# Patient Record
Sex: Male | Born: 1945
Health system: Southern US, Community
[De-identification: ages and names within clinical notes are randomized; demographics above are authoritative.]

## PROBLEM LIST (undated history)

## (undated) DIAGNOSIS — K219 Gastro-esophageal reflux disease without esophagitis: Secondary | ICD-10-CM

## (undated) DIAGNOSIS — G4730 Sleep apnea, unspecified: Secondary | ICD-10-CM

## (undated) DIAGNOSIS — D649 Anemia, unspecified: Secondary | ICD-10-CM

## (undated) DIAGNOSIS — R0600 Dyspnea, unspecified: Secondary | ICD-10-CM

## (undated) DIAGNOSIS — Z9989 Dependence on other enabling machines and devices: Secondary | ICD-10-CM

## (undated) DIAGNOSIS — I5189 Other ill-defined heart diseases: Secondary | ICD-10-CM

## (undated) DIAGNOSIS — I4819 Other persistent atrial fibrillation: Secondary | ICD-10-CM

## (undated) DIAGNOSIS — I872 Venous insufficiency (chronic) (peripheral): Secondary | ICD-10-CM

## (undated) DIAGNOSIS — I959 Hypotension, unspecified: Secondary | ICD-10-CM

## (undated) DIAGNOSIS — I10 Essential (primary) hypertension: Secondary | ICD-10-CM

## (undated) DIAGNOSIS — E662 Morbid (severe) obesity with alveolar hypoventilation: Secondary | ICD-10-CM

## (undated) DIAGNOSIS — M1990 Unspecified osteoarthritis, unspecified site: Secondary | ICD-10-CM

## (undated) HISTORY — DX: Anemia, unspecified: D64.9

## (undated) HISTORY — PX: HERNIA REPAIR: SHX51

## (undated) HISTORY — PX: COLONOSCOPY WITH ESOPHAGOGASTRODUODENOSCOPY (EGD): SHX5779

## (undated) MED FILL — Iron Sucrose Inj 20 MG/ML (Fe Equiv): INTRAVENOUS | Qty: 10 | Status: AC

---

## 1979-06-24 DIAGNOSIS — Z8719 Personal history of other diseases of the digestive system: Secondary | ICD-10-CM | POA: Insufficient documentation

## 2003-05-30 ENCOUNTER — Other Ambulatory Visit: Payer: Self-pay

## 2011-04-23 ENCOUNTER — Encounter (INDEPENDENT_AMBULATORY_CARE_PROVIDER_SITE_OTHER): Payer: 59 | Admitting: Ophthalmology

## 2011-04-23 DIAGNOSIS — H2510 Age-related nuclear cataract, unspecified eye: Secondary | ICD-10-CM

## 2011-04-23 DIAGNOSIS — H3530 Unspecified macular degeneration: Secondary | ICD-10-CM

## 2011-04-23 DIAGNOSIS — H43819 Vitreous degeneration, unspecified eye: Secondary | ICD-10-CM

## 2012-04-26 ENCOUNTER — Encounter (INDEPENDENT_AMBULATORY_CARE_PROVIDER_SITE_OTHER): Payer: 59 | Admitting: Ophthalmology

## 2014-08-25 DIAGNOSIS — M170 Bilateral primary osteoarthritis of knee: Secondary | ICD-10-CM | POA: Diagnosis not present

## 2014-08-25 DIAGNOSIS — I872 Venous insufficiency (chronic) (peripheral): Secondary | ICD-10-CM | POA: Diagnosis not present

## 2014-08-25 DIAGNOSIS — M25569 Pain in unspecified knee: Secondary | ICD-10-CM | POA: Diagnosis not present

## 2014-09-26 DIAGNOSIS — G4730 Sleep apnea, unspecified: Secondary | ICD-10-CM | POA: Diagnosis not present

## 2014-09-26 DIAGNOSIS — K210 Gastro-esophageal reflux disease with esophagitis: Secondary | ICD-10-CM | POA: Diagnosis not present

## 2014-09-26 DIAGNOSIS — M25569 Pain in unspecified knee: Secondary | ICD-10-CM | POA: Diagnosis not present

## 2014-09-26 DIAGNOSIS — M179 Osteoarthritis of knee, unspecified: Secondary | ICD-10-CM | POA: Diagnosis not present

## 2014-10-09 DIAGNOSIS — H2513 Age-related nuclear cataract, bilateral: Secondary | ICD-10-CM | POA: Diagnosis not present

## 2015-01-03 DIAGNOSIS — I8310 Varicose veins of unspecified lower extremity with inflammation: Secondary | ICD-10-CM | POA: Diagnosis not present

## 2015-01-03 DIAGNOSIS — H3530 Unspecified macular degeneration: Secondary | ICD-10-CM | POA: Diagnosis not present

## 2015-01-03 DIAGNOSIS — G4730 Sleep apnea, unspecified: Secondary | ICD-10-CM | POA: Diagnosis not present

## 2015-01-03 DIAGNOSIS — E668 Other obesity: Secondary | ICD-10-CM | POA: Diagnosis not present

## 2015-02-22 ENCOUNTER — Ambulatory Visit (INDEPENDENT_AMBULATORY_CARE_PROVIDER_SITE_OTHER): Payer: Medicare Other | Admitting: Podiatry

## 2015-02-22 ENCOUNTER — Encounter: Payer: Self-pay | Admitting: Podiatry

## 2015-02-22 VITALS — BP 109/74 | HR 89 | Resp 18

## 2015-02-22 DIAGNOSIS — M79676 Pain in unspecified toe(s): Secondary | ICD-10-CM | POA: Diagnosis not present

## 2015-02-22 DIAGNOSIS — B351 Tinea unguium: Secondary | ICD-10-CM

## 2015-02-23 ENCOUNTER — Encounter: Payer: Self-pay | Admitting: Podiatry

## 2015-02-23 NOTE — Progress Notes (Signed)
Subjective:     Patient ID: Ernest [name], male   DOB: Jul 18, 1945, 69 y.o.   MRN: [id number]  HPI Subjective: 69 y.o. presents to the office today for painful, elongated, thickened toenails which he is unable to trim himself. Denies any redness or drainage around the nails. Denies any acute changes since last appointment and no new complaints today. Denies any systemic complaints such as fevers, chills, nausea, vomiting.    Review of Systems     Objective:   Physical Exam AAO 3, NAD DP/PT pulses palpable, CRT less than 3 seconds Protective sensation intact with Simms Weinstein monofilament, Achilles tendon reflex intact.  Nails hypertrophic, dystrophic, elongated, brittle, discolored 10. There is tenderness overlying the nails 1-5 bilaterally. There is no surrounding erythema or drainage along the nail sites. No open lesions or pre-ulcerative lesions are identified. No other areas of tenderness bilateral lower extremities. No overlying edema, erythema, increased warmth. No pain with calf compression, swelling, warmth, erythema.    Assessment:     Symptomatic onychomycosis     Plan:     Treatment options including alternatives, risks, complications were discussed -Nails sharply debrided 10 without complication/bleeding. -Discussed treatment options for onychomycosis. He states that he would like to start with over-the-counter treatment. I discussed fungi-nail.  -Discussed daily foot inspection. If there are any changes, to call the office immediately.  -Follow-up in 3 months or sooner if any problems are to arise. In the meantime, encouraged to call the office with any questions, concerns, changes symptoms.  Celesta Gentile, DPM

## 2015-04-03 DIAGNOSIS — G4730 Sleep apnea, unspecified: Secondary | ICD-10-CM | POA: Diagnosis not present

## 2015-04-03 DIAGNOSIS — K210 Gastro-esophageal reflux disease with esophagitis: Secondary | ICD-10-CM | POA: Diagnosis not present

## 2015-04-03 DIAGNOSIS — E668 Other obesity: Secondary | ICD-10-CM | POA: Diagnosis not present

## 2015-04-03 DIAGNOSIS — I8310 Varicose veins of unspecified lower extremity with inflammation: Secondary | ICD-10-CM | POA: Diagnosis not present

## 2015-04-03 DIAGNOSIS — Z23 Encounter for immunization: Secondary | ICD-10-CM | POA: Diagnosis not present

## 2015-04-17 DIAGNOSIS — G4730 Sleep apnea, unspecified: Secondary | ICD-10-CM | POA: Diagnosis not present

## 2015-04-17 DIAGNOSIS — M76892 Other specified enthesopathies of left lower limb, excluding foot: Secondary | ICD-10-CM | POA: Diagnosis not present

## 2015-04-17 DIAGNOSIS — K210 Gastro-esophageal reflux disease with esophagitis: Secondary | ICD-10-CM | POA: Diagnosis not present

## 2015-04-17 DIAGNOSIS — I872 Venous insufficiency (chronic) (peripheral): Secondary | ICD-10-CM | POA: Diagnosis not present

## 2015-05-24 ENCOUNTER — Ambulatory Visit (INDEPENDENT_AMBULATORY_CARE_PROVIDER_SITE_OTHER): Payer: Medicare Other | Admitting: Podiatry

## 2015-05-24 ENCOUNTER — Encounter: Payer: Self-pay | Admitting: Podiatry

## 2015-05-24 DIAGNOSIS — M79676 Pain in unspecified toe(s): Secondary | ICD-10-CM

## 2015-05-24 DIAGNOSIS — B351 Tinea unguium: Secondary | ICD-10-CM

## 2015-05-24 NOTE — Progress Notes (Signed)
Subjective: 69 y.o. returns the office today for painful, elongated, thickened toenails which he cannot trim themself. Denies any redness or drainage around the nails. Denies any acute changes since last appointment and no new complaints today. Denies any systemic complaints such as fevers, chills, nausea, vomiting.   Objective: AAO 3, NAD DP/PT pulses palpable, CRT less than 3 seconds Nails hypertrophic, dystrophic, elongated, brittle, discolored 10. There is tenderness overlying the nails 1-5 bilaterally. There is no surrounding erythema or drainage along the nail sites. No open lesions or pre-ulcerative lesions are identified. No other areas of tenderness bilateral lower extremities. No overlying edema, erythema, increased warmth. No pain with calf compression, swelling, warmth, erythema.  Assessment: Patient presents with symptomatic onychomycosis  Plan: -Treatment options including alternatives, risks, complications were discussed -Nails sharply debrided 10 without complication/bleeding. -Discussed daily foot inspection. If there are any changes, to call the office immediately.  -Follow-up in 3 months or sooner if any problems are to arise. In the meantime, encouraged to call the office with any questions, concerns, changes symptoms.  Celesta Gentile, DPM

## 2015-05-29 DIAGNOSIS — Z0000 Encounter for general adult medical examination without abnormal findings: Secondary | ICD-10-CM | POA: Diagnosis not present

## 2015-05-29 DIAGNOSIS — I890 Lymphedema, not elsewhere classified: Secondary | ICD-10-CM | POA: Diagnosis not present

## 2015-06-01 DIAGNOSIS — I10 Essential (primary) hypertension: Secondary | ICD-10-CM | POA: Diagnosis not present

## 2015-06-01 DIAGNOSIS — E784 Other hyperlipidemia: Secondary | ICD-10-CM | POA: Diagnosis not present

## 2015-06-01 DIAGNOSIS — R5381 Other malaise: Secondary | ICD-10-CM | POA: Diagnosis not present

## 2015-09-25 DIAGNOSIS — I872 Venous insufficiency (chronic) (peripheral): Secondary | ICD-10-CM | POA: Diagnosis not present

## 2015-09-25 DIAGNOSIS — E668 Other obesity: Secondary | ICD-10-CM | POA: Diagnosis not present

## 2015-09-25 DIAGNOSIS — H3530 Unspecified macular degeneration: Secondary | ICD-10-CM | POA: Diagnosis not present

## 2015-09-26 ENCOUNTER — Telehealth: Payer: Self-pay | Admitting: Gastroenterology

## 2015-09-26 NOTE — Telephone Encounter (Signed)
Colonoscopy - patient doesn't see any blood in his stool but [facility] sent him a card for a sample and it came back as some blood in stool.

## 2015-09-26 NOTE — Telephone Encounter (Signed)
Please check insurance

## 2015-09-27 ENCOUNTER — Ambulatory Visit: Payer: Medicare Other | Admitting: Podiatry

## 2015-10-15 DIAGNOSIS — H353131 Nonexudative age-related macular degeneration, bilateral, early dry stage: Secondary | ICD-10-CM | POA: Diagnosis not present

## 2015-10-15 DIAGNOSIS — H2513 Age-related nuclear cataract, bilateral: Secondary | ICD-10-CM | POA: Diagnosis not present

## 2015-10-16 NOTE — Telephone Encounter (Signed)
LVM for pt to return my call.

## 2015-10-18 ENCOUNTER — Telehealth: Payer: Self-pay

## 2015-10-18 ENCOUNTER — Other Ambulatory Visit: Payer: Self-pay

## 2015-10-18 DIAGNOSIS — I872 Venous insufficiency (chronic) (peripheral): Secondary | ICD-10-CM | POA: Diagnosis not present

## 2015-10-18 DIAGNOSIS — Z1211 Encounter for screening for malignant neoplasm of colon: Secondary | ICD-10-CM

## 2015-10-18 DIAGNOSIS — H3530 Unspecified macular degeneration: Secondary | ICD-10-CM | POA: Diagnosis not present

## 2015-10-18 MED ORDER — PEG 3350-KCL-NABCB-NACL-NASULF 236 G PO SOLR: 4000.0000 mL | Freq: Once | ORAL | Status: DC

## 2015-10-18 NOTE — Telephone Encounter (Signed)
Gastroenterology Pre-Procedure Review  Request Date: 12/04/15 Requesting Physician: Dr. Lavera Guise  PATIENT REVIEW QUESTIONS: The patient responded to the following health history questions as indicated:    1. Are you having any GI issues? no 2. Do you have a personal history of Polyps? no 3. Do you have a family history of Colon Cancer or Polyps? no 4. Diabetes Mellitus? no 5. Joint replacements in the past 12 months?no 6. Major health problems in the past 3 months?no 7. Any artificial heart valves, MVP, or defibrillator?no    MEDICATIONS & ALLERGIES:    Patient reports the following regarding taking any anticoagulation/antiplatelet therapy:   Plavix, Coumadin, Eliquis, Xarelto, Lovenox, Pradaxa, Brilinta, or Effient? no Aspirin? no  Patient confirms/reports the following medications:  Current Outpatient Prescriptions  Medication Sig Dispense Refill  . amiloride-hydrochlorothiazide (MODURETIC) 5-50 MG tablet Take 1 tablet by mouth daily.  6  . Azelastine-Fluticasone (DYMISTA) 137-50 MCG/ACT SUSP Place into the nose.    Marland Kitchen BEE POLLEN PO Take by mouth.    . co-enzyme Q-10 30 MG capsule Take 30 mg by mouth 3 (three) times daily.    . Ginkgo Biloba Extract 60 MG CAPS Take by mouth.    Nyoka Cowden Coffee Bean-Yerba Mate (GREEN COFFEE BEAN EXTRACT PO) Take by mouth.    . Misc Natural Products (OSTEO BI-FLEX JOINT SHIELD PO) Take by mouth.    . Multiple Vitamins-Minerals (CENTRUM SILVER ADULT 50+ PO) Take by mouth.    . Multiple Vitamins-Minerals (PRESERVISION AREDS 2) CAPS Take by mouth.    . Omega-3 Fatty Acids (OMEGA 3 PO) Take by mouth.    . pantoprazole (PROTONIX) 40 MG tablet Take 40 mg by mouth daily.  5  . traMADol (ULTRAM) 50 MG tablet Take 50 mg by mouth 2 (two) times daily.  2   No current facility-administered medications for this visit.    Patient confirms/reports the following allergies:  No Known Allergies  No orders of the defined types were placed in this encounter.     AUTHORIZATION INFORMATION Primary Insurance: 1D#: Group #:  Secondary Insurance: 1D#: Group #:  SCHEDULE INFORMATION: Date: 12/04/15 Time: Location: [facility]

## 2015-10-19 DIAGNOSIS — R5381 Other malaise: Secondary | ICD-10-CM | POA: Diagnosis not present

## 2015-10-19 NOTE — Telephone Encounter (Signed)
See below

## 2015-10-23 DIAGNOSIS — I889 Nonspecific lymphadenitis, unspecified: Secondary | ICD-10-CM | POA: Diagnosis not present

## 2015-10-23 DIAGNOSIS — L03212 Acute lymphangitis of face: Secondary | ICD-10-CM | POA: Diagnosis not present

## 2015-11-02 ENCOUNTER — Telehealth: Payer: Self-pay | Admitting: Gastroenterology

## 2015-11-02 NOTE — Telephone Encounter (Signed)
Patient called and wants to cancel his colonoscopy. He did not give a reason and did not want to reschedule

## 2015-11-05 NOTE — Telephone Encounter (Signed)
Contacted Trish at End[facility] and cancelled appt.

## 2015-11-27 ENCOUNTER — Ambulatory Visit (INDEPENDENT_AMBULATORY_CARE_PROVIDER_SITE_OTHER): Payer: Medicare Other | Admitting: Sports Medicine

## 2015-11-27 ENCOUNTER — Ambulatory Visit: Payer: Medicare Other | Admitting: Podiatry

## 2015-11-27 DIAGNOSIS — B351 Tinea unguium: Secondary | ICD-10-CM

## 2015-11-27 DIAGNOSIS — M79676 Pain in unspecified toe(s): Secondary | ICD-10-CM | POA: Diagnosis not present

## 2015-11-27 DIAGNOSIS — I739 Peripheral vascular disease, unspecified: Secondary | ICD-10-CM

## 2015-11-28 NOTE — Progress Notes (Signed)
Patient ID: Ernest [name], male   DOB: 02/24/1946, 70 y.o.   MRN: [id number] Subjective: Ernest [name] is a 70 y.o. male patient seen today in office with complaint of painful thickened and elongated toenails; unable to trim. Patient denies history of Diabetes. 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 Patient has no other pedal complaints at this time.   There are no active problems to display for this patient.   Current Outpatient Prescriptions on File Prior to Visit  Medication Sig Dispense Refill  . amiloride-hydrochlorothiazide (MODURETIC) 5-50 MG tablet Take 1 tablet by mouth daily.  6  . Azelastine-Fluticasone (DYMISTA) 137-50 MCG/ACT SUSP Place into the nose.    Marland Kitchen BEE POLLEN PO Take by mouth.    . co-enzyme Q-10 30 MG capsule Take 30 mg by mouth 3 (three) times daily.    . Ginkgo Biloba Extract 60 MG CAPS Take by mouth.    Nyoka Cowden Coffee Bean-Yerba Mate (GREEN COFFEE BEAN EXTRACT PO) Take by mouth.    . Misc Natural Products (OSTEO BI-FLEX JOINT SHIELD PO) Take by mouth.    . Multiple Vitamins-Minerals (CENTRUM SILVER ADULT 50+ PO) Take by mouth.    . Multiple Vitamins-Minerals (PRESERVISION AREDS 2) CAPS Take by mouth.    . Omega-3 Fatty Acids (OMEGA 3 PO) Take by mouth.    . pantoprazole (PROTONIX) 40 MG tablet Take 40 mg by mouth daily.  5  . polyethylene glycol (GOLYTELY) 236 g solution Take 4,000 mLs by mouth once. Drink one 8 oz glass every 30 mins until stools are clear. 4000 mL 0  . traMADol (ULTRAM) 50 MG tablet Take 50 mg by mouth 2 (two) times daily.  2   No current facility-administered medications on file prior to visit.    No Known Allergies  Objective: Physical Exam  General: Well developed, nourished, no acute distress, awake, alert and oriented x 3  Vascular: Dorsalis pedis artery 1/4 bilateral, Posterior tibial artery 1/4 bilateral, skin temperature warm to warm proximal to distal bilateral lower extremities, + varicosities and  venous hyperpigmentation, +1 pitting edema bilateral, no hair present bilateral.  Neurological: Gross sensation present via light touch bilateral.   Dermatological: Skin is warm, dry, and supple bilateral, Nails 1-10 are tender, long, thick, and discolored with mild subungal debris, no webspace macerations present bilateral, no open lesions present bilateral, no callus/corns/hyperkeratotic tissue present bilateral. No signs of infection bilateral.  Musculoskeletal: No symptomatic boney deformities noted bilateral. Muscular strength within normal limits without painon range of motion. No pain with calf compression bilateral.  Assessment and Plan:  Problem List Items Addressed This Visit    None    Visit Diagnoses    Dermatophytosis of nail    -  Primary    Pain of toe, unspecified laterality        PVD (peripheral vascular disease) ([place])          -Examined patient.  -Discussed treatment options for painful mycotic nails. -Mechanically debrided and reduced mycotic nails with sterile nail nipper and dremel nail file without incident. -Continue with compression stocking and lymphedema pump -Patient to return in 3 months for follow up evaluation or sooner if symptoms worsen.  Landis Martins, DPM

## 2015-12-04 ENCOUNTER — Encounter: Admission: RE | Payer: Self-pay | Source: Ambulatory Visit

## 2015-12-04 ENCOUNTER — Ambulatory Visit: Admission: RE | Admit: 2015-12-04 | Payer: 59 | Source: Ambulatory Visit | Admitting: Gastroenterology

## 2015-12-04 SURGERY — COLONOSCOPY WITH PROPOFOL
Anesthesia: General

## 2016-02-26 DIAGNOSIS — I872 Venous insufficiency (chronic) (peripheral): Secondary | ICD-10-CM | POA: Diagnosis not present

## 2016-02-26 DIAGNOSIS — I889 Nonspecific lymphadenitis, unspecified: Secondary | ICD-10-CM | POA: Diagnosis not present

## 2016-04-07 DIAGNOSIS — L304 Erythema intertrigo: Secondary | ICD-10-CM | POA: Diagnosis not present

## 2016-04-07 DIAGNOSIS — L98491 Non-pressure chronic ulcer of skin of other sites limited to breakdown of skin: Secondary | ICD-10-CM | POA: Diagnosis not present

## 2016-05-19 DIAGNOSIS — L98491 Non-pressure chronic ulcer of skin of other sites limited to breakdown of skin: Secondary | ICD-10-CM | POA: Diagnosis not present

## 2016-05-19 DIAGNOSIS — L304 Erythema intertrigo: Secondary | ICD-10-CM | POA: Diagnosis not present

## 2016-05-29 ENCOUNTER — Encounter: Payer: Self-pay | Admitting: Podiatry

## 2016-05-29 ENCOUNTER — Ambulatory Visit (INDEPENDENT_AMBULATORY_CARE_PROVIDER_SITE_OTHER): Payer: Medicare Other | Admitting: Podiatry

## 2016-05-29 DIAGNOSIS — M79676 Pain in unspecified toe(s): Secondary | ICD-10-CM | POA: Diagnosis not present

## 2016-05-29 DIAGNOSIS — B351 Tinea unguium: Secondary | ICD-10-CM | POA: Diagnosis not present

## 2016-05-29 DIAGNOSIS — I739 Peripheral vascular disease, unspecified: Secondary | ICD-10-CM | POA: Diagnosis not present

## 2016-05-29 NOTE — Progress Notes (Signed)
Subjective: 70 y.o. returns the office today for painful, elongated, thickened toenails which he cannot trim himself. Denies any redness or drainage around the nails. Denies any acute changes since last appointment and no new complaints today. Denies any systemic complaints such as fevers, chills, nausea, vomiting.   Objective: AAO 3, NAD DP/PT pulses palpable 1/4, CRT less than 3 seconds Nails hypertrophic, dystrophic, elongated, brittle, discolored 10. There is tenderness overlying the nails 1-5 bilaterally. There is no surrounding erythema or drainage along the nail sites. No open lesions or pre-ulcerative lesions are identified. No other areas of tenderness bilateral lower extremities. No overlying edema, erythema, increased warmth. No pain with calf compression, swelling, warmth, erythema.  Assessment: Patient presents with symptomatic onychomycosis  Plan: -Treatment options including alternatives, risks, complications were discussed -Nails sharply debrided 10 without complication/bleeding. -Discussed daily foot inspection. If there are any changes, to call the office immediately.  -Follow-up in 4 months (at his request) or sooner if any problems are to arise. In the meantime, encouraged to call the office with any questions, concerns, changes symptoms.  Celesta Gentile, DPM

## 2016-06-24 DIAGNOSIS — R12 Heartburn: Secondary | ICD-10-CM | POA: Diagnosis not present

## 2016-06-24 DIAGNOSIS — I872 Venous insufficiency (chronic) (peripheral): Secondary | ICD-10-CM | POA: Diagnosis not present

## 2016-06-24 DIAGNOSIS — K210 Gastro-esophageal reflux disease with esophagitis: Secondary | ICD-10-CM | POA: Diagnosis not present

## 2016-07-01 DIAGNOSIS — L304 Erythema intertrigo: Secondary | ICD-10-CM | POA: Diagnosis not present

## 2016-07-01 DIAGNOSIS — L98499 Non-pressure chronic ulcer of skin of other sites with unspecified severity: Secondary | ICD-10-CM | POA: Diagnosis not present

## 2016-07-01 DIAGNOSIS — L820 Inflamed seborrheic keratosis: Secondary | ICD-10-CM | POA: Diagnosis not present

## 2016-07-29 DIAGNOSIS — Z950 Presence of cardiac pacemaker: Secondary | ICD-10-CM | POA: Diagnosis not present

## 2016-07-31 DIAGNOSIS — I10 Essential (primary) hypertension: Secondary | ICD-10-CM | POA: Diagnosis not present

## 2016-07-31 DIAGNOSIS — R5381 Other malaise: Secondary | ICD-10-CM | POA: Diagnosis not present

## 2016-07-31 DIAGNOSIS — E784 Other hyperlipidemia: Secondary | ICD-10-CM | POA: Diagnosis not present

## 2016-08-12 DIAGNOSIS — R12 Heartburn: Secondary | ICD-10-CM | POA: Diagnosis not present

## 2016-08-12 DIAGNOSIS — K210 Gastro-esophageal reflux disease with esophagitis: Secondary | ICD-10-CM | POA: Diagnosis not present

## 2016-08-12 DIAGNOSIS — I872 Venous insufficiency (chronic) (peripheral): Secondary | ICD-10-CM | POA: Diagnosis not present

## 2016-10-02 ENCOUNTER — Encounter (INDEPENDENT_AMBULATORY_CARE_PROVIDER_SITE_OTHER): Payer: Medicare Other | Admitting: Podiatry

## 2016-10-02 ENCOUNTER — Ambulatory Visit: Payer: Medicare Other | Admitting: Podiatry

## 2016-10-02 NOTE — Progress Notes (Signed)
Patient did not wish to sign ABN and was not seen today.  This encounter was created in error - please disregard.   Gardiner Barefoot DPM

## 2016-10-20 DIAGNOSIS — H2513 Age-related nuclear cataract, bilateral: Secondary | ICD-10-CM | POA: Diagnosis not present

## 2016-10-20 DIAGNOSIS — H524 Presbyopia: Secondary | ICD-10-CM | POA: Diagnosis not present

## 2016-10-20 DIAGNOSIS — H353131 Nonexudative age-related macular degeneration, bilateral, early dry stage: Secondary | ICD-10-CM | POA: Diagnosis not present

## 2016-10-28 DIAGNOSIS — I872 Venous insufficiency (chronic) (peripheral): Secondary | ICD-10-CM | POA: Diagnosis not present

## 2016-10-28 DIAGNOSIS — R12 Heartburn: Secondary | ICD-10-CM | POA: Diagnosis not present

## 2016-10-28 DIAGNOSIS — K210 Gastro-esophageal reflux disease with esophagitis: Secondary | ICD-10-CM | POA: Diagnosis not present

## 2017-02-24 DIAGNOSIS — I872 Venous insufficiency (chronic) (peripheral): Secondary | ICD-10-CM | POA: Diagnosis not present

## 2017-02-24 DIAGNOSIS — I889 Nonspecific lymphadenitis, unspecified: Secondary | ICD-10-CM | POA: Diagnosis not present

## 2017-02-24 DIAGNOSIS — R12 Heartburn: Secondary | ICD-10-CM | POA: Diagnosis not present

## 2017-02-24 DIAGNOSIS — K210 Gastro-esophageal reflux disease with esophagitis: Secondary | ICD-10-CM | POA: Diagnosis not present

## 2017-07-31 DIAGNOSIS — E7849 Other hyperlipidemia: Secondary | ICD-10-CM | POA: Diagnosis not present

## 2017-07-31 DIAGNOSIS — R5381 Other malaise: Secondary | ICD-10-CM | POA: Diagnosis not present

## 2017-07-31 DIAGNOSIS — I10 Essential (primary) hypertension: Secondary | ICD-10-CM | POA: Diagnosis not present

## 2017-08-04 DIAGNOSIS — Z0000 Encounter for general adult medical examination without abnormal findings: Secondary | ICD-10-CM | POA: Diagnosis not present

## 2017-12-08 DIAGNOSIS — R12 Heartburn: Secondary | ICD-10-CM | POA: Diagnosis not present

## 2017-12-08 DIAGNOSIS — I872 Venous insufficiency (chronic) (peripheral): Secondary | ICD-10-CM | POA: Diagnosis not present

## 2017-12-08 DIAGNOSIS — K210 Gastro-esophageal reflux disease with esophagitis: Secondary | ICD-10-CM | POA: Diagnosis not present

## 2017-12-08 DIAGNOSIS — I889 Nonspecific lymphadenitis, unspecified: Secondary | ICD-10-CM | POA: Diagnosis not present

## 2018-03-30 DIAGNOSIS — R12 Heartburn: Secondary | ICD-10-CM | POA: Diagnosis not present

## 2018-03-30 DIAGNOSIS — I889 Nonspecific lymphadenitis, unspecified: Secondary | ICD-10-CM | POA: Diagnosis not present

## 2018-03-30 DIAGNOSIS — Z23 Encounter for immunization: Secondary | ICD-10-CM | POA: Diagnosis not present

## 2018-03-30 DIAGNOSIS — I872 Venous insufficiency (chronic) (peripheral): Secondary | ICD-10-CM | POA: Diagnosis not present

## 2018-03-30 DIAGNOSIS — K210 Gastro-esophageal reflux disease with esophagitis: Secondary | ICD-10-CM | POA: Diagnosis not present

## 2018-05-27 DIAGNOSIS — I889 Nonspecific lymphadenitis, unspecified: Secondary | ICD-10-CM | POA: Diagnosis not present

## 2018-05-27 DIAGNOSIS — K210 Gastro-esophageal reflux disease with esophagitis: Secondary | ICD-10-CM | POA: Diagnosis not present

## 2018-05-27 DIAGNOSIS — R12 Heartburn: Secondary | ICD-10-CM | POA: Diagnosis not present

## 2018-05-27 DIAGNOSIS — Z0000 Encounter for general adult medical examination without abnormal findings: Secondary | ICD-10-CM | POA: Diagnosis not present

## 2018-05-27 DIAGNOSIS — I872 Venous insufficiency (chronic) (peripheral): Secondary | ICD-10-CM | POA: Diagnosis not present

## 2018-07-09 DIAGNOSIS — I889 Nonspecific lymphadenitis, unspecified: Secondary | ICD-10-CM | POA: Diagnosis not present

## 2018-07-09 DIAGNOSIS — I872 Venous insufficiency (chronic) (peripheral): Secondary | ICD-10-CM | POA: Diagnosis not present

## 2018-07-09 DIAGNOSIS — K210 Gastro-esophageal reflux disease with esophagitis: Secondary | ICD-10-CM | POA: Diagnosis not present

## 2019-03-04 DIAGNOSIS — Z23 Encounter for immunization: Secondary | ICD-10-CM | POA: Diagnosis not present

## 2019-08-02 DIAGNOSIS — R12 Heartburn: Secondary | ICD-10-CM | POA: Diagnosis not present

## 2019-08-02 DIAGNOSIS — I872 Venous insufficiency (chronic) (peripheral): Secondary | ICD-10-CM | POA: Diagnosis not present

## 2019-08-02 DIAGNOSIS — G4730 Sleep apnea, unspecified: Secondary | ICD-10-CM | POA: Diagnosis not present

## 2019-08-02 DIAGNOSIS — K469 Unspecified abdominal hernia without obstruction or gangrene: Secondary | ICD-10-CM | POA: Diagnosis not present

## 2019-08-19 ENCOUNTER — Ambulatory Visit: Payer: Medicare Other | Attending: Internal Medicine

## 2019-08-19 DIAGNOSIS — Z23 Encounter for immunization: Secondary | ICD-10-CM | POA: Insufficient documentation

## 2019-08-19 NOTE — Progress Notes (Signed)
   Covid-19 Vaccination Clinic  Name:  Ernest [name]    MRN: [id number] DOB: 1945-12-03  08/19/2019  Mr. Isgett was observed post Covid-19 immunization for 15 minutes without incidence. He was provided with Vaccine Information Sheet and instruction to access the V-Safe system.   Mr. Kruzan was instructed to call 911 with any severe reactions post vaccine: Marland Kitchen Difficulty breathing  . Swelling of your face and throat  . A fast heartbeat  . A bad rash all over your body  . Dizziness and weakness    Immunizations Administered    Name Date Dose VIS Date Route   Pfizer COVID-19 Vaccine 08/19/2019  1:10 PM 0.3 mL 06/03/2019 Intramuscular   Manufacturer: [place]   Lot: [id number]   Arial: [id number]

## 2019-08-22 ENCOUNTER — Other Ambulatory Visit: Payer: Self-pay

## 2019-08-22 ENCOUNTER — Emergency Department
Admission: EM | Admit: 2019-08-22 | Discharge: 2019-08-22 | Disposition: A | Discharge status: Home / Self Care | Payer: Medicare Other | Attending: Emergency Medicine | Admitting: Emergency Medicine

## 2019-08-22 ENCOUNTER — Emergency Department: Payer: Medicare Other

## 2019-08-22 DIAGNOSIS — R1110 Vomiting, unspecified: Secondary | ICD-10-CM | POA: Diagnosis not present

## 2019-08-22 DIAGNOSIS — Z79899 Other long term (current) drug therapy: Secondary | ICD-10-CM | POA: Diagnosis not present

## 2019-08-22 DIAGNOSIS — K429 Umbilical hernia without obstruction or gangrene: Secondary | ICD-10-CM | POA: Diagnosis not present

## 2019-08-22 DIAGNOSIS — R1031 Right lower quadrant pain: Secondary | ICD-10-CM | POA: Diagnosis not present

## 2019-08-22 DIAGNOSIS — R112 Nausea with vomiting, unspecified: Secondary | ICD-10-CM | POA: Diagnosis not present

## 2019-08-22 DIAGNOSIS — K469 Unspecified abdominal hernia without obstruction or gangrene: Secondary | ICD-10-CM | POA: Insufficient documentation

## 2019-08-22 DIAGNOSIS — R1033 Periumbilical pain: Secondary | ICD-10-CM | POA: Diagnosis not present

## 2019-08-22 HISTORY — DX: Sleep apnea, unspecified: G47.30

## 2019-08-22 HISTORY — DX: Venous insufficiency (chronic) (peripheral): I87.2

## 2019-08-22 LAB — LACTIC ACID, PLASMA
Lactic Acid, Venous: 3.3 mmol/L (ref 0.5–1.9)
Lactic Acid, Venous: 1.9 mmol/L (ref 0.5–1.9)

## 2019-08-22 LAB — COMPREHENSIVE METABOLIC PANEL WITH GFR
ALT: 20 U/L (ref 0–44)
AST: 29 U/L (ref 15–41)
Albumin: 4.2 g/dL (ref 3.5–5.0)
Alkaline Phosphatase: 58 U/L (ref 38–126)
Anion gap: 19 — ABNORMAL HIGH (ref 5–15)
BUN: 11 mg/dL (ref 8–23)
CO2: 23 mmol/L (ref 22–32)
Calcium: 9.7 mg/dL (ref 8.9–10.3)
Chloride: 97 mmol/L — ABNORMAL LOW (ref 98–111)
Creatinine, Ser: 0.91 mg/dL (ref 0.61–1.24)
GFR calc Af Amer: >60 mL/min
GFR calc non Af Amer: >60 mL/min
Glucose, Bld: 136 mg/dL — ABNORMAL HIGH (ref 70–99)
Potassium: 3.1 mmol/L — ABNORMAL LOW (ref 3.5–5.1)
Sodium: 139 mmol/L (ref 135–145)
Total Bilirubin: 1.1 mg/dL (ref 0.3–1.2)
Total Protein: 7.9 g/dL (ref 6.5–8.1)

## 2019-08-22 LAB — CBC
HCT: 47.3 % (ref 39.0–52.0)
Hemoglobin: 15.7 g/dL (ref 13.0–17.0)
MCH: 26.3 pg (ref 26.0–34.0)
MCHC: 33.2 g/dL (ref 30.0–36.0)
MCV: 79.1 fL — ABNORMAL LOW (ref 80.0–100.0)
Platelets: 280 10*3/uL (ref 150–400)
RBC: 5.98 MIL/uL — ABNORMAL HIGH (ref 4.22–5.81)
RDW: 14.5 % (ref 11.5–15.5)
WBC: 15.5 10*3/uL — ABNORMAL HIGH (ref 4.0–10.5)
nRBC: 0 % (ref 0.0–0.2)

## 2019-08-22 LAB — URINALYSIS, COMPLETE (UACMP) WITH MICROSCOPIC
Bacteria, UA: NONE SEEN
Bilirubin Urine: NEGATIVE
Glucose, UA: NEGATIVE mg/dL
Hgb urine dipstick: NEGATIVE
Ketones, ur: 20 mg/dL — AB
Leukocytes,Ua: NEGATIVE
Nitrite: NEGATIVE
Protein, ur: NEGATIVE mg/dL
Specific Gravity, Urine: >1.046 — ABNORMAL HIGH (ref 1.005–1.030)
pH: 6 (ref 5.0–8.0)

## 2019-08-22 LAB — LIPASE, BLOOD: Lipase: 35 U/L (ref 11–51)

## 2019-08-22 MED ORDER — IOHEXOL 300 MG/ML  SOLN
125.0000 mL | Freq: Once | INTRAMUSCULAR | Status: AC | PRN
  Administered 2019-08-22: 125 mL via INTRAVENOUS

## 2019-08-22 MED ORDER — SODIUM CHLORIDE 0.9 % IV BOLUS
1000.0000 mL | Freq: Once | INTRAVENOUS | Status: AC
  Administered 2019-08-22: 1000 mL via INTRAVENOUS

## 2019-08-22 MED ORDER — HYDROMORPHONE HCL 1 MG/ML IJ SOLN
1.0000 mg | Freq: Once | INTRAMUSCULAR | Status: AC
  Administered 2019-08-22: 1 mg via INTRAVENOUS
  Filled 2019-08-22: qty 1

## 2019-08-22 MED ORDER — SODIUM CHLORIDE 0.9% FLUSH: 3.0000 mL | Freq: Once | INTRAVENOUS | Status: DC

## 2019-08-22 MED ORDER — ONDANSETRON HCL 4 MG/2ML IJ SOLN
4.0000 mg | Freq: Once | INTRAMUSCULAR | Status: AC
  Administered 2019-08-22: 16:00:00 4 mg via INTRAVENOUS
  Filled 2019-08-22: qty 2

## 2019-08-22 NOTE — ED Provider Notes (Signed)
[facility] Emergency Department Provider Note ____________________________________________   First MD Initiated Contact with Patient 08/22/19 1514     (approximate)  I have reviewed the triage vital signs and the nursing notes.   HISTORY  Chief Complaint Hernia    HPI Ernest [name] is a 74 y.o. male with PMH as noted below as well as a history of a large right lower quadrant periumbilical area hernia presents with acute onset of worsened pain to the hernia since around 10 AM this morning, associated with somewhat of an increase in size of the hernia and increased firmness.  The patient reports nausea and several episodes of vomiting.  He states he did have 1 bowel movement around the time the pain started but has not been passing gas.  The patient states that he was actually scheduled to see a surgeon in 3 days to discuss possible repair.   Past Medical History:  Diagnosis Date  . Chronic venous insufficiency of lower extremity   . Sleep apnea     There are no problems to display for this patient.   Past Surgical History:  Procedure Laterality Date  . HERNIA REPAIR     inguinal and umbilical    Prior to Admission medications   Medication Sig Start Date End Date Taking? Authorizing Provider  amiloride-hydrochlorothiazide (MODURETIC) 5-50 MG tablet Take 1 tablet by mouth daily. 11/22/14   [provider]  Azelastine-Fluticasone (DYMISTA) 137-50 MCG/ACT SUSP Place into the nose.    [provider]  BEE POLLEN PO Take by mouth.    [provider]  co-enzyme Q-10 30 MG capsule Take 30 mg by mouth 3 (three) times daily.    [provider]  Ginkgo Biloba Extract 60 MG CAPS Take by mouth.    [provider]  Nyoka Cowden Coffee Bean-Yerba Mate (GREEN COFFEE BEAN EXTRACT PO) Take by mouth.    [provider]  Misc Natural Products (OSTEO BI-FLEX JOINT SHIELD PO) Take by mouth.    [provider]   Multiple Vitamins-Minerals (CENTRUM SILVER ADULT 50+ PO) Take by mouth.    [provider]  Multiple Vitamins-Minerals (PRESERVISION AREDS 2) CAPS Take by mouth.    [provider]  Omega-3 Fatty Acids (OMEGA 3 PO) Take by mouth.    [provider]  pantoprazole (PROTONIX) 40 MG tablet Take 40 mg by mouth daily. 11/22/14   [provider]  polyethylene glycol (GOLYTELY) 236 g solution Take 4,000 mLs by mouth once. Drink one 8 oz glass every 30 mins until stools are clear. 10/18/15   Lucilla Lame, MD  traMADol (ULTRAM) 50 MG tablet Take 50 mg by mouth 2 (two) times daily. 12/14/14   [provider]    Allergies Patient has no known allergies.  No family history on file.  Social History Social History   Tobacco Use  . Smoking status: Never Smoker  . Smokeless tobacco: Never Used  Substance Use Topics  . Alcohol use: Not Currently    Alcohol/week: 0.0 standard drinks  . Drug use: Not Currently    Review of Systems  Constitutional: No fever. Eyes: No redness. ENT: No sore throat. Cardiovascular: Denies chest pain. Respiratory: Denies shortness of breath. Gastrointestinal: Positive for nausea and vomiting. Genitourinary: Negative for flank pain.  Musculoskeletal: Negative for back pain. Skin: Negative for rash. Neurological: Negative for headache.   ____________________________________________   PHYSICAL EXAM:  VITAL SIGNS: ED Triage Vitals [08/22/19 1407]  Enc Vitals Group  BP 125/60     Pulse Rate 66     Resp 18     Temp 97.8 F (36.6 C)     Temp Source Oral     SpO2 99 %     Weight (!) 400 lb (181.4 kg)     Height 6\' 1"  (1.854 m)     Head Circumference      Peak Flow      Pain Score 9     Pain Loc      Pain Edu?      Excl. in [place]?     Constitutional: Alert and oriented.  Uncomfortable appearing but in no acute distress. Eyes: Conjunctivae are normal.  No scleral icterus. Head: Atraumatic. Nose: No  congestion/rhinnorhea. Mouth/Throat: Mucous membranes are moist.   Neck: Normal range of motion.  Cardiovascular: Normal rate, regular rhythm.   Good peripheral circulation. Respiratory: Normal respiratory effort.  No retractions.  Gastrointestinal: Large hernia in the right lower quadrant adjacent to the umbilicus.  Moderately tender. Genitourinary: No flank tenderness. Musculoskeletal: Extremities warm and well perfused.  Neurologic:  Normal speech and language. No gross focal neurologic deficits are appreciated.  Skin:  Skin is warm and dry. No rash noted. Psychiatric: Mood and affect are normal. Speech and behavior are normal.  ____________________________________________   LABS (all labs ordered are listed, but only abnormal results are displayed)  Labs Reviewed  COMPREHENSIVE METABOLIC PANEL - Abnormal; Notable for the following components:      Result Value   Potassium 3.1 (*)    Chloride 97 (*)    Glucose, Bld 136 (*)    Anion gap 19 (*)    All other components within normal limits  CBC - Abnormal; Notable for the following components:   WBC 15.5 (*)    RBC 5.98 (*)    MCV 79.1 (*)    All other components within normal limits  LACTIC ACID, PLASMA - Abnormal; Notable for the following components:   Lactic Acid, Venous 3.3 (*)    All other components within normal limits  LIPASE, BLOOD  LACTIC ACID, PLASMA  URINALYSIS, COMPLETE (UACMP) WITH MICROSCOPIC   ____________________________________________  EKG   ____________________________________________  RADIOLOGY  CT abdomen/pelvis: Infraumbilical hernia containing multiple bowel loops.  Bowel proximal to the hernia with slight distention and air-fluid levels concerning for possible early obstruction.  ____________________________________________   PROCEDURES  Procedure(s) performed: No  Procedures  Critical Care performed: No ____________________________________________   INITIAL IMPRESSION /  ASSESSMENT AND PLAN / ED COURSE  Pertinent labs & imaging results that were available during my care of the patient were reviewed by me and considered in my medical decision making (see chart for details).  74 year old male with PMH as noted above presents with increased pain and swelling to a large hernia in his right lower abdomen adjacent to the umbilicus.  The patient states that the hernia is always significantly protruding, but has come out even more and is firm.  He has had some nausea and vomiting as well.  On exam, the patient is uncomfortable appearing.  His vital signs are normal.  There is a large hernia evident on exam that is firm and tender.  It is not reducible, however the patient states that it is not really reducible at baseline and is always protruding significantly..   Presentation is concerning for possible incarceration or strangulation.  We will give the patient analgesia and I will attempt to reduce the hernia back to its baseline.  The  lab work-up is significant for an elevated WBC count which is nonspecific, and I have added on a lactate.  We will plan for CT of the abdomen to further evaluate.  ----------------------------------------- 5:52 PM on 08/22/2019 -----------------------------------------  CT showed some pelvis proximal to the hernia which appeared slightly distended with air-fluid levels, concerning for possible early obstruction.  However, the patient has complete resolution of his symptoms after 1 dose of Dilaudid, and has had no vomiting in the ED.I consulted Dr. Peyton Najjar from general surgery who evaluated the patient.  He advises that clinically at this time there is no evidence of active obstruction, and he does not recommend acute surgical intervention.  However, in addition to the previously seen elevated WBC count, the patient's lactic acid is also elevated.  Since otherwise his symptoms have improved, I have a low suspicion that this could be related to  an obstruction or any bowel ischemia.  The patient also has normal vital signs and clinically there is no evidence of sepsis.  I suspect that the elevated lactate could be more related to vomiting and dehydration especially as his BMP shows an anion gap.  The patient states that he feels much better and wants to go home.  However, given the elevated lactate, I recommended to the patient that we give fluids, obtain a repeat lactate, and observe him at least for a few more hours.  If the lactate clears after fluids and he continues to have no significant pain or recurrent symptoms even after the Dilaudid has worn off, I think will be reasonable to send him home.  However, the lactate continues to be elevated or he has recurrent symptoms he may require further management.  ----------------------------------------- 7:37 PM on 08/22/2019 -----------------------------------------  Repeat lactate after fluids is normal.  The patient has not had any recurrence of his abdominal pain, has no nausea, and has not vomited.  He is tolerating p.o.  He has not yet provided a urine sample, but denies any urinary symptoms.  He continues to express a strong desire to go home as soon as possible.    At this time, given that he has had no further pain or vomiting, is tolerating p.o., and has normalized lactate, as well as the input from Dr. Peyton Najjar, my clinical suspicion for incarcerated hernia or active small bowel obstruction is very low.  Although the anion gap was elevated, I also suspect that this is due to vomiting and dehydration, and since the patient is now tolerating p.o. there is no indication to keep him here longer to repeat this.  I had an extensive discussion with the patient about the work-up and the abnormalities seen, as well as the unlikely possibility of early small bowel obstruction and the risks of possibly life threatening bowel injury.  He expresses understanding and is able to paraphrase this back to  me.  He demonstrates appropriate decision-making capacity.  He states he feels well and will monitor his symptoms closely.  I gave him thorough return precautions and specifically instructed him to return if he had new, worsening, recurrent pain, swelling or firmness, vomiting, or other acute or recurrent symptoms.  He has follow-up with a surgeon already arranged 3 days from now.  He is stable for discharge home at this time.  ____________________________________________   FINAL CLINICAL IMPRESSION(S) / ED DIAGNOSES  Final diagnoses:  Abdominal hernia without obstruction and without gangrene, recurrence not specified, unspecified hernia type      NEW MEDICATIONS STARTED DURING THIS  VISIT:  New Prescriptions   No medications on file     Note:  This document was prepared using Dragon voice recognition software and may include unintentional dictation errors.    Arta Silence, MD 08/22/19 1944

## 2019-08-22 NOTE — ED Triage Notes (Signed)
Pt states he has an appt with bariatric surgeon for the abd hernia he has, but it popped out today and hasn't gone back down, having a lot of pain with N/V.Marland Kitchen

## 2019-08-22 NOTE — Consult Note (Signed)
SURGICAL CONSULTATION NOTE   HISTORY OF PRESENT ILLNESS (HPI):  74 y.o. male presented to [facility] ED for evaluation of abdominal pain. Patient reports having abdominal pain this morning.  The pain around the periumbilical area.  Pain does not radiate to other part of body.  There has been no aggravating factor.  Alleviating factor was pain medication given at the ED.  He endorses nausea and vomiting this morning.  Upon my evaluation the patient reports feeling well.  He denies any abdominal pain.  He reports that the pain is completely gone.  He denies any nausea or vomiting.  He reports he is passing flatus.  At the ED the patient had CT scan of the abdomen and pelvis.  CT scan shows a large umbilical hernia with a large amount of small bowel inside the hernia.  There is few small bowel loops distended with concern of partial small bowel obstruction.  There is no free air or free fluid.  I personally evaluated the images.  The patient reported that he he has an appointment for umbilical hernia evaluation by bariatric surgeon this week.  He understand that he has to take care of this hernia by a specialized surgeon and he made an appointment with the bariatric surgeon at [facility].  At the moment of my evaluation the ED at 5:20 PM the patient has no abdominal pain, no nausea or vomiting.  Surgery is consulted by Dr. Cherylann Banas in this context for evaluation and management of CT scan finding of suspected bowel obstruction.  PAST MEDICAL HISTORY (PMH):  Past Medical History:  Diagnosis Date  . Chronic venous insufficiency of lower extremity   . Sleep apnea      PAST SURGICAL HISTORY [facility]):  Past Surgical History:  Procedure Laterality Date  . HERNIA REPAIR     inguinal and umbilical     MEDICATIONS:  Prior to Admission medications   Medication Sig Start Date End Date Taking? Authorizing Provider  amiloride-hydrochlorothiazide (MODURETIC) 5-50 MG tablet Take 1 tablet by mouth daily.  11/22/14   [provider]  Azelastine-Fluticasone (DYMISTA) 137-50 MCG/ACT SUSP Place into the nose.    [provider]  BEE POLLEN PO Take by mouth.    [provider]  co-enzyme Q-10 30 MG capsule Take 30 mg by mouth 3 (three) times daily.    [provider]  Ginkgo Biloba Extract 60 MG CAPS Take by mouth.    [provider]  Nyoka Cowden Coffee Bean-Yerba Mate (GREEN COFFEE BEAN EXTRACT PO) Take by mouth.    [provider]  Misc Natural Products (OSTEO BI-FLEX JOINT SHIELD PO) Take by mouth.    [provider]  Multiple Vitamins-Minerals (CENTRUM SILVER ADULT 50+ PO) Take by mouth.    [provider]  Multiple Vitamins-Minerals (PRESERVISION AREDS 2) CAPS Take by mouth.    [provider]  Omega-3 Fatty Acids (OMEGA 3 PO) Take by mouth.    [provider]  pantoprazole (PROTONIX) 40 MG tablet Take 40 mg by mouth daily. 11/22/14   [provider]  polyethylene glycol (GOLYTELY) 236 g solution Take 4,000 mLs by mouth once. Drink one 8 oz glass every 30 mins until stools are clear. 10/18/15   Lucilla Lame, MD  traMADol (ULTRAM) 50 MG tablet Take 50 mg by mouth 2 (two) times daily. 12/14/14   [provider]     ALLERGIES:  No Known Allergies   SOCIAL HISTORY:  Social History   Socioeconomic History  .  Marital status: Married    Spouse name: Not on file  . Number of children: Not on file  . Years of education: Not on file  . Highest education level: Not on file  Occupational History  . Not on file  Tobacco Use  . Smoking status: Never Smoker  . Smokeless tobacco: Never Used  Substance and Sexual Activity  . Alcohol use: Not Currently    Alcohol/week: 0.0 standard drinks  . Drug use: Not Currently  . Sexual activity: Not on file  Other Topics Concern  . Not on file  Social History Narrative  . Not on file   Social Determinants of Health   Financial Resource Strain:   .  Difficulty of Paying Living Expenses: Not on file  Food Insecurity:   . Worried About Charity fundraiser in the Last Year: Not on file  . Ran Out of Food in the Last Year: Not on file  Transportation Needs:   . Lack of Transportation (Medical): Not on file  . Lack of Transportation (Non-Medical): Not on file  Physical Activity:   . Days of Exercise per Week: Not on file  . Minutes of Exercise per Session: Not on file  Stress:   . Feeling of Stress : Not on file  Social Connections:   . Frequency of Communication with Friends and Family: Not on file  . Frequency of Social Gatherings with Friends and Family: Not on file  . Attends Religious Services: Not on file  . Active Member of Clubs or Organizations: Not on file  . Attends Archivist Meetings: Not on file  . Marital Status: Not on file  Intimate Partner Violence:   . Fear of Current or Ex-Partner: Not on file  . Emotionally Abused: Not on file  . Physically Abused: Not on file  . Sexually Abused: Not on file    The patient currently resides (home / rehab facility / nursing home): Home The patient normally is (ambulatory / bedbound): Ambulatory   FAMILY HISTORY:  No family history on file.   REVIEW OF SYSTEMS:  Constitutional: denies weight loss, fever, chills, or sweats  Eyes: denies any other vision changes, history of eye injury  ENT: denies sore throat, hearing problems  Respiratory: denies shortness of breath, wheezing  Cardiovascular: denies chest pain, palpitations  Gastrointestinal: Positive for abdominal pain, N/V this morning but not present during my evaluation. Genitourinary: denies burning with urination or urinary frequency Musculoskeletal: denies any other joint pains or cramps  Skin: denies any other rashes or skin discolorations  Neurological: denies any other headache, dizziness, weakness  Psychiatric: denies any other depression, anxiety   All other review of systems were negative   VITAL  SIGNS:  Temp:  [97.8 F (36.6 C)] 97.8 F (36.6 C) (03/01 1407) Pulse Rate:  [66-88] 87 (03/01 1629) Resp:  [13-18] 13 (03/01 1629) BP: (104-125)/(60-66) 104/66 (03/01 1615) SpO2:  [82 %-99 %] 95 % (03/01 1629) Weight:  [181.4 kg] 181.4 kg (03/01 1407)     Height: 6\' 1"  (185.4 cm) Weight: (!) 181.4 kg BMI (Calculated): 52.79   PHYSICAL EXAM:  Constitutional:  -- Normal body habitus  -- Awake, alert, and oriented x3  Eyes:  -- Pupils equally round and reactive to light  -- No scleral icterus  Ear, nose, and throat:  -- No jugular venous distension  Pulmonary:  -- No crackles  -- Equal breath sounds bilaterally -- Breathing non-labored at rest Cardiovascular:  -- S1, S2 present  --  No pericardial rubs Gastrointestinal:  -- Abdomen soft, nontender, non-distended, no guarding or rebound tenderness --Large umbilical hernia, soft, reducible, minimally tender upon reduction. -- No abdominal masses appreciated, pulsatile or otherwise  Musculoskeletal and Integumentary:  -- Wounds or skin discoloration: None appreciated -- Extremities: B/L UE and LE FROM, hands and feet warm, no edema  Neurologic:  -- Motor function: intact and symmetric -- Sensation: intact and symmetric   Labs:  CBC Latest Ref Rng & Units 08/22/2019  WBC 4.0 - 10.5 K/uL 15.5(H)  Hemoglobin 13.0 - 17.0 g/dL 15.7  Hematocrit 39.0 - 52.0 % 47.3  Platelets 150 - 400 K/uL 280   CMP Latest Ref Rng & Units 08/22/2019  Glucose 70 - 99 mg/dL 136(H)  BUN 8 - 23 mg/dL 11  Creatinine 0.61 - 1.24 mg/dL 0.91  Sodium 135 - 145 mmol/L 139  Potassium 3.5 - 5.1 mmol/L 3.1(L)  Chloride 98 - 111 mmol/L 97(L)  CO2 22 - 32 mmol/L 23  Calcium 8.9 - 10.3 mg/dL 9.7  Total Protein 6.5 - 8.1 g/dL 7.9  Total Bilirubin 0.3 - 1.2 mg/dL 1.1  Alkaline Phos 38 - 126 U/L 58  AST 15 - 41 U/L 29  ALT 0 - 44 U/L 20    Assessment/Plan:  74 y.o. male with large umbilical hernia, complicated by pertinent comorbidities including morbid  obesity with BMI of 53, sleep apnea, hypertension.  Patient has a clinical scenario of abdominal pain this morning.  Upon arrival to the ED the patient had pain medication given.  At the moment of my evaluation the patient with no abdominal pain, no nausea no vomiting.  I was able to reduce the hernia.  There is a very complex hernia in a patient with a BMI of 53.  I had a long discussion with the patient about the risk of having any surgical intervention.  At this moment the patient does not have any any indication for surgical intervention and there is no further management for the CT scan finding of suspected small bowel obstruction.  I think that this is a chronic issue that the patient is having.  The patient reported that he has the periumbilical pain very frequently.  When I reduced the hernia that he reported that it was sore, he basically said that it is like that all the time.  The patient intelligently this is reassured about who is the best to take care of him regarding his umbilical hernia and he has had an appointment already set at [facility] weight loss clinic.  This is definitely the best option for this patient.  In this hospital we will have the surgical expertise neither the multidisciplinary team today care of this complicated patient.  At this moment there is no sign of peritonitis, no abdominal pain, no nausea, no vomiting and no indication for any further management for small bowel obstruction.  Case was discussed with ED physician.  Arnold Long, MD

## 2019-08-22 NOTE — Discharge Instructions (Addendum)
Follow-up with the surgeon on Thursday as planned.  Return to the ER immediately for new, worsening, or recurrent pain, nausea or vomiting, fever, weakness, or any other new or worsening symptoms that concern you.

## 2019-08-25 DIAGNOSIS — K8020 Calculus of gallbladder without cholecystitis without obstruction: Secondary | ICD-10-CM | POA: Diagnosis not present

## 2019-08-25 DIAGNOSIS — G8918 Other acute postprocedural pain: Secondary | ICD-10-CM | POA: Diagnosis not present

## 2019-08-25 DIAGNOSIS — K219 Gastro-esophageal reflux disease without esophagitis: Secondary | ICD-10-CM | POA: Diagnosis not present

## 2019-08-25 DIAGNOSIS — I872 Venous insufficiency (chronic) (peripheral): Secondary | ICD-10-CM | POA: Diagnosis not present

## 2019-08-25 DIAGNOSIS — I10 Essential (primary) hypertension: Secondary | ICD-10-CM | POA: Diagnosis not present

## 2019-08-25 DIAGNOSIS — G4730 Sleep apnea, unspecified: Secondary | ICD-10-CM | POA: Diagnosis not present

## 2019-08-25 DIAGNOSIS — Z20822 Contact with and (suspected) exposure to covid-19: Secondary | ICD-10-CM | POA: Diagnosis not present

## 2019-08-25 DIAGNOSIS — K439 Ventral hernia without obstruction or gangrene: Secondary | ICD-10-CM | POA: Diagnosis not present

## 2019-08-25 DIAGNOSIS — K430 Incisional hernia with obstruction, without gangrene: Secondary | ICD-10-CM | POA: Diagnosis not present

## 2019-08-27 DIAGNOSIS — G8918 Other acute postprocedural pain: Secondary | ICD-10-CM | POA: Diagnosis not present

## 2019-09-13 ENCOUNTER — Ambulatory Visit: Payer: Medicare Other | Attending: Internal Medicine

## 2019-09-13 DIAGNOSIS — Z23 Encounter for immunization: Secondary | ICD-10-CM

## 2019-09-13 NOTE — Progress Notes (Signed)
   Covid-19 Vaccination Clinic  Name:  Ernest [name]    MRN: [id number] DOB: October 16, 1945  09/13/2019  Ernest [name] was observed post Covid-19 immunization for 15 minutes without incident. He was provided with Vaccine Information Sheet and instruction to access the V-Safe system.   Ernest [name] was instructed to call 911 with any severe reactions post vaccine: Marland Kitchen Difficulty breathing  . Swelling of face and throat  . A fast heartbeat  . A bad rash all over body  . Dizziness and weakness   Immunizations Administered    Name Date Dose VIS Date Route   Pfizer COVID-19 Vaccine 09/13/2019  4:01 PM 0.3 mL 06/03/2019 Intramuscular   Manufacturer: [place]   Lot: [id number]   [place]: [id number]

## 2019-09-29 DIAGNOSIS — Z48815 Encounter for surgical aftercare following surgery on the digestive system: Secondary | ICD-10-CM | POA: Diagnosis not present

## 2019-11-10 DIAGNOSIS — Z48815 Encounter for surgical aftercare following surgery on the digestive system: Secondary | ICD-10-CM | POA: Diagnosis not present

## 2020-02-22 DIAGNOSIS — H353221 Exudative age-related macular degeneration, left eye, with active choroidal neovascularization: Secondary | ICD-10-CM | POA: Diagnosis not present

## 2020-03-13 DIAGNOSIS — H2513 Age-related nuclear cataract, bilateral: Secondary | ICD-10-CM | POA: Diagnosis not present

## 2020-04-10 DIAGNOSIS — H353221 Exudative age-related macular degeneration, left eye, with active choroidal neovascularization: Secondary | ICD-10-CM | POA: Diagnosis not present

## 2020-05-24 ENCOUNTER — Other Ambulatory Visit: Payer: Self-pay | Admitting: Internal Medicine

## 2020-05-29 DIAGNOSIS — H353221 Exudative age-related macular degeneration, left eye, with active choroidal neovascularization: Secondary | ICD-10-CM | POA: Diagnosis not present

## 2020-06-04 ENCOUNTER — Other Ambulatory Visit: Payer: Self-pay | Admitting: *Deleted

## 2020-06-04 MED ORDER — NYSTATIN 100000 UNIT/GM EX POWD: 1.0000 "application " | Freq: Three times a day (TID) | CUTANEOUS | 6 refills | Status: DC

## 2020-06-06 DIAGNOSIS — H2513 Age-related nuclear cataract, bilateral: Secondary | ICD-10-CM | POA: Diagnosis not present

## 2020-06-23 DIAGNOSIS — K56609 Unspecified intestinal obstruction, unspecified as to partial versus complete obstruction: Secondary | ICD-10-CM

## 2020-06-23 HISTORY — DX: Unspecified intestinal obstruction, unspecified as to partial versus complete obstruction: K56.609

## 2020-06-25 ENCOUNTER — Ambulatory Visit (INDEPENDENT_AMBULATORY_CARE_PROVIDER_SITE_OTHER): Payer: Medicare Other | Admitting: Internal Medicine

## 2020-06-25 ENCOUNTER — Encounter: Payer: Self-pay | Admitting: Internal Medicine

## 2020-06-25 ENCOUNTER — Other Ambulatory Visit: Payer: Self-pay

## 2020-06-25 VITALS — BP 125/84 | HR 124 | Ht 73.0 in | Wt 391.2 lb

## 2020-06-25 DIAGNOSIS — I10 Essential (primary) hypertension: Secondary | ICD-10-CM

## 2020-06-25 DIAGNOSIS — R109 Unspecified abdominal pain: Secondary | ICD-10-CM | POA: Diagnosis not present

## 2020-06-25 DIAGNOSIS — I471 Supraventricular tachycardia: Secondary | ICD-10-CM | POA: Diagnosis not present

## 2020-06-25 DIAGNOSIS — I4891 Unspecified atrial fibrillation: Secondary | ICD-10-CM | POA: Insufficient documentation

## 2020-06-25 LAB — CBC WITH DIFFERENTIAL/PLATELET
Absolute Monocytes: 1174 cells/uL — ABNORMAL HIGH (ref 200–950)
Basophils Absolute: 23 cells/uL (ref 0–200)
Basophils Relative: 0.2 %
Eosinophils Absolute: 0 cells/uL — ABNORMAL LOW (ref 15–500)
Eosinophils Relative: 0 %
HCT: 45.8 % (ref 38.5–50.0)
Hemoglobin: 15.3 g/dL (ref 13.2–17.1)
Lymphs Abs: 2964 cells/uL (ref 850–3900)
MCH: 26.5 pg — ABNORMAL LOW (ref 27.0–33.0)
MCHC: 33.4 g/dL (ref 32.0–36.0)
MCV: 79.2 fL — ABNORMAL LOW (ref 80.0–100.0)
MPV: 10.3 fL (ref 7.5–12.5)
Monocytes Relative: 10.3 %
Neutro Abs: 7239 cells/uL (ref 1500–7800)
Neutrophils Relative %: 63.5 %
Platelets: 254 10*3/uL (ref 140–400)
RBC: 5.78 10*6/uL (ref 4.20–5.80)
RDW: 14.4 % (ref 11.0–15.0)
Total Lymphocyte: 26 %
WBC: 11.4 10*3/uL — ABNORMAL HIGH (ref 3.8–10.8)

## 2020-06-25 LAB — COMPLETE METABOLIC PANEL WITH GFR
AG Ratio: 1.4 (calc) (ref 1.0–2.5)
ALT: 9 U/L (ref 9–46)
AST: 15 U/L (ref 10–35)
Albumin: 3.5 g/dL — ABNORMAL LOW (ref 3.6–5.1)
Alkaline phosphatase (APISO): 49 U/L (ref 35–144)
BUN: 12 mg/dL (ref 7–25)
CO2: 28 mmol/L (ref 20–32)
Calcium: 8.6 mg/dL (ref 8.6–10.3)
Chloride: 102 mmol/L (ref 98–110)
Creat: 0.83 mg/dL (ref 0.70–1.18)
GFR, Est African American: 100 mL/min/{1.73_m2} (ref >=60)
GFR, Est Non African American: 87 mL/min/{1.73_m2} (ref >=60)
Globulin: 2.5 g/dL (calc) (ref 1.9–3.7)
Glucose, Bld: 91 mg/dL (ref 65–99)
Potassium: 4 mmol/L (ref 3.5–5.3)
Sodium: 139 mmol/L (ref 135–146)
Total Bilirubin: 0.4 mg/dL (ref 0.2–1.2)
Total Protein: 6 g/dL — ABNORMAL LOW (ref 6.1–8.1)

## 2020-06-25 NOTE — Progress Notes (Addendum)
Established Patient Office Visit  Subjective:  Patient ID: Ernest [name], male    DOB: May 27, 1946  Age: 75 y.o. MRN: [id number]  CC:  Chief Complaint  Patient presents with  . Abdominal Pain    Patient states he had severe abdominal pain for 2 days prior to Christmas    Abdominal Pain This is a new problem. The current episode started yesterday. The onset quality is sudden. The pain is located in the epigastric region. The pain is moderate. The quality of the pain is cramping. The abdominal pain radiates to the epigastric region. Associated symptoms include belching. Pertinent negatives include no diarrhea, dysuria, fever, flatus, headaches, hematochezia or hematuria. Nothing aggravates the pain. The pain is relieved by passing flatus. There is no history of abdominal surgery, colon cancer, Crohn's disease, gallstones, GERD, irritable bowel syndrome, PUD or ulcerative colitis.    Ernest [name] presents for abdominal pain  Past Medical History:  Diagnosis Date  . Chronic venous insufficiency of lower extremity   . Sleep apnea     Past Surgical History:  Procedure Laterality Date  . HERNIA REPAIR     inguinal and umbilical    History reviewed. No pertinent family history.  Social History   Socioeconomic History  . Marital status: Married    Spouse name: Not on file  . Number of children: Not on file  . Years of education: Not on file  . Highest education level: Not on file  Occupational History  . Not on file  Tobacco Use  . Smoking status: Never Smoker  . Smokeless tobacco: Never Used  Substance and Sexual Activity  . Alcohol use: Not Currently    Alcohol/week: 0.0 standard drinks  . Drug use: Not Currently  . Sexual activity: Not on file  Other Topics Concern  . Not on file  Social History Narrative  . Not on file   Social Determinants of Health   Financial Resource Strain: Not on file  Food Insecurity: Not on file  Transportation Needs: Not on  file  Physical Activity: Not on file  Stress: Not on file  Social Connections: Not on file  Intimate Partner Violence: Not on file     Current Outpatient Medications:  .  amiloride-hydrochlorothiazide (MODURETIC) 5-50 MG tablet, Take 1 tablet by mouth daily., Disp: , Rfl: 6 .  Azelastine-Fluticasone (DYMISTA) 137-50 MCG/ACT SUSP, Place into the nose., Disp: , Rfl:  .  BEE POLLEN PO, Take by mouth., Disp: , Rfl:  .  co-enzyme Q-10 30 MG capsule, Take 30 mg by mouth 3 (three) times daily., Disp: , Rfl:  .  Ginkgo Biloba Extract 60 MG CAPS, Take by mouth., Disp: , Rfl:  .  Green Coffee Bean-Yerba Mate (GREEN COFFEE BEAN EXTRACT PO), Take by mouth., Disp: , Rfl:  .  Misc Natural Products (OSTEO BI-FLEX JOINT SHIELD PO), Take by mouth., Disp: , Rfl:  .  Multiple Vitamins-Minerals (CENTRUM SILVER ADULT 50+ PO), Take by mouth., Disp: , Rfl:  .  Multiple Vitamins-Minerals (PRESERVISION AREDS 2) CAPS, Take by mouth., Disp: , Rfl:  .  nystatin (MYCOSTATIN/NYSTOP) powder, Apply 1 application topically 3 (three) times daily. Use after every shower to prevent rash, Disp: 30 g, Rfl: 6 .  Omega-3 Fatty Acids (OMEGA 3 PO), Take by mouth., Disp: , Rfl:  .  pantoprazole (PROTONIX) 40 MG tablet, TAKE 1 TABLET BY MOUTH EVERY DAY, Disp: 90 tablet, Rfl: 3 .  polyethylene glycol (GOLYTELY) 236 g solution, Take 4,000  mLs by mouth once. Drink one 8 oz glass every 30 mins until stools are clear., Disp: 4000 mL, Rfl: 0 .  traMADol (ULTRAM) 50 MG tablet, Take 50 mg by mouth 2 (two) times daily., Disp: , Rfl: 2   No Known Allergies  ROS Review of Systems  Constitutional: Negative for fever.  Gastrointestinal: Positive for abdominal pain. Negative for diarrhea, flatus and hematochezia.  Genitourinary: Negative for dysuria and hematuria.  Neurological: Negative for headaches.      Objective:    Physical Exam Vitals reviewed.  Constitutional:      Appearance: Normal appearance.  HENT:     Mouth/Throat:      Mouth: Mucous membranes are moist.  Eyes:     Pupils: Pupils are equal, round, and reactive to light.  Neck:     Vascular: No carotid bruit.  Cardiovascular:     Rate and Rhythm: Normal rate and regular rhythm.     Pulses: Normal pulses.     Heart sounds: Normal heart sounds.  Pulmonary:     Effort: Pulmonary effort is normal.     Breath sounds: Normal breath sounds.  Abdominal:     General: Bowel sounds are normal.     Palpations: Abdomen is soft. There is no hepatomegaly, splenomegaly or mass.     Tenderness: There is no abdominal tenderness.     Hernia: No hernia is present.  Musculoskeletal:     Cervical back: Neck supple.     Right lower leg: No edema.     Left lower leg: No edema.  Skin:    Findings: No rash.  Neurological:     Mental Status: He is alert and oriented to person, place, and time.     Motor: No weakness.  Psychiatric:        Mood and Affect: Mood normal.        Behavior: Behavior normal.     BP 125/84   Pulse (!) 124   Ht 6\' 1"  (1.854 m)   Wt (!) 391 lb 3.2 oz (177.4 kg)   BMI 51.61 kg/m  Wt Readings from Last 3 Encounters:  06/25/20 (!) 391 lb 3.2 oz (177.4 kg)  08/22/19 (!) 400 lb (181.4 kg)     Health Maintenance Due  Topic Date Due  . Hepatitis C Screening  Never done  . TETANUS/TDAP  Never done  . COLONOSCOPY (Pts 45-20yrs Insurance coverage will need to be confirmed)  Never done    There are no preventive care reminders to display for this patient.  No results found for: TSH Lab Results  Component Value Date   WBC 15.5 (H) 08/22/2019   HGB 15.7 08/22/2019   HCT 47.3 08/22/2019   MCV 79.1 (L) 08/22/2019   PLT 280 08/22/2019   Lab Results  Component Value Date   NA 139 08/22/2019   K 3.1 (L) 08/22/2019   CO2 23 08/22/2019   GLUCOSE 136 (H) 08/22/2019   BUN 11 08/22/2019   CREATININE 0.91 08/22/2019   BILITOT 1.1 08/22/2019   ALKPHOS 58 08/22/2019   AST 29 08/22/2019   ALT 20 08/22/2019   PROT 7.9 08/22/2019   ALBUMIN  4.2 08/22/2019   CALCIUM 9.7 08/22/2019   ANIONGAP 19 (H) 08/22/2019   No results found for: CHOL No results found for: HDL No results found for: LDLCALC No results found for: TRIG No results found for: CHOLHDL No results found for: ALPF7T    Assessment & Plan:   Problem List Items Addressed  This Visit      Cardiovascular and Mediastinum   Essential hypertension - Primary    -  - I encouraged the patient to eat a low-sodium diet to help control blood pressure. - I encouraged the patient to live an active lifestyle and complete activities that increases heart rate to 85% target heart rate at least 5 times per week for one hour.          SVT (supraventricular tachycardia) (HCC)    Electrocardiogram was performed  supraventricular tachycardia was noted, no suggestive changes of myocardial ischemia, will start on Metroprolol        Other   Abdominal pain    Abdomen is soft nontender there is no hepatosplenomegaly Percell Miller sign is negative bowel sounds are audible we will check a CBC and metabolic panel      Relevant Orders   CBC with Differential/Platelet   COMPLETE METABOLIC PANEL WITH GFR   Morbid obesity due to excess calories ([place])    - I encouraged the patient to lose weight.  - I educated them on making healthy dietary choices including eating more fruits and vegetables and less fried foods. - I encouraged the patient to exercise more, and educated on the benefits of exercise .          Report of the EKG.  EKG shows supraventricular tachycardia low QRS voltage nonspecific ST-T abnormalities.  Incomplete right bundle branch block. No orders of the defined types were placed in this encounter.   Follow-up: No follow-ups on file.  Patient was advised to follow-up tomorrow to review the lab test.   [name], MD

## 2020-06-25 NOTE — Assessment & Plan Note (Signed)
Abdomen is soft nontender there is no hepatosplenomegaly Eulah Pont sign is negative bowel sounds are audible we will check a CBC and metabolic panel

## 2020-06-25 NOTE — Assessment & Plan Note (Signed)
Electrocardiogram was performed  supraventricular tachycardia was noted, no suggestive changes of myocardial ischemia, will start on Metroprolol

## 2020-06-25 NOTE — Assessment & Plan Note (Signed)
.  -   I encouraged the patient to eat a low-sodium diet to help control blood pressure. - I encouraged the patient to live an active lifestyle and complete activities that increases heart rate to 85% target heart rate at least 5 times per week for one hour.     

## 2020-06-25 NOTE — Assessment & Plan Note (Signed)
-   I encouraged the patient to lose weight.  - I educated them on making healthy dietary choices including eating more fruits and vegetables and less fried foods. - I encouraged the patient to exercise more, and educated on the benefits of exercise  

## 2020-06-26 ENCOUNTER — Other Ambulatory Visit: Payer: Self-pay

## 2020-06-26 ENCOUNTER — Ambulatory Visit (INDEPENDENT_AMBULATORY_CARE_PROVIDER_SITE_OTHER): Payer: Medicare Other | Admitting: Internal Medicine

## 2020-06-26 ENCOUNTER — Encounter: Payer: Self-pay | Admitting: Internal Medicine

## 2020-06-26 VITALS — BP 142/86 | HR 100 | Ht 73.0 in | Wt 389.8 lb

## 2020-06-26 DIAGNOSIS — R109 Unspecified abdominal pain: Secondary | ICD-10-CM | POA: Diagnosis not present

## 2020-06-26 DIAGNOSIS — I10 Essential (primary) hypertension: Secondary | ICD-10-CM | POA: Diagnosis not present

## 2020-06-26 DIAGNOSIS — I471 Supraventricular tachycardia: Secondary | ICD-10-CM

## 2020-06-27 ENCOUNTER — Other Ambulatory Visit: Payer: Self-pay

## 2020-06-27 ENCOUNTER — Encounter: Payer: Self-pay | Admitting: Internal Medicine

## 2020-06-27 MED ORDER — METOPROLOL SUCCINATE ER 50 MG PO TB24: 50.0000 mg | ORAL_TABLET | Freq: Every day | ORAL | 3 refills | Status: DC

## 2020-06-27 NOTE — Assessment & Plan Note (Signed)
-   I encouraged the patient to lose weight.  - I educated them on making healthy dietary choices including eating more fruits and vegetables and less fried foods. - I encouraged the patient to exercise more, and educated on the benefits of exercise including weight loss, diabetes management, and hypertension management.   

## 2020-06-27 NOTE — Assessment & Plan Note (Signed)
Abdominal pain is most likely gas pain.  But I suggested that he should get an ultrasound of the gallbladder and liver to further determine the etiology and to make sure he does not have cholecystitis.  Ernest [name]

## 2020-06-27 NOTE — Progress Notes (Signed)
Established Patient Office Visit  Subjective:  Patient ID: Ernest [name], male    DOB: 1945-10-18  Age: 75 y.o. MRN: [id number]  CC:  Chief Complaint  Patient presents with  . Lab Results    HPI  BON DOWIS II presents for follow-up on abdominal pain  Past Medical History:  Diagnosis Date  . Chronic venous insufficiency of lower extremity   . Sleep apnea     Past Surgical History:  Procedure Laterality Date  . HERNIA REPAIR     inguinal and umbilical    History reviewed. No pertinent family history.  Social History   Socioeconomic History  . Marital status: Married    Spouse name: Not on file  . Number of children: Not on file  . Years of education: Not on file  . Highest education level: Not on file  Occupational History  . Not on file  Tobacco Use  . Smoking status: Never Smoker  . Smokeless tobacco: Never Used  Substance and Sexual Activity  . Alcohol use: Not Currently    Alcohol/week: 0.0 standard drinks  . Drug use: Not Currently  . Sexual activity: Not on file  Other Topics Concern  . Not on file  Social History Narrative  . Not on file   Social Determinants of Health   Financial Resource Strain: Not on file  Food Insecurity: Not on file  Transportation Needs: Not on file  Physical Activity: Not on file  Stress: Not on file  Social Connections: Not on file  Intimate Partner Violence: Not on file     Current Outpatient Medications:  .  amiloride-hydrochlorothiazide (MODURETIC) 5-50 MG tablet, Take 1 tablet by mouth daily., Disp: , Rfl: 6 .  Azelastine-Fluticasone (DYMISTA) 137-50 MCG/ACT SUSP, Place into the nose., Disp: , Rfl:  .  BEE POLLEN PO, Take by mouth., Disp: , Rfl:  .  co-enzyme Q-10 30 MG capsule, Take 30 mg by mouth 3 (three) times daily., Disp: , Rfl:  .  Ginkgo Biloba Extract 60 MG CAPS, Take by mouth., Disp: , Rfl:  .  Green Coffee Bean-Yerba Mate (GREEN COFFEE BEAN EXTRACT PO), Take by mouth., Disp: , Rfl:  .   metoprolol succinate (TOPROL-XL) 50 MG 24 hr tablet, Take 1 tablet (50 mg total) by mouth daily. Take with or immediately following a meal., Disp: 90 tablet, Rfl: 3 .  Misc Natural Products (OSTEO BI-FLEX JOINT SHIELD PO), Take by mouth., Disp: , Rfl:  .  Multiple Vitamins-Minerals (CENTRUM SILVER ADULT 50+ PO), Take by mouth., Disp: , Rfl:  .  Multiple Vitamins-Minerals (PRESERVISION AREDS 2) CAPS, Take by mouth., Disp: , Rfl:  .  nystatin (MYCOSTATIN/NYSTOP) powder, Apply 1 application topically 3 (three) times daily. Use after every shower to prevent rash, Disp: 30 g, Rfl: 6 .  Omega-3 Fatty Acids (OMEGA 3 PO), Take by mouth., Disp: , Rfl:  .  pantoprazole (PROTONIX) 40 MG tablet, TAKE 1 TABLET BY MOUTH EVERY DAY, Disp: 90 tablet, Rfl: 3 .  polyethylene glycol (GOLYTELY) 236 g solution, Take 4,000 mLs by mouth once. Drink one 8 oz glass every 30 mins until stools are clear., Disp: 4000 mL, Rfl: 0 .  traMADol (ULTRAM) 50 MG tablet, Take 50 mg by mouth 2 (two) times daily., Disp: , Rfl: 2   No Known Allergies  ROS Review of Systems  Constitutional: Negative.   HENT: Negative.   Eyes: Negative.   Respiratory: Negative.   Cardiovascular: Negative.   Gastrointestinal: Positive for  blood in stool. Negative for abdominal distention, abdominal pain, anal bleeding, constipation and diarrhea.  Endocrine: Negative.   Genitourinary: Negative.  Negative for dysuria and frequency.  Musculoskeletal: Positive for gait problem. Negative for arthralgias.  Skin: Negative.   Allergic/Immunologic: Negative.   Hematological: Negative.   Psychiatric/Behavioral: Negative.  Negative for behavioral problems, confusion and dysphoric mood. The patient is not nervous/anxious.   All other systems reviewed and are negative.     Objective:    Physical Exam Vitals reviewed.  Constitutional:      Appearance: Normal appearance.  HENT:     Mouth/Throat:     Mouth: Mucous membranes are moist.  Eyes:      Pupils: Pupils are equal, round, and reactive to light.  Neck:     Vascular: No carotid bruit.  Cardiovascular:     Rate and Rhythm: Normal rate and regular rhythm.     Pulses: Normal pulses.     Heart sounds: Normal heart sounds.  Pulmonary:     Effort: Pulmonary effort is normal.     Breath sounds: Normal breath sounds.  Abdominal:     General: Bowel sounds are normal.     Palpations: Abdomen is soft. There is no hepatomegaly, splenomegaly or mass.     Tenderness: There is no abdominal tenderness.     Hernia: No hernia is present.  Musculoskeletal:     Cervical back: Neck supple.     Right lower leg: No edema.     Left lower leg: No edema.  Skin:    Findings: No rash.  Neurological:     Mental Status: He is alert and oriented to person, place, and time.     Motor: No weakness.  Psychiatric:        Mood and Affect: Mood normal.        Behavior: Behavior normal.     BP (!) 142/86   Pulse 100   Ht 6\' 1"  (1.854 m)   Wt (!) 389 lb 12.8 oz (176.8 kg)   BMI 51.43 kg/m  Wt Readings from Last 3 Encounters:  06/26/20 (!) 389 lb 12.8 oz (176.8 kg)  06/25/20 (!) 391 lb 3.2 oz (177.4 kg)  08/22/19 (!) 400 lb (181.4 kg)     Health Maintenance Due  Topic Date Due  . Hepatitis C Screening  Never done  . TETANUS/TDAP  Never done  . COLONOSCOPY (Pts 45-19yrs Insurance coverage will need to be confirmed)  Never done    There are no preventive care reminders to display for this patient.  No results found for: TSH Lab Results  Component Value Date   WBC 11.4 (H) 06/25/2020   HGB 15.3 06/25/2020   HCT 45.8 06/25/2020   MCV 79.2 (L) 06/25/2020   PLT 254 06/25/2020   Lab Results  Component Value Date   NA 139 06/25/2020   K 4.0 06/25/2020   CO2 28 06/25/2020   GLUCOSE 91 06/25/2020   BUN 12 06/25/2020   CREATININE 0.83 06/25/2020   BILITOT 0.4 06/25/2020   ALKPHOS 58 08/22/2019   AST 15 06/25/2020   ALT 9 06/25/2020   PROT 6.0 (L) 06/25/2020   ALBUMIN 4.2  08/22/2019   CALCIUM 8.6 06/25/2020   ANIONGAP 19 (H) 08/22/2019   No results found for: CHOL No results found for: HDL No results found for: LDLCALC No results found for: TRIG No results found for: CHOLHDL No results found for: HGBA1C    Assessment & Plan:   Problem List Items  Addressed This Visit      Cardiovascular and Mediastinum   Essential hypertension    Blood pressure is stable on the present medication.  Patient had an episode of supraventricular tachycardia so he was started on metoprolol succinate 50 mg p.o. daily.  He denies any chest pain sweating nausea vomiting or shortness of breath.  Abdominal pain is gone.  I however told him that he needs to have an ultrasound.  His CBC was explained to him that he has elevated monocyte and has to be checked in a month again or he can see a hematologist.  And he is agreeable for this plan of treatment      SVT (supraventricular tachycardia) ([place])    SVT has resolved.  His heart rate is 78        Other   Abdominal pain - Primary    Abdominal pain is most likely gas pain.  But I suggested that he should get an ultrasound of the gallbladder and liver to further determine the etiology and to make sure he does not have cholecystitis.  .      Morbid obesity due to excess calories ([place])    - I encouraged the patient to lose weight.  - I educated them on making healthy dietary choices including eating more fruits and vegetables and less fried foods. - I encouraged the patient to exercise more, and educated on the benefits of exercise including weight loss, diabetes management, and hypertension management.          Patient is not depressed, his main problem is high BMI and suggested in the past that he should considered gastric bypass surgery but he does not want to do that.  His sleep apnea is stable.  He takes a lot of over-the-counter supplement No orders of the defined types were placed in this encounter.   Follow-up: No  follow-ups on file.    [name], MD

## 2020-06-27 NOTE — Assessment & Plan Note (Signed)
SVT has resolved.  His heart rate is 78

## 2020-06-27 NOTE — Assessment & Plan Note (Signed)
Blood pressure is stable on the present medication.  Patient had an episode of supraventricular tachycardia so he was started on metoprolol succinate 50 mg p.o. daily.  He denies any chest pain sweating nausea vomiting or shortness of breath.  Abdominal pain is gone.  I however told him that he needs to have an ultrasound.  His CBC was explained to him that he has elevated monocyte and has to be checked in a month again or he can see a hematologist.  And he is agreeable for this plan of treatment

## 2020-07-19 ENCOUNTER — Other Ambulatory Visit: Payer: Self-pay | Admitting: Internal Medicine

## 2020-07-24 ENCOUNTER — Encounter: Payer: Self-pay | Admitting: Ophthalmology

## 2020-07-25 ENCOUNTER — Other Ambulatory Visit: Payer: Self-pay

## 2020-07-25 ENCOUNTER — Ambulatory Visit (INDEPENDENT_AMBULATORY_CARE_PROVIDER_SITE_OTHER): Payer: Medicare Other | Admitting: Internal Medicine

## 2020-07-25 ENCOUNTER — Encounter: Payer: Self-pay | Admitting: Internal Medicine

## 2020-07-25 VITALS — BP 125/75 | HR 82 | Ht 73.0 in | Wt 383.6 lb

## 2020-07-25 DIAGNOSIS — I10 Essential (primary) hypertension: Secondary | ICD-10-CM | POA: Diagnosis not present

## 2020-07-25 DIAGNOSIS — R109 Unspecified abdominal pain: Secondary | ICD-10-CM | POA: Diagnosis not present

## 2020-07-25 DIAGNOSIS — R599 Enlarged lymph nodes, unspecified: Secondary | ICD-10-CM | POA: Diagnosis not present

## 2020-07-25 MED ORDER — LEVOFLOXACIN 500 MG PO TABS: 500.0000 mg | ORAL_TABLET | Freq: Every day | ORAL | 0 refills | Status: AC

## 2020-07-25 NOTE — Progress Notes (Signed)
Established Patient Office Visit  Subjective:  Patient ID: Ernest [name], male    DOB: [birth year]  Age: 75 y.o. MRN: [id number]  CC:  Chief Complaint  Patient presents with  . Adenopathy    Right side behind ear. Patient states that this same lymph node was swollen last year.    HPI  Ernest [name] presents for right submandibular swelling he denies any history of fever chills dental infection.  Patient has a similar problem before.  Patient previous ENT specialist is Dr. Pryor Ochoa Past Medical History:  Diagnosis Date  . Chronic venous insufficiency of lower extremity   . GERD (gastroesophageal reflux disease)   . Sleep apnea     Past Surgical History:  Procedure Laterality Date  . HERNIA REPAIR     inguinal and umbilical    History reviewed. No pertinent family history.  Social History   Socioeconomic History  . Marital status: Married    Spouse name: Not on file  . Number of children: Not on file  . Years of education: Not on file  . Highest education level: Not on file  Occupational History  . Not on file  Tobacco Use  . Smoking status: Never Smoker  . Smokeless tobacco: Never Used  Substance and Sexual Activity  . Alcohol use: Yes    Alcohol/week: 0.0 standard drinks    Comment: rarely  . Drug use: Not Currently  . Sexual activity: Not on file  Other Topics Concern  . Not on file  Social History Narrative  . Not on file   Social Determinants of Health   Financial Resource Strain: Not on file  Food Insecurity: Not on file  Transportation Needs: Not on file  Physical Activity: Not on file  Stress: Not on file  Social Connections: Not on file  Intimate Partner Violence: Not on file     Current Outpatient Medications:  .  levofloxacin (LEVAQUIN) 500 MG tablet, Take 1 tablet (500 mg total) by mouth daily for 7 days., Disp: 7 tablet, Rfl: 0 .  amiloride-hydrochlorothiazide (MODURETIC) 5-50 MG tablet, Take 1 tablet by mouth daily., Disp: ,  Rfl: 6 .  Azelastine-Fluticasone (DYMISTA) 137-50 MCG/ACT SUSP, Place into the nose., Disp: , Rfl:  .  co-enzyme Q-10 30 MG capsule, Take 30 mg by mouth 3 (three) times daily., Disp: , Rfl:  .  Ginkgo Biloba Extract 60 MG CAPS, Take by mouth. (Patient not taking: Reported on 07/24/2020), Disp: , Rfl:  .  metoprolol succinate (TOPROL-XL) 50 MG 24 hr tablet, Take 1 tablet (50 mg total) by mouth daily. Take with or immediately following a meal., Disp: 90 tablet, Rfl: 3 .  Misc Natural Products (OSTEO BI-FLEX JOINT SHIELD PO), Take by mouth., Disp: , Rfl:  .  Multiple Vitamins-Minerals (CENTRUM SILVER ADULT 50+ PO), Take by mouth., Disp: , Rfl:  .  Multiple Vitamins-Minerals (PRESERVISION AREDS 2) CAPS, Take by mouth., Disp: , Rfl:  .  nystatin (MYCOSTATIN/NYSTOP) powder, Apply 1 application topically 3 (three) times daily. Use after every shower to prevent rash, Disp: 30 g, Rfl: 6 .  Omega-3 Fatty Acids (OMEGA 3 PO), Take by mouth., Disp: , Rfl:  .  pantoprazole (PROTONIX) 40 MG tablet, TAKE 1 TABLET BY MOUTH EVERY DAY, Disp: 90 tablet, Rfl: 3 .  Probiotic Product (PROBIOTIC DAILY PO), Take by mouth., Disp: , Rfl:  .  traMADol (ULTRAM) 50 MG tablet, Take 50 mg by mouth 2 (two) times daily., Disp: , Rfl: 2  No Known Allergies  ROS Review of Systems  Constitutional: Negative.   HENT: Positive for facial swelling. Negative for postnasal drip, sinus pressure, sinus pain, sore throat, trouble swallowing and voice change.   Eyes: Negative.   Respiratory: Negative.   Cardiovascular: Negative.   Gastrointestinal: Negative.   Endocrine: Negative.   Genitourinary: Negative.   Musculoskeletal: Negative.   Skin: Negative.   Allergic/Immunologic: Negative.   Neurological: Negative.   Hematological: Positive for adenopathy.  Psychiatric/Behavioral: Negative.   All other systems reviewed and are negative.     Objective:    Physical Exam Vitals reviewed.  Constitutional:      Appearance: Normal  appearance. He is obese.  HENT:     Head: Atraumatic.      Comments: 4 to 5 cm round right mandibular swelling was noted.  There is no tenderness cystic swelling is not fixed.    Mouth/Throat:     Mouth: Mucous membranes are moist.  Eyes:     Pupils: Pupils are equal, round, and reactive to light.  Neck:     Vascular: No carotid bruit.  Cardiovascular:     Rate and Rhythm: Normal rate and regular rhythm.     Pulses: Normal pulses.     Heart sounds: Normal heart sounds.  Pulmonary:     Effort: Pulmonary effort is normal.     Breath sounds: Normal breath sounds.  Abdominal:     General: Bowel sounds are normal.     Palpations: Abdomen is soft. There is no hepatomegaly, splenomegaly or mass.     Tenderness: There is no abdominal tenderness.     Hernia: No hernia is present.  Musculoskeletal:     Cervical back: Neck supple.     Right lower leg: No edema.     Left lower leg: No edema.  Lymphadenopathy:     Head:     Right side of head: Submandibular adenopathy present.  Skin:    Findings: No rash.  Neurological:     Mental Status: He is alert and oriented to person, place, and time.     Motor: No weakness.  Psychiatric:        Mood and Affect: Mood normal.        Behavior: Behavior normal.     BP 125/75   Pulse 82   Ht 6\' 1"  (1.854 m)   Wt (!) 383 lb 9.6 oz (174 kg)   BMI 50.61 kg/m  Wt Readings from Last 3 Encounters:  07/25/20 (!) 383 lb 9.6 oz (174 kg)  06/26/20 (!) 389 lb 12.8 oz (176.8 kg)  06/25/20 (!) 391 lb 3.2 oz (177.4 kg)     Health Maintenance Due  Topic Date Due  . Hepatitis C Screening  Never done  . TETANUS/TDAP  Never done  . COLONOSCOPY (Pts 45-36yrs Insurance coverage will need to be confirmed)  Never done    There are no preventive care reminders to display for this patient.  No results found for: TSH Lab Results  Component Value Date   WBC 11.4 (H) 06/25/2020   HGB 15.3 06/25/2020   HCT 45.8 06/25/2020   MCV 79.2 (L) 06/25/2020    PLT 254 06/25/2020   Lab Results  Component Value Date   NA 139 06/25/2020   K 4.0 06/25/2020   CO2 28 06/25/2020   GLUCOSE 91 06/25/2020   BUN 12 06/25/2020   CREATININE 0.83 06/25/2020   BILITOT 0.4 06/25/2020   ALKPHOS 58 08/22/2019   AST 15 06/25/2020  ALT 9 06/25/2020   PROT 6.0 (L) 06/25/2020   ALBUMIN 4.2 08/22/2019   CALCIUM 8.6 06/25/2020   ANIONGAP 19 (H) 08/22/2019   No results found for: CHOL No results found for: HDL No results found for: LDLCALC No results found for: TRIG No results found for: CHOLHDL No results found for: HGBA1C    Assessment & Plan:   Problem List Items Addressed This Visit      Cardiovascular and Mediastinum   Essential hypertension     .  - I encouraged the patient to eat a low-sodium diet to help control blood pressure. - I encouraged the patient to live an active lifestyle and complete activities that increases heart rate to 85% target heart rate at least 5 times per week for one hour.            Immune and Lymphatic   Swollen lymph nodes - Primary    Patient has a swelling of right submandibular orsalivary gland.  Is nontender he was started on Levaquin and will be referred to ENT for evaluation. CBC and serum amylase was ordered.      Relevant Orders   Ambulatory referral to ENT   CBC with Differential/Platelet   Amylase     Other   Abdominal pain    Patient complaining of abdominal distention and not a gas.  He has been scheduled for ultrasound of the upper abdomen.  We will get a GI consult if needed      Relevant Orders   US Abdomen Complete   Morbid obesity due to excess calories ([place])    - I encouraged the patient to lose weight.  - I educated them on making healthy dietary choices including eating more fruits and vegetables and less fried foods. - I encouraged the patient to exercise more, and educated on the benefits of exercise including weight loss, diabetes prevention, and hypertension management.            Meds ordered this encounter  Medications  . levofloxacin (LEVAQUIN) 500 MG tablet    Sig: Take 1 tablet (500 mg total) by mouth daily for 7 days.    Dispense:  7 tablet    Refill:  0    Follow-up: No follow-ups on file.    [name], MD

## 2020-07-25 NOTE — Assessment & Plan Note (Signed)
.  -   I encouraged the patient to eat a low-sodium diet to help control blood pressure. - I encouraged the patient to live an active lifestyle and complete activities that increases heart rate to 85% target heart rate at least 5 times per week for one hour.     

## 2020-07-25 NOTE — Assessment & Plan Note (Signed)
Patient has a swelling of right submandibular all salivary gland.  Is nontender he was started on Levaquin and will be referred to ENT for evaluation. CBC and serum amylase was ordered.

## 2020-07-25 NOTE — Assessment & Plan Note (Signed)
Patient complaining of abdominal distention and not a gas.  He has been scheduled for ultrasound of the upper abdomen.  We will get a GI consult if needed

## 2020-07-25 NOTE — Assessment & Plan Note (Signed)
-   I encouraged the patient to lose weight.  - I educated them on making healthy dietary choices including eating more fruits and vegetables and less fried foods. - I encouraged the patient to exercise more, and educated on the benefits of exercise including weight loss, diabetes prevention, and hypertension management.   

## 2020-07-26 LAB — CBC WITH DIFFERENTIAL/PLATELET
Absolute Monocytes: 783 cells/uL (ref 200–950)
Basophils Absolute: 9 cells/uL (ref 0–200)
Basophils Relative: 0.1 %
Eosinophils Absolute: 0 cells/uL — ABNORMAL LOW (ref 15–500)
Eosinophils Relative: 0 %
HCT: 43.9 % (ref 38.5–50.0)
Hemoglobin: 14.6 g/dL (ref 13.2–17.1)
Lymphs Abs: 2288 cells/uL (ref 850–3900)
MCH: 26.5 pg — ABNORMAL LOW (ref 27.0–33.0)
MCHC: 33.3 g/dL (ref 32.0–36.0)
MCV: 79.8 fL — ABNORMAL LOW (ref 80.0–100.0)
MPV: 10.9 fL (ref 7.5–12.5)
Monocytes Relative: 9 %
Neutro Abs: 5620 cells/uL (ref 1500–7800)
Neutrophils Relative %: 64.6 %
Platelets: 252 10*3/uL (ref 140–400)
RBC: 5.5 10*6/uL (ref 4.20–5.80)
RDW: 14.1 % (ref 11.0–15.0)
Total Lymphocyte: 26.3 %
WBC: 8.7 10*3/uL (ref 3.8–10.8)

## 2020-07-26 LAB — AMYLASE: Amylase: 54 U/L (ref 21–101)

## 2020-07-27 DIAGNOSIS — K1121 Acute sialoadenitis: Secondary | ICD-10-CM | POA: Diagnosis not present

## 2020-07-27 DIAGNOSIS — R221 Localized swelling, mass and lump, neck: Secondary | ICD-10-CM | POA: Diagnosis not present

## 2020-08-01 DIAGNOSIS — H2512 Age-related nuclear cataract, left eye: Secondary | ICD-10-CM | POA: Diagnosis not present

## 2020-08-01 DIAGNOSIS — I10 Essential (primary) hypertension: Secondary | ICD-10-CM | POA: Diagnosis not present

## 2020-08-02 ENCOUNTER — Other Ambulatory Visit
Admission: RE | Admit: 2020-08-02 | Discharge: 2020-08-02 | Disposition: A | Discharge status: Home / Self Care | Payer: Medicare Other | Source: Ambulatory Visit | Attending: Ophthalmology | Admitting: Ophthalmology

## 2020-08-02 ENCOUNTER — Other Ambulatory Visit: Payer: Self-pay

## 2020-08-02 DIAGNOSIS — Z20822 Contact with and (suspected) exposure to covid-19: Secondary | ICD-10-CM | POA: Insufficient documentation

## 2020-08-02 DIAGNOSIS — Z01812 Encounter for preprocedural laboratory examination: Secondary | ICD-10-CM | POA: Diagnosis not present

## 2020-08-02 LAB — SARS CORONAVIRUS 2 (TAT 6-24 HRS): SARS Coronavirus 2: NEGATIVE

## 2020-08-03 NOTE — Discharge Instructions (Signed)

## 2020-08-06 ENCOUNTER — Ambulatory Visit: Payer: Medicare Other | Admitting: Anesthesiology

## 2020-08-06 ENCOUNTER — Encounter: Payer: Self-pay | Admitting: Ophthalmology

## 2020-08-06 ENCOUNTER — Other Ambulatory Visit: Payer: Self-pay

## 2020-08-06 ENCOUNTER — Encounter
Admission: RE | Disposition: A | Discharge status: Home / Self Care | Payer: Self-pay | Source: Home / Self Care | Attending: Ophthalmology

## 2020-08-06 ENCOUNTER — Ambulatory Visit
Admission: RE | Admit: 2020-08-06 | Discharge: 2020-08-06 | Disposition: A | Discharge status: Home / Self Care | Payer: Medicare Other | Attending: Ophthalmology | Admitting: Ophthalmology

## 2020-08-06 DIAGNOSIS — Z79899 Other long term (current) drug therapy: Secondary | ICD-10-CM | POA: Diagnosis not present

## 2020-08-06 DIAGNOSIS — H2512 Age-related nuclear cataract, left eye: Secondary | ICD-10-CM | POA: Diagnosis not present

## 2020-08-06 DIAGNOSIS — H25812 Combined forms of age-related cataract, left eye: Secondary | ICD-10-CM | POA: Diagnosis not present

## 2020-08-06 HISTORY — PX: CATARACT EXTRACTION W/PHACO: SHX586

## 2020-08-06 HISTORY — DX: Gastro-esophageal reflux disease without esophagitis: K21.9

## 2020-08-06 SURGERY — PHACOEMULSIFICATION, CATARACT, WITH IOL INSERTION
Anesthesia: Monitor Anesthesia Care | Site: Eye | Laterality: Left

## 2020-08-06 MED ORDER — SODIUM HYALURONATE 23 MG/ML IO SOLN
INTRAOCULAR | Status: DC | PRN
  Administered 2020-08-06: 0.6 mL via INTRAOCULAR

## 2020-08-06 MED ORDER — FENTANYL CITRATE (PF) 100 MCG/2ML IJ SOLN
INTRAMUSCULAR | Status: DC | PRN
  Administered 2020-08-06: 50 ug via INTRAVENOUS

## 2020-08-06 MED ORDER — MIDAZOLAM HCL 2 MG/2ML IJ SOLN
INTRAMUSCULAR | Status: DC | PRN
  Administered 2020-08-06: 1 mg via INTRAVENOUS

## 2020-08-06 MED ORDER — SODIUM HYALURONATE 10 MG/ML IO SOLN
INTRAOCULAR | Status: DC | PRN
  Administered 2020-08-06: 0.55 mL via INTRAOCULAR

## 2020-08-06 MED ORDER — EPINEPHRINE PF 1 MG/ML IJ SOLN
INTRAOCULAR | Status: DC | PRN
  Administered 2020-08-06: 68 mL via OPHTHALMIC

## 2020-08-06 MED ORDER — ARMC OPHTHALMIC DILATING DROPS
1.0000 "application " | OPHTHALMIC | Status: DC | PRN
  Administered 2020-08-06 (×3): 1 via OPHTHALMIC

## 2020-08-06 MED ORDER — TETRACAINE HCL 0.5 % OP SOLN
1.0000 [drp] | OPHTHALMIC | Status: DC | PRN
  Administered 2020-08-06 (×3): 1 [drp] via OPHTHALMIC

## 2020-08-06 MED ORDER — MOXIFLOXACIN HCL 0.5 % OP SOLN
OPHTHALMIC | Status: DC | PRN
  Administered 2020-08-06: 0.2 mL via OPHTHALMIC

## 2020-08-06 MED ORDER — LACTATED RINGERS IV SOLN: INTRAVENOUS | Status: DC

## 2020-08-06 MED ORDER — LIDOCAINE HCL (PF) 2 % IJ SOLN
INTRAOCULAR | Status: DC | PRN
  Administered 2020-08-06: 1 mL via INTRAOCULAR

## 2020-08-06 SURGICAL SUPPLY — 20 items
CANNULA ANT/CHMB 27G (MISCELLANEOUS) ×2 IMPLANT
CANNULA ANT/CHMB 27GA (MISCELLANEOUS) ×4 IMPLANT
DISSECTOR HYDRO NUCLEUS 50X22 (MISCELLANEOUS) ×2 IMPLANT
GLOVE SURG LX 7.5 STRW (GLOVE) ×2
GLOVE SURG LX STRL 7.5 STRW (GLOVE) ×1 IMPLANT
GLOVE SURG SYN 8.5  E (GLOVE) ×2
GLOVE SURG SYN 8.5 E (GLOVE) ×1 IMPLANT
GLOVE SURG SYN 8.5 PF PI (GLOVE) ×1 IMPLANT
GOWN STRL REUS W/ TWL LRG LVL3 (GOWN DISPOSABLE) ×2 IMPLANT
GOWN STRL REUS W/TWL LRG LVL3 (GOWN DISPOSABLE) ×4
LENS IOL TECNIS EYHANCE 21.0 (Intraocular Lens) ×1 IMPLANT
MARKER SKIN DUAL TIP RULER LAB (MISCELLANEOUS) ×2 IMPLANT
PACK DR. KING ARMS (PACKS) ×2 IMPLANT
PACK EYE AFTER SURG (MISCELLANEOUS) ×2 IMPLANT
PACK OPTHALMIC (MISCELLANEOUS) ×2 IMPLANT
STRIP CLOSURE SKIN 1/2X4 (GAUZE/BANDAGES/DRESSINGS) ×1 IMPLANT
SYR 3ML LL SCALE MARK (SYRINGE) ×2 IMPLANT
SYR TB 1ML LUER SLIP (SYRINGE) ×2 IMPLANT
WATER STERILE IRR 250ML POUR (IV SOLUTION) ×2 IMPLANT
WIPE NON LINTING 3.25X3.25 (MISCELLANEOUS) ×2 IMPLANT

## 2020-08-06 NOTE — Op Note (Signed)
OPERATIVE NOTE  Ernest [name] [id number] 08/06/2020   PREOPERATIVE DIAGNOSIS:  Nuclear sclerotic cataract left eye.  H25.12   POSTOPERATIVE DIAGNOSIS:    Nuclear sclerotic cataract left eye.     PROCEDURE:  Phacoemusification with posterior chamber intraocular lens placement of the left eye   LENS:   Implant Name Type Inv. Item Serial No. Manufacturer Lot No. LRB No. Used Action  LENS IOL TECNIS EYHANCE 21.0 - [id number] Intraocular Lens LENS IOL TECNIS EYHANCE 21.0 [id number] JOHNSON   Left 1 Implanted      Procedure(s) with comments: CATARACT EXTRACTION PHACO AND INTRAOCULAR LENS PLACEMENT (IOC) LEFT (Left) - 3.41 0:31.5  DIB00 +21.0   ULTRASOUND TIME: 0 minutes 31 seconds.  CDE 3.41   SURGEON:  Benay Pillow, MD, MPH   ANESTHESIA:  Topical with tetracaine drops augmented with 1% preservative-free intracameral lidocaine.  ESTIMATED BLOOD LOSS: <1 mL   COMPLICATIONS:  None.   DESCRIPTION OF PROCEDURE:  The patient was identified in the holding room and transported to the operating room and placed in a mopdified (reclined) position under the operating microscope.  The left eye was identified as the operative eye and it was prepped and draped in the usual sterile ophthalmic fashion.   A 1.0 millimeter clear-corneal paracentesis was made at the 5:00 position. 0.5 ml of preservative-free 1% lidocaine with epinephrine was injected into the anterior chamber.  The anterior chamber was filled with Healon 5 viscoelastic.  A 2.4 millimeter keratome was used to make a near-clear corneal incision at the 2:00 position.  A curvilinear capsulorrhexis was made with a cystotome and capsulorrhexis forceps.  Balanced salt solution was used to hydrodissect and hydrodelineate the nucleus.   Phacoemulsification was then used in stop and chop fashion to remove the lens nucleus and epinucleus.  The remaining cortex was then removed using the irrigation and aspiration handpiece. Healon was then  placed into the capsular bag to distend it for lens placement.  A lens was then injected into the capsular bag.  The remaining viscoelastic was aspirated.   Wounds were hydrated with balanced salt solution.  The anterior chamber was inflated to a physiologic pressure with balanced salt solution.  Intracameral vigamox 0.1 mL undiltued was injected into the eye and a drop placed onto the ocular surface.  No wound leaks were noted.  The patient was taken to the recovery room in stable condition without complications of anesthesia or surgery.  Of note, the patient was unable to lie flat and the surgery was performed in a reclined position, which was a challenge, but there were no complications.   Benay Pillow 08/06/2020, 12:07 PM

## 2020-08-06 NOTE — Anesthesia Postprocedure Evaluation (Signed)
Anesthesia Post Note  Patient: Ernest [name]  Procedure(s) Performed: CATARACT EXTRACTION PHACO AND INTRAOCULAR LENS PLACEMENT (IOC) LEFT (Left Eye)     Patient location during evaluation: PACU Anesthesia Type: MAC Level of consciousness: awake and alert Pain management: pain level controlled Vital Signs Assessment: post-procedure vital signs reviewed and stable Respiratory status: spontaneous breathing and nonlabored ventilation Cardiovascular status: blood pressure returned to baseline Postop Assessment: no apparent nausea or vomiting Anesthetic complications: no   No complications documented.  Shaunika Italiano [employer]

## 2020-08-06 NOTE — Transfer of Care (Signed)
Immediate Anesthesia Transfer of Care Note  Patient: Ernest [name]  Procedure(s) Performed: CATARACT EXTRACTION PHACO AND INTRAOCULAR LENS PLACEMENT (IOC) LEFT (Left Eye)  Patient Location: PACU  Anesthesia Type: MAC  Level of Consciousness: awake, alert  and patient cooperative  Airway and Oxygen Therapy: Patient Spontanous Breathing and Patient connected to supplemental oxygen  Post-op Assessment: Post-op Vital signs reviewed, Patient's Cardiovascular Status Stable, Respiratory Function Stable, Patent Airway and No signs of Nausea or vomiting  Post-op Vital Signs: Reviewed and stable  Complications: No complications documented.

## 2020-08-06 NOTE — H&P (Signed)
[facility]   Primary Care Physician:  [name], MD Ophthalmologist: Dr. Benay Pillow  Pre-Procedure History & Physical: HPI:  Ernest [name] is a 75 y.o. male here for cataract surgery.   Past Medical History:  Diagnosis Date  . Chronic venous insufficiency of lower extremity   . GERD (gastroesophageal reflux disease)   . Sleep apnea     Past Surgical History:  Procedure Laterality Date  . HERNIA REPAIR     inguinal and umbilical    Prior to Admission medications   Medication Sig Start Date End Date Taking? Authorizing Provider  amiloride-hydrochlorothiazide (MODURETIC) 5-50 MG tablet Take 1 tablet by mouth daily. 11/22/14  Yes [provider]  Azelastine-Fluticasone 137-50 MCG/ACT SUSP Place into the nose.   Yes [provider]  co-enzyme Q-10 30 MG capsule Take 30 mg by mouth 3 (three) times daily.   Yes [provider]  levoFLOXacin (LEVAQUIN PO) Take by mouth.   Yes [provider]  metoprolol succinate (TOPROL-XL) 50 MG 24 hr tablet Take 1 tablet (50 mg total) by mouth daily. Take with or immediately following a meal. 06/27/20  Yes Masoud, Viann Shove, MD  Misc Natural Products (OSTEO BI-FLEX JOINT SHIELD PO) Take by mouth.   Yes [provider]  Multiple Vitamins-Minerals (CENTRUM SILVER ADULT 50+ PO) Take by mouth.   Yes [provider]  Multiple Vitamins-Minerals (PRESERVISION AREDS 2) CAPS Take by mouth.   Yes [provider]  nystatin (MYCOSTATIN/NYSTOP) powder Apply 1 application topically 3 (three) times daily. Use after every shower to prevent rash 06/04/20  Yes [name], MD  Omega-3 Fatty Acids (OMEGA 3 PO) Take by mouth.   Yes [provider]  pantoprazole (PROTONIX) 40 MG tablet TAKE 1 TABLET BY MOUTH EVERY DAY 05/24/20  Yes [name], MD  Probiotic Product (PROBIOTIC DAILY PO) Take by mouth.   Yes [provider]  Ginkgo Biloba Extract 60 MG CAPS Take by mouth. Patient  not taking: Reported on 07/24/2020    [provider]  traMADol (ULTRAM) 50 MG tablet Take 50 mg by mouth 2 (two) times daily. 12/14/14   [provider]    Allergies as of 06/08/2020  . (No Known Allergies)    History reviewed. No pertinent family history.  Social History   Socioeconomic History  . Marital status: Married    Spouse name: Not on file  . Number of children: Not on file  . Years of education: Not on file  . Highest education level: Not on file  Occupational History  . Not on file  Tobacco Use  . Smoking status: Never Smoker  . Smokeless tobacco: Never Used  Substance and Sexual Activity  . Alcohol use: Yes    Alcohol/week: 0.0 standard drinks    Comment: rarely  . Drug use: Not Currently  . Sexual activity: Not on file  Other Topics Concern  . Not on file  Social History Narrative  . Not on file   Social Determinants of Health   Financial Resource Strain: Not on file  Food Insecurity: Not on file  Transportation Needs: Not on file  Physical Activity: Not on file  Stress: Not on file  Social Connections: Not on file  Intimate Partner Violence: Not on file    Review of Systems: See HPI, otherwise negative ROS  Physical Exam: BP (!) 142/77   Pulse (!) 118   Temp 98.2 F (36.8 C) (Temporal)   Resp 16   Ht  6\' 1"  (1.854 m)   Wt (!) 174.2 kg   SpO2 96%   BMI 50.66 kg/m  General:   Alert,  pleasant and cooperative in NAD Head:  Normocephalic and atraumatic. Respiratory:  Normal work of breathing.  Impression/Plan: Ernest [name] is here for cataract surgery.  Risks, benefits, limitations, and alternatives regarding cataract surgery have been reviewed with the patient.  Questions have been answered.  All parties agreeable.   Benay Pillow, MD  08/06/2020, 11:14 AM

## 2020-08-06 NOTE — Anesthesia Preprocedure Evaluation (Addendum)
Anesthesia Evaluation  Patient identified by MRN, date of birth, ID band Patient awake    Reviewed: Allergy & Precautions, NPO status , Patient's Chart, lab work & pertinent test results  Airway Mallampati: III  TM Distance: >3 FB Neck ROM: Full    Dental no notable dental hx.    Pulmonary sleep apnea (Patient states he is suppose to use BiPAP, but does not tolerate it) and Continuous Positive Airway Pressure Ventilation ,    Pulmonary exam normal        Cardiovascular hypertension, Normal cardiovascular exam     Neuro/Psych    GI/Hepatic GERD  Controlled,  Endo/Other  Morbid obesity (BMI 50)  Renal/GU      Musculoskeletal  (+) Arthritis ,   Abdominal (+) + obese,   Peds  Hematology   Anesthesia Other Findings   Reproductive/Obstetrics                            Anesthesia Physical Anesthesia Plan  ASA: III  Anesthesia Plan: MAC   Post-op Pain Management:    Induction: Intravenous  PONV Risk Score and Plan: 1 and Treatment may vary due to age or medical condition, TIVA and Midazolam  Airway Management Planned: Natural Airway and Nasal Cannula  Additional Equipment: None  Intra-op Plan:   Post-operative Plan:   Informed Consent: I have reviewed the patients History and Physical, chart, labs and discussed the procedure including the risks, benefits and alternatives for the proposed anesthesia with the patient or authorized representative who has indicated his/her understanding and acceptance.     Dental advisory given  Plan Discussed with: CRNA  Anesthesia Plan Comments:         Anesthesia Quick Evaluation

## 2020-08-06 NOTE — Anesthesia Procedure Notes (Signed)
Procedure Name: MAC Date/Time: 08/06/2020 11:45 AM Performed by: Jeannene Patella, CRNA Pre-anesthesia Checklist: Patient identified, Emergency Drugs available, Suction available, Timeout performed and Patient being monitored Patient Re-evaluated:Patient Re-evaluated prior to induction Oxygen Delivery Method: Nasal cannula Placement Confirmation: positive ETCO2

## 2020-08-07 ENCOUNTER — Other Ambulatory Visit: Payer: Self-pay | Admitting: Otolaryngology

## 2020-08-07 ENCOUNTER — Encounter: Payer: Self-pay | Admitting: Ophthalmology

## 2020-08-07 ENCOUNTER — Other Ambulatory Visit (HOSPITAL_COMMUNITY): Payer: Self-pay | Admitting: Otolaryngology

## 2020-08-07 DIAGNOSIS — R221 Localized swelling, mass and lump, neck: Secondary | ICD-10-CM | POA: Diagnosis not present

## 2020-08-07 DIAGNOSIS — H903 Sensorineural hearing loss, bilateral: Secondary | ICD-10-CM | POA: Diagnosis not present

## 2020-08-07 DIAGNOSIS — K118 Other diseases of salivary glands: Secondary | ICD-10-CM

## 2020-08-10 ENCOUNTER — Other Ambulatory Visit: Payer: Self-pay

## 2020-08-10 ENCOUNTER — Ambulatory Visit
Admission: RE | Admit: 2020-08-10 | Discharge: 2020-08-10 | Disposition: A | Discharge status: Home / Self Care | Payer: Medicare Other | Source: Ambulatory Visit | Attending: Internal Medicine | Admitting: Internal Medicine

## 2020-08-10 DIAGNOSIS — R109 Unspecified abdominal pain: Secondary | ICD-10-CM | POA: Diagnosis not present

## 2020-08-10 DIAGNOSIS — K8020 Calculus of gallbladder without cholecystitis without obstruction: Secondary | ICD-10-CM | POA: Diagnosis not present

## 2020-08-16 DIAGNOSIS — H2511 Age-related nuclear cataract, right eye: Secondary | ICD-10-CM | POA: Diagnosis not present

## 2020-08-17 ENCOUNTER — Other Ambulatory Visit: Payer: Self-pay

## 2020-08-17 ENCOUNTER — Ambulatory Visit
Admission: RE | Admit: 2020-08-17 | Discharge: 2020-08-17 | Disposition: A | Discharge status: Home / Self Care | Payer: Medicare Other | Source: Ambulatory Visit | Attending: Otolaryngology | Admitting: Otolaryngology

## 2020-08-17 DIAGNOSIS — K118 Other diseases of salivary glands: Secondary | ICD-10-CM | POA: Insufficient documentation

## 2020-08-17 DIAGNOSIS — R590 Localized enlarged lymph nodes: Secondary | ICD-10-CM | POA: Diagnosis not present

## 2020-08-17 DIAGNOSIS — I700 Atherosclerosis of aorta: Secondary | ICD-10-CM | POA: Diagnosis not present

## 2020-08-17 LAB — POCT I-STAT CREATININE: Creatinine, Ser: 0.7 mg/dL (ref 0.61–1.24)

## 2020-08-17 MED ORDER — IOHEXOL 300 MG/ML  SOLN
75.0000 mL | Freq: Once | INTRAMUSCULAR | Status: AC | PRN
  Administered 2020-08-17: 75 mL via INTRAVENOUS

## 2020-08-20 ENCOUNTER — Encounter: Payer: Self-pay | Admitting: Ophthalmology

## 2020-08-23 ENCOUNTER — Other Ambulatory Visit
Admission: RE | Admit: 2020-08-23 | Discharge: 2020-08-23 | Disposition: A | Discharge status: Home / Self Care | Payer: Medicare Other | Source: Ambulatory Visit | Attending: Ophthalmology | Admitting: Ophthalmology

## 2020-08-23 ENCOUNTER — Other Ambulatory Visit: Payer: Self-pay

## 2020-08-23 DIAGNOSIS — Z20822 Contact with and (suspected) exposure to covid-19: Secondary | ICD-10-CM | POA: Diagnosis not present

## 2020-08-23 DIAGNOSIS — Z01812 Encounter for preprocedural laboratory examination: Secondary | ICD-10-CM | POA: Diagnosis not present

## 2020-08-23 LAB — SARS CORONAVIRUS 2 (TAT 6-24 HRS): SARS Coronavirus 2: NEGATIVE

## 2020-08-23 NOTE — Discharge Instructions (Signed)

## 2020-08-27 ENCOUNTER — Ambulatory Visit: Payer: Medicare Other | Admitting: Anesthesiology

## 2020-08-27 ENCOUNTER — Encounter: Payer: Self-pay | Admitting: Ophthalmology

## 2020-08-27 ENCOUNTER — Ambulatory Visit
Admission: RE | Admit: 2020-08-27 | Discharge: 2020-08-27 | Disposition: A | Discharge status: Home / Self Care | Payer: Medicare Other | Attending: Ophthalmology | Admitting: Ophthalmology

## 2020-08-27 ENCOUNTER — Encounter
Admission: RE | Disposition: A | Discharge status: Home / Self Care | Payer: Self-pay | Source: Home / Self Care | Attending: Ophthalmology

## 2020-08-27 DIAGNOSIS — H25811 Combined forms of age-related cataract, right eye: Secondary | ICD-10-CM | POA: Diagnosis not present

## 2020-08-27 DIAGNOSIS — H2511 Age-related nuclear cataract, right eye: Secondary | ICD-10-CM | POA: Insufficient documentation

## 2020-08-27 DIAGNOSIS — Z792 Long term (current) use of antibiotics: Secondary | ICD-10-CM | POA: Diagnosis not present

## 2020-08-27 DIAGNOSIS — Z79891 Long term (current) use of opiate analgesic: Secondary | ICD-10-CM | POA: Diagnosis not present

## 2020-08-27 DIAGNOSIS — Z79899 Other long term (current) drug therapy: Secondary | ICD-10-CM | POA: Diagnosis not present

## 2020-08-27 HISTORY — PX: CATARACT EXTRACTION W/PHACO: SHX586

## 2020-08-27 SURGERY — PHACOEMULSIFICATION, CATARACT, WITH IOL INSERTION
Anesthesia: Monitor Anesthesia Care | Site: Eye | Laterality: Right

## 2020-08-27 MED ORDER — MIDAZOLAM HCL 2 MG/2ML IJ SOLN
INTRAMUSCULAR | Status: DC | PRN
  Administered 2020-08-27 (×2): 1 mg via INTRAVENOUS

## 2020-08-27 MED ORDER — MOXIFLOXACIN HCL 0.5 % OP SOLN
OPHTHALMIC | Status: DC | PRN
  Administered 2020-08-27: 0.2 mL via OPHTHALMIC

## 2020-08-27 MED ORDER — SODIUM HYALURONATE 10 MG/ML IO SOLN
INTRAOCULAR | Status: DC | PRN
  Administered 2020-08-27: 0.55 mL via INTRAOCULAR

## 2020-08-27 MED ORDER — SODIUM HYALURONATE 23 MG/ML IO SOLN
INTRAOCULAR | Status: DC | PRN
  Administered 2020-08-27: 0.6 mL via INTRAOCULAR

## 2020-08-27 MED ORDER — FENTANYL CITRATE (PF) 100 MCG/2ML IJ SOLN
INTRAMUSCULAR | Status: DC | PRN
  Administered 2020-08-27: 50 ug via INTRAVENOUS

## 2020-08-27 MED ORDER — EPINEPHRINE PF 1 MG/ML IJ SOLN
INTRAOCULAR | Status: DC | PRN
  Administered 2020-08-27: 119 mL via OPHTHALMIC

## 2020-08-27 MED ORDER — ACETAMINOPHEN 160 MG/5ML PO SOLN: 325.0000 mg | Freq: Once | ORAL | Status: DC

## 2020-08-27 MED ORDER — LIDOCAINE HCL (PF) 2 % IJ SOLN
INTRAOCULAR | Status: DC | PRN
  Administered 2020-08-27: 4 mL via INTRAOCULAR

## 2020-08-27 MED ORDER — LACTATED RINGERS IV SOLN: INTRAVENOUS | Status: DC

## 2020-08-27 MED ORDER — ACETAMINOPHEN 325 MG PO TABS: 325.0000 mg | ORAL_TABLET | Freq: Once | ORAL | Status: DC

## 2020-08-27 MED ORDER — ARMC OPHTHALMIC DILATING DROPS
1.0000 "application " | OPHTHALMIC | Status: DC | PRN
  Administered 2020-08-27 (×3): 1 via OPHTHALMIC

## 2020-08-27 MED ORDER — TETRACAINE HCL 0.5 % OP SOLN
1.0000 [drp] | OPHTHALMIC | Status: DC | PRN
  Administered 2020-08-27 (×3): 1 [drp] via OPHTHALMIC

## 2020-08-27 SURGICAL SUPPLY — 19 items
CANNULA ANT/CHMB 27G (MISCELLANEOUS) ×2 IMPLANT
CANNULA ANT/CHMB 27GA (MISCELLANEOUS) ×4 IMPLANT
DISSECTOR HYDRO NUCLEUS 50X22 (MISCELLANEOUS) ×2 IMPLANT
GLOVE SURG LX 7.5 STRW (GLOVE) ×1
GLOVE SURG LX STRL 7.5 STRW (GLOVE) ×1 IMPLANT
GLOVE SURG SYN 8.5  E (GLOVE) ×2
GLOVE SURG SYN 8.5 E (GLOVE) ×1 IMPLANT
GLOVE SURG SYN 8.5 PF PI (GLOVE) ×1 IMPLANT
GOWN STRL REUS W/ TWL LRG LVL3 (GOWN DISPOSABLE) ×2 IMPLANT
GOWN STRL REUS W/TWL LRG LVL3 (GOWN DISPOSABLE) ×4
LENS IOL TECNIS EYHANCE 19.5 (Intraocular Lens) ×1 IMPLANT
MARKER SKIN DUAL TIP RULER LAB (MISCELLANEOUS) ×2 IMPLANT
PACK DR. KING ARMS (PACKS) ×2 IMPLANT
PACK EYE AFTER SURG (MISCELLANEOUS) ×2 IMPLANT
PACK OPTHALMIC (MISCELLANEOUS) ×2 IMPLANT
SYR 3ML LL SCALE MARK (SYRINGE) ×2 IMPLANT
SYR TB 1ML LUER SLIP (SYRINGE) ×2 IMPLANT
WATER STERILE IRR 250ML POUR (IV SOLUTION) ×2 IMPLANT
WIPE NON LINTING 3.25X3.25 (MISCELLANEOUS) ×2 IMPLANT

## 2020-08-27 NOTE — Transfer of Care (Signed)
Immediate Anesthesia Transfer of Care Note  Patient: Ernest [name]  Procedure(s) Performed: CATARACT EXTRACTION PHACO AND INTRAOCULAR LENS PLACEMENT (IOC) RIGHT 4.10 00:41.6 (Right Eye)  Patient Location: PACU  Anesthesia Type: MAC  Level of Consciousness: awake, alert  and patient cooperative  Airway and Oxygen Therapy: Patient Spontanous Breathing and Patient connected to supplemental oxygen  Post-op Assessment: Post-op Vital signs reviewed, Patient's Cardiovascular Status Stable, Respiratory Function Stable, Patent Airway and No signs of Nausea or vomiting  Post-op Vital Signs: Reviewed and stable  Complications: No complications documented.

## 2020-08-27 NOTE — H&P (Signed)
[place]   Primary Care Physician:  [name], MD Ophthalmologist: Dr. Benay Pillow  Pre-Procedure History & Physical: HPI:  Ernest [name] is a 75 y.o. male here for cataract surgery.   Past Medical History:  Diagnosis Date  . Chronic venous insufficiency of lower extremity   . GERD (gastroesophageal reflux disease)   . Sleep apnea     Past Surgical History:  Procedure Laterality Date  . CATARACT EXTRACTION W/PHACO Left 08/06/2020   Procedure: CATARACT EXTRACTION PHACO AND INTRAOCULAR LENS PLACEMENT ([place]) LEFT;  Surgeon: Eulogio Bear, MD;  Location: [facility];  Service: Ophthalmology;  Laterality: Left;  3.41 0:31.5  . HERNIA REPAIR     inguinal and umbilical    Prior to Admission medications   Medication Sig Start Date End Date Taking? Authorizing Provider  amiloride-hydrochlorothiazide (MODURETIC) 5-50 MG tablet Take 1 tablet by mouth daily. 11/22/14  Yes [provider]  Azelastine-Fluticasone 137-50 MCG/ACT SUSP Place into the nose.   Yes [provider]  co-enzyme Q-10 30 MG capsule Take 30 mg by mouth 3 (three) times daily.   Yes [provider]  levoFLOXacin (LEVAQUIN PO) Take by mouth.   Yes [provider]  metoprolol succinate (TOPROL-XL) 50 MG 24 hr tablet Take 1 tablet (50 mg total) by mouth daily. Take with or immediately following a meal. 06/27/20  Yes Masoud, Viann Shove, MD  Misc Natural Products (OSTEO BI-FLEX JOINT SHIELD PO) Take by mouth.   Yes [provider]  Multiple Vitamins-Minerals (CENTRUM SILVER ADULT 50+ PO) Take by mouth.   Yes [provider]  Multiple Vitamins-Minerals (PRESERVISION AREDS 2) CAPS Take by mouth.   Yes [provider]  nystatin (MYCOSTATIN/NYSTOP) powder Apply 1 application topically 3 (three) times daily. Use after every shower to prevent rash 06/04/20  Yes [name], MD  Omega-3 Fatty Acids (OMEGA 3 PO) Take by mouth.   Yes [provider]  Probiotic Product (PROBIOTIC DAILY PO) Take by mouth.   Yes [provider]  traMADol (ULTRAM) 50 MG tablet Take 50 mg by mouth 2 (two) times daily. 12/14/14  Yes [provider]  Ginkgo Biloba Extract 60 MG CAPS Take by mouth. Patient not taking: Reported on 07/24/2020    [provider]  pantoprazole (PROTONIX) 40 MG tablet TAKE 1 TABLET BY MOUTH EVERY DAY 05/24/20   [name], MD    Allergies as of 06/08/2020  . (No Known Allergies)    History reviewed. No pertinent family history.  Social History   Socioeconomic History  . Marital status: Married    Spouse name: Not on file  . Number of children: Not on file  . Years of education: Not on file  . Highest education level: Not on file  Occupational History  . Not on file  Tobacco Use  . Smoking status: Never Smoker  . Smokeless tobacco: Never Used  Substance and Sexual Activity  . Alcohol use: Yes    Alcohol/week: 0.0 standard drinks    Comment: rarely  . Drug use: Not Currently  . Sexual activity: Not on file  Other Topics Concern  . Not on file  Social History Narrative  . Not on file   Social Determinants of Health   Financial Resource Strain: Not on file  Food Insecurity: Not on file  Transportation Needs: Not on file  Physical Activity: Not on file  Stress: Not on file  Social Connections: Not on file  Intimate Partner Violence: Not  on file    Review of Systems: See HPI, otherwise negative ROS  Physical Exam: BP 125/71   Pulse 91   Temp 97.7 F (36.5 C) (Temporal)   Resp 16   Ht 6\' 1"  (1.854 m)   Wt (!) 179.8 kg   SpO2 95%   BMI 52.30 kg/m  General:   Alert,  pleasant and cooperative in NAD Head:  Normocephalic and atraumatic. Respiratory:  Normal work of breathing.  Impression/Plan: Ernest [name] is here for cataract surgery.  Risks, benefits, limitations, and alternatives regarding cataract surgery have been reviewed with the patient.   Questions have been answered.  All parties agreeable.   Benay Pillow, MD  08/27/2020, 12:29 PM

## 2020-08-27 NOTE — Anesthesia Postprocedure Evaluation (Signed)
Anesthesia Post Note  Patient: Ernest [name]  Procedure(s) Performed: CATARACT EXTRACTION PHACO AND INTRAOCULAR LENS PLACEMENT (IOC) RIGHT 4.10 00:41.6 (Right Eye)     Patient location during evaluation: PACU Anesthesia Type: MAC Level of consciousness: awake and alert and oriented Pain management: satisfactory to patient Vital Signs Assessment: post-procedure vital signs reviewed and stable Respiratory status: spontaneous breathing, nonlabored ventilation and respiratory function stable Cardiovascular status: blood pressure returned to baseline and stable Postop Assessment: Adequate PO intake and No signs of nausea or vomiting Anesthetic complications: no   No complications documented.  Raliegh Ip

## 2020-08-27 NOTE — Anesthesia Procedure Notes (Signed)
Procedure Name: MAC Date/Time: 08/27/2020 12:38 PM Performed by: Jeannene Patella, CRNA Pre-anesthesia Checklist: Patient identified, Emergency Drugs available, Suction available, Timeout performed and Patient being monitored Patient Re-evaluated:Patient Re-evaluated prior to induction Oxygen Delivery Method: Nasal cannula Placement Confirmation: positive ETCO2

## 2020-08-27 NOTE — Op Note (Signed)
OPERATIVE NOTE  ORIAN FIGUEIRA [id number] 08/27/2020   PREOPERATIVE DIAGNOSIS:  Nuclear sclerotic cataract right eye.  H25.11   POSTOPERATIVE DIAGNOSIS:    Nuclear sclerotic cataract right eye.     PROCEDURE:  Phacoemusification with posterior chamber intraocular lens placement of the right eye   LENS:   Implant Name Type Inv. Item Serial No. Manufacturer Lot No. LRB No. Used Action  LENS IOL TECNIS EYHANCE 19.5 - [id number] Intraocular Lens LENS IOL TECNIS EYHANCE 19.5 [id number] JOHNSON   Right 1 Implanted       Procedure(s): CATARACT EXTRACTION PHACO AND INTRAOCULAR LENS PLACEMENT (IOC) RIGHT 4.10 00:41.6 (Right)  DIB00 +19.5   ULTRASOUND TIME: 0 minutes 41 seconds.  CDE 4.10   SURGEON:  Benay Pillow, MD, MPH  ANESTHESIOLOGIST: Anesthesiologist: Ronelle Nigh, MD CRNA: Jeannene Patella, CRNA   ANESTHESIA:  Topical with tetracaine drops augmented with 1% preservative-free intracameral lidocaine.  ESTIMATED BLOOD LOSS: less than 1 mL.   COMPLICATIONS:  None.   DESCRIPTION OF PROCEDURE:  The patient was identified in the holding room and transported to the operating room and placed in the supine position under the operating microscope.  The right eye was identified as the operative eye and it was prepped and draped in the usual sterile ophthalmic fashion.   A 1.0 millimeter clear-corneal paracentesis was made at the 10:30 position. 0.5 ml of preservative-free 1% lidocaine with epinephrine was injected into the anterior chamber.  The anterior chamber was filled with Healon 5 viscoelastic.  A 2.4 millimeter keratome was used to make a near-clear corneal incision at the 8:00 position.  A curvilinear capsulorrhexis was made with a cystotome and capsulorrhexis forceps.  Balanced salt solution was used to hydrodissect and hydrodelineate the nucleus.   Phacoemulsification was then used in stop and chop fashion to remove the lens nucleus and epinucleus.  The remaining cortex  was then removed using the irrigation and aspiration handpiece. Healon was then placed into the capsular bag to distend it for lens placement.  A lens was then injected into the capsular bag.  The remaining viscoelastic was aspirated.   Wounds were hydrated with balanced salt solution.  The anterior chamber was inflated to a physiologic pressure with balanced salt solution.   Intracameral vigamox 0.1 mL undiluted was injected into the eye and a drop placed onto the ocular surface.  No wound leaks were noted.  The patient was taken to the recovery room in stable condition without complications of anesthesia or surgery  Benay Pillow 08/27/2020, 1:05 PM

## 2020-08-27 NOTE — Anesthesia Preprocedure Evaluation (Signed)
Anesthesia Evaluation  Patient identified by MRN, date of birth, ID band Patient awake    Reviewed: Allergy & Precautions, NPO status , Patient's Chart, lab work & pertinent test results  Airway Mallampati: II  TM Distance: >3 FB Neck ROM: Full    Dental no notable dental hx.    Pulmonary sleep apnea (Patient states he is suppose to use BiPAP, but does not tolerate it) and Continuous Positive Airway Pressure Ventilation ,    Pulmonary exam normal        Cardiovascular hypertension, Normal cardiovascular exam     Neuro/Psych    GI/Hepatic GERD  Controlled,  Endo/Other  Morbid obesity (BMI 50)  Renal/GU      Musculoskeletal  (+) Arthritis ,   Abdominal (+) + obese,   Peds  Hematology   Anesthesia Other Findings   Reproductive/Obstetrics                             Anesthesia Physical  Anesthesia Plan  ASA: III  Anesthesia Plan: MAC   Post-op Pain Management:    Induction: Intravenous  PONV Risk Score and Plan: 1 and Treatment may vary due to age or medical condition, TIVA and Midazolam  Airway Management Planned: Natural Airway and Nasal Cannula  Additional Equipment: None  Intra-op Plan:   Post-operative Plan:   Informed Consent: I have reviewed the patients History and Physical, chart, labs and discussed the procedure including the risks, benefits and alternatives for the proposed anesthesia with the patient or authorized representative who has indicated his/her understanding and acceptance.     Dental advisory given  Plan Discussed with: CRNA  Anesthesia Plan Comments:         Anesthesia Quick Evaluation

## 2020-10-05 ENCOUNTER — Encounter: Payer: Self-pay | Admitting: Radiology

## 2020-10-05 ENCOUNTER — Emergency Department: Payer: Medicare Other

## 2020-10-05 ENCOUNTER — Other Ambulatory Visit: Payer: Self-pay

## 2020-10-05 ENCOUNTER — Inpatient Hospital Stay
Admission: EM | Admit: 2020-10-05 | Discharge: 2020-10-07 | DRG: 189 | Disposition: A | Discharge status: Home / Self Care | Payer: Medicare Other | Attending: Hospitalist | Admitting: Hospitalist

## 2020-10-05 DIAGNOSIS — Z79899 Other long term (current) drug therapy: Secondary | ICD-10-CM

## 2020-10-05 DIAGNOSIS — Z743 Need for continuous supervision: Secondary | ICD-10-CM | POA: Diagnosis not present

## 2020-10-05 DIAGNOSIS — R0600 Dyspnea, unspecified: Secondary | ICD-10-CM | POA: Diagnosis not present

## 2020-10-05 DIAGNOSIS — J9601 Acute respiratory failure with hypoxia: Secondary | ICD-10-CM | POA: Diagnosis not present

## 2020-10-05 DIAGNOSIS — K219 Gastro-esophageal reflux disease without esophagitis: Secondary | ICD-10-CM | POA: Diagnosis present

## 2020-10-05 DIAGNOSIS — R0602 Shortness of breath: Secondary | ICD-10-CM

## 2020-10-05 DIAGNOSIS — R6510 Systemic inflammatory response syndrome (SIRS) of non-infectious origin without acute organ dysfunction: Secondary | ICD-10-CM | POA: Diagnosis not present

## 2020-10-05 DIAGNOSIS — Z9119 Patient's noncompliance with other medical treatment and regimen: Secondary | ICD-10-CM | POA: Diagnosis not present

## 2020-10-05 DIAGNOSIS — I10 Essential (primary) hypertension: Secondary | ICD-10-CM | POA: Diagnosis present

## 2020-10-05 DIAGNOSIS — J42 Unspecified chronic bronchitis: Secondary | ICD-10-CM

## 2020-10-05 DIAGNOSIS — J9621 Acute and chronic respiratory failure with hypoxia: Secondary | ICD-10-CM | POA: Diagnosis not present

## 2020-10-05 DIAGNOSIS — J9611 Chronic respiratory failure with hypoxia: Secondary | ICD-10-CM | POA: Diagnosis not present

## 2020-10-05 DIAGNOSIS — G4733 Obstructive sleep apnea (adult) (pediatric): Secondary | ICD-10-CM | POA: Diagnosis present

## 2020-10-05 DIAGNOSIS — R509 Fever, unspecified: Secondary | ICD-10-CM | POA: Diagnosis not present

## 2020-10-05 DIAGNOSIS — Z6841 Body Mass Index (BMI) 40.0 and over, adult: Secondary | ICD-10-CM

## 2020-10-05 DIAGNOSIS — Z20822 Contact with and (suspected) exposure to covid-19: Secondary | ICD-10-CM | POA: Diagnosis present

## 2020-10-05 DIAGNOSIS — R110 Nausea: Secondary | ICD-10-CM | POA: Diagnosis not present

## 2020-10-05 DIAGNOSIS — I878 Other specified disorders of veins: Secondary | ICD-10-CM | POA: Diagnosis present

## 2020-10-05 DIAGNOSIS — I872 Venous insufficiency (chronic) (peripheral): Secondary | ICD-10-CM | POA: Diagnosis present

## 2020-10-05 DIAGNOSIS — J189 Pneumonia, unspecified organism: Secondary | ICD-10-CM | POA: Diagnosis present

## 2020-10-05 DIAGNOSIS — J40 Bronchitis, not specified as acute or chronic: Secondary | ICD-10-CM | POA: Diagnosis not present

## 2020-10-05 DIAGNOSIS — R0902 Hypoxemia: Secondary | ICD-10-CM | POA: Diagnosis not present

## 2020-10-05 DIAGNOSIS — A419 Sepsis, unspecified organism: Secondary | ICD-10-CM | POA: Diagnosis not present

## 2020-10-05 LAB — COMPREHENSIVE METABOLIC PANEL
ALT: 11 U/L (ref 0–44)
AST: 20 U/L (ref 15–41)
Albumin: 3.1 g/dL — ABNORMAL LOW (ref 3.5–5.0)
Alkaline Phosphatase: 45 U/L (ref 38–126)
Anion gap: 10 (ref 5–15)
BUN: 14 mg/dL (ref 8–23)
CO2: 24 mmol/L (ref 22–32)
Calcium: 8.3 mg/dL — ABNORMAL LOW (ref 8.9–10.3)
Chloride: 105 mmol/L (ref 98–111)
Creatinine, Ser: 0.92 mg/dL (ref 0.61–1.24)
GFR, Estimated: >60 mL/min (ref >60)
Glucose, Bld: 109 mg/dL — ABNORMAL HIGH (ref 70–99)
Potassium: 3.7 mmol/L (ref 3.5–5.1)
Sodium: 139 mmol/L (ref 135–145)
Total Bilirubin: 0.7 mg/dL (ref 0.3–1.2)
Total Protein: 6.1 g/dL — ABNORMAL LOW (ref 6.5–8.1)

## 2020-10-05 LAB — CBC WITH DIFFERENTIAL/PLATELET
Abs Immature Granulocytes: 0.05 10*3/uL (ref 0.00–0.07)
Basophils Absolute: 0 10*3/uL (ref 0.0–0.1)
Basophils Relative: 0 %
Eosinophils Absolute: 0 10*3/uL (ref 0.0–0.5)
Eosinophils Relative: 0 %
HCT: 42 % (ref 39.0–52.0)
Hemoglobin: 13.7 g/dL (ref 13.0–17.0)
Immature Granulocytes: 0 %
Lymphocytes Relative: 6 %
Lymphs Abs: 0.8 10*3/uL (ref 0.7–4.0)
MCH: 26.7 pg (ref 26.0–34.0)
MCHC: 32.6 g/dL (ref 30.0–36.0)
MCV: 81.7 fL (ref 80.0–100.0)
Monocytes Absolute: 0.5 10*3/uL (ref 0.1–1.0)
Monocytes Relative: 4 %
Neutro Abs: 12.3 10*3/uL — ABNORMAL HIGH (ref 1.7–7.7)
Neutrophils Relative %: 90 %
Platelets: 215 10*3/uL (ref 150–400)
RBC: 5.14 MIL/uL (ref 4.22–5.81)
RDW: 14.3 % (ref 11.5–15.5)
WBC: 13.7 10*3/uL — ABNORMAL HIGH (ref 4.0–10.5)
nRBC: 0 % (ref 0.0–0.2)

## 2020-10-05 LAB — HIV ANTIBODY (ROUTINE TESTING W REFLEX): HIV Screen 4th Generation wRfx: NONREACTIVE

## 2020-10-05 LAB — URINALYSIS, COMPLETE (UACMP) WITH MICROSCOPIC
Bacteria, UA: NONE SEEN
Bilirubin Urine: NEGATIVE
Glucose, UA: NEGATIVE mg/dL
Hgb urine dipstick: NEGATIVE
Ketones, ur: NEGATIVE mg/dL
Leukocytes,Ua: NEGATIVE
Nitrite: NEGATIVE
Protein, ur: NEGATIVE mg/dL
Specific Gravity, Urine: 1.019 (ref 1.005–1.030)
Squamous Epithelial / HPF: NONE SEEN (ref 0–5)
pH: 5 (ref 5.0–8.0)

## 2020-10-05 LAB — CBC
HCT: 40 % (ref 39.0–52.0)
Hemoglobin: 13.1 g/dL (ref 13.0–17.0)
MCH: 26.9 pg (ref 26.0–34.0)
MCHC: 32.8 g/dL (ref 30.0–36.0)
MCV: 82.1 fL (ref 80.0–100.0)
Platelets: 202 10*3/uL (ref 150–400)
RBC: 4.87 MIL/uL (ref 4.22–5.81)
RDW: 14.6 % (ref 11.5–15.5)
WBC: 18.2 10*3/uL — ABNORMAL HIGH (ref 4.0–10.5)
nRBC: 0 % (ref 0.0–0.2)

## 2020-10-05 LAB — PROTIME-INR
INR: 1.2 (ref 0.8–1.2)
Prothrombin Time: 14.9 seconds (ref 11.4–15.2)

## 2020-10-05 LAB — BRAIN NATRIURETIC PEPTIDE: B Natriuretic Peptide: 76.7 pg/mL (ref 0.0–100.0)

## 2020-10-05 LAB — RESP PANEL BY RT-PCR (FLU A&B, COVID) ARPGX2
Influenza A by PCR: NEGATIVE
Influenza B by PCR: NEGATIVE
SARS Coronavirus 2 by RT PCR: NEGATIVE

## 2020-10-05 LAB — APTT: aPTT: 26 seconds (ref 24–36)

## 2020-10-05 LAB — CREATININE, SERUM
Creatinine, Ser: 1.02 mg/dL (ref 0.61–1.24)
GFR, Estimated: >60 mL/min (ref >60)

## 2020-10-05 LAB — LACTIC ACID, PLASMA
Lactic Acid, Venous: 2.4 mmol/L (ref 0.5–1.9)
Lactic Acid, Venous: 2.4 mmol/L (ref 0.5–1.9)

## 2020-10-05 LAB — TROPONIN I (HIGH SENSITIVITY): Troponin I (High Sensitivity): 10 ng/L (ref <18)

## 2020-10-05 MED ORDER — SODIUM CHLORIDE 0.9 % IV BOLUS
1000.0000 mL | Freq: Once | INTRAVENOUS | Status: AC
  Administered 2020-10-05: 1000 mL via INTRAVENOUS

## 2020-10-05 MED ORDER — IOHEXOL 350 MG/ML SOLN
100.0000 mL | Freq: Once | INTRAVENOUS | Status: AC | PRN
  Administered 2020-10-05: 100 mL via INTRAVENOUS

## 2020-10-05 MED ORDER — SODIUM CHLORIDE 0.9 % IV SOLN
2.0000 g | INTRAVENOUS | Status: DC
  Administered 2020-10-05: 2 g via INTRAVENOUS
  Filled 2020-10-05 (×2): qty 20

## 2020-10-05 MED ORDER — ENOXAPARIN SODIUM 40 MG/0.4ML ~~LOC~~ SOLN: 40.0000 mg | SUBCUTANEOUS | Status: DC

## 2020-10-05 MED ORDER — ENOXAPARIN SODIUM 100 MG/ML ~~LOC~~ SOLN
100.0000 mg | SUBCUTANEOUS | Status: DC
Start: 2020-10-05 — End: 2020-10-07
  Administered 2020-10-05 – 2020-10-06 (×2): 100 mg via SUBCUTANEOUS
  Filled 2020-10-05 (×4): qty 1

## 2020-10-05 MED ORDER — SODIUM CHLORIDE 0.9 % IV SOLN
500.0000 mg | INTRAVENOUS | Status: DC
  Administered 2020-10-05: 500 mg via INTRAVENOUS
  Filled 2020-10-05 (×2): qty 500

## 2020-10-05 MED ORDER — HYDRALAZINE HCL 20 MG/ML IJ SOLN: 5.0000 mg | Freq: Four times a day (QID) | INTRAMUSCULAR | Status: DC | PRN

## 2020-10-05 MED ORDER — IPRATROPIUM-ALBUTEROL 0.5-2.5 (3) MG/3ML IN SOLN: 3.0000 mL | Freq: Four times a day (QID) | RESPIRATORY_TRACT | Status: DC | PRN

## 2020-10-05 MED ORDER — GUAIFENESIN 100 MG/5ML PO SOLN: 5.0000 mL | ORAL | Status: DC | PRN

## 2020-10-05 MED ORDER — PANTOPRAZOLE SODIUM 40 MG PO TBEC
40.0000 mg | DELAYED_RELEASE_TABLET | Freq: Every day | ORAL | Status: DC
  Administered 2020-10-05 – 2020-10-07 (×3): 40 mg via ORAL
  Filled 2020-10-05 (×3): qty 1

## 2020-10-05 MED ORDER — ONDANSETRON HCL 4 MG/2ML IJ SOLN: 4.0000 mg | Freq: Four times a day (QID) | INTRAMUSCULAR | Status: DC | PRN

## 2020-10-05 MED ORDER — METOPROLOL SUCCINATE ER 25 MG PO TB24
12.5000 mg | ORAL_TABLET | Freq: Every day | ORAL | Status: DC
  Administered 2020-10-06 – 2020-10-07 (×2): 12.5 mg via ORAL
  Filled 2020-10-05 (×2): qty 1

## 2020-10-05 MED ORDER — ACETAMINOPHEN 325 MG PO TABS
650.0000 mg | ORAL_TABLET | Freq: Four times a day (QID) | ORAL | Status: DC | PRN
  Administered 2020-10-06: 650 mg via ORAL
  Filled 2020-10-05: qty 2

## 2020-10-05 NOTE — Progress Notes (Signed)
PHARMACIST - PHYSICIAN COMMUNICATION  CONCERNING:  Enoxaparin (Lovenox) for DVT Prophylaxis    RECOMMENDATION: Patient was prescribed enoxaprin 40mg  q24 hours for VTE prophylaxis.   Filed Weights   10/05/20 1005  Weight: (!) 215.5 kg (475 lb)    Body mass index is 62.67 kg/m.  Estimated Creatinine Clearance: 133.6 mL/min (by C-G formula based on SCr of 0.92 mg/dL).   Based on [place] patient is candidate for enoxaparin 0.5mg /kg TBW SQ every 24 hours based on BMI being >30.  DESCRIPTION: Pharmacy has adjusted enoxaparin dose per [facility] policy.  Patient is now receiving enoxaparin 100 mg every 24 hours    Berta Minor, PharmD Clinical Pharmacist  10/05/2020 2:22 PM

## 2020-10-05 NOTE — ED Notes (Signed)
Patient repositioned on stretcher. Pillows placed under legs. B/P cuff repositioned. Leads were replaced.

## 2020-10-05 NOTE — ED Notes (Signed)
Patient was found sitting on the end of the stretcher, stating he was uncomfortable. Patient agreed to be repositioned on the stretcher and stay.

## 2020-10-05 NOTE — ED Notes (Signed)
Waiver not signed at end of triage due to wife reporting AMS, however pt seems to be a&o x 4 on arrival

## 2020-10-05 NOTE — Progress Notes (Signed)
[facility] is following Code Sepsis 

## 2020-10-05 NOTE — ED Provider Notes (Signed)
[facility] Emergency Department Provider Note   ____________________________________________   Event Date/Time   First MD Initiated Contact with Patient 10/05/20 1413     (approximate)  I have reviewed the triage vital signs and the nursing notes.   HISTORY  Chief Complaint Shortness of Breath    HPI Ernest [name] is a 75 y.o. male with a stated past medical history of chronic venous insufficiency of the lower extremities, GERD, sleep apnea, and morbid obesity who presents for worsening shortness of breath via EMS from home.  Patient states that he is breathing like this for the last year and states "I do not want to be here" however EMS state that patient's wife called them reporting altered mental status, nausea, vomiting, and worsening work of breathing.  EMS found patient to be at 88% on room air and placed on 3 L nasal cannula with increased to the 94 percentile.  They also found patient to be febrile to 100.8.  Patient currently denies any vision changes, tinnitus, difficulty speaking, facial droop, sore throat, chest pain, abdominal pain, diarrhea, dysuria, or weakness/numbness/paresthesias in any extremity         Past Medical History:  Diagnosis Date  . Chronic venous insufficiency of lower extremity   . GERD (gastroesophageal reflux disease)   . Sleep apnea     Patient Active Problem List   Diagnosis Date Noted  . CAP (community acquired pneumonia) 10/05/2020  . Swollen lymph nodes 07/25/2020  . Essential hypertension 06/25/2020  . Abdominal pain 06/25/2020  . SVT (supraventricular tachycardia) ([place]) 06/25/2020  . Morbid obesity due to excess calories ([place]) 06/25/2020    Past Surgical History:  Procedure Laterality Date  . CATARACT EXTRACTION W/PHACO Left 08/06/2020   Procedure: CATARACT EXTRACTION PHACO AND INTRAOCULAR LENS PLACEMENT ([place]) LEFT;  Surgeon: Eulogio Bear, MD;  Location: [place];  Service:  Ophthalmology;  Laterality: Left;  3.41 0:31.5  . CATARACT EXTRACTION W/PHACO Right 08/27/2020   Procedure: CATARACT EXTRACTION PHACO AND INTRAOCULAR LENS PLACEMENT ([facility]) RIGHT 4.10 00:41.6;  Surgeon: Eulogio Bear, MD;  Location: [place];  Service: Ophthalmology;  Laterality: Right;  . HERNIA REPAIR     inguinal and umbilical    Prior to Admission medications   Medication Sig Start Date End Date Taking? Authorizing Provider  amiloride-hydrochlorothiazide (MODURETIC) 5-50 MG tablet Take 1 tablet by mouth daily. 11/22/14   [provider]  Azelastine-Fluticasone 137-50 MCG/ACT SUSP Place into the nose.    [provider]  co-enzyme Q-10 30 MG capsule Take 30 mg by mouth 3 (three) times daily.    [provider]  Ginkgo Biloba Extract 60 MG CAPS Take by mouth. Patient not taking: Reported on 07/24/2020    [provider]  levoFLOXacin (LEVAQUIN PO) Take by mouth.    [provider]  metoprolol succinate (TOPROL-XL) 50 MG 24 hr tablet Take 1 tablet (50 mg total) by mouth daily. Take with or immediately following a meal. 06/27/20   [name], MD  Misc Natural Products (OSTEO BI-FLEX JOINT SHIELD PO) Take by mouth.    [provider]  Multiple Vitamins-Minerals (CENTRUM SILVER ADULT 50+ PO) Take by mouth.    [provider]  Multiple Vitamins-Minerals (PRESERVISION AREDS 2) CAPS Take by mouth.    [provider]  nystatin (MYCOSTATIN/NYSTOP) powder Apply 1 application topically 3 (three) times daily. Use after every shower to prevent rash 06/04/20   [name], MD  Omega-3 Fatty Acids (  OMEGA 3 PO) Take by mouth.    [provider]  pantoprazole (PROTONIX) 40 MG tablet TAKE 1 TABLET BY MOUTH EVERY DAY 05/24/20   [name], MD  Probiotic Product (PROBIOTIC DAILY PO) Take by mouth.    [provider]  traMADol (ULTRAM) 50 MG tablet Take 50 mg by mouth 2 (two) times daily. 12/14/14   [provider]    Allergies Patient has no known allergies.  No family history on file.  Social History Social History   Tobacco Use  . Smoking status: Never Smoker  . Smokeless tobacco: Never Used  Substance Use Topics  . Alcohol use: Yes    Alcohol/week: 0.0 standard drinks    Comment: rarely  . Drug use: Not Currently    Review of Systems Constitutional: No fever/chills Eyes: No visual changes. ENT: No sore throat. Cardiovascular: Denies chest pain. Respiratory: Endorses shortness of breath. Gastrointestinal: No abdominal pain.  No nausea, no vomiting.  No diarrhea. Genitourinary: Negative for dysuria. Musculoskeletal: Negative for acute arthralgias Skin: Negative for rash. Neurological: Negative for headaches, weakness/numbness/paresthesias in any extremity Psychiatric: Negative for suicidal ideation/homicidal ideation   ____________________________________________   PHYSICAL EXAM:  VITAL SIGNS: ED Triage Vitals  Enc Vitals Group     BP 10/05/20 1008 105/62     Pulse Rate 10/05/20 1006 (!) 106     Resp 10/05/20 1009 19     Temp 10/05/20 1006 (!) 100.6 F (38.1 C)     Temp Source 10/05/20 1006 Oral     SpO2 10/05/20 1006 95 %     Weight 10/05/20 1005 (!) 475 lb (215.5 kg)     Height 10/05/20 1005 6\' 1"  (1.854 m)     Head Circumference --      Peak Flow --      Pain Score 10/05/20 1005 0     Pain Loc --      Pain Edu? --      Excl. in [place]? --    Constitutional: Alert and oriented. Well appearing and in no acute distress. Eyes: Conjunctivae are normal. PERRL. Head: Atraumatic. Nose: No congestion/rhinnorhea. Mouth/Throat: Mucous membranes are moist. Neck: No stridor Cardiovascular: Grossly normal heart sounds.  Good peripheral circulation. Respiratory: Increased respiratory effort with rales over bilateral lower lung fields Gastrointestinal: Soft and nontender. No distention. Musculoskeletal: No obvious deformities Neurologic:  Normal speech and  language. No gross focal neurologic deficits are appreciated. Skin:  Skin is warm and dry. No rash noted. Psychiatric: Mood and affect are normal. Speech and behavior are normal.  ____________________________________________   LABS (all labs ordered are listed, but only abnormal results are displayed)  Labs Reviewed  LACTIC ACID, PLASMA - Abnormal; Notable for the following components:      Result Value   Lactic Acid, Venous 2.4 (*)    All other components within normal limits  LACTIC ACID, PLASMA - Abnormal; Notable for the following components:   Lactic Acid, Venous 2.4 (*)    All other components within normal limits  COMPREHENSIVE METABOLIC PANEL - Abnormal; Notable for the following components:   Glucose, Bld 109 (*)    Calcium 8.3 (*)    Total Protein 6.1 (*)    Albumin 3.1 (*)    All other components within normal limits  CBC WITH DIFFERENTIAL/PLATELET - Abnormal; Notable for the following components:   WBC 13.7 (*)    Neutro Abs 12.3 (*)    All other components within normal limits  URINALYSIS, COMPLETE (UACMP) WITH  MICROSCOPIC - Abnormal; Notable for the following components:   Color, Urine YELLOW (*)    APPearance CLEAR (*)    All other components within normal limits  RESP PANEL BY RT-PCR (FLU A&B, COVID) ARPGX2  CULTURE, BLOOD (ROUTINE X 2)  CULTURE, BLOOD (ROUTINE X 2)  URINE CULTURE  PROTIME-INR  APTT  BRAIN NATRIURETIC PEPTIDE  HIV ANTIBODY (ROUTINE TESTING W REFLEX)  CBC  CREATININE, SERUM  TROPONIN I (HIGH SENSITIVITY)   ____________________________________________  EKG  ED ECG REPORT I, Naaman Plummer, the attending physician, personally viewed and interpreted this ECG.  Date: 10/05/2020 EKG Time: 1008 Rate: 104 Rhythm: normal sinus rhythm QRS Axis: normal Intervals: normal ST/T Wave abnormalities: normal Narrative Interpretation: no evidence of acute ischemia  ____________________________________________  RADIOLOGY  ED MD  interpretation: Strays shows no evidence of acute abnormalities  Official radiology report(s): DG Chest Port 1 View  Result Date: 10/05/2020 CLINICAL DATA:  Questionable sepsis. EXAM: PORTABLE CHEST 1 VIEW COMPARISON:  None. FINDINGS: Cardiac silhouette top-normal in size. No mediastinal or hilar masses. Clear lungs.  No pleural effusion pneumothorax. Skeletal structures grossly intact. IMPRESSION: No active disease. Electronically Signed   By: Lajean Manes M.D.   On: 10/05/2020 11:36    ____________________________________________   PROCEDURES  Procedure(s) performed (including Critical Care):  .Critical Care Performed by: Naaman Plummer, MD Authorized by: Naaman Plummer, MD   Critical care provider statement:    Critical care time (minutes):  39   Critical care time was exclusive of:  Separately billable procedures and treating other patients   Critical care was necessary to treat or prevent imminent or life-threatening deterioration of the following conditions:  Sepsis   Critical care was time spent personally by me on the following activities:  Discussions with consultants, evaluation of patient's response to treatment, examination of patient, ordering and performing treatments and interventions, ordering and review of laboratory studies, ordering and review of radiographic studies, pulse oximetry, re-evaluation of patient's condition, obtaining history from patient or surrogate and review of old charts   I assumed direction of critical care for this patient from another provider in my specialty: no     Care discussed with: admitting provider       ____________________________________________   INITIAL IMPRESSION / ASSESSMENT AND PLAN / ED COURSE  As part of my medical decision making, I reviewed the following data within the [place] notes reviewed and incorporated, Labs reviewed, EKG interpreted, Old chart reviewed, Radiograph reviewed and Notes  from prior ED visits reviewed and incorporated        Patient is a 75 year old male who presents for shortness of breath and fever.  Differential diagnosis includes but is not limited to: Sepsis, pneumonia, UTI, cholecystitis  Code sepsis activated.  Empiric antibiotics given.  Patient shows no signs of of septic shock and therefore given patient's edema will not be given 30 cc/kg bolus.  Patient's lactate slightly elevated and will receive a small bolus of 500 cc for recheck.  Despite work-up, patient does not have a definitive source for his septic picture.  Patient will require admission to the internal medicine service for further evaluation and management      ____________________________________________   FINAL CLINICAL IMPRESSION(S) / ED DIAGNOSES  Final diagnoses:  SOB (shortness of breath)  Chronic bronchitis, unspecified chronic bronchitis type [facility])     ED Discharge Orders    None       Note:  This document was prepared using  [employer] forces training and education officer and may include unintentional dictation errors.   Naaman Plummer, MD 10/06/20 [phone]

## 2020-10-05 NOTE — ED Notes (Signed)
Patient remains in CT 

## 2020-10-05 NOTE — H&P (Addendum)
History and Physical  Ernest [name] [name] OIZ:[id number] DOB: February 24, 1946 DOA: 10/05/2020  Referring physician: Dr. Cheri Fowler, [place] PCP: [name], MD  Outpatient Specialists: None Patient coming from: Home.  Chief Complaint: Shortness of breath and cough  HPI: Ernest [name] is a 75 y.o. male with medical history significant for severe morbid obesity, chronic venous insufficiency of lower extremities bilaterally, GERD, OSA noncompliant with CPAP, who presented from home with complaints of shortness of breath, productive cough and hypoxia of 1 day duration.  Patient thinks that his symptoms are related to his allergies.  At the time of this visit, patient denies having any shortness of breath, states that his cough was transient and related to his allergies.  Denies having any fevers at home.  Reports having shaking chills on the day of presentation which prompted his wife to call EMS.  Denies dysphagia to solids or liquids.  No chest pain or palpitations.  States he was in his usual state of health prior to this.  He came in to the ED because his wife wanted him to come.  While in the ED work-up revealed concern for early pneumonia.  Code sepsis was called by EDP.  TRH was asked to admit for sepsis secondary to presumptive pneumonia.  ED Course:  Febrile with T-max 100.6.  BP 105/65, heart rate 79, respiratory rate 20, O2 saturation 100% on 3 L.  Lab studies remarkable for WBC 13.7, neutrophilia, lactic acid 2.4, repeated 2.4.  Twelve-lead EKG sinus tachycardia rate of 104, non specific ST-T changes, QTC 434.  Review of Systems: Review of systems as noted in the HPI. All other systems reviewed and are negative.   Past Medical History:  Diagnosis Date  . Chronic venous insufficiency of lower extremity   . GERD (gastroesophageal reflux disease)   . Sleep apnea    Past Surgical History:  Procedure Laterality Date  . CATARACT EXTRACTION W/PHACO Left 08/06/2020   Procedure: CATARACT  EXTRACTION PHACO AND INTRAOCULAR LENS PLACEMENT ([place]) LEFT;  Surgeon: Eulogio Bear, MD;  Location: [place];  Service: Ophthalmology;  Laterality: Left;  3.41 0:31.5  . CATARACT EXTRACTION W/PHACO Right 08/27/2020   Procedure: CATARACT EXTRACTION PHACO AND INTRAOCULAR LENS PLACEMENT ([facility]) RIGHT 4.10 00:41.6;  Surgeon: Eulogio Bear, MD;  Location: [place];  Service: Ophthalmology;  Laterality: Right;  . HERNIA REPAIR     inguinal and umbilical    Social History:  reports that he has never smoked. He has never used smokeless tobacco. He reports current alcohol use. He reports previous drug use.   No Known Allergies  Family history: Mother deceased at the age of 85.  She had macular degeneration.    Prior to Admission medications   Medication Sig Start Date End Date Taking? Authorizing Provider  amiloride-hydrochlorothiazide (MODURETIC) 5-50 MG tablet Take 1 tablet by mouth daily. 11/22/14   [provider]  Azelastine-Fluticasone 137-50 MCG/ACT SUSP Place into the nose.    [provider]  co-enzyme Q-10 30 MG capsule Take 30 mg by mouth 3 (three) times daily.    [provider]  Ginkgo Biloba Extract 60 MG CAPS Take by mouth. Patient not taking: Reported on 07/24/2020    [provider]  levoFLOXacin (LEVAQUIN PO) Take by mouth.    [provider]  metoprolol succinate (TOPROL-XL) 50 MG 24 hr tablet Take 1 tablet (50 mg total) by mouth daily. Take with or immediately following a meal. 06/27/20   [name], MD  Misc Natural Products (OSTEO BI-FLEX JOINT SHIELD PO) Take by mouth.    [provider]  Multiple Vitamins-Minerals (CENTRUM SILVER ADULT 50+ PO) Take by mouth.    [provider]  Multiple Vitamins-Minerals (PRESERVISION AREDS 2) CAPS Take by mouth.    [provider]  nystatin (MYCOSTATIN/NYSTOP) powder Apply 1 application topically 3 (three) times daily. Use after every shower  to prevent rash 06/04/20   [name], MD  Omega-3 Fatty Acids (OMEGA 3 PO) Take by mouth.    [provider]  pantoprazole (PROTONIX) 40 MG tablet TAKE 1 TABLET BY MOUTH EVERY DAY 05/24/20   [name], MD  Probiotic Product (PROBIOTIC DAILY PO) Take by mouth.    [provider]  traMADol (ULTRAM) 50 MG tablet Take 50 mg by mouth 2 (two) times daily. 12/14/14   [provider]    Physical Exam: BP 105/65 (BP Location: Left Arm)   Pulse 79   Temp 98.8 F (37.1 C)   Resp 20   Ht 6\' 1"  (1.854 m)   Wt (!) 215.5 kg   SpO2 100%   BMI 62.67 kg/m   . General: 75 y.o. year-old male well developed well nourished in no acute distress.  Alert and oriented x3. . Cardiovascular: Regular rate and rhythm with no rubs or gallops.  No thyromegaly or JVD noted.  No lower extremity edema. 2/4 pulses in all 4 extremities. Marland Kitchen Respiratory: Mild rales at bases, no wheezing or rales. Good inspiratory effort. . Abdomen: Soft nontender nondistended with normal bowel sounds x4 quadrants. . Muskuloskeletal: No cyanosis, clubbing or edema noted bilaterally . Neuro: CN [name]-XII intact, strength, sensation, reflexes . Skin: No ulcerative lesions noted or rashes . Psychiatry: Judgement and insight appear normal. Mood is appropriate for condition and setting          Labs on Admission:  Basic Metabolic Panel: Recent Labs  Lab 10/05/20 1015  NA 139  K 3.7  CL 105  CO2 24  GLUCOSE 109*  BUN 14  CREATININE 0.92  CALCIUM 8.3*   Liver Function Tests: Recent Labs  Lab 10/05/20 1015  AST 20  ALT 11  ALKPHOS 45  BILITOT 0.7  PROT 6.1*  ALBUMIN 3.1*   No results for input(s): LIPASE, AMYLASE in the last 168 hours. No results for input(s): AMMONIA in the last 168 hours. CBC: Recent Labs  Lab 10/05/20 1015 10/05/20 1638  WBC 13.7* 18.2*  NEUTROABS 12.3*  --   HGB 13.7 13.1  HCT 42.0 40.0  MCV 81.7 82.1  PLT 215 202   Cardiac Enzymes: No results for input(s):  CKTOTAL, CKMB, CKMBINDEX, TROPONINI in the last 168 hours.  BNP (last 3 results) Recent Labs    10/05/20 1015  BNP 76.7    ProBNP (last 3 results) No results for input(s): PROBNP in the last 8760 hours.  CBG: No results for input(s): GLUCAP in the last 168 hours.  Radiological Exams on Admission: CT Angio Chest PE W/Cm &/Or Wo Cm  Result Date: 10/05/2020 CLINICAL DATA:  Short of breath, hypoxia, altered level of consciousness EXAM: CT ANGIOGRAPHY CHEST WITH CONTRAST TECHNIQUE: Multidetector CT imaging of the chest was performed using the standard protocol during bolus administration of intravenous contrast. Multiplanar CT image reconstructions and MIPs were obtained to evaluate the vascular anatomy. CONTRAST:  165mL OMNIPAQUE IOHEXOL 350 MG/ML SOLN COMPARISON:  10/05/2020 FINDINGS: Cardiovascular: This is a technically adequate evaluation of the pulmonary vasculature. No filling defects or pulmonary emboli. The heart is  unremarkable without pericardial effusion. No evidence of thoracic aortic aneurysm or dissection. Mild atherosclerosis of the aortic arch. Mediastinum/Nodes: No enlarged mediastinal, hilar, or axillary lymph nodes. Thyroid gland, trachea, and esophagus demonstrate no significant findings. Lungs/Pleura: No acute airspace disease, effusion, or pneumothorax. The central airways are patent. Upper Abdomen: No acute abnormality. Musculoskeletal: No acute or destructive bony lesions. Reconstructed images demonstrate no additional findings. Review of the MIP images confirms the above findings. IMPRESSION: 1. No evidence of pulmonary embolus. 2. No acute intrathoracic process. 3.  Aortic Atherosclerosis (ICD10-I70.0). Electronically Signed   By: Randa Ngo M.D.   On: 10/05/2020 15:07   DG Chest Port 1 View  Result Date: 10/05/2020 CLINICAL DATA:  Questionable sepsis. EXAM: PORTABLE CHEST 1 VIEW COMPARISON:  None. FINDINGS: Cardiac silhouette top-normal in size. No mediastinal or  hilar masses. Clear lungs.  No pleural effusion pneumothorax. Skeletal structures grossly intact. IMPRESSION: No active disease. Electronically Signed   By: Lajean Manes M.D.   On: 10/05/2020 11:36    EKG: I independently viewed the EKG done and my findings are as followed: sinus tachycardia rate of 104, non specific ST-T changes, QTC 434.  Assessment/Plan Present on Admission: . CAP (community acquired pneumonia)  Active Problems:   CAP (community acquired pneumonia)  Sepsis secondary to presumptive early community-acquired pneumonia, POA. Presented with shortness of breath productive cough and hypoxia of 1 day duration. T-max of 100.6, WBC 13.7K Denies dysphagia to solids or liquids. His wife insisted that he comes to be evaluated in the ED. Chest x-ray and CT chest unrevealing. Code sepsis called in the ED, started on Rocephin and azithromycin empirically and blood cultures x2 and urine culture were obtained. Continue IV antibiotics Monitor fever curve and WBC Obtain procalcitonin level in the morning  Acute hypoxic respiratory failure secondary to above Not on oxygen supplementation at baseline Obtain home oxygen evaluation on 10/06/2020 Maintaining oxygen saturation greater than 90% Bronchodilators as needed Incentive spirometer as tolerated  OSA not compliant with CPAP States he has a CPAP machine at home has not used it in 2 years. Recommend follow-up with pulmonary  Severe morbid obesity BMI 62 Recommend weight loss outpatient with regular physical activity and healthy dieting.  Essential hypertension BP stable Resume home oral antihypertensives.  GERD Stable Resume home PPI   DVT prophylaxis: Subcu Lovenox daily.  Code Status: Full code as stated by the patient himself.  Family Communication: Updated his wife via phone.  Disposition Plan: Admit to MedSurg with remote telemetry.  Consults called: None.  Admission status: Inpatient status.  Patient will  require least 2 midnights for further evaluation and treatment of present condition.   Status is: Inpatient    Dispo: The patient is from: Home.               Anticipated d/c is to: Home.              Patient currently not stable for discharge due to ongoing management of presumptive pneumonia and acute hypoxemic respiratory failure.    Difficult to place patient, not applicable.       Kayleen Memos MD [facility] Pager [phone]  If 7PM-7AM, please contact night-coverage [email or web address] Password [email or web address]  10/05/2020, 5:03 PM

## 2020-10-05 NOTE — Consult Note (Signed)
CODE SEPSIS - PHARMACY COMMUNICATION  **Broad Spectrum Antibiotics should be administered within 1 hour of Sepsis diagnosis**  Time Code Sepsis Called/Page Received: 1101  Antibiotics Ordered: ceftriaxone, azithromycin  Time of 1st antibiotic administration: 1212  Additional action taken by pharmacy: n/a  If necessary, Name of Provider/Nurse Contacted: [place] ,PharmD, BCPS Clinical Pharmacist  10/05/2020  11:11 AM

## 2020-10-05 NOTE — ED Notes (Signed)
Patient given ice chips. 

## 2020-10-05 NOTE — ED Triage Notes (Signed)
Pt arrives via ems from home ems reports called out for sob. pt reported to ems breathing like this for last year and pcp is aware. no diagnosis of lung disease. initial sats 89% RA. Placed on 3L [place] sat incresed to 94%. febil 100.8. pt 975 of tylenol at 0935. wife reported ams, but answering appropriatly to ems. reports nausea and emesis x 1 this morning. Pt denies any SOB, and states this is normal breathing x 1 year.  Provider present for assessment.   iv 20g left hand  ems vitals 110/70 hr 114 cbg 127 T-100.8

## 2020-10-06 DIAGNOSIS — R0600 Dyspnea, unspecified: Secondary | ICD-10-CM | POA: Diagnosis not present

## 2020-10-06 DIAGNOSIS — R6510 Systemic inflammatory response syndrome (SIRS) of non-infectious origin without acute organ dysfunction: Secondary | ICD-10-CM | POA: Diagnosis not present

## 2020-10-06 DIAGNOSIS — G4733 Obstructive sleep apnea (adult) (pediatric): Secondary | ICD-10-CM | POA: Diagnosis not present

## 2020-10-06 LAB — MAGNESIUM: Magnesium: 1.8 mg/dL (ref 1.7–2.4)

## 2020-10-06 LAB — COMPREHENSIVE METABOLIC PANEL
ALT: 13 U/L (ref 0–44)
AST: 24 U/L (ref 15–41)
Albumin: 2.9 g/dL — ABNORMAL LOW (ref 3.5–5.0)
Alkaline Phosphatase: 34 U/L — ABNORMAL LOW (ref 38–126)
Anion gap: 7 (ref 5–15)
BUN: 15 mg/dL (ref 8–23)
CO2: 27 mmol/L (ref 22–32)
Calcium: 8 mg/dL — ABNORMAL LOW (ref 8.9–10.3)
Chloride: 104 mmol/L (ref 98–111)
Creatinine, Ser: 0.84 mg/dL (ref 0.61–1.24)
GFR, Estimated: >60 mL/min (ref >60)
Glucose, Bld: 96 mg/dL (ref 70–99)
Potassium: 3.6 mmol/L (ref 3.5–5.1)
Sodium: 138 mmol/L (ref 135–145)
Total Bilirubin: 0.7 mg/dL (ref 0.3–1.2)
Total Protein: 5.7 g/dL — ABNORMAL LOW (ref 6.5–8.1)

## 2020-10-06 LAB — CBC WITH DIFFERENTIAL/PLATELET
Abs Immature Granulocytes: 0.04 10*3/uL (ref 0.00–0.07)
Basophils Absolute: 0 10*3/uL (ref 0.0–0.1)
Basophils Relative: 0 %
Eosinophils Absolute: 0 10*3/uL (ref 0.0–0.5)
Eosinophils Relative: 0 %
HCT: 35.9 % — ABNORMAL LOW (ref 39.0–52.0)
Hemoglobin: 11.6 g/dL — ABNORMAL LOW (ref 13.0–17.0)
Immature Granulocytes: 0 %
Lymphocytes Relative: 17 %
Lymphs Abs: 1.8 10*3/uL (ref 0.7–4.0)
MCH: 26.7 pg (ref 26.0–34.0)
MCHC: 32.3 g/dL (ref 30.0–36.0)
MCV: 82.5 fL (ref 80.0–100.0)
Monocytes Absolute: 1.4 10*3/uL — ABNORMAL HIGH (ref 0.1–1.0)
Monocytes Relative: 12 %
Neutro Abs: 7.7 10*3/uL (ref 1.7–7.7)
Neutrophils Relative %: 71 %
Platelets: 168 10*3/uL (ref 150–400)
RBC: 4.35 MIL/uL (ref 4.22–5.81)
RDW: 14.6 % (ref 11.5–15.5)
WBC: 10.9 10*3/uL — ABNORMAL HIGH (ref 4.0–10.5)
nRBC: 0 % (ref 0.0–0.2)

## 2020-10-06 LAB — PHOSPHORUS: Phosphorus: 3 mg/dL (ref 2.5–4.6)

## 2020-10-06 LAB — URINE CULTURE: Culture: <10000 — AB

## 2020-10-06 LAB — PROCALCITONIN: Procalcitonin: 11.2 ng/mL

## 2020-10-06 NOTE — Progress Notes (Signed)
SATURATION QUALIFICATIONS: (This note is used to comply with regulatory documentation for home oxygen)  Patient Saturations on Room Air at Rest = 96 %  Patient Saturations on Room Air while Ambulating = 57%  Patient Saturations on  Liters of oxygen while Ambulating =   Please briefly explain why patient needs home oxygen:  Pt SpO2 dropped to 57% while ambulating on Room air. Pt exhibited no c/o and stated he could walk back to room. This RN explained to pt that she could not permit pt to walk to room with SpO2 saturations of 57%. Wheel chair brought to pt. Pt assisted to sit in wheelchair. With in 1 min of sitting pt SpO2 returned to 76%. Pt placed on 2L of O2 [place] SpO2 returned to 98% with in one more adfditional min of sitting on 2L O2.  MD, T. Lai notified in person verbally. Will ween pt to RA again as directed by MD, T. Billie Ruddy.

## 2020-10-06 NOTE — Progress Notes (Signed)
PROGRESS NOTE    Ernest [name]  JKD:[id number] DOB: 04-10-1946 DOA: 10/05/2020 PCP: [name], MD  114A/114A-AA   Assessment & Plan:   Active Problems:   CAP (community acquired pneumonia)   Ernest [name] is a 75 y.o. male with medical history significant for severe morbid obesity, chronic venous insufficiency of lower extremities bilaterally, GERD, OSA noncompliant with CPAP, who presented from home with complaints of shortness of breath, productive cough and hypoxia of 1 day duration.   SIRS Sepsis ruled out PNA, ruled out --T-max of 100.6, WBC 13.7K, however, no source of infection found.  CXR and CT chest all clear with no signs of infection.  Urine neg for infection.  No N/V/D or abdominal pain to suggest GI infection.  No skin lesions or cellulitis. --Procal elevated at 11.2 --s/p ceftriaxone and azithromycin in the ED Plan: --Hold abx and monitor for fever  --follow blood cx  Acute hypoxic respiratory failure  --No signs of PNA.  Imaging of lungs showed no pulm edema.  No hx of COPD or asthma.  Lung exam clear.   --Has been sating well on room air at rest, however, desat to 57% with walking, likely due to severe de-conditioning.  Pt said at baseline, he needs frequent rest with just short distance of walking. Plan: --will try to order home O2 for pt if insurance company will pay for it.  OSA not compliant with home BiPAP States he has a BiPAP machine at home has not used it in 2 years. --hypoxia and hypercapnia during sleep could cause pt to desat and be confused when first awake Plan: --BiPAP nightly   Severe morbid obesity BMI 62 --weight loss discussed  Essential hypertension BP stable --cont home Toprol --hold home amiloride-HCTZ due to soft BP  GERD --cont home PPI  Chronic BLE swelling from venous stasis --pt takes amiloride-HCTZ and compression stockings for his leg swelling. --hold home amiloride-HCTZ due to soft BP --Apply ACE  wrap to both legs   DVT prophylaxis: Lovenox SQ Code Status: Full code  Family Communication: wife updated at the bedside today Level of care: Med-Surg Dispo:   The patient is from: home Anticipated d/c is to: home Anticipated d/c date is: likely tomorrow Patient currently is not medically ready to d/c due to: need to monitor for fever off of abx   Subjective and Interval History:  Pt denied increased dyspnea, N/V/D, cough, pain.  Has chronic leg swelling.  Pt said he has been feeling stressed due to his brother being in ICU with severe brain injury from car accident.  Maintained O2 sats well in 90's while at rest, but dropped to 57% with walking.     Objective: Vitals:   10/06/20 0603 10/06/20 0753 10/06/20 1138 10/06/20 1614  BP: 112/69 110/68 119/75 128/78  Pulse: 74 83 75 71  Resp: 18 17 17 17   Temp: 98.6 F (37 C) 99.5 F (37.5 C) 98.5 F (36.9 C) 98.7 F (37.1 C)  TempSrc: Oral     SpO2: 97% 95% 97% 97%  Weight:      Height:        Intake/Output Summary (Last 24 hours) at 10/06/2020 1907 Last data filed at 10/06/2020 1856 Gross per 24 hour  Intake 720 ml  Output 300 ml  Net 420 ml   Filed Weights   10/05/20 1005  Weight: (!) 215.5 kg    Examination:   Constitutional: NAD, AAOx3 HEENT: conjunctivae and lids normal, EOMI CV: No  cyanosis.   RESP: normal respiratory effort, clear, on RA Extremities: chronic venous stasis edema in BLE SKIN: warm, dry, intact Neuro: [name] - XII grossly intact.   Psych: Normal mood and affect.  Appropriate judgement and reason   Data Reviewed: I have personally reviewed following labs and imaging studies  CBC: Recent Labs  Lab 10/05/20 1015 10/05/20 1638 10/06/20 0555  WBC 13.7* 18.2* 10.9*  NEUTROABS 12.3*  --  7.7  HGB 13.7 13.1 11.6*  HCT 42.0 40.0 35.9*  MCV 81.7 82.1 82.5  PLT 215 202 694   Basic Metabolic Panel: Recent Labs  Lab 10/05/20 1015 10/05/20 1638 10/06/20 0555  NA 139  --  138  K 3.7  --   3.6  CL 105  --  104  CO2 24  --  27  GLUCOSE 109*  --  96  BUN 14  --  15  CREATININE 0.92 1.02 0.84  CALCIUM 8.3*  --  8.0*  MG  --   --  1.8  PHOS  --   --  3.0   GFR: Estimated Creatinine Clearance: 146.3 mL/min (by C-G formula based on SCr of 0.84 mg/dL). Liver Function Tests: Recent Labs  Lab 10/05/20 1015 10/06/20 0555  AST 20 24  ALT 11 13  ALKPHOS 45 34*  BILITOT 0.7 0.7  PROT 6.1* 5.7*  ALBUMIN 3.1* 2.9*   No results for input(s): LIPASE, AMYLASE in the last 168 hours. No results for input(s): AMMONIA in the last 168 hours. Coagulation Profile: Recent Labs  Lab 10/05/20 1015  INR 1.2   Cardiac Enzymes: No results for input(s): CKTOTAL, CKMB, CKMBINDEX, TROPONINI in the last 168 hours. BNP (last 3 results) No results for input(s): PROBNP in the last 8760 hours. HbA1C: No results for input(s): HGBA1C in the last 72 hours. CBG: No results for input(s): GLUCAP in the last 168 hours. Lipid Profile: No results for input(s): CHOL, HDL, LDLCALC, TRIG, CHOLHDL, LDLDIRECT in the last 72 hours. Thyroid Function Tests: No results for input(s): TSH, T4TOTAL, FREET4, T3FREE, THYROIDAB in the last 72 hours. Anemia Panel: No results for input(s): VITAMINB12, FOLATE, FERRITIN, TIBC, IRON, RETICCTPCT in the last 72 hours. Sepsis Labs: Recent Labs  Lab 10/05/20 1015 10/05/20 1331 10/06/20 0555  PROCALCITON  --   --  11.20  LATICACIDVEN 2.4* 2.4*  --     Recent Results (from the past 240 hour(s))  Blood Culture (routine x 2)     Status: None (Preliminary result)   Collection Time: 10/05/20 10:15 AM   Specimen: BLOOD  Result Value Ref Range Status   Specimen Description BLOOD RT FOREARM  Final   Special Requests   Final    BOTTLES DRAWN AEROBIC AND ANAEROBIC Blood Culture results may not be optimal due to an excessive volume of blood received in culture bottles   Culture   Final    NO GROWTH < 24 HOURS Performed at [facility], [address],  [place], [place] [address]    Report Status PENDING  Incomplete  Urine culture     Status: Abnormal   Collection Time: 10/05/20 10:15 AM   Specimen: Urine, Random  Result Value Ref Range Status   Specimen Description   Final    URINE, RANDOM Performed at [facility], [address], [place], [place] [address]    Special Requests   Final    NONE Performed at [facility], [address] [place], [place] [address]    Culture (A)  Final    <10,000 COLONIES/mL INSIGNIFICANT GROWTH Performed at [facility] [address], [place], [place] [address]    Report Status 10/06/2020 FINAL  Final  Resp Panel by RT-PCR (Flu A&B, Covid) Nasopharyngeal Swab     Status: None   Collection Time: 10/05/20 11:40 AM   Specimen: Nasopharyngeal Swab; Nasopharyngeal(NP) swabs in vial transport medium  Result Value Ref Range Status   SARS Coronavirus 2 by RT PCR NEGATIVE NEGATIVE Final    Comment: (NOTE) SARS-CoV-2 target nucleic acids are NOT DETECTED.  The SARS-CoV-2 RNA is generally detectable in upper respiratory specimens during the acute phase of infection. The lowest concentration of SARS-CoV-2 viral copies this assay can detect is 138 copies/mL. A negative result does not preclude SARS-Cov-2 infection and should not be used as the sole basis for treatment or other patient management decisions. A negative result may occur with  improper specimen collection/handling, submission of specimen other than nasopharyngeal swab, presence of viral mutation(s) within the areas targeted by this assay, and inadequate number of viral copies(<138 copies/mL). A negative result must be combined with clinical observations, patient history, and epidemiological information. The expected result is Negative.  Fact Sheet for Patients:  EntrepreneurPulse.com.au  Fact Sheet for Healthcare Providers:  IncredibleEmployment.be  This test is no t yet approved  or cleared by the [place] FDA and  has been authorized for detection and/or diagnosis of SARS-CoV-2 by FDA under an Emergency Use Authorization (EUA). This EUA will remain  in effect (meaning this test can be used) for the duration of the COVID-19 declaration under Section 564(b)(1) of the Act, 21 U.S.C.section 360bbb-3(b)(1), unless the authorization is terminated  or revoked sooner.       Influenza A by PCR NEGATIVE NEGATIVE Final   Influenza B by PCR NEGATIVE NEGATIVE Final    Comment: (NOTE) The Xpert Xpress SARS-CoV-2/FLU/RSV plus assay is intended as an aid in the diagnosis of influenza from Nasopharyngeal swab specimens and should not be used as a sole basis for treatment. Nasal washings and aspirates are unacceptable for Xpert Xpress SARS-CoV-2/FLU/RSV testing.  Fact Sheet for Patients: EntrepreneurPulse.com.au  Fact Sheet for Healthcare Providers: IncredibleEmployment.be  This test is not yet approved or cleared by the [place] FDA and has been authorized for detection and/or diagnosis of SARS-CoV-2 by FDA under an Emergency Use Authorization (EUA). This EUA will remain in effect (meaning this test can be used) for the duration of the COVID-19 declaration under Section 564(b)(1) of the Act, 21 U.S.C. section 360bbb-3(b)(1), unless the authorization is terminated or revoked.  Performed at [facility], [address] [place], [place] [address]   Blood Culture (routine x 2)     Status: None (Preliminary result)   Collection Time: 10/05/20 11:40 AM   Specimen: BLOOD  Result Value Ref Range Status   Specimen Description BLOOD RIGHT ANTECUBITAL  Final   Special Requests   Final    BOTTLES DRAWN AEROBIC AND ANAEROBIC Blood Culture results may not be optimal due to an excessive volume of blood received in culture bottles   Culture   Final    NO GROWTH < 24 HOURS Performed at [facility], [address] [place], [place] [address]    Report Status PENDING  Incomplete      Radiology Studies: CT Angio Chest PE W/Cm &/Or Wo Cm  Result Date: 10/05/2020 CLINICAL DATA:  Short of breath, hypoxia, altered level of consciousness EXAM: CT ANGIOGRAPHY CHEST WITH CONTRAST TECHNIQUE: Multidetector  CT imaging of the chest was performed using the standard protocol during bolus administration of intravenous contrast. Multiplanar CT image reconstructions and MIPs were obtained to evaluate the vascular anatomy. CONTRAST:  117mL OMNIPAQUE IOHEXOL 350 MG/ML SOLN COMPARISON:  10/05/2020 FINDINGS: Cardiovascular: This is a technically adequate evaluation of the pulmonary vasculature. No filling defects or pulmonary emboli. The heart is unremarkable without pericardial effusion. No evidence of thoracic aortic aneurysm or dissection. Mild atherosclerosis of the aortic arch. Mediastinum/Nodes: No enlarged mediastinal, hilar, or axillary lymph nodes. Thyroid gland, trachea, and esophagus demonstrate no significant findings. Lungs/Pleura: No acute airspace disease, effusion, or pneumothorax. The central airways are patent. Upper Abdomen: No acute abnormality. Musculoskeletal: No acute or destructive bony lesions. Reconstructed images demonstrate no additional findings. Review of the MIP images confirms the above findings. IMPRESSION: 1. No evidence of pulmonary embolus. 2. No acute intrathoracic process. 3.  Aortic Atherosclerosis (ICD10-I70.0). Electronically Signed   By: Randa Ngo M.D.   On: 10/05/2020 15:07   DG Chest Port 1 View  Result Date: 10/05/2020 CLINICAL DATA:  Questionable sepsis. EXAM: PORTABLE CHEST 1 VIEW COMPARISON:  None. FINDINGS: Cardiac silhouette top-normal in size. No mediastinal or hilar masses. Clear lungs.  No pleural effusion pneumothorax. Skeletal structures grossly intact. IMPRESSION: No active disease. Electronically Signed   By: Lajean Manes M.D.   On: 10/05/2020 11:36     Scheduled  Meds: . enoxaparin (LOVENOX) injection  100 mg Subcutaneous Q24H  . metoprolol succinate  12.5 mg Oral Daily  . pantoprazole  40 mg Oral Daily   Continuous Infusions:   LOS: 1 day     Enzo Bi, MD [facility] If 7PM-7AM, please contact night-coverage 10/06/2020, 7:07 PM

## 2020-10-07 DIAGNOSIS — J9611 Chronic respiratory failure with hypoxia: Secondary | ICD-10-CM

## 2020-10-07 LAB — CBC
HCT: 36.5 % — ABNORMAL LOW (ref 39.0–52.0)
Hemoglobin: 12.1 g/dL — ABNORMAL LOW (ref 13.0–17.0)
MCH: 26.9 pg (ref 26.0–34.0)
MCHC: 33.2 g/dL (ref 30.0–36.0)
MCV: 81.1 fL (ref 80.0–100.0)
Platelets: 169 10*3/uL (ref 150–400)
RBC: 4.5 MIL/uL (ref 4.22–5.81)
RDW: 14.4 % (ref 11.5–15.5)
WBC: 9.2 10*3/uL (ref 4.0–10.5)
nRBC: 0 % (ref 0.0–0.2)

## 2020-10-07 LAB — BASIC METABOLIC PANEL
Anion gap: 9 (ref 5–15)
BUN: 12 mg/dL (ref 8–23)
CO2: 28 mmol/L (ref 22–32)
Calcium: 7.9 mg/dL — ABNORMAL LOW (ref 8.9–10.3)
Chloride: 101 mmol/L (ref 98–111)
Creatinine, Ser: 0.85 mg/dL (ref 0.61–1.24)
GFR, Estimated: >60 mL/min (ref >60)
Glucose, Bld: 103 mg/dL — ABNORMAL HIGH (ref 70–99)
Potassium: 3.5 mmol/L (ref 3.5–5.1)
Sodium: 138 mmol/L (ref 135–145)

## 2020-10-07 LAB — LACTIC ACID, PLASMA: Lactic Acid, Venous: 1.2 mmol/L (ref 0.5–1.9)

## 2020-10-07 LAB — MAGNESIUM: Magnesium: 1.8 mg/dL (ref 1.7–2.4)

## 2020-10-07 MED ORDER — METOPROLOL SUCCINATE ER 25 MG PO TB24: 25.0000 mg | ORAL_TABLET | Freq: Every day | ORAL | 0 refills | Status: DC

## 2020-10-07 MED ORDER — AMILORIDE-HYDROCHLOROTHIAZIDE 5-50 MG PO TABS
ORAL_TABLET | ORAL | 6 refills | Status: DC
Start: 2020-10-07 — End: 2020-11-22

## 2020-10-07 NOTE — TOC Transition Note (Signed)
Transition of Care [facility]) - CM/SW Discharge Note   Patient Details  Name: Ernest [name] MRN: [id number] Date of Birth: Apr 25, 1946  [facility] CM/SW Contact:  Izola Price, RN Phone Number: 10/07/2020, 11:24 AM   Clinical Narrative: Patient qualified for home oxygen based on desaturation during exertion. [employer] accepted patient and arranged for transport oxygen tank, and number to call when patient gets to his house. Spoke with patient on the phone to confirm and if any questions or concerns. None expressed and verbally demonstrated understanding of plan.  Patient was waiting on spouse to get out of church for transportation home. Will [employer] with number provided on transport tank to complete home set up. Simmie Davies RN CM [phone] am [phone]    Final next level of care: Home/Self Care Barriers to Discharge: Barriers Resolved   Patient Goals and CMS Choice        Discharge Placement                    Patient and family notified of of transfer: 10/07/20 (Patient aware of discharge planning and oxygen set up.)  Discharge Plan and Services                DME Arranged: Oxygen DME Agency:  ([place]) Date DME Agency Contacted: 10/07/20 Time DME Agency Contacted: [phone] Representative spoke with at DME Agency: Brunilda Payor by phone HH Arranged: NA [place] Agency: NA        Social Determinants of Health ([place]) Interventions     Readmission Risk Interventions No flowsheet data found.

## 2020-10-07 NOTE — Discharge Summary (Signed)
Physician Discharge Summary   Ernest [name]  male DOB: 06/29/1945  MWU:[id number]  PCP: [name], MD  Admit date: 10/05/2020 Discharge date: 10/07/2020  Admitted From: home Disposition:  home Wife updated prior to discharge.  CODE STATUS: Full code  Discharge Instructions    Discharge instructions   Complete by: As directed    As we discussed, we have not found a source of infection.  You have no pneumonia or fluid in your lungs.  Your oxygen level drop when you walk or exert yourself likely due to severe deconditioning.  We are sending you home on 2 liters of oxygen to be used when you are up and walking.  Your blood pressure was low normal in the hospital.  I have decreased your Toprol to 25 mg daily (down from 50) and hold your amiloride-hydrochlorothiazide pending followup with your primary care doctor.   Dr. Enzo Bi - -   No wound care   Complete by: As directed        Hospital Course:  For full details, please see H&P, progress notes, consult notes and ancillary notes.  Briefly,  Ernest Arms IIis a 75 y.o.malewith medical history significant forsevere morbid obesity, chronic venous insufficiency of lower extremities bilaterally, GERD, OSA noncompliant with home BiPAP,who presented from home with complaints of shortness of breath,productive cough and hypoxia of 1 day duration.   SIRS Sepsis ruled out PNA, ruled out --T-max of 100.6, WBC 13.7K, however, no source of infection found.  CXR and CT chest all clear with no signs of infection.  Urine neg for infection.  No N/V/D or abdominal pain to suggest GI infection.  No skin lesions or cellulitis. --Procal elevated at 11.2 --s/p ceftriaxone and azithromycin in the ED --abx held and pt monitored for fever.  Pt was discharged after 24 hours of no fever.  Likely chronic hypoxic respiratory failure --No signs of PNA.  Imaging of lungs showed no pulm edema.  No hx of COPD or asthma.  Lung exam  clear.  Pt is morbidly obese and severely deconditioned, and reports that he normally needs to rest after just taking a few steps.   --Pt had been sating well on room air at rest, however, desat to 57% with walking, likely due to severe de-conditioning.  Pt was discharged on 2L O2 for home use.  OSA not compliant with home BiPAP States he has a BiPAP machine at home has not used it in 2 years. --hypoxia and hypercapnia during sleep could cause pt to desat and be confused when first awake.  Pt was more motivated now to use his home BiPAP now.  Severe morbid obesity BMI 62 --weight loss discussed  Essential hypertension, not currently active BP has been low normal in the hospital.  Home Toprol decreased to 25 mg daily (down from 50).   home amiloride-HCTZ held pending outpatient followup.  GERD --cont home PPI  Chronic BLE swelling from venous stasis At home, pt takes amiloride-HCTZ and compression stockings for his leg swelling. home amiloride-HCTZ held pending outpatient followup due to soft BP.   --Apply ACE wrap to both legs to reduce edema, and continue compression stocking at home.   Discharge Diagnoses:  Active Problems:   CAP (community acquired pneumonia)   75 Day Unplanned Readmission Risk Score   Flowsheet Row ED to Hosp-Admission (Current) from 10/05/2020 in [place] (1C)  30 Day Unplanned Readmission Risk Score (%) 13.6 Filed at 10/07/2020 0801  This score is the patient's risk of an unplanned readmission within 30 days of being discharged (0 -100%). The score is based on dignosis, age, lab data, medications, orders, and past utilization.   Low:  0-14.9   Medium: 15-21.9   High: 22-29.9   Extreme: 30 and above        Discharge Instructions:  Allergies as of 10/07/2020   No Known Allergies     Medication List    STOP taking these medications   Ginkgo Biloba Extract 60 MG Caps   LEVAQUIN PO   nystatin  powder Commonly known as: MYCOSTATIN/NYSTOP   traMADol 50 MG tablet Commonly known as: ULTRAM     TAKE these medications   acetaminophen 325 MG tablet Commonly known as: TYLENOL Take 325 mg by mouth every 8 (eight) hours as needed.   amiloride-hydrochlorothiazide 5-50 MG tablet Commonly known as: MODURETIC Hold until followup with outpatient doctor due to your blood pressure being low normal. What changed:   how much to take  how to take this  when to take this  additional instructions   Azelastine-Fluticasone 137-50 MCG/ACT Susp Place into the nose.   co-enzyme Q-10 30 MG capsule Take 30 mg by mouth 3 (three) times daily.   fluticasone 50 MCG/ACT nasal spray Commonly known as: FLONASE Place 1 spray into the nose daily.   metoprolol succinate 25 MG 24 hr tablet Commonly known as: TOPROL-XL Take 1 tablet (25 mg total) by mouth daily. This is a decrease from your prior 50 mg daily. What changed:   medication strength  how much to take  additional instructions   OMEGA 3 PO Take by mouth.   OSTEO BI-FLEX JOINT SHIELD PO Take by mouth.   pantoprazole 40 MG tablet Commonly known as: PROTONIX TAKE 1 TABLET BY MOUTH EVERY DAY   PreserVision AREDS 2 Caps Take by mouth.   CENTRUM SILVER ADULT 50+ PO Take by mouth.   PROBIOTIC DAILY PO Take by mouth.   QC Tumeric Complex 500 MG Caps Generic drug: Turmeric Take 500 mg by mouth daily.            Durable Medical Equipment  (From admission, onward)         Start     Ordered   10/07/20 0945  For home use only DME oxygen  Once       Question Answer Comment  Length of Need 6 Months   Mode or (Route) Nasal cannula   Liters per Minute 2   Frequency Continuous (stationary and portable oxygen unit needed)   Oxygen delivery system Gas      10/07/20 0945           Follow-up Information    Call  Jen Mow, MD.   Specialty: Pulmonary Disease Contact information: [place] [id number] [phone]        [name], MD. Schedule an appointment as soon as possible for a visit in 1 week(s).   Specialties: Internal Medicine, Cardiology Contact information: [place] [address] [phone]               No Known Allergies   The results of significant diagnostics from this hospitalization (including imaging, microbiology, ancillary and laboratory) are listed below for reference.   Consultations:   Procedures/Studies: CT Angio Chest PE W/Cm &/Or Wo Cm  Result Date: 10/05/2020 CLINICAL DATA:  Short of breath, hypoxia, altered level of consciousness EXAM: CT ANGIOGRAPHY CHEST WITH  CONTRAST TECHNIQUE: Multidetector CT imaging of the chest was performed using the standard protocol during bolus administration of intravenous contrast. Multiplanar CT image reconstructions and MIPs were obtained to evaluate the vascular anatomy. CONTRAST:  176mL OMNIPAQUE IOHEXOL 350 MG/ML SOLN COMPARISON:  10/05/2020 FINDINGS: Cardiovascular: This is a technically adequate evaluation of the pulmonary vasculature. No filling defects or pulmonary emboli. The heart is unremarkable without pericardial effusion. No evidence of thoracic aortic aneurysm or dissection. Mild atherosclerosis of the aortic arch. Mediastinum/Nodes: No enlarged mediastinal, hilar, or axillary lymph nodes. Thyroid gland, trachea, and esophagus demonstrate no significant findings. Lungs/Pleura: No acute airspace disease, effusion, or pneumothorax. The central airways are patent. Upper Abdomen: No acute abnormality. Musculoskeletal: No acute or destructive bony lesions. Reconstructed images demonstrate no additional findings. Review of the MIP images confirms the above findings. IMPRESSION: 1. No evidence of pulmonary embolus. 2. No acute intrathoracic process. 3.  Aortic Atherosclerosis (ICD10-I70.0). Electronically Signed   By: Randa Ngo M.D.   On: 10/05/2020 15:07    DG Chest Port 1 View  Result Date: 10/05/2020 CLINICAL DATA:  Questionable sepsis. EXAM: PORTABLE CHEST 1 VIEW COMPARISON:  None. FINDINGS: Cardiac silhouette top-normal in size. No mediastinal or hilar masses. Clear lungs.  No pleural effusion pneumothorax. Skeletal structures grossly intact. IMPRESSION: No active disease. Electronically Signed   By: Lajean Manes M.D.   On: 10/05/2020 11:36      Labs: BNP (last 3 results) Recent Labs    10/05/20 1015  BNP 74.2   Basic Metabolic Panel: Recent Labs  Lab 10/05/20 1015 10/05/20 1638 10/06/20 0555 10/07/20 0407  NA 139  --  138 138  K 3.7  --  3.6 3.5  CL 105  --  104 101  CO2 24  --  27 28  GLUCOSE 109*  --  96 103*  BUN 14  --  15 12  CREATININE 0.92 1.02 0.84 0.85  CALCIUM 8.3*  --  8.0* 7.9*  MG  --   --  1.8 1.8  PHOS  --   --  3.0  --    Liver Function Tests: Recent Labs  Lab 10/05/20 1015 10/06/20 0555  AST 20 24  ALT 11 13  ALKPHOS 45 34*  BILITOT 0.7 0.7  PROT 6.1* 5.7*  ALBUMIN 3.1* 2.9*   No results for input(s): LIPASE, AMYLASE in the last 168 hours. No results for input(s): AMMONIA in the last 168 hours. CBC: Recent Labs  Lab 10/05/20 1015 10/05/20 1638 10/06/20 0555 10/07/20 0407  WBC 13.7* 18.2* 10.9* 9.2  NEUTROABS 12.3*  --  7.7  --   HGB 13.7 13.1 11.6* 12.1*  HCT 42.0 40.0 35.9* 36.5*  MCV 81.7 82.1 82.5 81.1  PLT 215 202 168 169   Cardiac Enzymes: No results for input(s): CKTOTAL, CKMB, CKMBINDEX, TROPONINI in the last 168 hours. BNP: Invalid input(s): POCBNP CBG: No results for input(s): GLUCAP in the last 168 hours. D-Dimer No results for input(s): DDIMER in the last 72 hours. Hgb A1c No results for input(s): HGBA1C in the last 72 hours. Lipid Profile No results for input(s): CHOL, HDL, LDLCALC, TRIG, CHOLHDL, LDLDIRECT in the last 72 hours. Thyroid function studies No results for input(s): TSH, T4TOTAL, T3FREE, THYROIDAB in the last 72 hours.  Invalid input(s):  FREET3 Anemia work up No results for input(s): VITAMINB12, FOLATE, FERRITIN, TIBC, IRON, RETICCTPCT in the last 72 hours. Urinalysis    Component Value Date/Time   COLORURINE YELLOW (A) 10/05/2020 1015   [place] (  A) 10/05/2020 1015   LABSPEC 1.019 10/05/2020 1015   PHURINE 5.0 10/05/2020 1015   GLUCOSEU NEGATIVE 10/05/2020 1015   HGBUR NEGATIVE 10/05/2020 1015   BILIRUBINUR NEGATIVE 10/05/2020 [place] 10/05/2020 1015   PROTEINUR NEGATIVE 10/05/2020 1015   NITRITE NEGATIVE 10/05/2020 1015   LEUKOCYTESUR NEGATIVE 10/05/2020 1015   Sepsis Labs Invalid input(s): PROCALCITONIN,  WBC,  LACTICIDVEN Microbiology Recent Results (from the past 240 hour(s))  Blood Culture (routine x 2)     Status: None (Preliminary result)   Collection Time: 10/05/20 10:15 AM   Specimen: BLOOD  Result Value Ref Range Status   Specimen Description BLOOD RT FOREARM  Final   Special Requests   Final    BOTTLES DRAWN AEROBIC AND ANAEROBIC Blood Culture results may not be optimal due to an excessive volume of blood received in culture bottles   Culture   Final    NO GROWTH 2 DAYS Performed at [facility], [address] [place], [place] [address]    Report Status PENDING  Incomplete  Urine culture     Status: Abnormal   Collection Time: 10/05/20 10:15 AM   Specimen: Urine, Random  Result Value Ref Range Status   Specimen Description   Final    URINE, RANDOM Performed at [facility], [address] [place], [place] [address]    Special Requests   Final    NONE Performed at [facility], [address] [place], [place] [address]    Culture (A)  Final    <10,000 COLONIES/mL INSIGNIFICANT GROWTH Performed at [facility], [place] [address] [place], [place] [address]    Report Status 10/06/2020 FINAL  Final  Resp Panel by RT-PCR (Flu A&B, Covid) Nasopharyngeal Swab     Status: None   Collection Time: 10/05/20 11:40 AM   Specimen:  Nasopharyngeal Swab; Nasopharyngeal(NP) swabs in vial transport medium  Result Value Ref Range Status   SARS Coronavirus 2 by RT PCR NEGATIVE NEGATIVE Final    Comment: (NOTE) SARS-CoV-2 target nucleic acids are NOT DETECTED.  The SARS-CoV-2 RNA is generally detectable in upper respiratory specimens during the acute phase of infection. The lowest concentration of SARS-CoV-2 viral copies this assay can detect is 138 copies/mL. A negative result does not preclude SARS-Cov-2 infection and should not be used as the sole basis for treatment or other patient management decisions. A negative result may occur with  improper specimen collection/handling, submission of specimen other than nasopharyngeal swab, presence of viral mutation(s) within the areas targeted by this assay, and inadequate number of viral copies(<138 copies/mL). A negative result must be combined with clinical observations, patient history, and epidemiological information. The expected result is Negative.  Fact Sheet for Patients:  EntrepreneurPulse.com.au  Fact Sheet for Healthcare Providers:  IncredibleEmployment.be  This test is no t yet approved or cleared by the [place] FDA and  has been authorized for detection and/or diagnosis of SARS-CoV-2 by FDA under an Emergency Use Authorization (EUA). This EUA will remain  in effect (meaning this test can be used) for the duration of the COVID-19 declaration under Section 564(b)(1) of the Act, 21 U.S.C.section 360bbb-3(b)(1), unless the authorization is terminated  or revoked sooner.       Influenza A by PCR NEGATIVE NEGATIVE Final   Influenza B by PCR NEGATIVE NEGATIVE Final    Comment: (NOTE) The Xpert Xpress SARS-CoV-2/FLU/RSV plus assay is intended as an aid in the diagnosis of influenza from Nasopharyngeal swab specimens  and should not be used as a sole basis for treatment. Nasal washings and aspirates are unacceptable for  Xpert Xpress SARS-CoV-2/FLU/RSV testing.  Fact Sheet for Patients: EntrepreneurPulse.com.au  Fact Sheet for Healthcare Providers: IncredibleEmployment.be  This test is not yet approved or cleared by the [place] FDA and has been authorized for detection and/or diagnosis of SARS-CoV-2 by FDA under an Emergency Use Authorization (EUA). This EUA will remain in effect (meaning this test can be used) for the duration of the COVID-19 declaration under Section 564(b)(1) of the Act, 21 U.S.C. section 360bbb-3(b)(1), unless the authorization is terminated or revoked.  Performed at [facility], [address] [place], [place] [address]   Blood Culture (routine x 2)     Status: None (Preliminary result)   Collection Time: 10/05/20 11:40 AM   Specimen: BLOOD  Result Value Ref Range Status   Specimen Description BLOOD RIGHT ANTECUBITAL  Final   Special Requests   Final    BOTTLES DRAWN AEROBIC AND ANAEROBIC Blood Culture results may not be optimal due to an excessive volume of blood received in culture bottles   Culture   Final    NO GROWTH 2 DAYS Performed at [facility], [address], [place], [place] [address]    Report Status PENDING  Incomplete     Total time spend on discharging this patient, including the last patient exam, discussing the hospital stay, instructions for ongoing care as it relates to all pertinent caregivers, as well as preparing the medical discharge records, prescriptions, and/or referrals as applicable, is 50 minutes.    Enzo Bi, MD  [facility] 10/07/2020, 10:07 AM

## 2020-10-10 LAB — CULTURE, BLOOD (ROUTINE X 2)
Culture: NO GROWTH
Culture: NO GROWTH

## 2020-10-16 ENCOUNTER — Other Ambulatory Visit: Payer: Self-pay

## 2020-10-16 ENCOUNTER — Ambulatory Visit (INDEPENDENT_AMBULATORY_CARE_PROVIDER_SITE_OTHER): Payer: Medicare Other | Admitting: Internal Medicine

## 2020-10-16 DIAGNOSIS — I10 Essential (primary) hypertension: Secondary | ICD-10-CM

## 2020-10-16 DIAGNOSIS — K219 Gastro-esophageal reflux disease without esophagitis: Secondary | ICD-10-CM | POA: Diagnosis not present

## 2020-10-16 DIAGNOSIS — R109 Unspecified abdominal pain: Secondary | ICD-10-CM

## 2020-10-16 NOTE — Assessment & Plan Note (Signed)
Patient blood pressure is normal patient denies any chest pain or shortness of breath there is no history of palpitation or paroxysmal nocturnal dyspnea   patient was advised to follow low-salt low-cholesterol diet   

## 2020-10-16 NOTE — Assessment & Plan Note (Addendum)
-   The patient's GERD is not under controll on medication.  - Instructed the patient to avoid eating spicy and acidic foods, as well as foods high in fat. - Instructed the patient to avoid eating large meals or meals 2-3 hours prior to sleeping.

## 2020-10-16 NOTE — Assessment & Plan Note (Signed)

## 2020-10-16 NOTE — Assessment & Plan Note (Signed)
Refer to GI 

## 2020-10-16 NOTE — Progress Notes (Signed)
Established Patient Office Visit  Subjective:  Patient ID: Ernest [name], male    DOB: [birth year]  Age: 75 y.o. MRN: [id number]  CC:  Chief Complaint  Patient presents with  . Hospitalization Follow-up    HPI  Ernest [name] presents for shaking chills, he was worked up in the hospital with possible sepsis but was not found there is no evidence of pneumonia.  He was found to be slightly hypoxic when he walks.  He was given oxygen to be taken when he is ambulatory.  We will send the patient to the pulmonary specialist for further evaluation.  He is not using his CPAP machine so he probably will need one, he denies any chest pain, he came to the office in a wheelchair walker.  He has some swelling of the leg.  Past Medical History:  Diagnosis Date  . Chronic venous insufficiency of lower extremity   . GERD (gastroesophageal reflux disease)   . Sleep apnea     Past Surgical History:  Procedure Laterality Date  . CATARACT EXTRACTION W/PHACO Left 08/06/2020   Procedure: CATARACT EXTRACTION PHACO AND INTRAOCULAR LENS PLACEMENT ([place]) LEFT;  Surgeon: Eulogio Bear, MD;  Location: [facility];  Service: Ophthalmology;  Laterality: Left;  3.41 0:31.5  . CATARACT EXTRACTION W/PHACO Right 08/27/2020   Procedure: CATARACT EXTRACTION PHACO AND INTRAOCULAR LENS PLACEMENT ([facility]) RIGHT 4.10 00:41.6;  Surgeon: Eulogio Bear, MD;  Location: [facility];  Service: Ophthalmology;  Laterality: Right;  . HERNIA REPAIR     inguinal and umbilical    No family history on file.  Social History   Socioeconomic History  . Marital status: Married    Spouse name: Not on file  . Number of children: Not on file  . Years of education: Not on file  . Highest education level: Not on file  Occupational History  . Not on file  Tobacco Use  . Smoking status: Never Smoker  . Smokeless tobacco: Never Used  Substance and Sexual Activity  . Alcohol use: Yes    Alcohol/week:  0.0 standard drinks    Comment: rarely  . Drug use: Not Currently  . Sexual activity: Not on file  Other Topics Concern  . Not on file  Social History Narrative  . Not on file   Social Determinants of Health   Financial Resource Strain: Not on file  Food Insecurity: Not on file  Transportation Needs: Not on file  Physical Activity: Not on file  Stress: Not on file  Social Connections: Not on file  Intimate Partner Violence: Not on file     Current Outpatient Medications:  .  acetaminophen (TYLENOL) 325 MG tablet, Take 325 mg by mouth every 8 (eight) hours as needed., Disp: , Rfl:  .  amiloride-hydrochlorothiazide (MODURETIC) 5-50 MG tablet, Hold until followup with outpatient doctor due to your blood pressure being low normal., Disp: , Rfl: 6 .  Azelastine-Fluticasone 137-50 MCG/ACT SUSP, Place into the nose., Disp: , Rfl:  .  co-enzyme Q-10 30 MG capsule, Take 30 mg by mouth 3 (three) times daily., Disp: , Rfl:  .  fluticasone (FLONASE) 50 MCG/ACT nasal spray, Place 1 spray into the nose daily., Disp: , Rfl:  .  metoprolol succinate (TOPROL-XL) 25 MG 24 hr tablet, Take 1 tablet (25 mg total) by mouth daily. This is a decrease from your prior 50 mg daily., Disp: 30 tablet, Rfl: 0 .  Misc Natural Products (OSTEO BI-FLEX JOINT SHIELD  PO), Take by mouth., Disp: , Rfl:  .  Multiple Vitamins-Minerals (CENTRUM SILVER ADULT 50+ PO), Take by mouth., Disp: , Rfl:  .  Multiple Vitamins-Minerals (PRESERVISION AREDS 2) CAPS, Take by mouth., Disp: , Rfl:  .  Omega-3 Fatty Acids (OMEGA 3 PO), Take by mouth., Disp: , Rfl:  .  pantoprazole (PROTONIX) 40 MG tablet, TAKE 1 TABLET BY MOUTH EVERY DAY, Disp: 90 tablet, Rfl: 3 .  Probiotic Product (PROBIOTIC DAILY PO), Take by mouth., Disp: , Rfl:  .  Turmeric (QC TUMERIC COMPLEX) 500 MG CAPS, Take 500 mg by mouth daily., Disp: , Rfl:    No Known Allergies  ROS Review of Systems  Constitutional: Negative.   HENT: Negative.   Eyes: Negative.    Respiratory: Negative.   Cardiovascular: Negative.   Gastrointestinal: Negative.   Endocrine: Negative.   Genitourinary: Negative.  Negative for flank pain and hematuria.  Musculoskeletal: Negative.  Negative for back pain, gait problem and joint swelling.  Skin: Negative.   Allergic/Immunologic: Negative.   Neurological: Negative.   Hematological: Negative.   Psychiatric/Behavioral: Negative.  Negative for behavioral problems.  All other systems reviewed and are negative.     Objective:    Physical Exam Constitutional:      Appearance: Normal appearance.  HENT:     Head: Normocephalic and atraumatic.     Nose: No congestion.  Musculoskeletal:        General: No tenderness.     Cervical back: Normal range of motion.     Right lower leg: Edema present.     Left lower leg: Edema present.     There were no vitals taken for this visit. Wt Readings from Last 3 Encounters:  10/05/20 (!) 475 lb (215.5 kg)  08/27/20 (!) 396 lb 6.4 oz (179.8 kg)  08/06/20 (!) 384 lb (174.2 kg)     Health Maintenance Due  Topic Date Due  . Hepatitis C Screening  Never done  . TETANUS/TDAP  Never done  . COLONOSCOPY (Pts 45-63yrs Insurance coverage will need to be confirmed)  Never done    There are no preventive care reminders to display for this patient. 's No results found for: TSH Lab Results  Component Value Date   WBC 9.2 10/07/2020   HGB 12.1 (L) 10/07/2020   HCT 36.5 (L) 10/07/2020   MCV 81.1 10/07/2020   PLT 169 10/07/2020   Lab Results  Component Value Date   NA 138 10/07/2020   K 3.5 10/07/2020   CO2 28 10/07/2020   GLUCOSE 103 (H) 10/07/2020   BUN 12 10/07/2020   CREATININE 0.85 10/07/2020   BILITOT 0.7 10/06/2020   ALKPHOS 34 (L) 10/06/2020   AST 24 10/06/2020   ALT 13 10/06/2020   PROT 5.7 (L) 10/06/2020   ALBUMIN 2.9 (L) 10/06/2020   CALCIUM 7.9 (L) 10/07/2020   ANIONGAP 9 10/07/2020   No results found for: CHOL No results found for: HDL No results  found for: LDLCALC No results found for: TRIG No results found for: CHOLHDL No results found for: HGBA1C    Assessment & Plan:   Problem List Items Addressed This Visit      Cardiovascular and Mediastinum   Essential hypertension - Primary    Patient blood pressure is normal patient denies any chest pain or shortness of breath there is no history of palpitation or paroxysmal nocturnal dyspnea   patient was advised to follow low-salt low-cholesterol diet  Digestive   GERD (gastroesophageal reflux disease)    - The patient's GERD is not under controll on medication.  - Instructed the patient to avoid eating spicy and acidic foods, as well as foods high in fat. - Instructed the patient to avoid eating large meals or meals 2-3 hours prior to sleeping.        Other   Abdominal pain    Refer to GI      Morbid obesity due to excess calories ([place])    - I encouraged the patient to lose weight.  - I educated them on making healthy dietary choices including eating more fruits and vegetables and less fried foods. - I encouraged the patient to exercise more, and educated on the benefits of exercise including weight loss, diabetes prevention, and hypertension prevention.   Dietary counseling with a registered dietician  Referral to a weight management support group (e.g. Weight Watchers, Overeaters Anonymous)  If your BMI is greater than 29 or you have gained more than 15 pounds you should work on weight loss.  Attend a healthy cooking class          No orders of the defined types were placed in this encounter. Patient was advised to resume Moduretic 1 pill a day, will be referred to pulmonary for evaluation of the, sleep problem  Follow-up: No follow-ups on file.    [name], MD

## 2020-10-19 ENCOUNTER — Other Ambulatory Visit: Payer: Self-pay

## 2020-10-19 DIAGNOSIS — R0902 Hypoxemia: Secondary | ICD-10-CM

## 2020-10-19 DIAGNOSIS — R109 Unspecified abdominal pain: Secondary | ICD-10-CM

## 2020-10-22 ENCOUNTER — Encounter: Payer: Self-pay | Admitting: *Deleted

## 2020-10-23 ENCOUNTER — Other Ambulatory Visit: Payer: Self-pay

## 2020-10-23 ENCOUNTER — Ambulatory Visit (INDEPENDENT_AMBULATORY_CARE_PROVIDER_SITE_OTHER): Payer: Medicare Other | Admitting: Gastroenterology

## 2020-10-23 ENCOUNTER — Telehealth: Payer: Self-pay | Admitting: Pulmonary Disease

## 2020-10-23 ENCOUNTER — Encounter: Payer: Self-pay | Admitting: Gastroenterology

## 2020-10-23 VITALS — BP 104/74 | HR 125 | Temp 98.3°F | Ht 73.0 in | Wt 397.5 lb

## 2020-10-23 DIAGNOSIS — D649 Anemia, unspecified: Secondary | ICD-10-CM

## 2020-10-23 DIAGNOSIS — R1013 Epigastric pain: Secondary | ICD-10-CM | POA: Diagnosis not present

## 2020-10-23 DIAGNOSIS — G8929 Other chronic pain: Secondary | ICD-10-CM | POA: Diagnosis not present

## 2020-10-23 MED ORDER — NA SULFATE-K SULFATE-MG SULF 17.5-3.13-1.6 GM/177ML PO SOLN: 354.0000 mL | Freq: Once | ORAL | 0 refills | Status: AC

## 2020-10-23 NOTE — Progress Notes (Signed)
[name], MD [address]  [place]  [place], [place] [address]  Main: [phone]  Fax: [phone]    Gastroenterology Consultation  Referring Provider:     [name], MD Primary Care Physician:  [name], MD Primary Gastroenterologist:  Dr. [name] Reason for Consultation:     Upper abdominal pain        HPI:   Ernest [name] is a 75 y.o. male referred by Dr. [name], MD  for consultation & management of upper abdominal pain.  Patient reports that since Christmas, he has been experiencing sporadic episodes of sharp upper abdominal pain, he points towards upper mid abdomen without any radiation.  Pain is triggered after heavy meal or fatty meal.  He feels like a gas bubble sitting in his upper abdomen associated with gurgling.  He has been taking Protonix 40 mg daily.  He has morbid obesity, obstructive sleep apnea, diastolic heart failure.  He is not on home oxygen.  He had ultrasound abdomen which revealed cholelithiasis.  His transaminases were normal as well as alkaline phosphatase.  He is noticed to have mild anemia, MCV normal.  Patient reports that he had upper endoscopy and colonoscopy by [facility] clinic GI more than 20 years ago  NSAIDs: Taking Excedrin for localized left frontal headache for the last 2 days every 6 hours  Antiplts/Anticoagulants/Anti thrombotics: None  GI Procedures: EGD and colonoscopy more than 20 years ago  Past Medical History:  Diagnosis Date  . Chronic venous insufficiency of lower extremity   . GERD (gastroesophageal reflux disease)   . Sleep apnea     Past Surgical History:  Procedure Laterality Date  . CATARACT EXTRACTION W/PHACO Left 08/06/2020   Procedure: CATARACT EXTRACTION PHACO AND INTRAOCULAR LENS PLACEMENT ([place]) LEFT;  Surgeon: Eulogio Bear, MD;  Location: [place];  Service: Ophthalmology;  Laterality: Left;  3.41 0:31.5  . CATARACT EXTRACTION W/PHACO Right 08/27/2020    Procedure: CATARACT EXTRACTION PHACO AND INTRAOCULAR LENS PLACEMENT ([facility]) RIGHT 4.10 00:41.6;  Surgeon: Eulogio Bear, MD;  Location: [place];  Service: Ophthalmology;  Laterality: Right;  . HERNIA REPAIR     inguinal and umbilical    Current Outpatient Medications:  .  acetaminophen (TYLENOL) 325 MG tablet, Take 325 mg by mouth every 8 (eight) hours as needed., Disp: , Rfl:  .  co-enzyme Q-10 30 MG capsule, Take 30 mg by mouth 3 (three) times daily., Disp: , Rfl:  .  fluticasone (FLONASE) 50 MCG/ACT nasal spray, Place 1 spray into the nose daily., Disp: , Rfl:  .  metoprolol succinate (TOPROL-XL) 25 MG 24 hr tablet, Take 1 tablet (25 mg total) by mouth daily. This is a decrease from your prior 50 mg daily., Disp: 30 tablet, Rfl: 0 .  Misc Natural Products (OSTEO BI-FLEX JOINT SHIELD PO), Take by mouth., Disp: , Rfl:  .  Multiple Vitamins-Minerals (CENTRUM SILVER ADULT 50+ PO), Take by mouth., Disp: , Rfl:  .  Multiple Vitamins-Minerals (PRESERVISION AREDS 2) CAPS, Take by mouth., Disp: , Rfl:  .  Na Sulfate-K Sulfate-Mg Sulf 17.5-3.13-1.6 GM/177ML SOLN, Take 354 mLs by mouth once for 1 dose., Disp: 354 mL, Rfl: 0 .  pantoprazole (PROTONIX) 40 MG tablet, TAKE 1 TABLET BY MOUTH EVERY DAY, Disp: 90 tablet, Rfl: 3 .  Probiotic Product (PROBIOTIC DAILY PO), Take by mouth., Disp: , Rfl:  .  Turmeric 500 MG CAPS, Take 500 mg by mouth daily., Disp: , Rfl:  .  amiloride-hydrochlorothiazide (MODURETIC) 5-50 MG tablet, Hold until followup with outpatient doctor due to your blood pressure being low normal. (Patient not taking: Reported on 10/23/2020), Disp: , Rfl: 6  History reviewed. No pertinent family history.   Social History   Tobacco Use  . Smoking status: Never Smoker  . Smokeless tobacco: Never Used  Substance Use Topics  . Alcohol use: Yes    Alcohol/week: 0.0 standard drinks    Comment: rarely  . Drug use: Not Currently    Allergies as of 10/23/2020  . (No Known  Allergies)    Review of Systems:    All systems reviewed and negative except where noted in HPI.   Physical Exam:  BP 104/74 (BP Location: Left Arm, Patient Position: Sitting, Cuff Size: Large)   Pulse (!) 125   Temp 98.3 F (36.8 C) (Oral)   Ht 6\' 1"  (1.854 m)   Wt (!) 397 lb 8 oz (180.3 kg)   BMI 52.44 kg/m  No LMP for male patient.  General:   Alert,  Well-developed, well-nourished, pleasant and cooperative in NAD Head:  Normocephalic and atraumatic. Eyes:  Sclera clear, no icterus.   Conjunctiva pink. Ears:  Normal auditory acuity. Nose:  No deformity, discharge, or lesions. Mouth:  No deformity or lesions,oropharynx pink & moist. Neck:  Supple; no masses or thyromegaly. Lungs:  Respirations even and unlabored.  Clear throughout to auscultation.   No wheezes, crackles, or rhonchi. No acute distress. Heart:  Regular rate and rhythm; no murmurs, clicks, rubs, or gallops. Abdomen:  Normal bowel sounds. Soft, non-tender and non-distended without masses, hepatosplenomegaly or hernias noted.  No guarding or rebound tenderness.   Rectal: Not performed Msk:  Symmetrical without gross deformities. Good, equal movement & strength bilaterally. Pulses:  Normal pulses noted. Extremities:  No clubbing or edema.  No cyanosis. Neurologic:  Alert and oriented x3;  grossly normal neurologically. Skin:  Intact without significant lesions or rashes. No jaundice. Psych:  Alert and cooperative. Normal mood and affect.  Imaging Studies: Reviewed  Assessment and Plan:   Ernest [name] is a 75 y.o. male with morbid obesity, BMI 52 is seen in consultation for sporadic episodes of sharp epigastric pain, history of cholelithiasis, history of Excedrin use  Recommend upper endoscopy for further evaluation if this is negative, will refer to general surgery to evaluate for biliary colic and cholecystectomy Avoid NSAID use Increase Protonix to 40 mg twice daily before meals Trial of simethicone  for gas pain  Given history of anemia and gradually downtrending hemoglobin, and not having colonoscopy for more than 20 years, I have discussed about diagnostic colonoscopy and patient is agreeable Check iron panel, B12 and folate levels   Follow up in 2 months   [name], MD

## 2020-10-29 DIAGNOSIS — H353221 Exudative age-related macular degeneration, left eye, with active choroidal neovascularization: Secondary | ICD-10-CM | POA: Diagnosis not present

## 2020-10-29 NOTE — Telephone Encounter (Signed)
Pt has been scheduled a consult visit with Dr. Patsey Berthold in [place]. Nothing further needed at this time.

## 2020-10-31 ENCOUNTER — Encounter: Payer: Self-pay | Admitting: Gastroenterology

## 2020-11-01 ENCOUNTER — Ambulatory Visit: Payer: Medicare Other | Admitting: Anesthesiology

## 2020-11-01 ENCOUNTER — Encounter: Payer: Self-pay | Admitting: Gastroenterology

## 2020-11-01 ENCOUNTER — Ambulatory Visit
Admission: RE | Admit: 2020-11-01 | Discharge: 2020-11-01 | Disposition: A | Discharge status: Home / Self Care | Payer: Medicare Other | Attending: Gastroenterology | Admitting: Gastroenterology

## 2020-11-01 ENCOUNTER — Encounter
Admission: RE | Disposition: A | Discharge status: Home / Self Care | Payer: Self-pay | Source: Home / Self Care | Attending: Gastroenterology

## 2020-11-01 DIAGNOSIS — K621 Rectal polyp: Secondary | ICD-10-CM

## 2020-11-01 DIAGNOSIS — D509 Iron deficiency anemia, unspecified: Secondary | ICD-10-CM | POA: Diagnosis not present

## 2020-11-01 DIAGNOSIS — D649 Anemia, unspecified: Secondary | ICD-10-CM | POA: Diagnosis not present

## 2020-11-01 DIAGNOSIS — D519 Vitamin B12 deficiency anemia, unspecified: Secondary | ICD-10-CM

## 2020-11-01 DIAGNOSIS — K635 Polyp of colon: Secondary | ICD-10-CM

## 2020-11-01 DIAGNOSIS — K2289 Other specified disease of esophagus: Secondary | ICD-10-CM | POA: Insufficient documentation

## 2020-11-01 DIAGNOSIS — R1013 Epigastric pain: Secondary | ICD-10-CM

## 2020-11-01 DIAGNOSIS — K317 Polyp of stomach and duodenum: Secondary | ICD-10-CM | POA: Insufficient documentation

## 2020-11-01 DIAGNOSIS — K3189 Other diseases of stomach and duodenum: Secondary | ICD-10-CM | POA: Diagnosis not present

## 2020-11-01 DIAGNOSIS — Z79899 Other long term (current) drug therapy: Secondary | ICD-10-CM | POA: Insufficient documentation

## 2020-11-01 DIAGNOSIS — D120 Benign neoplasm of cecum: Secondary | ICD-10-CM | POA: Insufficient documentation

## 2020-11-01 DIAGNOSIS — D123 Benign neoplasm of transverse colon: Secondary | ICD-10-CM | POA: Insufficient documentation

## 2020-11-01 DIAGNOSIS — D122 Benign neoplasm of ascending colon: Secondary | ICD-10-CM | POA: Insufficient documentation

## 2020-11-01 DIAGNOSIS — D128 Benign neoplasm of rectum: Secondary | ICD-10-CM | POA: Diagnosis not present

## 2020-11-01 HISTORY — PX: COLONOSCOPY WITH PROPOFOL: SHX5780

## 2020-11-01 HISTORY — PX: ESOPHAGOGASTRODUODENOSCOPY (EGD) WITH PROPOFOL: SHX5813

## 2020-11-01 SURGERY — COLONOSCOPY WITH PROPOFOL
Anesthesia: General

## 2020-11-01 MED ORDER — LIDOCAINE HCL (CARDIAC) PF 100 MG/5ML IV SOSY
PREFILLED_SYRINGE | INTRAVENOUS | Status: DC | PRN
  Administered 2020-11-01: 80 mg via INTRAVENOUS

## 2020-11-01 MED ORDER — PROPOFOL 500 MG/50ML IV EMUL
INTRAVENOUS | Status: DC | PRN
  Administered 2020-11-01: 200 ug/kg/min via INTRAVENOUS

## 2020-11-01 MED ORDER — PHENYLEPHRINE HCL (PRESSORS) 10 MG/ML IV SOLN
INTRAVENOUS | Status: DC | PRN
  Administered 2020-11-01: 50 ug via INTRAVENOUS

## 2020-11-01 MED ORDER — PROPOFOL 500 MG/50ML IV EMUL
INTRAVENOUS | Status: AC
  Filled 2020-11-01: qty 50

## 2020-11-01 MED ORDER — PROPOFOL 10 MG/ML IV BOLUS
INTRAVENOUS | Status: DC | PRN
  Administered 2020-11-01: 50 mg via INTRAVENOUS

## 2020-11-01 MED ORDER — SODIUM CHLORIDE 0.9 % IV SOLN: INTRAVENOUS | Status: DC

## 2020-11-01 MED ORDER — PROPOFOL 10 MG/ML IV BOLUS
INTRAVENOUS | Status: AC
  Filled 2020-11-01: qty 20

## 2020-11-01 MED ORDER — PHENYLEPHRINE HCL (PRESSORS) 10 MG/ML IV SOLN
INTRAVENOUS | Status: AC
  Filled 2020-11-01: qty 1

## 2020-11-01 NOTE — Anesthesia Preprocedure Evaluation (Signed)
Anesthesia Evaluation  Patient identified by MRN, date of birth, ID band Patient awake    Reviewed: Allergy & Precautions, H&P , NPO status , Patient's Chart, lab work & pertinent test results, reviewed documented beta blocker date and time   Airway Mallampati: II   Neck ROM: full    Dental  (+) Poor Dentition   Pulmonary sleep apnea and Continuous Positive Airway Pressure Ventilation ,    Pulmonary exam normal        Cardiovascular Exercise Tolerance: Poor negative cardio ROS Normal cardiovascular exam Rhythm:regular Rate:Normal     Neuro/Psych negative neurological ROS  negative psych ROS   GI/Hepatic Neg liver ROS, GERD  Medicated,  Endo/Other  Morbid obesity  Renal/GU negative Renal ROS  negative genitourinary   Musculoskeletal   Abdominal   Peds  Hematology negative hematology ROS (+)   Anesthesia Other Findings Past Medical History: No date: Chronic venous insufficiency of lower extremity No date: GERD (gastroesophageal reflux disease) No date: Sleep apnea Past Surgical History: 08/06/2020: CATARACT EXTRACTION W/PHACO; Left     Comment:  Procedure: CATARACT EXTRACTION PHACO AND INTRAOCULAR               LENS PLACEMENT ([place]) LEFT;  Surgeon: Eulogio Bear,               MD;  Location: [place];  Service:               Ophthalmology;  Laterality: Left;  3.41 0:31.5 08/27/2020: CATARACT EXTRACTION W/PHACO; Right     Comment:  Procedure: CATARACT EXTRACTION PHACO AND INTRAOCULAR               LENS PLACEMENT ([facility]) RIGHT 4.10 00:41.6;  Surgeon: Eulogio Bear, MD;  Location: [place];                Service: Ophthalmology;  Laterality: Right; No date: COLONOSCOPY WITH ESOPHAGOGASTRODUODENOSCOPY (EGD) No date: HERNIA REPAIR     Comment:  inguinal and umbilical BMI    Body Mass Index: 50.79 kg/m     Reproductive/Obstetrics negative OB ROS                              Anesthesia Physical Anesthesia Plan  ASA: IV  Anesthesia Plan: General   Post-op Pain Management:    Induction:   PONV Risk Score and Plan:   Airway Management Planned:   Additional Equipment:   Intra-op Plan:   Post-operative Plan:   Informed Consent: I have reviewed the patients History and Physical, chart, labs and discussed the procedure including the risks, benefits and alternatives for the proposed anesthesia with the patient or authorized representative who has indicated his/her understanding and acceptance.     Dental Advisory Given  Plan Discussed with: CRNA  Anesthesia Plan Comments:         Anesthesia Quick Evaluation

## 2020-11-01 NOTE — Op Note (Signed)
[facility] Gastroenterology Patient Name: Ernest [name] Procedure Date: 11/01/2020 10:14 AM MRN: [id number] Account #: [id number] Date of Birth: 06/28/1945 Admit Type: Outpatient Age: 75 Room: [facility] ENDO ROOM 4 Gender: Male Note Status: Finalized Procedure:             Upper GI endoscopy Indications:           Epigastric abdominal pain, Unexplained iron deficiency                         anemia Providers:             Lin Landsman MD, MD Referring MD:          [name], MD (Referring MD) Medicines:             General Anesthesia Complications:         No immediate complications. Estimated blood loss: None. Procedure:             Pre-Anesthesia Assessment:                        - Prior to the procedure, a History and Physical was                         performed, and patient medications and allergies were                         reviewed. The patient is competent. The risks and                         benefits of the procedure and the sedation options and                         risks were discussed with the patient. All questions                         were answered and informed consent was obtained.                         Patient identification and proposed procedure were                         verified by the physician, the nurse, the                         anesthesiologist, the anesthetist and the technician                         in the pre-procedure area in the procedure room in the                         endoscopy suite. Mental Status Examination: alert and                         oriented. Airway Examination: normal oropharyngeal                         airway and neck mobility. Respiratory Examination:  clear to auscultation. CV Examination: normal.                         Prophylactic Antibiotics: The patient does not require                         prophylactic antibiotics. Prior Anticoagulants: The                          patient has taken no previous anticoagulant or                         antiplatelet agents. ASA Grade Assessment: IV - A                         patient with severe systemic disease that is a                         constant threat to life. After reviewing the risks and                         benefits, the patient was deemed in satisfactory                         condition to undergo the procedure. The anesthesia                         plan was to use general anesthesia. Immediately prior                         to administration of medications, the patient was                         re-assessed for adequacy to receive sedatives. The                         heart rate, respiratory rate, oxygen saturations,                         blood pressure, adequacy of pulmonary ventilation, and                         response to care were monitored throughout the                         procedure. The physical status of the patient was                         re-assessed after the procedure.                        After obtaining informed consent, the endoscope was                         passed under direct vision. Throughout the procedure,                         the patient's blood pressure, pulse, and oxygen  saturations were monitored continuously. The Endoscope                         was introduced through the mouth, and advanced to the                         second part of duodenum. The upper GI endoscopy was                         accomplished without difficulty. The patient tolerated                         the procedure well. Findings:      The duodenal bulb and second portion of the duodenum were normal.       Biopsies were taken with a cold forceps for histology.      The entire examined stomach was normal. Biopsies were taken with a cold       forceps for Helicobacter pylori testing.      Esophagogastric landmarks were identified: the  gastroesophageal junction       was found at 45 cm from the incisors.      The Z-line was irregular and was found 45 cm from the incisors.      The examined esophagus was normal.      Multiple diminutive sessile polyps with no bleeding and no stigmata of       recent bleeding were found in the gastric fundus consistent with fundic       gland polyps. Impression:            - Normal duodenal bulb and second portion of the                         duodenum. Biopsied.                        - Normal stomach. Biopsied.                        - Esophagogastric landmarks identified.                        - Z-line irregular, 45 cm from the incisors.                        - Normal esophagus. Recommendation:        - Await pathology results.                        - Follow an antireflux regimen.                        - Continue present medications.                        - Proceed with colonoscopy as scheduled                        See colonoscopy report Procedure Code(s):     --- Professional ---                        [phone], Esophagogastroduodenoscopy, flexible,  transoral; with biopsy, single or multiple Diagnosis Code(s):     --- Professional ---                        K22.8, Other specified diseases of esophagus                        R10.13, Epigastric pain                        D50.9, Iron deficiency anemia, unspecified CPT copyright 2019 American Medical Association. All rights reserved. The codes documented in this report are preliminary and upon coder review may  be revised to meet current compliance requirements. Dr. Ulyess Mort Lin Landsman MD, MD 11/01/2020 10:38:38 AM This report has been signed electronically. Number of Addenda: 0 Note Initiated On: 11/01/2020 10:14 AM Estimated Blood Loss:  Estimated blood loss: none.      Dr. [facility]

## 2020-11-01 NOTE — H&P (Signed)
Ernest Darby, MD [address]  [place]  [place], [name] [address]  Main: [phone]  Fax: [phone] Pager: [phone]  Primary Care Physician:  [name], MD Primary Gastroenterologist:  Dr. Cephas [name]  Pre-Procedure History & Physical: HPI:  Ernest [name] is a 75 y.o. male is here for an endoscopy and colonoscopy.   Past Medical History:  Diagnosis Date  . Chronic venous insufficiency of lower extremity   . GERD (gastroesophageal reflux disease)   . Sleep apnea     Past Surgical History:  Procedure Laterality Date  . CATARACT EXTRACTION W/PHACO Left 08/06/2020   Procedure: CATARACT EXTRACTION PHACO AND INTRAOCULAR LENS PLACEMENT ([place]) LEFT;  Surgeon: Eulogio Bear, MD;  Location: [place];  Service: Ophthalmology;  Laterality: Left;  3.41 0:31.5  . CATARACT EXTRACTION W/PHACO Right 08/27/2020   Procedure: CATARACT EXTRACTION PHACO AND INTRAOCULAR LENS PLACEMENT ([facility]) RIGHT 4.10 00:41.6;  Surgeon: Eulogio Bear, MD;  Location: [place];  Service: Ophthalmology;  Laterality: Right;  . COLONOSCOPY WITH ESOPHAGOGASTRODUODENOSCOPY (EGD)    . HERNIA REPAIR     inguinal and umbilical    Prior to Admission medications   Medication Sig Start Date End Date Taking? Authorizing Provider  metoprolol succinate (TOPROL-XL) 25 MG 24 hr tablet Take 1 tablet (25 mg total) by mouth daily. This is a decrease from your prior 50 mg daily. 10/07/20 11/06/20 Yes Enzo Bi, MD  pantoprazole (PROTONIX) 40 MG tablet TAKE 1 TABLET BY MOUTH EVERY DAY 05/24/20  Yes Masoud, Viann Shove, MD  acetaminophen (TYLENOL) 325 MG tablet Take 325 mg by mouth every 8 (eight) hours as needed. 09/01/19   [provider]  amiloride-hydrochlorothiazide (MODURETIC) 5-50 MG tablet Hold until followup with outpatient doctor due to your blood pressure being low normal. Patient not taking: Reported on 10/23/2020 10/07/20   Enzo Bi, MD  co-enzyme Q-10 30 MG capsule Take  30 mg by mouth 3 (three) times daily.    [provider]  fluticasone (FLONASE) 50 MCG/ACT nasal spray Place 1 spray into the nose daily.    [provider]  Misc Natural Products (OSTEO BI-FLEX JOINT SHIELD PO) Take by mouth.    [provider]  Multiple Vitamins-Minerals (CENTRUM SILVER ADULT 50+ PO) Take by mouth.    [provider]  Multiple Vitamins-Minerals (PRESERVISION AREDS 2) CAPS Take by mouth.    [provider]  Probiotic Product (PROBIOTIC DAILY PO) Take by mouth.    [provider]  Turmeric 500 MG CAPS Take 500 mg by mouth daily.    [provider]    Allergies as of 10/23/2020  . (No Known Allergies)    History reviewed. No pertinent family history.  Social History   Socioeconomic History  . Marital status: Married    Spouse name: Not on file  . Number of children: Not on file  . Years of education: Not on file  . Highest education level: Not on file  Occupational History  . Not on file  Tobacco Use  . Smoking status: Never Smoker  . Smokeless tobacco: Never Used  Substance and Sexual Activity  . Alcohol use: Yes    Alcohol/week: 0.0 standard drinks    Comment: rarely  . Drug use: Not Currently  . Sexual activity: Not on file  Other Topics Concern  . Not on file  Social History Narrative  . Not on file   Social Determinants of Health   Financial Resource Strain: Not  on file  Food Insecurity: Not on file  Transportation Needs: Not on file  Physical Activity: Not on file  Stress: Not on file  Social Connections: Not on file  Intimate Partner Violence: Not on file    Review of Systems: See HPI, otherwise negative ROS  Physical Exam: BP 133/75   Pulse 98   Temp (!) 96.2 F (35.7 C) (Temporal)   Resp 18   Ht 6\' 1"  (1.854 m)   Wt (!) 174.6 kg   SpO2 98%   BMI 50.79 kg/m  General:   Alert,  pleasant and cooperative in NAD Head:  Normocephalic and atraumatic. Neck:  Supple; no  masses or thyromegaly. Lungs:  Clear throughout to auscultation.    Heart:  Regular rate and rhythm. Abdomen:  Soft, nontender and nondistended. Normal bowel sounds, without guarding, and without rebound.   Neurologic:  Alert and  oriented x4;  grossly normal neurologically.  Impression/Plan: Ernest [name] is here for an endoscopy and colonoscopy to be performed for epigastric pain, Anemia  Risks, benefits, limitations, and alternatives regarding  endoscopy and colonoscopy have been reviewed with the patient.  Questions have been answered.  All parties agreeable.   Sherri Sear, MD  11/01/2020, 10:17 AM

## 2020-11-01 NOTE — Op Note (Signed)
[facility] Patient Name: Ernest [name] Procedure Date: 11/01/2020 10:13 AM MRN: [id number] Account #: [id number] Date of Birth: [birth year] Admit Type: Outpatient Age: 75 Room: [facility] ENDO ROOM 4 Gender: Male Note Status: Finalized Procedure:             Colonoscopy Indications:           This is the patient's first colonoscopy, Unexplained                         iron deficiency anemia Providers:             Lin Landsman MD, MD Referring MD:          [name], MD (Referring MD) Medicines:             General Anesthesia Complications:         No immediate complications. Estimated blood loss: None. Procedure:             Pre-Anesthesia Assessment:                        - Prior to the procedure, a History and Physical was                         performed, and patient medications and allergies were                         reviewed. The patient is competent. The risks and                         benefits of the procedure and the sedation options and                         risks were discussed with the patient. All questions                         were answered and informed consent was obtained.                         Patient identification and proposed procedure were                         verified by the physician, the nurse, the                         anesthesiologist, the anesthetist and the technician                         in the pre-procedure area in the procedure room in the                         endoscopy suite. Mental Status Examination: alert and                         oriented. Airway Examination: normal oropharyngeal                         airway and neck mobility. Respiratory Examination:  clear to auscultation. CV Examination: normal.                         Prophylactic Antibiotics: The patient does not require                         prophylactic antibiotics. Prior Anticoagulants: The                          patient has taken no previous anticoagulant or                         antiplatelet agents. ASA Grade Assessment: IV - A                         patient with severe systemic disease that is a                         constant threat to life. After reviewing the risks and                         benefits, the patient was deemed in satisfactory                         condition to undergo the procedure. The anesthesia                         plan was to use general anesthesia. Immediately prior                         to administration of medications, the patient was                         re-assessed for adequacy to receive sedatives. The                         heart rate, respiratory rate, oxygen saturations,                         blood pressure, adequacy of pulmonary ventilation, and                         response to care were monitored throughout the                         procedure. The physical status of the patient was                         re-assessed after the procedure.                        After obtaining informed consent, the colonoscope was                         passed under direct vision. Throughout the procedure,                         the patient's blood pressure, pulse, and oxygen  saturations were monitored continuously. The                         Colonoscope was introduced through the anus and                         advanced to the the cecum, identified by appendiceal                         orifice and ileocecal valve. The colonoscopy was                         performed without difficulty. The patient tolerated                         the procedure well. The quality of the bowel                         preparation was evaluated using the BBPS [facility] Bowel                         Preparation Scale) with scores of: Right Colon = 3,                         Transverse Colon = 3 and Left Colon = 3 (entire mucosa                          seen well with no residual staining, small fragments                         of stool or opaque liquid). The total BBPS score                         equals 9. Findings:      The perianal and digital rectal examinations were normal. Pertinent       negatives include normal sphincter tone and no palpable rectal lesions.      A 10-15 mm polyp was found in the appendiceal orifice. The polyp was       multi-lobulated and sessile. Polypectomy was not attempted due to       location of te polyp arising from te appendicular lumen.      A 12 mm polyp was found in the cecum. The polyp was sessile. The polyp       was removed with a hot snare. Resection and retrieval were complete.      Two sessile polyps were found in the ascending colon and cecum. The       polyps were 5 mm in size. These polyps were removed with a cold snare.       Resection and retrieval were complete.      Two sessile polyps were found in the transverse colon. The polyps were 7       to 9 mm in size. These polyps were removed with a hot snare. Resection       and retrieval were complete.      Four sessile polyps were found in the transverse colon. The polyps were       4 to 5 mm in size. These polyps were removed with a  cold snare.       Resection and retrieval were complete.      Four sessile polyps were found in the rectum. The polyps were 3 to 5 mm       in size. These polyps were removed with a cold snare. Resection and       retrieval were complete.      The retroflexed view of the distal rectum and anal verge was normal and       showed no anal or rectal abnormalities. Impression:            - One 10 mm polyp at the appendiceal orifice.                         Resection not attempted.                        - One 12 mm polyp in the cecum, removed with a hot                         snare. Resected and retrieved.                        - Two 5 mm polyps in the ascending colon and in the                          cecum, removed with a cold snare. Resected and                         retrieved.                        - Two 7 to 9 mm polyps in the transverse colon,                         removed with a hot snare. Resected and retrieved.                        - Four 4 to 5 mm polyps in the transverse colon,                         removed with a cold snare. Resected and retrieved.                        - Four 3 to 5 mm polyps in the rectum, removed with a                         cold snare. Resected and retrieved.                        - The distal rectum and anal verge are normal on                         retroflexion view. Recommendation:        - Discharge patient to home (with escort).                        - Resume previous diet today.                        -  Continue present medications.                        - Await pathology results.                        - Refer to a colo-rectal surgeon at appointment to be                         scheduled for surgical removal of unresectable polyp                         arising from the appendical orifice/lumen.                        - Repeat colonoscopy in 3 years for surveillance of                         multiple polyps. Procedure Code(s):     --- Professional ---                        [phone], Colonoscopy, flexible; with removal of                         tumor(s), polyp(s), or other lesion(s) by snare                         technique Diagnosis Code(s):     --- Professional ---                        K62.1, Rectal polyp                        K63.5, Polyp of colon                        D50.9, Iron deficiency anemia, unspecified CPT copyright 2019 American Medical Association. All rights reserved. The codes documented in this report are preliminary and upon coder review may  be revised to meet current compliance requirements. Dr. Ulyess Mort Lin Landsman MD, MD 11/01/2020 11:22:45 AM This report has been signed  electronically. Number of Addenda: 0 Note Initiated On: 11/01/2020 10:13 AM Scope Withdrawal Time: 0 hours 24 minutes 16 seconds  Total Procedure Duration: 0 hours 29 minutes 39 seconds  Estimated Blood Loss:  Estimated blood loss: none.      [facility]

## 2020-11-01 NOTE — Transfer of Care (Signed)
Immediate Anesthesia Transfer of Care Note  Patient: Ernest [name]  Procedure(s) Performed: COLONOSCOPY WITH PROPOFOL (N/A ) ESOPHAGOGASTRODUODENOSCOPY (EGD) WITH PROPOFOL (N/A )  Patient Location: PACU  Anesthesia Type:General  Level of Consciousness: awake and sedated  Airway & Oxygen Therapy: Patient Spontanous Breathing and Patient connected to face mask oxygen  Post-op Assessment: Report given to RN and Post -op Vital signs reviewed and stable  Post vital signs: Reviewed and stable  Last Vitals:  Vitals Value Taken Time  BP    Temp    Pulse    Resp    SpO2      Last Pain:  Vitals:   11/01/20 0900  TempSrc: Temporal  PainSc:          Complications: No complications documented.

## 2020-11-01 NOTE — Anesthesia Procedure Notes (Addendum)
Date/Time: 11/01/2020 10:33 AM Performed by: Vaughan Sine Pre-anesthesia Checklist: Patient identified, Emergency Drugs available, Suction available, Patient being monitored and Timeout performed Oxygen Delivery Method: Simple face mask and Supernova nasal CPAP Ventilation: Oral airway inserted - appropriate to patient size

## 2020-11-02 ENCOUNTER — Encounter: Payer: Self-pay | Admitting: Gastroenterology

## 2020-11-02 LAB — SURGICAL PATHOLOGY

## 2020-11-03 NOTE — Anesthesia Postprocedure Evaluation (Signed)
Anesthesia Post Note  Patient: Ernest [name]  Procedure(s) Performed: COLONOSCOPY WITH PROPOFOL (N/A ) ESOPHAGOGASTRODUODENOSCOPY (EGD) WITH PROPOFOL (N/A )  Patient location during evaluation: PACU Anesthesia Type: General Level of consciousness: awake and alert Pain management: pain level controlled Vital Signs Assessment: post-procedure vital signs reviewed and stable Respiratory status: spontaneous breathing, nonlabored ventilation, respiratory function stable and patient connected to nasal cannula oxygen Cardiovascular status: blood pressure returned to baseline and stable Postop Assessment: no apparent nausea or vomiting Anesthetic complications: no   No complications documented.   Last Vitals:  Vitals:   11/01/20 1130 11/01/20 1140  BP: 110/88 97/61  Pulse: 68 70  Resp: (!) 23 16  Temp:    SpO2: 100% 96%    Last Pain:  Vitals:   11/01/20 1140  TempSrc:   PainSc: 0-No pain                 Molli Barrows

## 2020-11-05 ENCOUNTER — Telehealth: Payer: Self-pay

## 2020-11-05 NOTE — Telephone Encounter (Signed)
Placed referral and fax it

## 2020-11-05 NOTE — Telephone Encounter (Signed)
-----   Message from Lin Landsman, MD sent at 11/05/2020 10:50 AM EDT ----- Refer to colorectal surgeon, [place] [facility] Dx: unresectable appendicular orifice polyp  Thanks RV

## 2020-11-06 ENCOUNTER — Encounter: Payer: Self-pay | Admitting: Internal Medicine

## 2020-11-06 ENCOUNTER — Encounter: Payer: Self-pay | Admitting: Gastroenterology

## 2020-11-06 ENCOUNTER — Other Ambulatory Visit: Payer: Self-pay

## 2020-11-06 ENCOUNTER — Ambulatory Visit (INDEPENDENT_AMBULATORY_CARE_PROVIDER_SITE_OTHER): Payer: Medicare Other | Admitting: Internal Medicine

## 2020-11-06 VITALS — BP 130/84 | HR 91 | Ht 73.0 in | Wt 392.5 lb

## 2020-11-06 DIAGNOSIS — K219 Gastro-esophageal reflux disease without esophagitis: Secondary | ICD-10-CM

## 2020-11-06 DIAGNOSIS — I471 Supraventricular tachycardia: Secondary | ICD-10-CM

## 2020-11-06 DIAGNOSIS — J9601 Acute respiratory failure with hypoxia: Secondary | ICD-10-CM | POA: Diagnosis not present

## 2020-11-06 DIAGNOSIS — R599 Enlarged lymph nodes, unspecified: Secondary | ICD-10-CM | POA: Diagnosis not present

## 2020-11-06 MED ORDER — AMOXICILLIN 875 MG PO TABS: 875.0000 mg | ORAL_TABLET | Freq: Two times a day (BID) | ORAL | 0 refills | Status: AC

## 2020-11-06 NOTE — Assessment & Plan Note (Signed)
Patient was advised to take Mylanta as needed.

## 2020-11-06 NOTE — Assessment & Plan Note (Signed)
Stable at the present time.  Patient is taking metoprolol 25 mg daily

## 2020-11-06 NOTE — Assessment & Plan Note (Signed)

## 2020-11-06 NOTE — Progress Notes (Signed)
Established Patient Office Visit  Subjective:  Patient ID: Ernest [name], male    DOB: 1946-02-25  Age: 75 y.o. MRN: [id number]  CC:  Chief Complaint  Patient presents with  . Abdominal Pain    Patient complains of bloating abdominal pain x 1 month.    HPI  Ernest [name] presents forcheck up  Past Medical History:  Diagnosis Date  . Chronic venous insufficiency of lower extremity   . GERD (gastroesophageal reflux disease)   . Sleep apnea     Past Surgical History:  Procedure Laterality Date  . CATARACT EXTRACTION W/PHACO Left 08/06/2020   Procedure: CATARACT EXTRACTION PHACO AND INTRAOCULAR LENS PLACEMENT ([place]) LEFT;  Surgeon: Eulogio Bear, MD;  Location: [place];  Service: Ophthalmology;  Laterality: Left;  3.41 0:31.5  . CATARACT EXTRACTION W/PHACO Right 08/27/2020   Procedure: CATARACT EXTRACTION PHACO AND INTRAOCULAR LENS PLACEMENT ([facility]) RIGHT 4.10 00:41.6;  Surgeon: Eulogio Bear, MD;  Location: [place];  Service: Ophthalmology;  Laterality: Right;  . COLONOSCOPY WITH ESOPHAGOGASTRODUODENOSCOPY (EGD)    . COLONOSCOPY WITH PROPOFOL N/A 11/01/2020   Procedure: COLONOSCOPY WITH PROPOFOL;  Surgeon: Lin Landsman, MD;  Location: [facility] ENDOSCOPY;  Service: Gastroenterology;  Laterality: N/A;  . ESOPHAGOGASTRODUODENOSCOPY (EGD) WITH PROPOFOL N/A 11/01/2020   Procedure: ESOPHAGOGASTRODUODENOSCOPY (EGD) WITH PROPOFOL;  Surgeon: Lin Landsman, MD;  Location: [facility] ENDOSCOPY;  Service: Gastroenterology;  Laterality: N/A;  . HERNIA REPAIR     inguinal and umbilical    History reviewed. No pertinent family history.  Social History   Socioeconomic History  . Marital status: Married    Spouse name: Not on file  . Number of children: Not on file  . Years of education: Not on file  . Highest education level: Not on file  Occupational History  . Not on file  Tobacco Use  . Smoking status: Never Smoker  . Smokeless tobacco:  Never Used  Substance and Sexual Activity  . Alcohol use: Yes    Alcohol/week: 0.0 standard drinks    Comment: rarely  . Drug use: Not Currently  . Sexual activity: Not on file  Other Topics Concern  . Not on file  Social History Narrative  . Not on file   Social Determinants of Health   Financial Resource Strain: Not on file  Food Insecurity: Not on file  Transportation Needs: Not on file  Physical Activity: Not on file  Stress: Not on file  Social Connections: Not on file  Intimate Partner Violence: Not on file     Current Outpatient Medications:  .  acetaminophen (TYLENOL) 325 MG tablet, Take 325 mg by mouth every 8 (eight) hours as needed., Disp: , Rfl:  .  amiloride-hydrochlorothiazide (MODURETIC) 5-50 MG tablet, Hold until followup with outpatient doctor due to your blood pressure being low normal., Disp: , Rfl: 6 .  amoxicillin (AMOXIL) 875 MG tablet, Take 1 tablet (875 mg total) by mouth 2 (two) times daily for 10 days., Disp: 20 tablet, Rfl: 0 .  co-enzyme Q-10 30 MG capsule, Take 30 mg by mouth 3 (three) times daily., Disp: , Rfl:  .  Ferrous Sulfate (IRON PO), Take 1 capsule by mouth daily., Disp: , Rfl:  .  fluticasone (FLONASE) 50 MCG/ACT nasal spray, Place 1 spray into the nose daily., Disp: , Rfl:  .  metoprolol succinate (TOPROL-XL) 25 MG 24 hr tablet, Take 1 tablet (25 mg total) by mouth daily. This is a decrease from your prior  50 mg daily., Disp: 30 tablet, Rfl: 0 .  Misc Natural Products (OSTEO BI-FLEX JOINT SHIELD PO), Take by mouth., Disp: , Rfl:  .  Multiple Vitamins-Minerals (CENTRUM SILVER ADULT 50+ PO), Take by mouth., Disp: , Rfl:  .  Multiple Vitamins-Minerals (PRESERVISION AREDS 2) CAPS, Take by mouth., Disp: , Rfl:  .  pantoprazole (PROTONIX) 40 MG tablet, TAKE 1 TABLET BY MOUTH EVERY DAY, Disp: 90 tablet, Rfl: 3 .  Probiotic Product (PROBIOTIC DAILY PO), Take by mouth., Disp: , Rfl:  .  Turmeric 500 MG CAPS, Take 500 mg by mouth daily., Disp: , Rfl:     No Known Allergies  ROS Review of Systems    Objective:    Physical Exam  BP 130/84   Pulse 91   Ht 6\' 1"  (1.854 m)   Wt (!) 392 lb 8 oz (178 kg)   BMI 51.78 kg/m  Wt Readings from Last 3 Encounters:  11/06/20 (!) 392 lb 8 oz (178 kg)  11/01/20 (!) 385 lb (174.6 kg)  10/23/20 (!) 397 lb 8 oz (180.3 kg)     Health Maintenance Due  Topic Date Due  . Hepatitis C Screening  Never done  . TETANUS/TDAP  Never done    There are no preventive care reminders to display for this patient.  No results found for: TSH Lab Results  Component Value Date   WBC 9.2 10/07/2020   HGB 12.1 (L) 10/07/2020   HCT 36.5 (L) 10/07/2020   MCV 81.1 10/07/2020   PLT 169 10/07/2020   Lab Results  Component Value Date   NA 138 10/07/2020   K 3.5 10/07/2020   CO2 28 10/07/2020   GLUCOSE 103 (H) 10/07/2020   BUN 12 10/07/2020   CREATININE 0.85 10/07/2020   BILITOT 0.7 10/06/2020   ALKPHOS 34 (L) 10/06/2020   AST 24 10/06/2020   ALT 13 10/06/2020   PROT 5.7 (L) 10/06/2020   ALBUMIN 2.9 (L) 10/06/2020   CALCIUM 7.9 (L) 10/07/2020   ANIONGAP 9 10/07/2020   No results found for: CHOL No results found for: HDL No results found for: LDLCALC No results found for: TRIG No results found for: CHOLHDL No results found for: HGBA1C    Assessment & Plan:   Problem List Items Addressed This Visit      Cardiovascular and Mediastinum   SVT (supraventricular tachycardia) ([place]) - Primary    Stable at the present time.  Patient is taking metoprolol 25 mg daily        Digestive   GERD (gastroesophageal reflux disease)    Patient was advised to take Mylanta as needed.        Immune and Lymphatic   Swollen lymph nodes    Patient still has swelling of right submandibular gland.  Refer to ENT.      Relevant Medications   amoxicillin (AMOXIL) 875 MG tablet     Other   Morbid obesity due to excess calories ([place])    - I encouraged the patient to lose weight.  - I educated them on  making healthy dietary choices including eating more fruits and vegetables and less fried foods. - I encouraged the patient to exercise more, and educated on the benefits of exercise including weight loss, diabetes prevention, and hypertension prevention.   Dietary counseling with a registered dietician  Referral to a weight management support group (e.g. Weight Watchers, Overeaters Anonymous)  If your BMI is greater than 29 or you have gained more than 15 pounds  you should work on weight loss.  Attend a healthy cooking class          Meds ordered this encounter  Medications  . amoxicillin (AMOXIL) 875 MG tablet    Sig: Take 1 tablet (875 mg total) by mouth 2 (two) times daily for 10 days.    Dispense:  20 tablet    Refill:  0    Follow-up: No follow-ups on file.    [name], MD

## 2020-11-06 NOTE — Assessment & Plan Note (Signed)
Patient still has swelling of right submandibular gland.  Refer to ENT.

## 2020-11-08 ENCOUNTER — Other Ambulatory Visit: Payer: Self-pay | Admitting: Gastroenterology

## 2020-11-08 ENCOUNTER — Encounter: Payer: Self-pay | Admitting: Gastroenterology

## 2020-11-08 DIAGNOSIS — K580 Irritable bowel syndrome with diarrhea: Secondary | ICD-10-CM

## 2020-11-08 DIAGNOSIS — Z8719 Personal history of other diseases of the digestive system: Secondary | ICD-10-CM | POA: Diagnosis not present

## 2020-11-08 DIAGNOSIS — R140 Abdominal distension (gaseous): Secondary | ICD-10-CM

## 2020-11-08 DIAGNOSIS — Z9889 Other specified postprocedural states: Secondary | ICD-10-CM | POA: Diagnosis not present

## 2020-11-08 MED ORDER — METRONIDAZOLE 500 MG PO TABS: 500.0000 mg | ORAL_TABLET | Freq: Two times a day (BID) | ORAL | 0 refills | Status: AC

## 2020-11-08 MED ORDER — RIFAXIMIN 550 MG PO TABS: 550.0000 mg | ORAL_TABLET | Freq: Three times a day (TID) | ORAL | 0 refills | Status: AC

## 2020-11-12 ENCOUNTER — Encounter: Payer: Self-pay | Admitting: Gastroenterology

## 2020-11-13 ENCOUNTER — Other Ambulatory Visit: Payer: Self-pay | Admitting: Gastroenterology

## 2020-11-13 DIAGNOSIS — G8929 Other chronic pain: Secondary | ICD-10-CM

## 2020-11-13 MED ORDER — ONDANSETRON HCL 4 MG PO TABS: 4.0000 mg | ORAL_TABLET | Freq: Three times a day (TID) | ORAL | 0 refills | Status: DC | PRN

## 2020-11-20 ENCOUNTER — Ambulatory Visit: Payer: Medicare Other | Admitting: Internal Medicine

## 2020-11-22 ENCOUNTER — Other Ambulatory Visit: Payer: Self-pay | Admitting: *Deleted

## 2020-11-22 MED ORDER — AMILORIDE-HYDROCHLOROTHIAZIDE 5-50 MG PO TABS: ORAL_TABLET | ORAL | 6 refills | Status: DC

## 2020-11-29 ENCOUNTER — Other Ambulatory Visit: Payer: Self-pay | Admitting: *Deleted

## 2020-11-29 MED ORDER — METOPROLOL SUCCINATE ER 25 MG PO TB24: 25.0000 mg | ORAL_TABLET | Freq: Every day | ORAL | 6 refills | Status: DC

## 2020-11-30 ENCOUNTER — Encounter: Payer: Self-pay | Admitting: Family Medicine

## 2020-11-30 ENCOUNTER — Ambulatory Visit (INDEPENDENT_AMBULATORY_CARE_PROVIDER_SITE_OTHER): Payer: Medicare Other | Admitting: Family Medicine

## 2020-11-30 ENCOUNTER — Other Ambulatory Visit: Payer: Self-pay

## 2020-11-30 VITALS — BP 148/92 | HR 61 | Ht 73.0 in | Wt 373.1 lb

## 2020-11-30 DIAGNOSIS — R140 Abdominal distension (gaseous): Secondary | ICD-10-CM

## 2020-11-30 NOTE — Progress Notes (Signed)
Established Patient Office Visit  SUBJECTIVE:  Subjective  Patient ID: Ernest [name], male    DOB: 1946/04/02  Age: 75 y.o. MRN: [id number]  CC:  Chief Complaint  Patient presents with   Bloated    HPI Ernest [name] is a 75 y.o. male presenting today for     Past Medical History:  Diagnosis Date   Chronic venous insufficiency of lower extremity    GERD (gastroesophageal reflux disease)    Sleep apnea     Past Surgical History:  Procedure Laterality Date   CATARACT EXTRACTION W/PHACO Left 08/06/2020   Procedure: CATARACT EXTRACTION PHACO AND INTRAOCULAR LENS PLACEMENT ([place]) LEFT;  Surgeon: Eulogio Bear, MD;  Location: [place];  Service: Ophthalmology;  Laterality: Left;  3.41 0:31.5   CATARACT EXTRACTION W/PHACO Right 08/27/2020   Procedure: CATARACT EXTRACTION PHACO AND INTRAOCULAR LENS PLACEMENT ([facility]) RIGHT 4.10 00:41.6;  Surgeon: Eulogio Bear, MD;  Location: [place];  Service: Ophthalmology;  Laterality: Right;   COLONOSCOPY WITH ESOPHAGOGASTRODUODENOSCOPY (EGD)     COLONOSCOPY WITH PROPOFOL N/A 11/01/2020   Procedure: COLONOSCOPY WITH PROPOFOL;  Surgeon: Lin Landsman, MD;  Location[facility] ENDOSCOPY;  Service: Gastroenterology;  Laterality: N/A;   ESOPHAGOGASTRODUODENOSCOPY (EGD) WITH PROPOFOL N/A 11/01/2020   Procedure: ESOPHAGOGASTRODUODENOSCOPY (EGD) WITH PROPOFOL;  Surgeon: Lin Landsman, MD;  Location: [facility] ENDOSCOPY;  Service: Gastroenterology;  Laterality: N/A;   HERNIA REPAIR     inguinal and umbilical    No family history on file.  Social History   Socioeconomic History   Marital status: Married    Spouse name: Not on file   Number of children: Not on file   Years of education: Not on file   Highest education level: Not on file  Occupational History   Not on file  Tobacco Use   Smoking status: Never   Smokeless tobacco: Never  Substance and Sexual Activity   Alcohol use: Yes    Alcohol/week: 0.0  standard drinks    Comment: rarely   Drug use: Not Currently   Sexual activity: Not on file  Other Topics Concern   Not on file  Social History Narrative   Not on file   Social Determinants of Health   Financial Resource Strain: Not on file  Food Insecurity: Not on file  Transportation Needs: Not on file  Physical Activity: Not on file  Stress: Not on file  Social Connections: Not on file  Intimate Partner Violence: Not on file     Current Outpatient Medications:    acetaminophen (TYLENOL) 325 MG tablet, Take 325 mg by mouth every 8 (eight) hours as needed., Disp: , Rfl:    amiloride-hydrochlorothiazide (MODURETIC) 5-50 MG tablet, Hold until followup with outpatient doctor due to your blood pressure being low normal., Disp: 30 tablet, Rfl: 6   co-enzyme Q-10 30 MG capsule, Take 30 mg by mouth 3 (three) times daily., Disp: , Rfl:    Ferrous Sulfate (IRON PO), Take 1 capsule by mouth daily., Disp: , Rfl:    fluticasone (FLONASE) 50 MCG/ACT nasal spray, Place 1 spray into the nose daily., Disp: , Rfl:    metoprolol succinate (TOPROL-XL) 25 MG 24 hr tablet, Take 1 tablet (25 mg total) by mouth daily. This is a decrease from your prior 50 mg daily., Disp: 30 tablet, Rfl: 6   Misc Natural Products (OSTEO BI-FLEX JOINT SHIELD PO), Take by mouth., Disp: , Rfl:    Multiple Vitamins-Minerals (CENTRUM SILVER ADULT 50+  PO), Take by mouth., Disp: , Rfl:    Multiple Vitamins-Minerals (PRESERVISION AREDS 2) CAPS, Take by mouth., Disp: , Rfl:    ondansetron (ZOFRAN) 4 MG tablet, Take 1 tablet (4 mg total) by mouth every 8 (eight) hours as needed for nausea or vomiting., Disp: 14 tablet, Rfl: 0   pantoprazole (PROTONIX) 40 MG tablet, TAKE 1 TABLET BY MOUTH EVERY DAY, Disp: 90 tablet, Rfl: 3   Probiotic Product (PROBIOTIC DAILY PO), Take by mouth., Disp: , Rfl:    Turmeric 500 MG CAPS, Take 500 mg by mouth daily., Disp: , Rfl:    No Known Allergies  ROS Review of Systems  Constitutional:  Negative.   HENT: Negative.    Respiratory: Negative.    Gastrointestinal:  Positive for abdominal distention and abdominal pain.  Neurological: Negative.   Psychiatric/Behavioral: Negative.      OBJECTIVE:    Physical Exam Constitutional:      Appearance: Normal appearance. He is obese.  HENT:     Head: Normocephalic.  Cardiovascular:     Rate and Rhythm: Normal rate and regular rhythm.  Pulmonary:     Effort: Pulmonary effort is normal.  Musculoskeletal:        General: Normal range of motion.  Skin:    General: Skin is warm.    BP (!) 148/92   Pulse 61   Ht 6\' 1"  (1.854 m)   Wt (!) 373 lb 1.6 oz (169.2 kg)   BMI 49.22 kg/m  Wt Readings from Last 3 Encounters:  11/30/20 (!) 373 lb 1.6 oz (169.2 kg)  11/06/20 (!) 392 lb 8 oz (178 kg)  11/01/20 (!) 385 lb (174.6 kg)    Health Maintenance Due  Topic Date Due   Hepatitis C Screening  Never done   TETANUS/TDAP  Never done   Zoster Vaccines- Shingrix (1 of 2) Never done   COVID-19 Vaccine (4 - Booster for Pfizer series) 08/12/2020    There are no preventive care reminders to display for this patient.  CBC Latest Ref Rng & Units 10/07/2020 10/06/2020 10/05/2020  WBC 4.0 - 10.5 K/uL 9.2 10.9(H) 18.2(H)  Hemoglobin 13.0 - 17.0 g/dL 12.1(L) 11.6(L) 13.1  Hematocrit 39.0 - 52.0 % 36.5(L) 35.9(L) 40.0  Platelets 150 - 400 K/uL 169 168 202   CMP Latest Ref Rng & Units 10/07/2020 10/06/2020 10/05/2020  Glucose 70 - 99 mg/dL 103(H) 96 -  BUN 8 - 23 mg/dL 12 15 -  Creatinine 0.61 - 1.24 mg/dL 0.85 0.84 1.02  Sodium 135 - 145 mmol/L 138 138 -  Potassium 3.5 - 5.1 mmol/L 3.5 3.6 -  Chloride 98 - 111 mmol/L 101 104 -  CO2 22 - 32 mmol/L 28 27 -  Calcium 8.9 - 10.3 mg/dL 7.9(L) 8.0(L) -  Total Protein 6.5 - 8.1 g/dL - 5.7(L) -  Total Bilirubin 0.3 - 1.2 mg/dL - 0.7 -  Alkaline Phos 38 - 126 U/L - 34(L) -  AST 15 - 41 U/L - 24 -  ALT 0 - 44 U/L - 13 -    No results found for: TSH Lab Results  Component Value Date    ALBUMIN 2.9 (L) 10/06/2020   ANIONGAP 9 10/07/2020   No results found for: CHOL, HDL, LDLCALC, CHOLHDL No results found for: TRIG No results found for: HGBA1C    ASSESSMENT & PLAN:   Problem List Items Addressed This Visit       Other   Abdominal bloating - Primary    No  orders of the defined types were placed in this encounter.     Follow-up: No follow-ups on file.    Beckie Salts, [place] [address] [place], [place] [address]

## 2020-11-30 NOTE — Assessment & Plan Note (Signed)
Patient has had abdominal bloating for 6 months. He has had a recent endoscopy and Colonoscopy that did not reveal a cause. He is having normal pattern of bowel movements but the color is yellow. He recently completed a 14 day course of Flagyl to cover GI bacterial overgrowth. Patient is asking for Ref to [place] for Hydrogen breath test to Rule Out SIBO.   Plan- Ref to [facility] for above evaluation.

## 2020-12-03 ENCOUNTER — Encounter: Payer: Self-pay | Admitting: Family Medicine

## 2020-12-03 ENCOUNTER — Telehealth: Payer: Self-pay

## 2020-12-03 NOTE — Telephone Encounter (Signed)
Placed referral to [facility]  

## 2020-12-03 NOTE — Telephone Encounter (Signed)
-----   Message from Lin Landsman, MD sent at 12/03/2020  3:43 PM EDT ----- Regarding: Referral Caryl Pina  Please refer him to [facility] for hydrogen breath test Dx: Abdominal bloating and loose stools  RV

## 2020-12-04 ENCOUNTER — Encounter: Payer: Self-pay | Admitting: Gastroenterology

## 2020-12-04 DIAGNOSIS — K635 Polyp of colon: Secondary | ICD-10-CM | POA: Diagnosis not present

## 2020-12-04 DIAGNOSIS — Z9889 Other specified postprocedural states: Secondary | ICD-10-CM | POA: Diagnosis not present

## 2020-12-04 DIAGNOSIS — Z8719 Personal history of other diseases of the digestive system: Secondary | ICD-10-CM | POA: Diagnosis not present

## 2020-12-04 DIAGNOSIS — D649 Anemia, unspecified: Secondary | ICD-10-CM | POA: Diagnosis not present

## 2020-12-05 ENCOUNTER — Encounter: Payer: Self-pay | Admitting: Gastroenterology

## 2020-12-05 DIAGNOSIS — R140 Abdominal distension (gaseous): Secondary | ICD-10-CM | POA: Diagnosis not present

## 2020-12-05 LAB — IRON,TIBC AND FERRITIN PANEL
Ferritin: 75 ng/mL (ref 30–400)
Iron Saturation: 22 % (ref 15–55)
Iron: 65 ug/dL (ref 38–169)
Total Iron Binding Capacity: 291 ug/dL (ref 250–450)
UIBC: 226 ug/dL (ref 111–343)

## 2020-12-05 LAB — CBC
Hematocrit: 46 % (ref 37.5–51.0)
Hemoglobin: 15.1 g/dL (ref 13.0–17.7)
MCH: 26.3 pg — ABNORMAL LOW (ref 26.6–33.0)
MCHC: 32.8 g/dL (ref 31.5–35.7)
MCV: 80 fL (ref 79–97)
Platelets: 302 10*3/uL (ref 150–450)
RBC: 5.75 x10E6/uL (ref 4.14–5.80)
RDW: 14.3 % (ref 11.6–15.4)
WBC: 9.9 10*3/uL (ref 3.4–10.8)

## 2020-12-05 LAB — B12 AND FOLATE PANEL
Folate: >20 ng/mL (ref >3.0)
Vitamin B-12: 384 pg/mL (ref 232–1245)

## 2020-12-07 DIAGNOSIS — J9601 Acute respiratory failure with hypoxia: Secondary | ICD-10-CM | POA: Diagnosis not present

## 2020-12-10 LAB — PANCREATIC ELASTASE, FECAL: Pancreatic Elastase, Fecal: 92 ug Elast./g — ABNORMAL LOW (ref >200)

## 2020-12-11 ENCOUNTER — Other Ambulatory Visit: Payer: Self-pay

## 2020-12-11 MED ORDER — PANCRELIPASE (LIP-PROT-AMYL) 36000-114000 UNITS PO CPEP: ORAL_CAPSULE | ORAL | 1 refills | Status: DC

## 2020-12-13 ENCOUNTER — Encounter: Payer: Self-pay | Admitting: Pulmonary Disease

## 2020-12-13 ENCOUNTER — Other Ambulatory Visit: Payer: Self-pay

## 2020-12-13 ENCOUNTER — Ambulatory Visit (INDEPENDENT_AMBULATORY_CARE_PROVIDER_SITE_OTHER): Payer: Medicare Other | Admitting: Pulmonary Disease

## 2020-12-13 VITALS — BP 90/60 | HR 99 | Temp 98.9°F | Ht 73.0 in | Wt 391.2 lb

## 2020-12-13 DIAGNOSIS — E662 Morbid (severe) obesity with alveolar hypoventilation: Secondary | ICD-10-CM

## 2020-12-13 DIAGNOSIS — R0600 Dyspnea, unspecified: Secondary | ICD-10-CM | POA: Diagnosis not present

## 2020-12-13 DIAGNOSIS — G4733 Obstructive sleep apnea (adult) (pediatric): Secondary | ICD-10-CM

## 2020-12-13 NOTE — Progress Notes (Signed)
Subjective:    Patient ID: Ernest [name], male    DOB: 1946-04-25, 75 y.o.   MRN: [id number]  Chief Complaint  Patient presents with   Consult    Consult for hypoxia, sob,    HPI The patient is a 75 year old lifelong never smoker who presents for evaluation of shortness of breath and previously noted hypoxia.  He is referred kindly by Dr. [name].  As were noted [facility] on 05 October 2020 where he was noted to be encephalopathic and noted to have oxygen desaturations with ambulation.  The patient states that since that admission symptoms have improved.  He apparently had pneumonia ruled out and may have been just a viral infection.  The patient has a longstanding history of sleep apnea for 5 to 6 years.  He used to be on BiPAP but discontinued using this.  He recently tried his home unit after using BiPAP while hospitalized.  Currently arterial blood gases were not obtained during his hospitalization.  Sleep study was so remote that we cannot find records on it and the sleep center where he had it has since closed.  He states that he knows that his shortness of breath is due to his extreme obesity.  He has a BMI of 51.  He is aware of these issues.  He has occasional productive cough of clear sputum particularly in the mornings.  This has not changed in character.  No hemoptysis.  No chest pain.  The patient requests new BiPAP equipment as his BiPAP unit is over 53 years old.  Patient's occupational history was reviewed.  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  He lives with his wife, he is currently retired.   Review of Systems A 10 point review of systems was performed and it is as noted above otherwise negative.  Past Medical History:  Diagnosis Date   Chronic venous insufficiency of lower extremity    GERD (gastroesophageal reflux disease)    Sleep  apnea    Past Surgical History:  Procedure Laterality Date   CATARACT EXTRACTION W/PHACO Left 08/06/2020   Procedure: CATARACT EXTRACTION PHACO AND INTRAOCULAR LENS PLACEMENT ([place]) LEFT;  Surgeon: Eulogio Bear, MD;  Location: [place];  Service: Ophthalmology;  Laterality: Left;  3.41 0:31.5   CATARACT EXTRACTION W/PHACO Right 08/27/2020   Procedure: CATARACT EXTRACTION PHACO AND INTRAOCULAR LENS PLACEMENT ([facility]) RIGHT 4.10 00:41.6;  Surgeon: Eulogio Bear, MD;  Location: [place];  Service: Ophthalmology;  Laterality: Right;   COLONOSCOPY WITH ESOPHAGOGASTRODUODENOSCOPY (EGD)     COLONOSCOPY WITH PROPOFOL N/A 11/01/2020   Procedure: COLONOSCOPY WITH PROPOFOL;  Surgeon: Lin Landsman, MD;  Location: [facility] ENDOSCOPY;  Service: Gastroenterology;  Laterality: N/A;   ESOPHAGOGASTRODUODENOSCOPY (EGD) WITH PROPOFOL N/A 11/01/2020   Procedure: ESOPHAGOGASTRODUODENOSCOPY (EGD) WITH PROPOFOL;  Surgeon: Lin Landsman, MD;  Location: [facility] ENDOSCOPY;  Service: Gastroenterology;  Laterality: N/A;   HERNIA REPAIR     inguinal and umbilical   History reviewed. No pertinent family history.  Social History   Tobacco Use   Smoking status: Never   Smokeless tobacco: Never  Substance Use Topics   Alcohol use: Yes    Alcohol/week: 0.0 standard drinks    Comment: rarely   No Known Allergies   Current Meds  Medication Sig   acetaminophen (TYLENOL) 325 MG tablet Take 325  mg by mouth every 8 (eight) hours as needed.   amiloride-hydrochlorothiazide (MODURETIC) 5-50 MG tablet Hold until followup with outpatient doctor due to your blood pressure being low normal.   co-enzyme Q-10 30 MG capsule Take 30 mg by mouth 3 (three) times daily.   Ferrous Sulfate (IRON PO) Take 1 capsule by mouth daily.   fluticasone (FLONASE) 50 MCG/ACT nasal spray Place 1 spray into the nose daily.   lipase/protease/amylase (CREON) 36000 UNITS CPEP capsule Take 3 capsules with the first bite  of each meal and 2 capsules with the first bite of each snack   metoprolol succinate (TOPROL-XL) 25 MG 24 hr tablet Take 1 tablet (25 mg total) by mouth daily. This is a decrease from your prior 50 mg daily.   Misc Natural Products (OSTEO BI-FLEX JOINT SHIELD PO) Take by mouth.   Multiple Vitamins-Minerals (CENTRUM SILVER ADULT 50+ PO) Take by mouth.   Multiple Vitamins-Minerals (PRESERVISION AREDS 2) CAPS Take by mouth.   nystatin (MYCOSTATIN/NYSTOP) powder Apply 1 application topically 3 (three) times daily.   ondansetron (ZOFRAN) 4 MG tablet Take 1 tablet (4 mg total) by mouth every 8 (eight) hours as needed for nausea or vomiting.   pantoprazole (PROTONIX) 40 MG tablet TAKE 1 TABLET BY MOUTH EVERY DAY   predniSONE (STERAPRED UNI-PAK 21 TAB) 10 MG (21) TBPK tablet Take by mouth.   Probiotic Product (PROBIOTIC DAILY PO) Take by mouth.   Turmeric 500 MG CAPS Take 500 mg by mouth daily.   Immunization History  Administered Date(s) Administered   Influenza, High Dose Seasonal PF 09/01/2019   Influenza-Unspecified 02/21/2018, 04/11/2020   PFIZER(Purple Top)SARS-COV-2 Vaccination 08/19/2019, 09/13/2019, 04/11/2020   Pneumococcal-Unspecified 04/16/2020      Objective:   Physical Exam BP 90/60 (BP Location: Left Arm, Patient Position: Sitting, Cuff Size: Normal)   Pulse 99   Temp 98.9 F (37.2 C) (Temporal)   Ht 6\' 1"  (1.854 m)   Wt (!) 391 lb 3.2 oz (177.4 kg)   SpO2 95%   BMI 51.61 kg/m  GENERAL: Moderately obese male.  Presents in transport chair.  No respiratory distress.  No conversational dyspnea. HEAD: Normocephalic, atraumatic.  EYES: Pupils equal, round, reactive to light.  No scleral icterus.  MOUTH: Nose/mouth/throat not examined due to masking requirements for COVID 19. NECK: Supple. No thyromegaly. Trachea midline. No JVD.  No adenopathy. PULMONARY: Good air entry bilaterally.  No adventitious sounds. CARDIOVASCULAR: S1 and S2. Regular rate and rhythm.  No rubs, murmurs  or gallops heard. ABDOMEN: Obese, otherwise benign, cannot discern organomegaly due to size. MUSCULOSKELETAL: No joint deformity, no clubbing, chronic lower extremity edema/lymphedema, TED hose present.   NEUROLOGIC: No focal deficit, no gait disturbance, speech is fluent. SKIN: Intact,warm,dry. PSYCH: Mood and behavior normal      Assessment & Plan:     ICD-10-CM   1. Dyspnea, unspecified type  R06.00 ECHOCARDIOGRAM COMPLETE    Split night study    Pulmonary Function Test [facility] Only   Suspect due to extreme obesity with obesity hypoventilation Obtain PFTs 2D echo to evaluate for potential PAH    2. Obstructive sleep apnea  G47.33 Split night study   Prior use of BiPAP, needs new equipment Will need reassessment Split-night study    3. Extreme obesity with alveolar hypoventilation (HCC)  E66.2    This issue adds complexity to his management Obtain ABG to confirm      Orders Placed This Encounter  Procedures   Pulmonary Function Test [facility] Only  Standing Status:   Future    Standing Expiration Date:   12/13/2021    Scheduling Instructions:     Next available    Order Specific Question:   Full PFT: includes the following: basic spirometry, spirometry pre & post bronchodilator, diffusion capacity (DLCO), lung volumes    Answer:   Full PFT    Order Specific Question:   ABG    Answer:   Yes    Order Specific Question:   This test can only be performed at    Answer:   [place]   ECHOCARDIOGRAM COMPLETE    Standing Status:   Future    Standing Expiration Date:   06/14/2021    Order Specific Question:   Where should this test be performed    Answer:   [facility]    Order Specific Question:   Please indicate who you request to read the nuc med / echo results.    Answer:   [facility]    Order Specific Question:   Perflutren DEFINITY (image enhancing agent) should be administered unless hypersensitivity or allergy exist    Answer:   Administer Perflutren     Order Specific Question:   Reason for exam-Echo    Answer:   Dyspnea  R06.00   Split night study    Documented OSA    Standing Status:   Future    Standing Expiration Date:   12/13/2021    Order Specific Question:   Where should this test be performed:    Answer:   [facility]   Discussion:  Suspect that the patient's issues are related to extreme obesity with alveolar hypoventilation.  We will obtain PFTs and ABGs to confirm this.  We will also obtain 2D echo to evaluate for potential pulmonary hypertension particularly in the setting of extreme obesity and known obstructive sleep apnea.  Patient requires new sleep equipment.  Unfortunately prior data is not available.  Patient is also unsure what settings his BiPAP unit is set to and he is unable to tolerate.  Unit is over 64 years old.  We will obtain a split-night study to reassess this issue.  Deviously he states he did not tolerate CPAP and had to be transition to BiPAP.  I suspect that his sleep apnea is now more complex due to extreme obesity.  We will see the patient in follow-up in months time he is to contact us prior to that time should any new problems arise.  Renold Don, MD [facility]   *This note was dictated using voice recognition software/Dragon.  Despite best efforts to proofread, errors can occur which can change the meaning.  Any change was purely unintentional.

## 2020-12-13 NOTE — Patient Instructions (Signed)
We are repeating the sleep study to evaluate your sleep apnea this will be we will call split-night.  We are getting breathing tests and a blood oxygen and carbon dioxide test (these will be done the same day) and an echocardiogram which we will check your heart.  We will see you in follow-up in 2 to 3 months time call sooner should any new problems arise.  We will let you know the results of tests as they become available.

## 2020-12-20 ENCOUNTER — Encounter: Payer: Self-pay | Admitting: Gastroenterology

## 2020-12-26 ENCOUNTER — Encounter: Payer: Self-pay | Admitting: Family Medicine

## 2020-12-26 MED ORDER — CIPROFLOXACIN HCL 500 MG PO TABS: 500.0000 mg | ORAL_TABLET | Freq: Two times a day (BID) | ORAL | 0 refills | Status: AC

## 2020-12-26 MED ORDER — METRONIDAZOLE 500 MG PO TABS: 500.0000 mg | ORAL_TABLET | Freq: Two times a day (BID) | ORAL | 0 refills | Status: AC

## 2020-12-26 NOTE — Telephone Encounter (Signed)
Sent medication to pharmacy.   

## 2021-01-04 ENCOUNTER — Telehealth: Payer: Self-pay | Admitting: Gastroenterology

## 2021-01-04 NOTE — Telephone Encounter (Signed)
Called patient with regards to his MyChart message.  He reports that his abdominal pain has finally subsided at 12 AM today.  He had history of ventral hernia that he underwent repair with mesh placement.  He said he had eaten a large meal which has led to significant amount of pain.  He is watchful of what he is eating now.  He is trying to have a small portion meal only.  It is possible that patient may had a transient partial small bowel obstruction or flareup of pancreatic insufficiency triggered after a large meal  Told him to continue taking pancreatic enzymes and adhere to low-fat low-carb diet Patient will call my office back if the pain recurs  Sherri Sear, MD

## 2021-01-06 DIAGNOSIS — J9601 Acute respiratory failure with hypoxia: Secondary | ICD-10-CM | POA: Diagnosis not present

## 2021-01-07 ENCOUNTER — Telehealth: Payer: Self-pay

## 2021-01-07 NOTE — Telephone Encounter (Signed)
Patient is aware of date/time of covid test and voiced her understanding.  Nothing further needed at this time.

## 2021-01-09 ENCOUNTER — Other Ambulatory Visit: Payer: Self-pay

## 2021-01-09 ENCOUNTER — Other Ambulatory Visit
Admission: RE | Admit: 2021-01-09 | Discharge: 2021-01-09 | Disposition: A | Discharge status: Home / Self Care | Payer: Medicare Other | Source: Ambulatory Visit | Attending: Pulmonary Disease | Admitting: Pulmonary Disease

## 2021-01-09 DIAGNOSIS — Z01812 Encounter for preprocedural laboratory examination: Secondary | ICD-10-CM | POA: Insufficient documentation

## 2021-01-09 DIAGNOSIS — Z20822 Contact with and (suspected) exposure to covid-19: Secondary | ICD-10-CM | POA: Insufficient documentation

## 2021-01-09 LAB — SARS CORONAVIRUS 2 (TAT 6-24 HRS): SARS Coronavirus 2: NEGATIVE

## 2021-01-10 ENCOUNTER — Ambulatory Visit (HOSPITAL_BASED_OUTPATIENT_CLINIC_OR_DEPARTMENT_OTHER): Payer: Medicare Other

## 2021-01-10 ENCOUNTER — Ambulatory Visit
Admission: RE | Admit: 2021-01-10 | Discharge: 2021-01-10 | Disposition: A | Discharge status: Home / Self Care | Payer: Medicare Other | Source: Ambulatory Visit | Attending: Pulmonary Disease | Admitting: Pulmonary Disease

## 2021-01-10 DIAGNOSIS — R0609 Other forms of dyspnea: Secondary | ICD-10-CM

## 2021-01-10 DIAGNOSIS — R0902 Hypoxemia: Secondary | ICD-10-CM | POA: Diagnosis not present

## 2021-01-10 DIAGNOSIS — R0600 Dyspnea, unspecified: Secondary | ICD-10-CM

## 2021-01-10 DIAGNOSIS — R0602 Shortness of breath: Secondary | ICD-10-CM | POA: Insufficient documentation

## 2021-01-10 LAB — BLOOD GAS, ARTERIAL
Acid-Base Excess: 9.9 mmol/L — ABNORMAL HIGH (ref 0.0–2.0)
Bicarbonate: 34.3 mmol/L — ABNORMAL HIGH (ref 20.0–28.0)
FIO2: 0.21
O2 Saturation: 94.9 %
Patient temperature: 37
pCO2 arterial: 44 mmHg (ref 32.0–48.0)
pH, Arterial: 7.5 — ABNORMAL HIGH (ref 7.350–7.450)
pO2, Arterial: 68 mmHg — ABNORMAL LOW (ref 83.0–108.0)

## 2021-01-10 LAB — ECHOCARDIOGRAM COMPLETE
AV Mean grad: 2 mmHg
AV Peak grad: 4.2 mmHg
Ao pk vel: 1.02 m/s
Area-P 1/2: 2.67 cm2
Single Plane A4C EF: 45.6 %

## 2021-01-10 MED ORDER — ALBUTEROL SULFATE (2.5 MG/3ML) 0.083% IN NEBU
2.5000 mg | INHALATION_SOLUTION | Freq: Once | RESPIRATORY_TRACT | Status: AC
  Administered 2021-01-10: 2.5 mg via RESPIRATORY_TRACT
  Filled 2021-01-10: qty 3

## 2021-01-10 MED ORDER — PERFLUTREN LIPID MICROSPHERE
1.0000 mL | INTRAVENOUS | Status: AC | PRN
  Administered 2021-01-10: 3 mL via INTRAVENOUS
  Filled 2021-01-10: qty 10

## 2021-01-10 NOTE — Progress Notes (Signed)
*  PRELIMINARY RESULTS* Echocardiogram 2D Echocardiogram has been performed.  Ernest [name] 01/10/2021, 11:44 AM

## 2021-01-11 ENCOUNTER — Ambulatory Visit: Payer: Medicare Other | Attending: Pulmonary Disease

## 2021-01-11 DIAGNOSIS — R0600 Dyspnea, unspecified: Secondary | ICD-10-CM | POA: Insufficient documentation

## 2021-01-11 DIAGNOSIS — G4733 Obstructive sleep apnea (adult) (pediatric): Secondary | ICD-10-CM | POA: Insufficient documentation

## 2021-01-14 ENCOUNTER — Other Ambulatory Visit: Payer: Self-pay

## 2021-01-14 NOTE — Telephone Encounter (Signed)
Dr. Patsey Berthold, please release PFT to mychart per patient request. Thanks

## 2021-01-14 NOTE — Telephone Encounter (Signed)
I cannot release the result because that he is assigned to Dr. Mortimer Fries to read.  I reviewed the test myself and I wanted him to know the results before it was released.  I will see if they can reassign the test to me so that it can be released.

## 2021-01-28 ENCOUNTER — Telehealth (INDEPENDENT_AMBULATORY_CARE_PROVIDER_SITE_OTHER): Payer: Medicare Other | Admitting: Pulmonary Disease

## 2021-01-28 DIAGNOSIS — G4733 Obstructive sleep apnea (adult) (pediatric): Secondary | ICD-10-CM | POA: Diagnosis not present

## 2021-01-28 NOTE — Telephone Encounter (Signed)
Severe OSA , AHI 38/h but sleep too fragmented to apply PAP  Suggest : Return to sleep lab for PAP titration, given previous h/o bipap requirement

## 2021-01-29 ENCOUNTER — Ambulatory Visit: Payer: Medicare Other | Admitting: Gastroenterology

## 2021-01-29 NOTE — Telephone Encounter (Signed)
Called and spoke with patient to let him know of sleep study results from Dr. Elsworth Soho and Dr. Patsey Berthold and to let him know that she would like to do additional testing for a Bipap titration study. He expressed understanding. Order has been placed and advised patient that he would get a phone call to get it scheduled. Nothing further needed at this time.

## 2021-01-29 NOTE — Telephone Encounter (Signed)
Patient has severe OSA, AHI 38.  They were unable to do split-night study due to sleep fragmentation.  He needs a formal titration study please order same.

## 2021-01-29 NOTE — Addendum Note (Signed)
Addended by: Elby Beck R on: 01/29/2021 02:38 PM   Modules accepted: Orders

## 2021-02-06 DIAGNOSIS — J9601 Acute respiratory failure with hypoxia: Secondary | ICD-10-CM | POA: Diagnosis not present

## 2021-02-12 ENCOUNTER — Other Ambulatory Visit
Admission: RE | Admit: 2021-02-12 | Discharge: 2021-02-12 | Disposition: A | Discharge status: Home / Self Care | Payer: Medicare Other | Source: Ambulatory Visit | Attending: Pulmonary Disease | Admitting: Pulmonary Disease

## 2021-02-12 ENCOUNTER — Other Ambulatory Visit: Payer: Self-pay | Admitting: Gastroenterology

## 2021-02-12 ENCOUNTER — Ambulatory Visit (INDEPENDENT_AMBULATORY_CARE_PROVIDER_SITE_OTHER): Payer: Medicare Other | Admitting: Gastroenterology

## 2021-02-12 ENCOUNTER — Other Ambulatory Visit: Payer: Self-pay

## 2021-02-12 ENCOUNTER — Encounter: Payer: Self-pay | Admitting: Gastroenterology

## 2021-02-12 VITALS — BP 131/85 | HR 128 | Temp 98.4°F | Ht 73.0 in | Wt 390.4 lb

## 2021-02-12 DIAGNOSIS — K8681 Exocrine pancreatic insufficiency: Secondary | ICD-10-CM | POA: Diagnosis not present

## 2021-02-12 DIAGNOSIS — Z20822 Contact with and (suspected) exposure to covid-19: Secondary | ICD-10-CM | POA: Insufficient documentation

## 2021-02-12 DIAGNOSIS — Z01812 Encounter for preprocedural laboratory examination: Secondary | ICD-10-CM | POA: Insufficient documentation

## 2021-02-12 MED ORDER — PANCRELIPASE (LIP-PROT-AMYL) 36000-114000 UNITS PO CPEP: ORAL_CAPSULE | ORAL | 1 refills | Status: DC

## 2021-02-12 NOTE — Progress Notes (Signed)
[name], MD [address]  [place]  [place], [place] [address]  Main: [phone]  Fax: [phone]    Gastroenterology Consultation  Referring Provider:     [name], MD Primary Care Physician:  [name], MD Primary Gastroenterologist:  Dr. [name] Reason for Consultation: Exocrine pancreatic insufficiency        HPI:   Ernest [name] is a 75 y.o. male referred by Dr. [name], MD  for consultation & management of upper abdominal pain.  Patient reports that since Christmas, he has been experiencing sporadic episodes of sharp upper abdominal pain, he points towards upper mid abdomen without any radiation.  Pain is triggered after heavy meal or fatty meal.  He feels like a gas bubble sitting in his upper abdomen associated with gurgling.  He has been taking Protonix 40 mg daily.  He has morbid obesity, obstructive sleep apnea, diastolic heart failure.  He is not on home oxygen.  He had ultrasound abdomen which revealed cholelithiasis.  His transaminases were normal as well as alkaline phosphatase.  He is noticed to have mild anemia, MCV normal.  Patient reports that he had upper endoscopy and colonoscopy by [facility] more than 20 years ago  Follow-up visit 02/12/2021 Patient is here for follow-up of abdominal bloating associated with upper abdominal discomfort and loose stools.  Patient underwent extensive work-up, found to have severe exocrine pancreatic insufficiency.  I have started him on Creon 36K 3 capsules with each meal and 1-2 with snack.  Patient reports that his bloating, and upper abdominal discomfort seem to have improved on Creon.  He ran out of medication.  He does acknowledge eating a lot of cheese and dairy products daily.  He also underwent colonoscopy, found to have several polyps and one large polyp in the appendiceal orifice.  He was consulted by colorectal surgeon and patient decided to defer surgery at this time given  his extensive abdominal surgeries for ventral hernia and mesh placement.  Patient is currently not taking any iron supplements.  NSAIDs: Taking Excedrin for localized left frontal headache for the last 2 days every 6 hours  Antiplts/Anticoagulants/Anti thrombotics: None  GI Procedures: EGD and colonoscopy more than 20 years ago  EGD and colonoscopy 11/01/2020  - Normal duodenal bulb and second portion of the duodenum. Biopsied. - Normal stomach. Biopsied. - Esophagogastric landmarks identified. - Z-line irregular, 45 cm from the incisors. - Normal esophagus.  - One 10 mm polyp at the appendiceal orifice. Resection not attempted. - One 12 mm polyp in the cecum, removed with a hot snare. Resected and retrieved. - Two 5 mm polyps in the ascending colon and in the cecum, removed with a cold snare. Resected and retrieved. - Two 7 to 9 mm polyps in the transverse colon, removed with a hot snare. Resected and retrieved. - Four 4 to 5 mm polyps in the transverse colon, removed with a cold snare. Resected and retrieved. - Four 3 to 5 mm polyps in the rectum, removed with a cold snare. Resected and retrieved. - The distal rectum and anal verge are normal on retroflexion view.  DIAGNOSIS:  A. DUODENUM; COLD BIOPSY:  - DUODENAL MUCOSA WITH NO SIGNIFICANT PATHOLOGIC ALTERATION.  - NEGATIVE FOR FEATURES OF CELIAC DISEASE.  - NEGATIVE FOR DYSPLASIA AND MALIGNANCY.   B. STOMACH, RANDOM; COLD BIOPSY:  - ANTRAL AND OXYNTIC MUCOSA WITH NO SIGNIFICANT PATHOLOGIC ALTERATION.  - NEGATIVE FOR ACTIVE INFLAMMATION AND H PYLORI.  -  NEGATIVE FOR INTESTINAL METAPLASIA, DYSPLASIA, AND MALIGNANCY.   C. COLON POLYP X3, CECUM AND ASCENDING; HOT AND COLD SNARE:  - TUBULAR ADENOMA, MULTIPLE FRAGMENTS.  - NEGATIVE FOR HIGH-GRADE DYSPLASIA AND MALIGNANCY.   D. COLON POLYP X6, TRANSVERSE; HOT AND COLD SNARE:  - SESSILE SERRATED POLYP, NUMEROUS FRAGMENTS.  - TUBULAR ADENOMA, NUMEROUS FRAGMENTS.  - NEGATIVE FOR  HIGH-GRADE DYSPLASIA AND MALIGNANCY.   E. RECTUM POLYP X4; COLD SNARE:  - SESSILE SERRATED POLYP, NUMEROUS FRAGMENTS.  - NEGATIVE FOR DYSPLASIA AND MALIGNANCY.  Past Medical History:  Diagnosis Date   Chronic venous insufficiency of lower extremity    GERD (gastroesophageal reflux disease)    Sleep apnea     Past Surgical History:  Procedure Laterality Date   CATARACT EXTRACTION W/PHACO Left 08/06/2020   Procedure: CATARACT EXTRACTION PHACO AND INTRAOCULAR LENS PLACEMENT ([place]) LEFT;  Surgeon: Eulogio Bear, MD;  Location: [place];  Service: Ophthalmology;  Laterality: Left;  3.41 0:31.5   CATARACT EXTRACTION W/PHACO Right 08/27/2020   Procedure: CATARACT EXTRACTION PHACO AND INTRAOCULAR LENS PLACEMENT ([facility]) RIGHT 4.10 00:41.6;  Surgeon: Eulogio Bear, MD;  Location: [place];  Service: Ophthalmology;  Laterality: Right;   COLONOSCOPY WITH ESOPHAGOGASTRODUODENOSCOPY (EGD)     COLONOSCOPY WITH PROPOFOL N/A 11/01/2020   Procedure: COLONOSCOPY WITH PROPOFOL;  Surgeon: Lin Landsman, MD;  Location: [facility] ENDOSCOPY;  Service: Gastroenterology;  Laterality: N/A;   ESOPHAGOGASTRODUODENOSCOPY (EGD) WITH PROPOFOL N/A 11/01/2020   Procedure: ESOPHAGOGASTRODUODENOSCOPY (EGD) WITH PROPOFOL;  Surgeon: Lin Landsman, MD;  Location: [facility] ENDOSCOPY;  Service: Gastroenterology;  Laterality: N/A;   HERNIA REPAIR     inguinal and umbilical    Current Outpatient Medications:    acetaminophen (TYLENOL) 325 MG tablet, Take 325 mg by mouth every 8 (eight) hours as needed., Disp: , Rfl:    amiloride-hydrochlorothiazide (MODURETIC) 5-50 MG tablet, Hold until followup with outpatient doctor due to your blood pressure being low normal., Disp: 30 tablet, Rfl: 6   co-enzyme Q-10 30 MG capsule, Take 30 mg by mouth 3 (three) times daily., Disp: , Rfl:    fluticasone (FLONASE) 50 MCG/ACT nasal spray, Place 1 spray into the nose daily., Disp: , Rfl:    lipase/protease/amylase  (CREON) 36000 UNITS CPEP capsule, Take 3 capsules with the first bite of each meal and 2 capsules with the first bite of each snack, Disp: 390 capsule, Rfl: 1   Misc Natural Products (OSTEO BI-FLEX JOINT SHIELD PO), Take by mouth., Disp: , Rfl:    Multiple Vitamins-Minerals (CENTRUM SILVER ADULT 50+ PO), Take by mouth., Disp: , Rfl:    Multiple Vitamins-Minerals (PRESERVISION AREDS 2) CAPS, Take by mouth., Disp: , Rfl:    nystatin (MYCOSTATIN/NYSTOP) powder, Apply 1 application topically 3 (three) times daily., Disp: , Rfl:    ondansetron (ZOFRAN) 4 MG tablet, Take 1 tablet (4 mg total) by mouth every 8 (eight) hours as needed for nausea or vomiting., Disp: 14 tablet, Rfl: 0   pantoprazole (PROTONIX) 40 MG tablet, TAKE 1 TABLET BY MOUTH EVERY DAY, Disp: 90 tablet, Rfl: 3   Probiotic Product (PROBIOTIC DAILY PO), Take by mouth., Disp: , Rfl:    Turmeric 500 MG CAPS, Take 500 mg by mouth daily., Disp: , Rfl:    Ferrous Sulfate (IRON PO), Take 1 capsule by mouth daily. (Patient not taking: Reported on 02/12/2021), Disp: , Rfl:    metoprolol succinate (TOPROL-XL) 25 MG 24 hr tablet, Take 1 tablet (25 mg total) by mouth daily. This is a decrease  from your prior 50 mg daily., Disp: 30 tablet, Rfl: 6  No family history on file.   Social History   Tobacco Use   Smoking status: Never   Smokeless tobacco: Never  Substance Use Topics   Alcohol use: Yes    Alcohol/week: 0.0 standard drinks    Comment: rarely   Drug use: Not Currently    Allergies as of 02/12/2021   (No Known Allergies)    Review of Systems:    All systems reviewed and negative except where noted in HPI.   Physical Exam:  BP 131/85 (BP Location: Left Arm, Patient Position: Sitting, Cuff Size: Large)   Pulse (!) 128   Temp 98.4 F (36.9 C) (Oral)   Ht '6\' 1"'$  (1.854 m)   Wt (!) 390 lb 6 oz (177.1 kg)   BMI 51.50 kg/m  No LMP for male patient.  General:   Alert,  Well-developed, well-nourished, pleasant and cooperative in  NAD Head:  Normocephalic and atraumatic. Eyes:  Sclera clear, no icterus.   Conjunctiva pink. Ears:  Normal auditory acuity. Nose:  No deformity, discharge, or lesions. Mouth:  No deformity or lesions,oropharynx pink & moist. Neck:  Supple; no masses or thyromegaly. Lungs:  Respirations even and unlabored.  Clear throughout to auscultation.   No wheezes, crackles, or rhonchi. No acute distress. Heart:  Regular rate and rhythm; no murmurs, clicks, rubs, or gallops. Abdomen:  Normal bowel sounds. Soft, non-tender and moderately distended, tympanic without masses, hepatosplenomegaly or hernias noted.  No guarding or rebound tenderness.   Rectal: Not performed Msk:  Symmetrical without gross deformities. Good, equal movement & strength bilaterally. Pulses:  Normal pulses noted. Extremities:  No clubbing or edema.  No cyanosis. Neurologic:  Alert and oriented x3;  grossly normal neurologically. Skin:  Intact without significant lesions or rashes. No jaundice. Psych:  Alert and cooperative. Normal mood and affect.  Imaging Studies: Reviewed  Assessment and Plan:   Ernest [name] is a 74 y.o. male with morbid obesity, BMI 52 is seen in consultation for abdominal bloating associated with upper abdominal discomfort and loose stools, diagnosed with severe exocrine pancreatic insufficiency based on pancreatic fecal elastase levels  Severe EPI: Continue Creon 36K take 3 capsules with each meal 1-2 with snack Given that patient still has ongoing abdominal bloating, associated with epigastric discomfort discussed about lactose intolerance causing the symptoms.  Advised him to try strict lactose-free diet.  If his symptoms are persistent, will refer him to general surgery to evaluate for cholecystectomy given history of cholelithiasis  Appendiceal orifice polyp Patient has been consulted by colorectal surgeon and patient deferred to undergo surgical resection of the polyp at this time.  Patient  understands that the polyp has potential to grow in next few years and turn into cancer.  He prefers to undergo close surveillance with colonoscopy.  Patient is due for surveillance colonoscopy in 10/2020  Iron deficiency anemia: Currently resolved EGD and colonoscopy did not reveal source of anemia  Follow up in 6 months   [name], MD

## 2021-02-13 LAB — SARS CORONAVIRUS 2 (TAT 6-24 HRS): SARS Coronavirus 2: NEGATIVE

## 2021-02-14 ENCOUNTER — Ambulatory Visit: Payer: Medicare Other | Attending: Pulmonary Disease

## 2021-02-14 DIAGNOSIS — G4733 Obstructive sleep apnea (adult) (pediatric): Secondary | ICD-10-CM | POA: Insufficient documentation

## 2021-02-15 ENCOUNTER — Other Ambulatory Visit: Payer: Self-pay

## 2021-02-18 ENCOUNTER — Telehealth (INDEPENDENT_AMBULATORY_CARE_PROVIDER_SITE_OTHER): Payer: Medicare Other | Admitting: Pulmonary Disease

## 2021-02-18 DIAGNOSIS — G4733 Obstructive sleep apnea (adult) (pediatric): Secondary | ICD-10-CM

## 2021-02-18 NOTE — Telephone Encounter (Signed)
Recommend BiPAP 12/7

## 2021-02-19 NOTE — Telephone Encounter (Signed)
Patient will need order for BiPAP 12/7 in spontaneous mode.

## 2021-02-19 NOTE — Telephone Encounter (Signed)
Lm for patient.  

## 2021-02-20 DIAGNOSIS — G4733 Obstructive sleep apnea (adult) (pediatric): Secondary | ICD-10-CM

## 2021-02-20 NOTE — Telephone Encounter (Signed)
Patient is aware of results and voiced his understanding.  Order placed for bipap. 75morecall placed. Nothing further needed at this time.

## 2021-02-27 ENCOUNTER — Encounter: Payer: Self-pay | Admitting: Gastroenterology

## 2021-02-27 DIAGNOSIS — R140 Abdominal distension (gaseous): Secondary | ICD-10-CM

## 2021-02-27 DIAGNOSIS — G8929 Other chronic pain: Secondary | ICD-10-CM

## 2021-02-27 DIAGNOSIS — R109 Unspecified abdominal pain: Secondary | ICD-10-CM

## 2021-02-27 DIAGNOSIS — K439 Ventral hernia without obstruction or gangrene: Secondary | ICD-10-CM

## 2021-03-09 DIAGNOSIS — J9601 Acute respiratory failure with hypoxia: Secondary | ICD-10-CM | POA: Diagnosis not present

## 2021-03-24 DIAGNOSIS — G4733 Obstructive sleep apnea (adult) (pediatric): Secondary | ICD-10-CM

## 2021-04-01 ENCOUNTER — Other Ambulatory Visit: Payer: Self-pay

## 2021-04-01 ENCOUNTER — Ambulatory Visit
Admission: RE | Admit: 2021-04-01 | Discharge: 2021-04-01 | Disposition: A | Discharge status: Home / Self Care | Payer: Medicare Other | Source: Ambulatory Visit | Attending: Gastroenterology | Admitting: Gastroenterology

## 2021-04-01 DIAGNOSIS — R1013 Epigastric pain: Secondary | ICD-10-CM | POA: Diagnosis not present

## 2021-04-01 DIAGNOSIS — R109 Unspecified abdominal pain: Secondary | ICD-10-CM

## 2021-04-01 DIAGNOSIS — K56600 Partial intestinal obstruction, unspecified as to cause: Secondary | ICD-10-CM | POA: Insufficient documentation

## 2021-04-01 DIAGNOSIS — K439 Ventral hernia without obstruction or gangrene: Secondary | ICD-10-CM

## 2021-04-01 DIAGNOSIS — R140 Abdominal distension (gaseous): Secondary | ICD-10-CM

## 2021-04-01 DIAGNOSIS — G8929 Other chronic pain: Secondary | ICD-10-CM | POA: Diagnosis not present

## 2021-04-01 LAB — POCT I-STAT CREATININE: Creatinine, Ser: 0.8 mg/dL (ref 0.61–1.24)

## 2021-04-01 MED ORDER — IOHEXOL 350 MG/ML SOLN
125.0000 mL | Freq: Once | INTRAVENOUS | Status: AC | PRN
  Administered 2021-04-01: 125 mL via INTRAVENOUS

## 2021-04-02 ENCOUNTER — Encounter: Payer: Self-pay | Admitting: Gastroenterology

## 2021-04-02 NOTE — Telephone Encounter (Signed)
Placed referral  

## 2021-04-04 DIAGNOSIS — R0681 Apnea, not elsewhere classified: Secondary | ICD-10-CM | POA: Diagnosis not present

## 2021-04-04 DIAGNOSIS — G4733 Obstructive sleep apnea (adult) (pediatric): Secondary | ICD-10-CM | POA: Diagnosis not present

## 2021-04-04 NOTE — Telephone Encounter (Signed)
Routing to Dr. Patsey Berthold as an Jul[facility]ier,

## 2021-04-04 NOTE — Telephone Encounter (Signed)
We can contact adapt to see what final settings he had and we can send an order for this.  I recommend that he be scheduled to see Dr. Halford Chessman and sleep medicine as that is his main issue.  He has complex sleep apnea.

## 2021-04-05 NOTE — Telephone Encounter (Addendum)
Per Melissa with adapt.  Patient's current bipap settings are 8/25 PS 6 Order placed.   Routing to Dr. Patsey Berthold.

## 2021-04-05 NOTE — Telephone Encounter (Signed)
Noted.  Agree that the best thing would be to get him transitioned to sleep medicine.

## 2021-04-05 NOTE — Telephone Encounter (Signed)
Spoke to [place] with [employer] and requested cpap settings.  Ernest [name] will research this and call back.

## 2021-04-08 DIAGNOSIS — J9601 Acute respiratory failure with hypoxia: Secondary | ICD-10-CM | POA: Diagnosis not present

## 2021-04-09 ENCOUNTER — Encounter: Payer: Self-pay | Admitting: Gastroenterology

## 2021-04-09 ENCOUNTER — Other Ambulatory Visit: Payer: Self-pay | Admitting: Gastroenterology

## 2021-04-09 DIAGNOSIS — R109 Unspecified abdominal pain: Secondary | ICD-10-CM

## 2021-04-09 DIAGNOSIS — K56600 Partial intestinal obstruction, unspecified as to cause: Secondary | ICD-10-CM | POA: Diagnosis not present

## 2021-04-10 ENCOUNTER — Other Ambulatory Visit: Payer: Self-pay | Admitting: *Deleted

## 2021-04-10 MED ORDER — TRAMADOL HCL 50 MG PO TABS: 50.0000 mg | ORAL_TABLET | Freq: Every day | ORAL | 0 refills | Status: AC

## 2021-04-16 ENCOUNTER — Encounter: Payer: Self-pay | Admitting: Gastroenterology

## 2021-04-17 ENCOUNTER — Telehealth: Payer: Self-pay

## 2021-04-17 MED ORDER — CIPROFLOXACIN HCL 500 MG PO TABS: 500.0000 mg | ORAL_TABLET | Freq: Two times a day (BID) | ORAL | 0 refills | Status: DC

## 2021-04-17 MED ORDER — METRONIDAZOLE 500 MG PO TABS: 500.0000 mg | ORAL_TABLET | Freq: Two times a day (BID) | ORAL | 0 refills | Status: AC

## 2021-04-17 NOTE — Telephone Encounter (Signed)
Spoke with patient let him know Dr Marius Ditch did want two meds sent in for home as he requested cipro and flagyl so they are sent in to pharmacy

## 2021-04-23 ENCOUNTER — Encounter: Payer: Self-pay | Admitting: Gastroenterology

## 2021-04-25 ENCOUNTER — Encounter: Payer: Self-pay | Admitting: Gastroenterology

## 2021-04-29 DIAGNOSIS — H353221 Exudative age-related macular degeneration, left eye, with active choroidal neovascularization: Secondary | ICD-10-CM | POA: Diagnosis not present

## 2021-04-29 DIAGNOSIS — Z961 Presence of intraocular lens: Secondary | ICD-10-CM | POA: Diagnosis not present

## 2021-05-01 ENCOUNTER — Ambulatory Visit (INDEPENDENT_AMBULATORY_CARE_PROVIDER_SITE_OTHER): Payer: Medicare Other | Admitting: Pulmonary Disease

## 2021-05-01 ENCOUNTER — Other Ambulatory Visit: Payer: Self-pay

## 2021-05-01 ENCOUNTER — Encounter: Payer: Self-pay | Admitting: Pulmonary Disease

## 2021-05-01 VITALS — BP 122/80 | HR 101 | Temp 97.8°F | Ht 73.0 in | Wt 367.2 lb

## 2021-05-01 DIAGNOSIS — E662 Morbid (severe) obesity with alveolar hypoventilation: Secondary | ICD-10-CM

## 2021-05-01 DIAGNOSIS — G4733 Obstructive sleep apnea (adult) (pediatric): Secondary | ICD-10-CM | POA: Diagnosis not present

## 2021-05-01 NOTE — Patient Instructions (Signed)
Will have [facility] refit your Bipap mask  Follow up in 6 months

## 2021-05-01 NOTE — Progress Notes (Addendum)
[facility]  Chief Complaint  Patient presents with   Follow-up    Sleep apnea      Past Surgical History:  He  has a past surgical history that includes Hernia repair; Cataract extraction w/PHACO (Left, 08/06/2020); Cataract extraction w/PHACO (Right, 08/27/2020); Colonoscopy with esophagogastroduodenoscopy (egd); Colonoscopy with propofol (N/A, 11/01/2020); and Esophagogastroduodenoscopy (egd) with propofol (N/A, 11/01/2020).  Past Medical History:  Anemia, GERD  Constitutional:  BP 122/80 (BP Location: Left Arm, Patient Position: Sitting, Cuff Size: Normal)   Pulse (!) 101   Temp 97.8 F (36.6 C) (Oral)   Ht 6\' 1"  (1.854 m)   Wt (!) 367 lb 3.2 oz (166.6 kg)   SpO2 97%   BMI 48.45 kg/m   Brief Summary:  Ernest [name] is a 75 y.o. male with       Subjective:   He was seen initially by Dr. Patsey Berthold in June for dyspnea and hypoxia.  He had sleep study in July that showed severe obstructive sleep apnea.  At the time of his study he weighed 391 lbs.  He had titration study in August.  CPAP was not sufficient to control his sleep apnea.  He was titrated on Bipap with reduction in AHI to 5.  He was set up with Bipap through [place].  Since then he was found to have bowel obstruction.  He has lost about 30 lbs.  He had ABG with showed low oxygen level.  He has been using 2 liters oxygen intermittently during the day.  He has full face mask, but mask slips and leaks.  He would like to change to a nasal mask.  Physical Exam:   Appearance - well kempt   ENMT - no sinus tenderness, no oral exudate, no LAN, Mallampati 3 airway, no stridor  Respiratory - equal breath sounds bilaterally, no wheezing or rales  CV - s1s2 regular rate and rhythm, no murmurs  Ext - no clubbing, no edema  Skin - no rashes  Psych - normal mood and affect   Pulmonary testing:  ABG RA 01/10/21 >> pH 7.50, PCO2 44, PO2 68 PFT 01/10/21 >> 2.86 (81%), FEV1%  69, TLC 6.70 (87%), DLCO 121%  Chest Imaging:  CT angio chest 10/05/20 >> no intrathoracic findings.  Sleep Tests:  PSG 01/11/21 >> AHI 38.8, SpO2 low 75% Bipap titration 02/14/21 >> Bipap 12/7 cm H2O Auto Bipap 04/01/21 to 04/30/21 >> used on 22 of 30 nights with average 4 hrs.  Average AHI 0.1 with median Bipap 12/7 and 95 th percentile Bipap 13/8 cm H2O.  Cardiac Tests:  Echo 01/10/21 >> EF 50 to 55%, grade 1 DD  Social History:  He  reports that he has never smoked. He has never used smokeless tobacco. He reports current alcohol use. He reports that he does not currently use drugs.  Family History:  His family history is not on file.     Assessment/Plan:   Obstructive sleep apnea. - he is compliant with Bipap and reports benefit from therapy - uses [facility] for his DME - continue auto Bipap with PS 5, max IPAP 25, min EPAP 7 cm H2O - will arrange for refitting of his mask  Obesity hypoventilation syndrome. - uses 2 liters oxygen with exertion - goal SpO2 > 90% - if he continues to lose weight, then we could revisit his need for supplemental oxygen at next visit  Exocrine pancreatic insufficiency, small bowel obstruction. - followed by Dr. Virgil Benedict  Raeanne Gathers with [facility] - he is planning to do physical therapy to break up adhesions that caused SBO  Time Spent Involved in Patient Care on Day of Examination:  34 minutes  Follow up:   Patient Instructions  Will have [facility] refit your Bipap mask  Follow up in 6 months  Medication List:   Allergies as of 05/01/2021   No Known Allergies      Medication List        Accurate as of May 01, 2021 11:47 AM. If you have any questions, ask your nurse or doctor.          STOP taking these medications    lipase/protease/amylase 36000 UNITS Cpep capsule Commonly known as: Creon Stopped by: Chesley Mires, MD   ondansetron 4 MG tablet Commonly known as: ZOFRAN Stopped by: Chesley Mires, MD        TAKE these medications    acetaminophen 325 MG tablet Commonly known as: TYLENOL Take 325 mg by mouth every 8 (eight) hours as needed.   amiloride-hydrochlorothiazide 5-50 MG tablet Commonly known as: MODURETIC Hold until followup with outpatient doctor due to your blood pressure being low normal.   ciprofloxacin 500 MG tablet Commonly known as: CIPRO Take 1 tablet (500 mg total) by mouth 2 (two) times daily.   co-enzyme Q-10 30 MG capsule Take 30 mg by mouth 3 (three) times daily.   fluticasone 50 MCG/ACT nasal spray Commonly known as: FLONASE Place 1 spray into the nose daily.   metoprolol succinate 25 MG 24 hr tablet Commonly known as: TOPROL-XL Take 1 tablet (25 mg total) by mouth daily. This is a decrease from your prior 50 mg daily.   metroNIDAZOLE 500 MG tablet Commonly known as: FLAGYL Take 1 tablet (500 mg total) by mouth 2 (two) times daily for 14 days.   nystatin powder Commonly known as: MYCOSTATIN/NYSTOP Apply 1 application topically 3 (three) times daily.   OSTEO BI-FLEX JOINT SHIELD PO Take by mouth.   pantoprazole 40 MG tablet Commonly known as: PROTONIX TAKE 1 TABLET BY MOUTH EVERY DAY   PreserVision AREDS 2 Caps Take by mouth.   CENTRUM SILVER ADULT 50+ PO Take by mouth.   PROBIOTIC DAILY PO Take by mouth.   Turmeric 500 MG Caps Take 500 mg by mouth daily.        Signature:  Chesley Mires, MD [place] Pager - [phone] 05/01/2021, 11:47 AM

## 2021-05-05 DIAGNOSIS — G4733 Obstructive sleep apnea (adult) (pediatric): Secondary | ICD-10-CM | POA: Diagnosis not present

## 2021-05-05 DIAGNOSIS — R0681 Apnea, not elsewhere classified: Secondary | ICD-10-CM | POA: Diagnosis not present

## 2021-05-07 DIAGNOSIS — R0681 Apnea, not elsewhere classified: Secondary | ICD-10-CM | POA: Diagnosis not present

## 2021-05-07 DIAGNOSIS — G4733 Obstructive sleep apnea (adult) (pediatric): Secondary | ICD-10-CM | POA: Diagnosis not present

## 2021-05-08 DIAGNOSIS — R0681 Apnea, not elsewhere classified: Secondary | ICD-10-CM | POA: Diagnosis not present

## 2021-05-08 DIAGNOSIS — G4733 Obstructive sleep apnea (adult) (pediatric): Secondary | ICD-10-CM | POA: Diagnosis not present

## 2021-05-09 DIAGNOSIS — J9601 Acute respiratory failure with hypoxia: Secondary | ICD-10-CM | POA: Diagnosis not present

## 2021-06-04 DIAGNOSIS — R0681 Apnea, not elsewhere classified: Secondary | ICD-10-CM | POA: Diagnosis not present

## 2021-06-04 DIAGNOSIS — G4733 Obstructive sleep apnea (adult) (pediatric): Secondary | ICD-10-CM | POA: Diagnosis not present

## 2021-06-05 ENCOUNTER — Encounter: Payer: Self-pay | Admitting: Pulmonary Disease

## 2021-06-06 NOTE — Telephone Encounter (Signed)
Called and spoke to [place] and let him know that patient no longer wants to continue BIPAP. Nothing further needed.

## 2021-06-06 NOTE — Telephone Encounter (Signed)
Okay to send order to have his Bipap machine and supplies discontinued.  He should still use supplemental oxygen with exertion and sleep.

## 2021-06-06 NOTE — Telephone Encounter (Signed)
Dr. Halford Chessman Please advise:   I have tried several different air masks and pressure settings on my Resmed 10 Bipap machine  and cannot aleviate the extreme aerophagia I experience (probably due to a partial bowel obstruction which compresses air in my small intestines). I can not sleep with this machine (nor probably any other machine). I think its time to turn the machine back in and stop all future replacement pillows and tubes. I think I need you to inform my insurance company officially.

## 2021-06-07 ENCOUNTER — Telehealth: Payer: Self-pay | Admitting: Pulmonary Disease

## 2021-06-07 DIAGNOSIS — G4733 Obstructive sleep apnea (adult) (pediatric): Secondary | ICD-10-CM

## 2021-06-07 NOTE — Telephone Encounter (Signed)
Spoke to patient, who is requesting an order to d/c bipap. Patient stated that he has attempted to wear bipap for 2 months and he can not tolerate it due to gas and stomach pain.  He stated that he returned machine to [facility] today.  Dr. Halford Chessman, please advise. Thanks

## 2021-06-08 DIAGNOSIS — J9601 Acute respiratory failure with hypoxia: Secondary | ICD-10-CM | POA: Diagnosis not present

## 2021-06-10 NOTE — Telephone Encounter (Signed)
Okay to send order to discontinue Bipap machine.

## 2021-06-10 NOTE — Telephone Encounter (Signed)
Order has been placed per Dr. Halford Chessman to cancel the Bipap. Closing encounter.

## 2021-06-21 ENCOUNTER — Ambulatory Visit (INDEPENDENT_AMBULATORY_CARE_PROVIDER_SITE_OTHER): Payer: Medicare Other

## 2021-06-21 DIAGNOSIS — Z0000 Encounter for general adult medical examination without abnormal findings: Secondary | ICD-10-CM

## 2021-06-21 NOTE — Progress Notes (Signed)
I have reviewed this visit and agree with the documentation.   

## 2021-06-21 NOTE — Progress Notes (Signed)
Subjective:   Ernest [name] is a 75 y.o. male who presents for Medicare Annual/Subsequent preventive examination.  I discussed the limitations of evaluation and management by telemedicine and the availability of in person appointments. The patient expressed understanding and agreed to proceed.   Visit performed by audio   Patient location: Home  Provider location: Home  Review of Systems    N/A       Objective:    Today's Vitals   06/19/21 1423  PainSc: 4    There is no height or weight on file to calculate BMI.  Advanced Directives 06/21/2021 11/01/2020 10/05/2020 10/05/2020 10/05/2020 08/27/2020 08/06/2020  Does Patient Have a Medical Advance Directive? Yes Yes - Yes Yes Yes Yes  Type of Advance Directive Living will;Out of facility DNR (pink MOST or yellow form);Healthcare Power of [place];Living will [place];Living will [place];Living will [place];Living will  Does patient want to make changes to medical advance directive? No - Patient declined - No - Patient declined - - No - Guardian declined No - Guardian declined  Copy of [place] in Chart? Yes - validated most recent copy scanned in chart (See row information) - - No - copy requested No - copy requested No - copy requested No - copy requested  Would patient like information on creating a medical advance directive? - - - No - Patient declined No - Patient declined - -    Current Medications (verified) Outpatient Encounter Medications as of 06/21/2021  Medication Sig   acetaminophen (TYLENOL) 325 MG tablet Take 325 mg by mouth every 8 (eight) hours as needed.   amiloride-hydrochlorothiazide (MODURETIC) 5-50 MG tablet Hold until followup with outpatient doctor due to your blood pressure being low normal.   co-enzyme Q-10 30 MG capsule Take 30 mg by mouth 3 (three) times daily.   fluticasone (FLONASE) 50  MCG/ACT nasal spray Place 1 spray into the nose daily.   Misc Natural Products (OSTEO BI-FLEX JOINT SHIELD PO) Take by mouth.   Multiple Vitamins-Minerals (CENTRUM SILVER ADULT 50+ PO) Take by mouth.   nystatin (MYCOSTATIN/NYSTOP) powder Apply 1 application topically 3 (three) times daily.   pantoprazole (PROTONIX) 40 MG tablet TAKE 1 TABLET BY MOUTH EVERY DAY   Probiotic Product (PROBIOTIC DAILY PO) Take by mouth.   Turmeric 500 MG CAPS Take 500 mg by mouth daily.   metoprolol succinate (TOPROL-XL) 25 MG 24 hr tablet Take 1 tablet (25 mg total) by mouth daily. This is a decrease from your prior 50 mg daily.   [DISCONTINUED] ciprofloxacin (CIPRO) 500 MG tablet Take 1 tablet (500 mg total) by mouth 2 (two) times daily.   [DISCONTINUED] Multiple Vitamins-Minerals (PRESERVISION AREDS 2) CAPS Take by mouth.   No facility-administered encounter medications on file as of 06/21/2021.    Allergies (verified) Patient has no known allergies.   History: Past Medical History:  Diagnosis Date   Anemia    Chronic venous insufficiency of lower extremity    GERD (gastroesophageal reflux disease)    Sleep apnea    Small bowel obstruction ([place]) 2022   Past Surgical History:  Procedure Laterality Date   CATARACT EXTRACTION W/PHACO Left 08/06/2020   Procedure: CATARACT EXTRACTION PHACO AND INTRAOCULAR LENS PLACEMENT ([place]) LEFT;  Surgeon: Eulogio Bear, MD;  Location: [facility];  Service: Ophthalmology;  Laterality: Left;  3.41 0:31.5   CATARACT EXTRACTION W/PHACO Right 08/27/2020  Procedure: CATARACT EXTRACTION PHACO AND INTRAOCULAR LENS PLACEMENT ([facility]) RIGHT 4.10 00:41.6;  Surgeon: Eulogio Bear, MD;  Location: [facility];  Service: Ophthalmology;  Laterality: Right;   COLONOSCOPY WITH ESOPHAGOGASTRODUODENOSCOPY (EGD)     COLONOSCOPY WITH PROPOFOL N/A 11/01/2020   Procedure: COLONOSCOPY WITH PROPOFOL;  Surgeon: Lin Landsman, MD;  [facility] ENDOSCOPY;  Service:  Gastroenterology;  Laterality: N/A;   ESOPHAGOGASTRODUODENOSCOPY (EGD) WITH PROPOFOL N/A 11/01/2020   Procedure: ESOPHAGOGASTRODUODENOSCOPY (EGD) WITH PROPOFOL;  Surgeon: Lin Landsman, MD;  Location: [facility] ENDOSCOPY;  Service: Gastroenterology;  Laterality: N/A;   HERNIA REPAIR     inguinal and umbilical   History reviewed. No pertinent family history. Social History   Socioeconomic History   Marital status: Married    Spouse name: Not on file   Number of children: Not on file   Years of education: Not on file   Highest education level: Not on file  Occupational History   Not on file  Tobacco Use   Smoking status: Never   Smokeless tobacco: Never  Substance and Sexual Activity   Alcohol use: Yes    Alcohol/week: 0.0 standard drinks    Comment: rarely   Drug use: Not Currently   Sexual activity: Not on file  Other Topics Concern   Not on file  Social History Narrative   Not on file   Social Determinants of Health   Financial Resource Strain: Low Risk    Difficulty of Paying Living Expenses: Not hard at all  Food Insecurity: No Food Insecurity   Worried About Charity fundraiser in the Last Year: Never true   [place] in the Last Year: Never true  Transportation Needs: No Transportation Needs   Lack of Transportation (Medical): No   Lack of Transportation (Non-Medical): No  Physical Activity: Inactive   Days of Exercise per Week: 0 days   Minutes of Exercise per Session: 0 min  Stress: No Stress Concern Present   Feeling of Stress : Not at all  Social Connections: Unknown   Frequency of Communication with Friends and Family: Twice a week   Frequency of Social Gatherings with Friends and Family: Never   Attends Religious Services: Patient refused   Printmaker: Patient refused   Attends Music therapist: Patient refused   Marital Status: Married    Tobacco Counseling Counseling given: Not Answered   Clinical  Intake:  Pre-visit preparation completed: Yes  Pain : 0-10 Pain Score: 4  (when it flares up to 7 to 8) Pain Type: Chronic pain Pain Location: Abdomen Pain Descriptors / Indicators: Aching Pain Onset: More than a month ago Pain Frequency: Constant Pain Relieving Factors: Tramadol and Tylenol  Pain Relieving Factors: Tramadol and Tylenol  Diabetes: No  How often do you need to have someone help you when you read instructions, pamphlets, or other written materials from your doctor or pharmacy?: (P) 1 - Never What is the last grade level you completed in school?: Masters Degree  Diabetic? No  Interpreter Needed?: No  Information entered by :: Anson [place] CMA   Activities of Daily Living In your present state of health, do you have any difficulty performing the following activities: 06/19/2021 10/05/2020  Hearing? N Y  Vision? N N  Difficulty concentrating or making decisions? N N  Walking or climbing stairs? Y Y  Dressing or bathing? N N  Doing errands, shopping? Y N  Preparing Food and eating ? N -  Using the Toilet? N -  In the past six months, have you accidently leaked urine? Y -  Do you have problems with loss of bowel control? Y -  Managing your Medications? N -  Managing your Finances? N -  Housekeeping or managing your Housekeeping? N -  Some recent data might be hidden    Patient Care Team: [name], MD as PCP - General (Internal Medicine)  Indicate any recent Medical Services you may have received from other than [facility] providers in the past year (date may be approximate).     Assessment:   This is a routine wellness examination for [place].  Hearing/Vision screen No results found.  Dietary issues and exercise activities discussed: Current Exercise Habits: Home exercise routine, Type of exercise: walking;stretching, Time (Minutes): 30, Frequency (Times/Week): 7, Weekly Exercise (Minutes/Week): 210, Intensity: Mild   Goals Addressed   None     Depression Screen PHQ 2/9 Scores 06/21/2021  PHQ - 2 Score 0    Fall Risk Fall Risk  06/19/2021  Falls in the past year? 1  Number falls in past yr: 1  Injury with Fall? 0  Risk for fall due to : History of fall(s)  Follow up Falls evaluation completed    [place]:  Any stairs in or around the home? Yes  If so, are there any without handrails? No  Home free of loose throw rugs in walkways, pet beds, electrical cords, etc? Yes  Adequate lighting in your home to reduce risk of falls? Yes   ASSISTIVE DEVICES UTILIZED TO PREVENT FALLS:  Life alert? No  Use of a cane, walker or w/c? Yes  Grab bars in the bathroom? Yes  Shower chair or bench in shower? Yes  Elevated toilet seat or a handicapped toilet? Yes   TIMED UP AND GO:  Was the test performed? No .  Length of time to ambulate 10 feet: 0 sec.    Cognitive Function:     6CIT Screen 06/21/2021  What Year? 0 points  What month? 0 points  What time? 0 points  Count back from 20 0 points  Months in reverse 0 points  Repeat phrase 0 points  Total Score 0    Immunizations Immunization History  Administered Date(s) Administered   Influenza, High Dose Seasonal PF 09/01/2019   Influenza-Unspecified 02/21/2018, 04/11/2020, 03/04/2021   PFIZER(Purple Top)SARS-COV-2 Vaccination 08/19/2019, 09/13/2019, 04/11/2020, 09/27/2020   Pfizer Covid-19 Vaccine Bivalent Booster 67yrs & up 03/04/2021   Pneumococcal-Unspecified 04/16/2020    TDAP status: Due, Education has been provided regarding the importance of this vaccine. Advised may receive this vaccine at local pharmacy or Health Dept. Aware to provide a copy of the vaccination record if obtained from local pharmacy or Health Dept. Verbalized acceptance and understanding.  Flu Vaccine status: Up to date  Pneumococcal vaccine status: Due, Education has been provided regarding the importance of this vaccine. Advised may receive this  vaccine at local pharmacy or Health Dept. Aware to provide a copy of the vaccination record if obtained from local pharmacy or Health Dept. Verbalized acceptance and understanding.   Covid-19 vaccine status: Completed vaccines  Qualifies for Shingles Vaccine? Yes   Zostavax completed No   Shingrix Completed?: No.    Education has been provided regarding the importance of this vaccine. Patient has been advised to call insurance company to determine out of pocket expense if they have not yet received this vaccine. Advised may also receive vaccine at local  pharmacy or Health Dept. Verbalized acceptance and understanding.  Screening Tests Health Maintenance  Topic Date Due   Hepatitis C Screening  Never done   TETANUS/TDAP  Never done   Zoster Vaccines- Shingrix (1 of 2) Never done   Pneumonia Vaccine 87+ Years old (1 - PCV) 05/01/2022 (Originally 05/28/1952)   COLONOSCOPY (Pts 45-69yrs Insurance coverage will need to be confirmed)  11/01/2021   INFLUENZA VACCINE  Completed   COVID-19 Vaccine  Completed   HPV VACCINES  Aged Out    Health Maintenance  Health Maintenance Due  Topic Date Due   Hepatitis C Screening  Never done   TETANUS/TDAP  Never done   Zoster Vaccines- Shingrix (1 of 2) Never done    Colorectal cancer screening: Type of screening: Colonoscopy. Completed 2022. Repeat every 1 years  Lung Cancer Screening: (Low Dose CT Chest recommended if Age 81-80 years, 30 pack-year currently smoking OR have quit w/in 15years.) does not qualify.   Lung Cancer Screening Referral: No  Additional Screening:  Hepatitis C Screening: does not qualify; Completed No  Vision Screening: Recommended annual ophthalmology exams for early detection of glaucoma and other disorders of the eye. Is the patient up to date with their annual eye exam?  Yes  Who is the provider or what is the name of the office in which the patient attends annual eye exams? [facility] If pt is not  established with a provider, would they like to be referred to a provider to establish care? No .   Dental Screening: Recommended annual dental exams for proper oral hygiene  Community Resource Referral / Chronic Care Management: CRR required this visit?  No   CCM required this visit?  No      Plan:     I have personally reviewed and noted the following in the patients chart:   Medical and social history Use of alcohol, tobacco or illicit drugs  Current medications and supplements including opioid prescriptions. Patient is currently taking opioid prescriptions. Information provided to patient regarding non-opioid alternatives. Patient advised to discuss non-opioid treatment plan with their provider. Functional ability and status Nutritional status Physical activity Advanced directives List of other physicians Hospitalizations, surgeries, and ER visits in previous 12 months Vitals Screenings to include cognitive, depression, and falls Referrals and appointments  In addition, I have reviewed and discussed with patient certain preventive protocols, quality metrics, and best practice recommendations. A written personalized care plan for preventive services as well as general preventive health recommendations were provided to patient.    Mr. Gentile , Thank you for taking time to come for your Medicare Wellness Visit. I appreciate your ongoing commitment to your health goals. Please review the following plan we discussed and let me know if I can assist you in the future.   These are the goals we discussed:  Goals   None     This is a list of the screening recommended for you and due dates:  Health Maintenance  Topic Date Due   Hepatitis C Screening: USPSTF Recommendation to screen - Ages 58-79 yo.  Never done   Tetanus Vaccine  Never done   Zoster (Shingles) Vaccine (1 of 2) Never done   Pneumonia Vaccine (1 - PCV) 05/01/2022*   Colon Cancer Screening  11/01/2021   Flu Shot   Completed   COVID-19 Vaccine  Completed   HPV Vaccine  Aged Out  *Topic was postponed. The date shown is not the original due date.  Renato Gails, [place]   06/21/2021   Nurse Notes: Patient was informed of care gap vaccines. Mr. Fitzwater gets most of his vaccines from his local pharmacy, he plans on getting a copy of the vaccines that he has had so we can update this to his chart. The vaccines he has not received he plans to receive in our office in January. No other orders are needed at this time.

## 2021-07-04 ENCOUNTER — Ambulatory Visit (INDEPENDENT_AMBULATORY_CARE_PROVIDER_SITE_OTHER): Payer: Medicare Other | Admitting: Gastroenterology

## 2021-07-04 ENCOUNTER — Encounter: Payer: Self-pay | Admitting: Gastroenterology

## 2021-07-04 VITALS — BP 129/80 | HR 108 | Temp 97.8°F | Ht 73.0 in | Wt 355.4 lb

## 2021-07-04 DIAGNOSIS — R140 Abdominal distension (gaseous): Secondary | ICD-10-CM | POA: Diagnosis not present

## 2021-07-04 DIAGNOSIS — K8681 Exocrine pancreatic insufficiency: Secondary | ICD-10-CM

## 2021-07-04 MED ORDER — METRONIDAZOLE 500 MG PO TABS: 500.0000 mg | ORAL_TABLET | Freq: Two times a day (BID) | ORAL | 1 refills | Status: AC

## 2021-07-04 MED ORDER — CIPROFLOXACIN HCL 500 MG PO TABS: 500.0000 mg | ORAL_TABLET | Freq: Two times a day (BID) | ORAL | 1 refills | Status: AC

## 2021-07-04 NOTE — Progress Notes (Signed)
[name], MD [address]  [place]  [place], [place] [address]  Main: [phone]  Fax: [phone]    Gastroenterology Consultation  Referring Provider:     [name], MD Primary Care Physician:  [name], MD Primary Gastroenterologist:  Dr. [name] Reason for Consultation: Exocrine pancreatic insufficiency        HPI:   Ernest [name] is a 76 y.o. male referred by Dr. [name], MD  for consultation & management of upper abdominal pain.  Patient reports that since Christmas, he has been experiencing sporadic episodes of sharp upper abdominal pain, he points towards upper mid abdomen without any radiation.  Pain is triggered after heavy meal or fatty meal.  He feels like a gas bubble sitting in his upper abdomen associated with gurgling.  He has been taking Protonix 40 mg daily.  He has morbid obesity, obstructive sleep apnea, diastolic heart failure.  He is not on home oxygen.  He had ultrasound abdomen which revealed cholelithiasis.  His transaminases were normal as well as alkaline phosphatase.  He is noticed to have mild anemia, MCV normal.  Patient reports that he had upper endoscopy and colonoscopy by [facility] clinic GI more than 20 years ago  Follow-up visit 02/12/2021 Patient is here for follow-up of abdominal bloating associated with upper abdominal discomfort and loose stools.  Patient underwent extensive work-up, found to have severe exocrine pancreatic insufficiency.  I have started him on Creon 36K 3 capsules with each meal and 1-2 with snack.  Patient reports that his bloating, and upper abdominal discomfort seem to have improved on Creon.  He ran out of medication.  He does acknowledge eating a lot of cheese and dairy products daily.  He also underwent colonoscopy, found to have several polyps and one large polyp in the appendiceal orifice.  He was consulted by colorectal surgeon and patient decided to defer surgery at this time given  his extensive abdominal surgeries for ventral hernia and mesh placement.  Patient is currently not taking any iron supplements.  Follow-up visit 07/04/2021 Patient is here for follow-up of abdominal bloating.  Patient has met the colorectal surgeon, Dr. Dema Severin regarding recurrent partial small bowel obstructions and lysis of adhesions.  Patient had multiple and extensive abdominal surgeries which puts him at higher risk particularly given his overall medical condition.  Therefore, he was recommended dietary modification and conservative management.  Patient was empirically treated for bacterial overgrowth in the past which provided some relief.  He is also on pancreatic enzyme replacement therapy.  Patient has lost about 20 pounds since strict dietary modification.  Patient is here today with recurrence of abdominal bloating and is requesting Cipro and Flagyl.  Patient is reading a text book on intestinal adhesions and management  NSAIDs: Taking Excedrin for localized left frontal headache for the last 2 days every 6 hours  Antiplts/Anticoagulants/Anti thrombotics: None  GI Procedures: EGD and colonoscopy more than 20 years ago  EGD and colonoscopy 11/01/2020  - Normal duodenal bulb and second portion of the duodenum. Biopsied. - Normal stomach. Biopsied. - Esophagogastric landmarks identified. - Z-line irregular, 45 cm from the incisors. - Normal esophagus.  - One 10 mm polyp at the appendiceal orifice. Resection not attempted. - One 12 mm polyp in the cecum, removed with a hot snare. Resected and retrieved. - Two 5 mm polyps in the ascending colon and in the cecum, removed with a cold snare. Resected and retrieved. - Two  7 to 9 mm polyps in the transverse colon, removed with a hot snare. Resected and retrieved. - Four 4 to 5 mm polyps in the transverse colon, removed with a cold snare. Resected and retrieved. - Four 3 to 5 mm polyps in the rectum, removed with a cold snare. Resected and  retrieved. - The distal rectum and anal verge are normal on retroflexion view.  DIAGNOSIS:  A. DUODENUM; COLD BIOPSY:  - DUODENAL MUCOSA WITH NO SIGNIFICANT PATHOLOGIC ALTERATION.  - NEGATIVE FOR FEATURES OF CELIAC DISEASE.  - NEGATIVE FOR DYSPLASIA AND MALIGNANCY.   B. STOMACH, RANDOM; COLD BIOPSY:  - ANTRAL AND OXYNTIC MUCOSA WITH NO SIGNIFICANT PATHOLOGIC ALTERATION.  - NEGATIVE FOR ACTIVE INFLAMMATION AND H PYLORI.  - NEGATIVE FOR INTESTINAL METAPLASIA, DYSPLASIA, AND MALIGNANCY.   C. COLON POLYP X3, CECUM AND ASCENDING; HOT AND COLD SNARE:  - TUBULAR ADENOMA, MULTIPLE FRAGMENTS.  - NEGATIVE FOR HIGH-GRADE DYSPLASIA AND MALIGNANCY.   D. COLON POLYP X6, TRANSVERSE; HOT AND COLD SNARE:  - SESSILE SERRATED POLYP, NUMEROUS FRAGMENTS.  - TUBULAR ADENOMA, NUMEROUS FRAGMENTS.  - NEGATIVE FOR HIGH-GRADE DYSPLASIA AND MALIGNANCY.   E. RECTUM POLYP X4; COLD SNARE:  - SESSILE SERRATED POLYP, NUMEROUS FRAGMENTS.  - NEGATIVE FOR DYSPLASIA AND MALIGNANCY.  Past Medical History:  Diagnosis Date   Anemia    Chronic venous insufficiency of lower extremity    GERD (gastroesophageal reflux disease)    Sleep apnea    Small bowel obstruction ([place]) 2022    Past Surgical History:  Procedure Laterality Date   CATARACT EXTRACTION W/PHACO Left 08/06/2020   Procedure: CATARACT EXTRACTION PHACO AND INTRAOCULAR LENS PLACEMENT ([place]) LEFT;  Surgeon: Eulogio Bear, MD;  Location: [facility];  Service: Ophthalmology;  Laterality: Left;  3.41 0:31.5   CATARACT EXTRACTION W/PHACO Right 08/27/2020   Procedure: CATARACT EXTRACTION PHACO AND INTRAOCULAR LENS PLACEMENT ([facility]) RIGHT 4.10 00:41.6;  Surgeon: Eulogio Bear, MD;  Location: [facility];  Service: Ophthalmology;  Laterality: Right;   COLONOSCOPY WITH ESOPHAGOGASTRODUODENOSCOPY (EGD)     COLONOSCOPY WITH PROPOFOL N/A 11/01/2020   Procedure: COLONOSCOPY WITH PROPOFOL;  Surgeon: Lin Landsman, MD;  Location: [facility]  ENDOSCOPY;  Service: Gastroenterology;  Laterality: N/A;   ESOPHAGOGASTRODUODENOSCOPY (EGD) WITH PROPOFOL N/A 11/01/2020   Procedure: ESOPHAGOGASTRODUODENOSCOPY (EGD) WITH PROPOFOL;  Surgeon: Lin Landsman, MD;  Location: [facility] ENDOSCOPY;  Service: Gastroenterology;  Laterality: N/A;   HERNIA REPAIR     inguinal and umbilical    Current Outpatient Medications:    acetaminophen (TYLENOL) 325 MG tablet, Take 325 mg by mouth every 8 (eight) hours as needed., Disp: , Rfl:    amiloride-hydrochlorothiazide (MODURETIC) 5-50 MG tablet, Hold until followup with outpatient doctor due to your blood pressure being low normal., Disp: 30 tablet, Rfl: 6   ciprofloxacin (CIPRO) 500 MG tablet, Take 1 tablet (500 mg total) by mouth 2 (two) times daily for 14 days., Disp: 28 tablet, Rfl: 1   co-enzyme Q-10 30 MG capsule, Take 30 mg by mouth 3 (three) times daily., Disp: , Rfl:    fluticasone (FLONASE) 50 MCG/ACT nasal spray, Place 1 spray into the nose daily., Disp: , Rfl:    metroNIDAZOLE (FLAGYL) 500 MG tablet, Take 1 tablet (500 mg total) by mouth 2 (two) times daily for 14 days., Disp: 28 tablet, Rfl: 1   Multiple Vitamins-Minerals (CENTRUM SILVER ADULT 50+ PO), Take by mouth., Disp: , Rfl:    nystatin (MYCOSTATIN/NYSTOP) powder, Apply 1 application topically 3 (three) times daily., Disp: ,  Rfl:    pantoprazole (PROTONIX) 40 MG tablet, TAKE 1 TABLET BY MOUTH EVERY DAY, Disp: 90 tablet, Rfl: 3   Probiotic Product (PROBIOTIC DAILY PO), Take by mouth., Disp: , Rfl:    Turmeric 500 MG CAPS, Take 500 mg by mouth daily., Disp: , Rfl:   History reviewed. No pertinent family history.   Social History   Tobacco Use   Smoking status: Never   Smokeless tobacco: Never  Substance Use Topics   Alcohol use: Yes    Alcohol/week: 0.0 standard drinks    Comment: rarely   Drug use: Not Currently    Allergies as of 07/04/2021   (No Known Allergies)    Review of Systems:    All systems reviewed and negative  except where noted in HPI.   Physical Exam:  BP 129/80 (BP Location: Left Arm, Patient Position: Sitting, Cuff Size: Normal)    Pulse (!) 108    Temp 97.8 F (36.6 C) (Oral)    Ht 6' 1"  (1.854 m)    Wt (!) 355 lb 6 oz (161.2 kg)    BMI 46.89 kg/m  No LMP for male patient.  General:   Alert,  Well-developed, well-nourished, pleasant and cooperative in NAD Head:  Normocephalic and atraumatic. Eyes:  Sclera clear, no icterus.   Conjunctiva pink. Ears:  Normal auditory acuity. Nose:  No deformity, discharge, or lesions. Mouth:  No deformity or lesions,oropharynx pink & moist. Neck:  Supple; no masses or thyromegaly. Lungs:  Respirations even and unlabored.  Clear throughout to auscultation.   No wheezes, crackles, or rhonchi. No acute distress. Heart:  Regular rate and rhythm; no murmurs, clicks, rubs, or gallops. Abdomen:  Normal bowel sounds. Soft, non-tender and moderately distended, tympanic without masses, hepatosplenomegaly or hernias noted.  No guarding or rebound tenderness.   Rectal: Not performed Msk:  Symmetrical without gross deformities. Good, equal movement & strength bilaterally. Pulses:  Normal pulses noted. Extremities:  No clubbing or edema.  No cyanosis. Neurologic:  Alert and oriented x3;  grossly normal neurologically. Skin:  Intact without significant lesions or rashes. No jaundice. Psych:  Alert and cooperative. Normal mood and affect.  Imaging Studies: Reviewed  Assessment and Plan:   CONO GEBHARD [name] is a 76 y.o. male with morbid obesity, BMI 52 is seen in consultation for abdominal bloating associated with upper abdominal discomfort and loose stools, diagnosed with severe exocrine pancreatic insufficiency based on pancreatic fecal elastase levels  Abdominal bloating Patients with small bowel adhesions are at increased risk for bacterial overgrowth Patient responded with 2 weeks course of Cipro and Flagyl in the past Given him 2 weeks course of Cipro and  Flagyl 500 mg twice daily.  Discussed with patient regarding the side effects, risks and benefits of recurrent antibiotic use and he understands.  Severe EPI: Continue Creon 36K take 3 capsules with each meal 1-2 with snack Given that patient still has ongoing abdominal bloating, associated with epigastric discomfort discussed about lactose intolerance causing the symptoms.  Advised him to try strict lactose-free diet.    Appendiceal orifice polyp Patient has been consulted by colorectal surgeon and patient deferred to undergo surgical resection of the polyp at this time.  Patient understands that the polyp has potential to grow in next few years and turn into cancer.  He prefers to undergo close surveillance with colonoscopy.  Patient is due for surveillance colonoscopy in 10/2021  Iron deficiency anemia: Currently resolved EGD and colonoscopy did not reveal source of anemia  Follow up in 6 months   [name], MD

## 2021-07-09 DIAGNOSIS — J9601 Acute respiratory failure with hypoxia: Secondary | ICD-10-CM | POA: Diagnosis not present

## 2021-07-24 ENCOUNTER — Ambulatory Visit (INDEPENDENT_AMBULATORY_CARE_PROVIDER_SITE_OTHER): Payer: Medicare Other

## 2021-07-24 ENCOUNTER — Other Ambulatory Visit: Payer: Self-pay

## 2021-07-24 DIAGNOSIS — Z23 Encounter for immunization: Secondary | ICD-10-CM

## 2021-07-29 ENCOUNTER — Encounter: Payer: Self-pay | Admitting: Internal Medicine

## 2021-08-02 ENCOUNTER — Ambulatory Visit (INDEPENDENT_AMBULATORY_CARE_PROVIDER_SITE_OTHER): Payer: Medicare Other | Admitting: *Deleted

## 2021-08-02 ENCOUNTER — Other Ambulatory Visit: Payer: Self-pay

## 2021-08-02 DIAGNOSIS — U071 COVID-19: Secondary | ICD-10-CM | POA: Diagnosis not present

## 2021-08-02 LAB — POC COVID19 BINAXNOW: SARS Coronavirus 2 Ag: POSITIVE — AB

## 2021-08-06 ENCOUNTER — Other Ambulatory Visit: Payer: Self-pay | Admitting: Internal Medicine

## 2021-08-09 ENCOUNTER — Other Ambulatory Visit: Payer: Self-pay

## 2021-08-09 ENCOUNTER — Other Ambulatory Visit (INDEPENDENT_AMBULATORY_CARE_PROVIDER_SITE_OTHER): Payer: Medicare Other

## 2021-08-09 DIAGNOSIS — U071 COVID-19: Secondary | ICD-10-CM

## 2021-08-09 DIAGNOSIS — J9601 Acute respiratory failure with hypoxia: Secondary | ICD-10-CM | POA: Diagnosis not present

## 2021-08-09 LAB — POC COVID19 BINAXNOW: SARS Coronavirus 2 Ag: POSITIVE — AB

## 2021-08-15 ENCOUNTER — Ambulatory Visit (INDEPENDENT_AMBULATORY_CARE_PROVIDER_SITE_OTHER): Payer: Medicare Other | Admitting: *Deleted

## 2021-08-15 ENCOUNTER — Encounter: Payer: Self-pay | Admitting: Internal Medicine

## 2021-08-15 DIAGNOSIS — U071 COVID-19: Secondary | ICD-10-CM | POA: Diagnosis not present

## 2021-08-15 LAB — POC COVID19 BINAXNOW: SARS Coronavirus 2 Ag: NEGATIVE

## 2021-08-19 ENCOUNTER — Encounter: Payer: Self-pay | Admitting: Internal Medicine

## 2021-08-19 ENCOUNTER — Other Ambulatory Visit: Payer: Self-pay | Admitting: *Deleted

## 2021-08-19 MED ORDER — AMILORIDE-HYDROCHLOROTHIAZIDE 5-50 MG PO TABS: 1.0000 | ORAL_TABLET | Freq: Every day | ORAL | 3 refills | Status: DC

## 2021-09-06 DIAGNOSIS — J9601 Acute respiratory failure with hypoxia: Secondary | ICD-10-CM | POA: Diagnosis not present

## 2021-10-07 DIAGNOSIS — J9601 Acute respiratory failure with hypoxia: Secondary | ICD-10-CM | POA: Diagnosis not present

## 2021-10-10 DIAGNOSIS — H35722 Serous detachment of retinal pigment epithelium, left eye: Secondary | ICD-10-CM | POA: Diagnosis not present

## 2021-11-06 DIAGNOSIS — J9601 Acute respiratory failure with hypoxia: Secondary | ICD-10-CM | POA: Diagnosis not present

## 2021-12-07 DIAGNOSIS — J9601 Acute respiratory failure with hypoxia: Secondary | ICD-10-CM | POA: Diagnosis not present

## 2021-12-13 ENCOUNTER — Encounter: Payer: Self-pay | Admitting: Gastroenterology

## 2021-12-13 ENCOUNTER — Other Ambulatory Visit: Payer: Self-pay | Admitting: Gastroenterology

## 2021-12-13 DIAGNOSIS — R140 Abdominal distension (gaseous): Secondary | ICD-10-CM

## 2021-12-13 MED ORDER — METRONIDAZOLE 500 MG PO TABS: 500.0000 mg | ORAL_TABLET | Freq: Two times a day (BID) | ORAL | 0 refills | Status: AC

## 2021-12-13 MED ORDER — CIPROFLOXACIN HCL 500 MG PO TABS: 500.0000 mg | ORAL_TABLET | Freq: Two times a day (BID) | ORAL | 0 refills | Status: AC

## 2021-12-30 IMAGING — CT CT NECK W/ CM
4 of 5 series · 14 of 33 positions shown, 16 images · IV contrast (omnipaque)
Comparison: None.

CLINICAL DATA: Lump or mass inferior to the right ear noted over
the last month. Has gotten slightly smaller with antibiotics but
does persist.

EXAM:
CT NECK WITH CONTRAST
TECHNIQUE: Multidetector CT imaging of the neck was performed using the
standard protocol following the bolus administration of intravenous
contrast.
CONTRAST:  75mL OMNIPAQUE IOHEXOL 300 MG/ML  SOLN

[Series 2: axial neck · axial · 0.66mm/px · z∈[-232,-100]mm · 3 of 133 slices shown]
[im 34/133  bone]
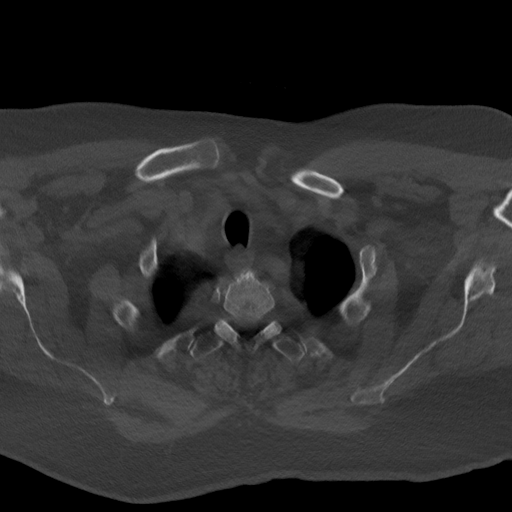
[im 67/133  bone]
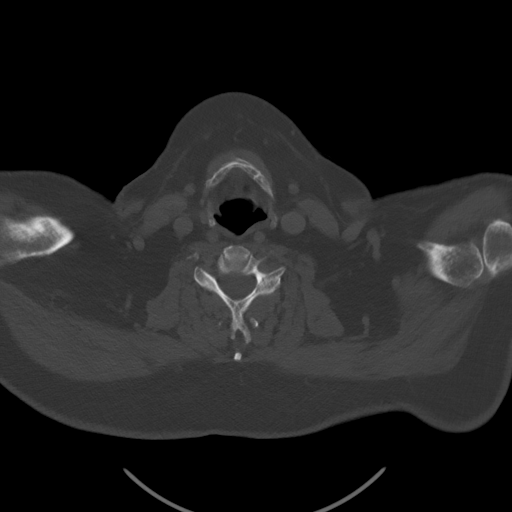
[im 100/133  bone]
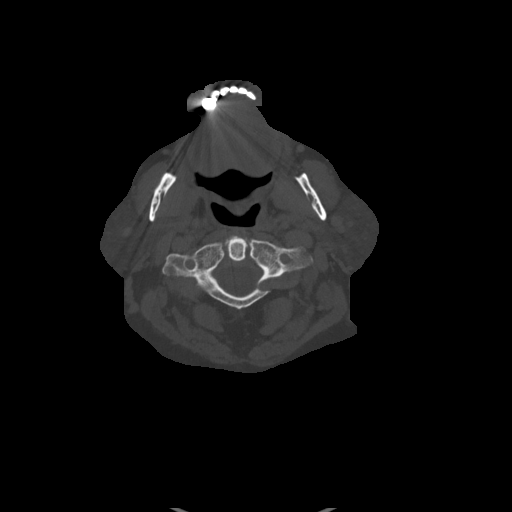

[Series 5: sag neck · sagittal · 0.63mm/px · 5 of 113 slices shown, 6 images]
[im 38/113  bone]
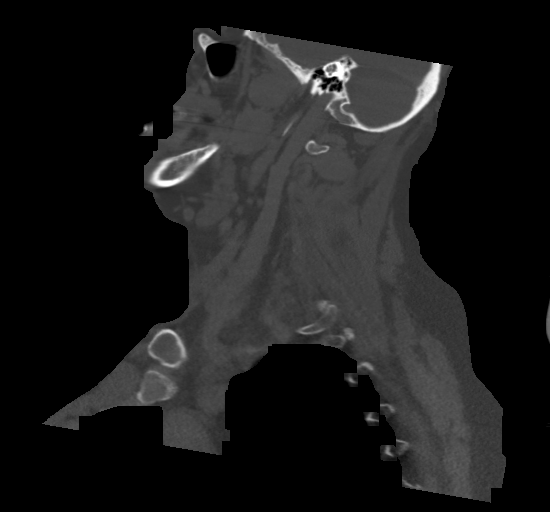
[im 47/113  bone]
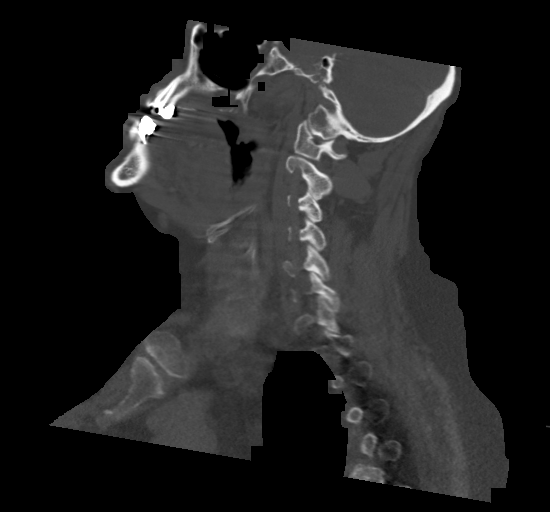
[im 57/113  soft-tissue]
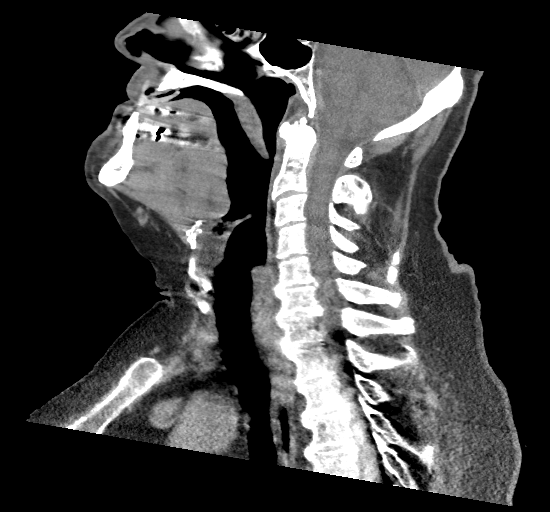
[im 57/113  bone]
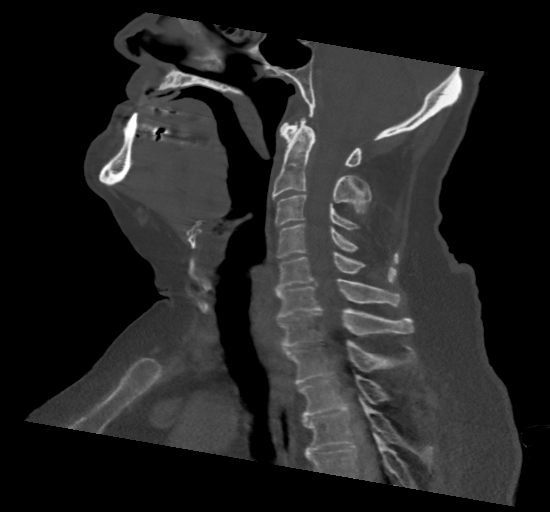
[im 66/113  bone]
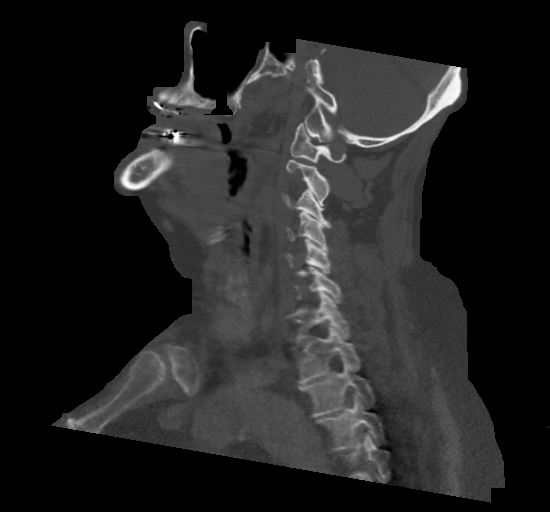
[im 75/113  bone]
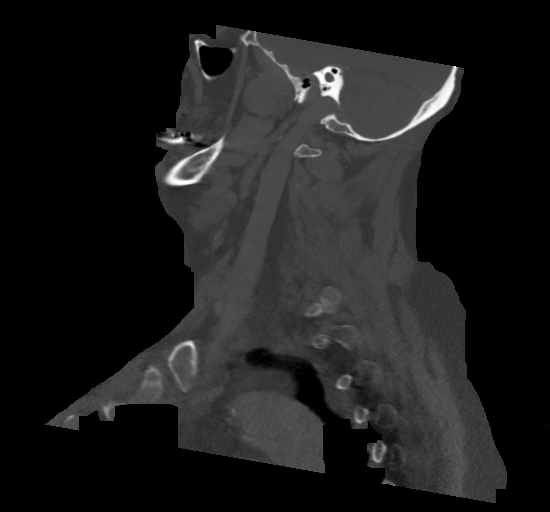

[Series 6: cor neck · coronal · 0.44mm/px · 3 of 140 slices shown]
[im 28/140  bone]
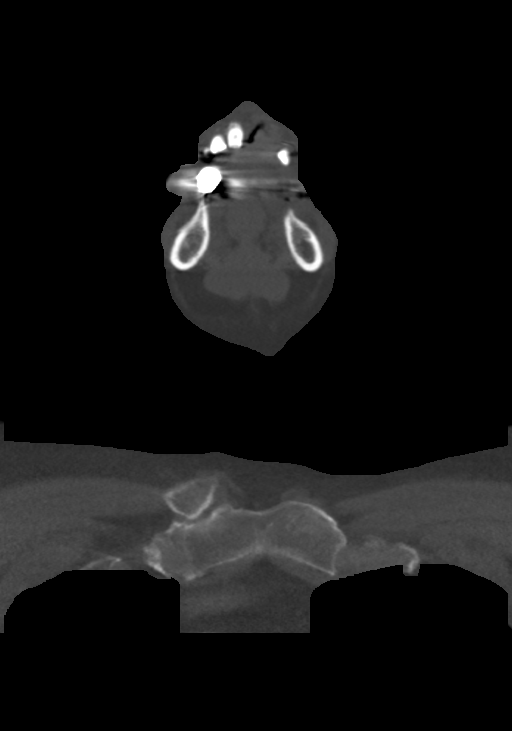
[im 56/140  bone]
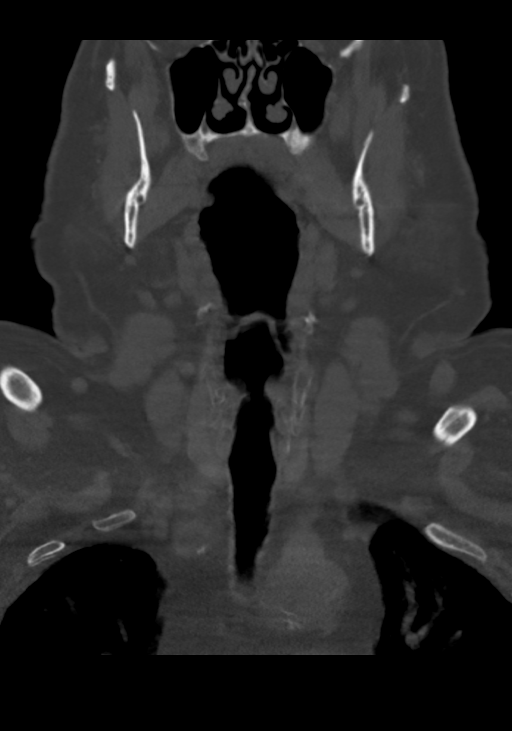
[im 84/140  bone]
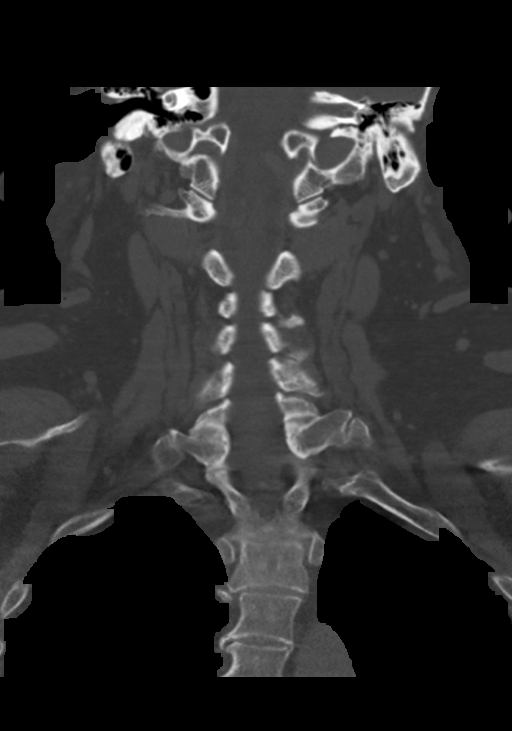

[Series 7: orthogonal ax · axial · 0.47mm/px · z∈[-276,-118]mm · 3 of 160 slices shown, 4 images]
[im 40/160  soft-tissue]
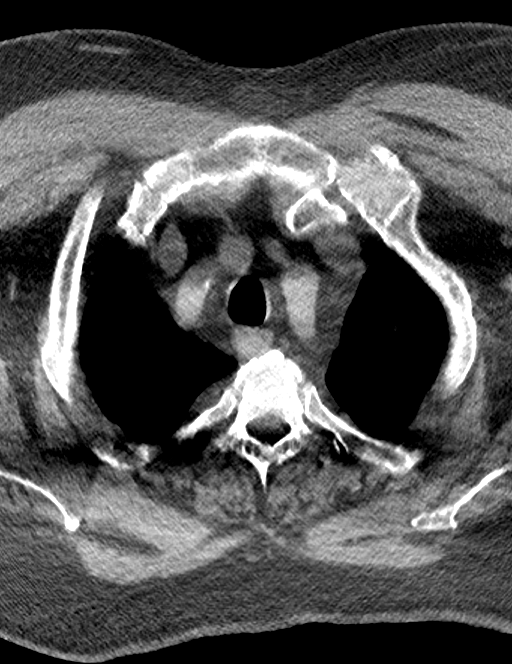
[im 40/160  bone]
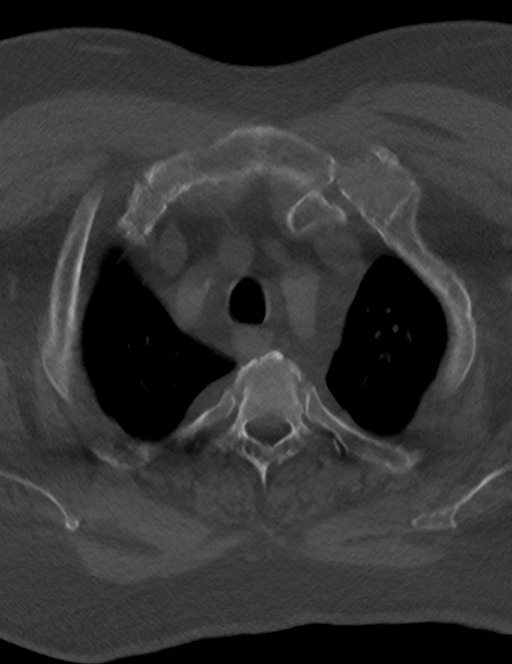
[im 80/160  bone]
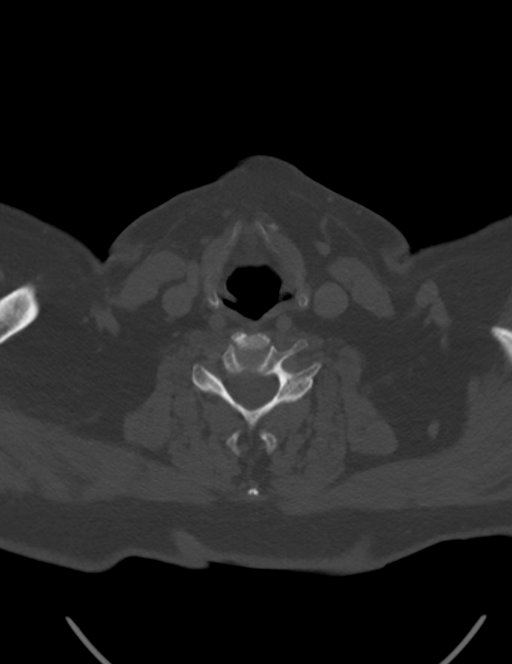
[im 120/160  bone]
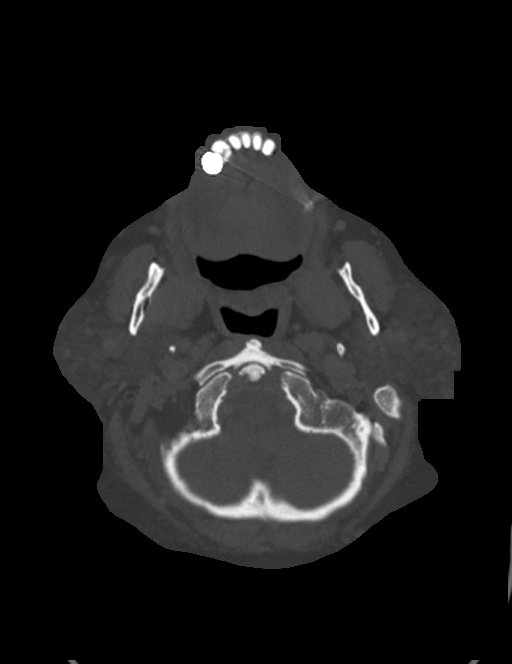

[14 of 33 positions shown; findings below may reference images not displayed]

FINDINGS: Pharynx and larynx: No mucosal or submucosal lesion.

Salivary glands: Submandibular glands are normal. Both parotid
glands appear prominent and somewhat low density. This can be normal
or can be seen in Sjogren syndrome. On the left, there are a few
small intraparotid lymph nodes and 2 tiny parotid stones. On the
right, there are small parotid lymph nodes, more numerous and
slightly larger at the caudal tip of the superficial lobe,
correlating with the clinical palpable abnormality. The parotid
tissue adjacent to those nodes is slightly more dense than the rest
of the gland. Findings are most consistent with mild inflammatory
change and mild reactive intraparotid lymphadenopathy. Mass lesion
is unlikely. No evidence of stone disease.

Thyroid: Normal

Lymph nodes: Normal aside from the right parotid gland.  See above.

Vascular: Mild atherosclerotic. No significant change at the carotid
bifurcations vascular finding.

Limited intracranial: Normal

Visualized orbits: Normal

Mastoids and visualized paranasal sinuses: Clear

Skeleton: Ordinary cervical spondylosis.

Upper chest: Normal except for aortic atherosclerotic calcification.

Other: None
IMPRESSION: The parotid glands appear prominent and somewhat low density. This
can be normal or can be seen in Sjogren syndrome. There are a few
small intraparotid lymph nodes and 2 tiny parotid stones on the
left. On the right, there are small parotid lymph nodes, more
numerous and slightly larger at the caudal tip of the superficial
lobe, correlating with the clinical palpable abnormality. The
parotid tissue adjacent to those nodes is slightly more dense than
the rest of the gland. Findings are most consistent with mild
inflammatory change and mild reactive intraparotid lymphadenopathy.
Mass lesion is unlikely. No evidence of stone disease. Continued
clinical follow-up to resolution is probably prudent.

Aortic Atherosclerosis ([id number]).

## 2022-01-06 DIAGNOSIS — J9601 Acute respiratory failure with hypoxia: Secondary | ICD-10-CM | POA: Diagnosis not present

## 2022-01-17 ENCOUNTER — Encounter: Payer: Self-pay | Admitting: Internal Medicine

## 2022-01-20 ENCOUNTER — Other Ambulatory Visit: Payer: Self-pay | Admitting: *Deleted

## 2022-01-20 MED ORDER — PANTOPRAZOLE SODIUM 40 MG PO TBEC: 40.0000 mg | DELAYED_RELEASE_TABLET | Freq: Every day | ORAL | 3 refills | Status: DC

## 2022-01-23 ENCOUNTER — Other Ambulatory Visit: Payer: Self-pay | Admitting: Internal Medicine

## 2022-02-06 DIAGNOSIS — J9601 Acute respiratory failure with hypoxia: Secondary | ICD-10-CM | POA: Diagnosis not present

## 2022-02-18 ENCOUNTER — Encounter: Payer: Self-pay | Admitting: Gastroenterology

## 2022-02-19 NOTE — Telephone Encounter (Signed)
Is patient due for colonoscopy. Last colonoscopy was 11/01/2020.Colonoscopy report says repeat in 3 years and path does not say anything about repeating. Health maintenance says he is due now

## 2022-02-20 ENCOUNTER — Other Ambulatory Visit: Payer: Self-pay

## 2022-02-20 DIAGNOSIS — Z86010 Personal history of colonic polyps: Secondary | ICD-10-CM

## 2022-02-20 MED ORDER — NA SULFATE-K SULFATE-MG SULF 17.5-3.13-1.6 GM/177ML PO SOLN
354.0000 mL | Freq: Once | ORAL | 0 refills | Status: AC
Start: 2022-02-20 — End: 2022-02-20

## 2022-02-23 ENCOUNTER — Encounter: Payer: Self-pay | Admitting: Internal Medicine

## 2022-02-24 ENCOUNTER — Encounter: Payer: Self-pay | Admitting: Internal Medicine

## 2022-02-25 ENCOUNTER — Other Ambulatory Visit: Payer: Self-pay | Admitting: *Deleted

## 2022-02-25 ENCOUNTER — Encounter: Payer: Self-pay | Admitting: Internal Medicine

## 2022-02-25 MED ORDER — TRAMADOL HCL 50 MG PO TABS: 50.0000 mg | ORAL_TABLET | Freq: Three times a day (TID) | ORAL | 0 refills | Status: AC | PRN

## 2022-02-25 MED ORDER — NYSTATIN 100000 UNIT/GM EX POWD: 1.0000 | Freq: Three times a day (TID) | CUTANEOUS | 6 refills | Status: DC

## 2022-02-25 MED ORDER — CLINDAMYCIN HCL 75 MG PO CAPS
75.0000 mg | ORAL_CAPSULE | Freq: Three times a day (TID) | ORAL | 0 refills | Status: DC
Start: 2022-02-25 — End: 2022-03-18

## 2022-03-04 ENCOUNTER — Encounter: Payer: Self-pay | Admitting: Internal Medicine

## 2022-03-09 DIAGNOSIS — J9601 Acute respiratory failure with hypoxia: Secondary | ICD-10-CM | POA: Diagnosis not present

## 2022-03-10 ENCOUNTER — Encounter: Payer: Self-pay | Admitting: Gastroenterology

## 2022-03-13 NOTE — Anesthesia Preprocedure Evaluation (Addendum)
Anesthesia Evaluation  Patient identified by MRN, date of birth, ID band Patient awake    Reviewed: Allergy & Precautions, H&P , NPO status , Patient's Chart, lab work & pertinent test results, reviewed documented beta blocker date and time   Airway Mallampati: II   Neck ROM: full    Dental  (+) Poor Dentition   Pulmonary sleep apnea, Continuous Positive Airway Pressure Ventilation and Oxygen sleep apnea ,   tachypnea   + decreased breath sounds(-) wheezing      Cardiovascular Exercise Tolerance: Poor +CHF (diastolic heart failure)   Rhythm:regular Rate:Normal + Peripheral Edema    Neuro/Psych negative neurological ROS  negative psych ROS   GI/Hepatic Neg liver ROS, GERD  Medicated,SIBO/pancreatic insufficiency   Endo/Other  Morbid obesity  Renal/GU negative Renal ROS  negative genitourinary   Musculoskeletal   Abdominal (+) + obese,   Peds  Hematology negative hematology ROS (+)   Anesthesia Other Findings Past Medical History: No date: Chronic venous insufficiency of lower extremity No date: GERD (gastroesophageal reflux disease) No date: Sleep apnea Past Surgical History: 08/06/2020: CATARACT EXTRACTION W/PHACO; Left     Comment:  Procedure: CATARACT EXTRACTION PHACO AND INTRAOCULAR               LENS PLACEMENT ([place]) LEFT;  Surgeon: Eulogio Bear,               MD;  Location: [facility];  Service:               Ophthalmology;  Laterality: Left;  3.41 0:31.5 08/27/2020: CATARACT EXTRACTION W/PHACO; Right     Comment:  Procedure: CATARACT EXTRACTION PHACO AND INTRAOCULAR               LENS PLACEMENT ([facility]) RIGHT 4.10 00:41.6;  Surgeon: Eulogio Bear, MD;  Location: [facility];                Service: Ophthalmology;  Laterality: Right; No date: COLONOSCOPY WITH ESOPHAGOGASTRODUODENOSCOPY (EGD) No date: HERNIA REPAIR     Comment:  inguinal and umbilical BMI    Body  Mass Index: 50.79 kg/m     Reproductive/Obstetrics negative OB ROS                            Anesthesia Physical  Anesthesia Plan  ASA: 3  Anesthesia Plan: General   Post-op Pain Management: Minimal or no pain anticipated   Induction: Intravenous  PONV Risk Score and Plan: TIVA and Propofol infusion  Airway Management Planned: Simple Face Mask  Additional Equipment:   Intra-op Plan:   Post-operative Plan:   Informed Consent: I have reviewed the patients History and Physical, chart, labs and discussed the procedure including the risks, benefits and alternatives for the proposed anesthesia with the patient or authorized representative who has indicated his/her understanding and acceptance.     Dental Advisory Given  Plan Discussed with: CRNA  Anesthesia Plan Comments:        Anesthesia Quick Evaluation

## 2022-03-18 ENCOUNTER — Ambulatory Visit: Payer: Medicare Other | Admitting: Anesthesiology

## 2022-03-18 ENCOUNTER — Ambulatory Visit
Admission: RE | Admit: 2022-03-18 | Discharge: 2022-03-18 | Disposition: A | Discharge status: Home / Self Care | Payer: Medicare Other | Attending: Gastroenterology | Admitting: Gastroenterology

## 2022-03-18 ENCOUNTER — Encounter
Admission: RE | Disposition: A | Discharge status: Home / Self Care | Payer: Self-pay | Source: Home / Self Care | Attending: Gastroenterology

## 2022-03-18 ENCOUNTER — Encounter: Payer: Self-pay | Admitting: Gastroenterology

## 2022-03-18 ENCOUNTER — Other Ambulatory Visit: Payer: Self-pay

## 2022-03-18 ENCOUNTER — Ambulatory Visit (AMBULATORY_SURGERY_CENTER): Payer: Medicare Other | Admitting: Anesthesiology

## 2022-03-18 DIAGNOSIS — I509 Heart failure, unspecified: Secondary | ICD-10-CM | POA: Diagnosis not present

## 2022-03-18 DIAGNOSIS — Z860100 Personal history of colon polyps, unspecified: Secondary | ICD-10-CM

## 2022-03-18 DIAGNOSIS — K219 Gastro-esophageal reflux disease without esophagitis: Secondary | ICD-10-CM | POA: Insufficient documentation

## 2022-03-18 DIAGNOSIS — D121 Benign neoplasm of appendix: Secondary | ICD-10-CM | POA: Insufficient documentation

## 2022-03-18 DIAGNOSIS — K635 Polyp of colon: Secondary | ICD-10-CM

## 2022-03-18 DIAGNOSIS — G4730 Sleep apnea, unspecified: Secondary | ICD-10-CM | POA: Diagnosis not present

## 2022-03-18 DIAGNOSIS — D120 Benign neoplasm of cecum: Secondary | ICD-10-CM

## 2022-03-18 DIAGNOSIS — I5030 Unspecified diastolic (congestive) heart failure: Secondary | ICD-10-CM

## 2022-03-18 DIAGNOSIS — Z6841 Body Mass Index (BMI) 40.0 and over, adult: Secondary | ICD-10-CM | POA: Diagnosis not present

## 2022-03-18 DIAGNOSIS — D123 Benign neoplasm of transverse colon: Secondary | ICD-10-CM | POA: Insufficient documentation

## 2022-03-18 DIAGNOSIS — G4733 Obstructive sleep apnea (adult) (pediatric): Secondary | ICD-10-CM

## 2022-03-18 DIAGNOSIS — D126 Benign neoplasm of colon, unspecified: Secondary | ICD-10-CM | POA: Diagnosis not present

## 2022-03-18 DIAGNOSIS — Z09 Encounter for follow-up examination after completed treatment for conditions other than malignant neoplasm: Secondary | ICD-10-CM | POA: Diagnosis present

## 2022-03-18 HISTORY — DX: Dyspnea, unspecified: R06.00

## 2022-03-18 HISTORY — DX: Dependence on other enabling machines and devices: Z99.89

## 2022-03-18 HISTORY — PX: COLONOSCOPY WITH PROPOFOL: SHX5780

## 2022-03-18 HISTORY — PX: POLYPECTOMY: SHX5525

## 2022-03-18 HISTORY — DX: Unspecified osteoarthritis, unspecified site: M19.90

## 2022-03-18 SURGERY — COLONOSCOPY WITH PROPOFOL
Anesthesia: General | Site: Rectum

## 2022-03-18 MED ORDER — LACTATED RINGERS IV SOLN: INTRAVENOUS | Status: DC

## 2022-03-18 MED ORDER — LIDOCAINE HCL (CARDIAC) PF 100 MG/5ML IV SOSY
PREFILLED_SYRINGE | INTRAVENOUS | Status: DC | PRN
  Administered 2022-03-18: 100 mg via INTRAVENOUS

## 2022-03-18 MED ORDER — SODIUM CHLORIDE 0.9 % IV SOLN: INTRAVENOUS | Status: DC

## 2022-03-18 MED ORDER — STERILE WATER FOR IRRIGATION IR SOLN
Status: DC | PRN
  Administered 2022-03-18: 100 mL

## 2022-03-18 MED ORDER — PROPOFOL 10 MG/ML IV BOLUS
INTRAVENOUS | Status: DC | PRN
  Administered 2022-03-18 (×8): 50 mg via INTRAVENOUS

## 2022-03-18 SURGICAL SUPPLY — 9 items
FORCEPS BIOP RAD 4 LRG CAP 4 (CUTTING FORCEPS) IMPLANT
GOWN CVR UNV OPN BCK APRN NK (MISCELLANEOUS) ×4 IMPLANT
GOWN ISOL THUMB LOOP REG UNIV (MISCELLANEOUS) ×4
KIT PRC NS LF DISP ENDO (KITS) ×2 IMPLANT
KIT PROCEDURE OLYMPUS (KITS) ×2
MANIFOLD NEPTUNE II (INSTRUMENTS) ×2 IMPLANT
SNARE COLD EXACTO (MISCELLANEOUS) IMPLANT
TRAP ETRAP POLY (MISCELLANEOUS) IMPLANT
WATER STERILE IRR 250ML POUR (IV SOLUTION) ×2 IMPLANT

## 2022-03-18 NOTE — Anesthesia Postprocedure Evaluation (Signed)
Anesthesia Post Note  Patient: Ernest [name]  Procedure(s) Performed: COLONOSCOPY WITH BIOPSIES (Rectum) POLYPECTOMY (Rectum)     Patient location during evaluation: PACU Anesthesia Type: General Level of consciousness: awake and alert Pain management: pain level controlled Vital Signs Assessment: post-procedure vital signs reviewed and stable Respiratory status: spontaneous breathing, nonlabored ventilation and respiratory function stable Cardiovascular status: blood pressure returned to baseline and stable Postop Assessment: no apparent nausea or vomiting Anesthetic complications: no   There were no known notable events for this encounter.  [place] Ouch

## 2022-03-18 NOTE — Transfer of Care (Signed)
Immediate Anesthesia Transfer of Care Note  Patient: Ernest [name]  Procedure(s) Performed: COLONOSCOPY WITH BIOPSIES (Rectum) POLYPECTOMY (Rectum)  Patient Location: PACU  Anesthesia Type: General  Level of Consciousness: awake, alert  and patient cooperative  Airway and Oxygen Therapy: Patient Spontanous Breathing and Patient connected to supplemental oxygen  Post-op Assessment: Post-op Vital signs reviewed, Patient's Cardiovascular Status Stable, Respiratory Function Stable, Patent Airway and No signs of Nausea or vomiting  Post-op Vital Signs: Reviewed and stable  Complications: There were no known notable events for this encounter.

## 2022-03-18 NOTE — Op Note (Signed)
[facility] Patient Name: Ernest [name] Procedure Date: 03/18/2022 8:55 AM MRN: [id number] Account #: [id number] Date of Birth: 1946-05-10 Admit Type: Outpatient Age: 76 Room: [facility] OR ROOM 01 Gender: Male Note Status: Finalized Instrument Name: [id number] Procedure:             Colonoscopy Indications:           Surveillance: High risk for colon cancer and History                         of adenomatous polyps, last colonoscopy (<3 yr) Providers:             Lin Landsman MD, MD Referring MD:          [name], MD (Referring MD) Medicines:             General Anesthesia Complications:         No immediate complications. Estimated blood loss:                         Minimal. Procedure:             Pre-Anesthesia Assessment:                        - Prior to the procedure, a History and Physical was                         performed, and patient medications and allergies were                         reviewed. The patient is competent. The risks and                         benefits of the procedure and the sedation options and                         risks were discussed with the patient. All questions                         were answered and informed consent was obtained.                         Patient identification and proposed procedure were                         verified by the physician, the nurse, the                         anesthesiologist, the anesthetist and the technician                         in the pre-procedure area in the procedure room in the                         endoscopy suite. Mental Status Examination: alert and                         oriented. Airway Examination: normal oropharyngeal  airway and neck mobility. Respiratory Examination:                         clear to auscultation. CV Examination: normal.                         Prophylactic Antibiotics: The patient does not require                          prophylactic antibiotics. Prior Anticoagulants: The                         patient has taken no previous anticoagulant or                         antiplatelet agents. ASA Grade Assessment: III - A                         patient with severe systemic disease. After reviewing                         the risks and benefits, the patient was deemed in                         satisfactory condition to undergo the procedure. The                         anesthesia plan was to use general anesthesia.                         Immediately prior to administration of medications,                         the patient was re-assessed for adequacy to receive                         sedatives. The heart rate, respiratory rate, oxygen                         saturations, blood pressure, adequacy of pulmonary                         ventilation, and response to care were monitored                         throughout the procedure. The physical status of the                         patient was re-assessed after the procedure.                        After obtaining informed consent, the colonoscope was                         passed under direct vision. Throughout the procedure,                         the patient's blood pressure, pulse, and oxygen  saturations were monitored continuously. The                         Colonoscope was introduced through the anus and                         advanced to the the cecum, identified by appendiceal                         orifice and ileocecal valve. The colonoscopy was                         performed without difficulty. The patient tolerated                         the procedure well. The quality of the bowel                         preparation was good. Findings:      The perianal and digital rectal examinations were normal. Pertinent       negatives include normal sphincter tone and no palpable rectal lesions.      A medium  polyp was found in the appendiceal orifice. The polyp was       sessile and large, friable cncerning for malignancy, increased size       compared to prevous colonoscopy in 2022. Polypectomy was not attempted       due to polyp size (too large to be excised) and location. Biopsies were       taken with a cold forceps for histology. Estimated blood loss was       minimal.      Two sessile polyps were found in the transverse colon. The polyps were 4       to 5 mm in size. These polyps were removed with a cold snare. Resection       and retrieval were complete. Estimated blood loss: none.      The retroflexed view of the distal rectum and anal verge was normal and       showed no anal or rectal abnormalities. Impression:            - One medium polyp at the appendiceal orifice.                         Resection not attempted. Biopsied.                        - Two 4 to 5 mm polyps in the transverse colon,                         removed with a cold snare. Resected and retrieved.                        - The distal rectum and anal verge are normal on                         retroflexion view. Recommendation:        - Discharge patient to home (with escort).                        -  Cardiac diet today.                        - Continue present medications.                        - Await pathology results.                        - Refer to a colo-rectal surgeon at appointment to be                         scheduled.                        - Repeat colonoscopy in 5 years for surveillance based                         on pathology results. Procedure Code(s):     --- Professional ---                        [phone], Colonoscopy, flexible; with removal of                         tumor(s), polyp(s), or other lesion(s) by snare                         technique                        45380, 7, Colonoscopy, flexible; with biopsy, single                         or multiple Diagnosis Code(s):     ---  Professional ---                        K63.5, Polyp of colon                        Z86.010, Personal history of colonic polyps CPT copyright 2019 American Medical Association. All rights reserved. The codes documented in this report are preliminary and upon coder review may  be revised to meet current compliance requirements. Dr. Ulyess Mort Lin Landsman MD, MD 03/18/2022 9:36:52 AM This report has been signed electronically. Number of Addenda: 0 Note Initiated On: 03/18/2022 8:55 AM Scope Withdrawal Time: 0 hours 13 minutes 57 seconds  Total Procedure Duration: 0 hours 18 minutes 51 seconds  Estimated Blood Loss:  Estimated blood loss was minimal. Estimated blood loss                         was minimal.      [facility]

## 2022-03-18 NOTE — H&P (Signed)
[name], MD [address]  [place]  [place], [place] [address]  Main: [phone]  Fax: [phone] Pager: [phone]  Primary Care Physician:  [name], MD Primary Gastroenterologist:  Dr. [name]  Pre-Procedure History & Physical: HPI:  Ernest [name] is a 76 y.o. male is here for an colonoscopy.   Past Medical History:  Diagnosis Date   Anemia    Arthritis    osteoarthritis - bilateral knees   Chronic venous insufficiency of lower extremity    Dyspnea    GERD (gastroesophageal reflux disease)    Sleep apnea    Small bowel obstruction ([place]) 2022   Uses roller walker     Past Surgical History:  Procedure Laterality Date   CATARACT EXTRACTION W/PHACO Left 08/06/2020   Procedure: CATARACT EXTRACTION PHACO AND INTRAOCULAR LENS PLACEMENT ([place]) LEFT;  Surgeon: Eulogio Bear, MD;  Location: [place];  Service: Ophthalmology;  Laterality: Left;  3.41 0:31.5   CATARACT EXTRACTION W/PHACO Right 08/27/2020   Procedure: CATARACT EXTRACTION PHACO AND INTRAOCULAR LENS PLACEMENT (IOC) RIGHT 4.10 00:41.6;  Surgeon: Eulogio Bear, MD;  Location: [place];  Service: Ophthalmology;  Laterality: Right;   COLONOSCOPY WITH ESOPHAGOGASTRODUODENOSCOPY (EGD)     COLONOSCOPY WITH PROPOFOL N/A 11/01/2020   Procedure: COLONOSCOPY WITH PROPOFOL;  Surgeon: Lin Landsman, MD;  Location: [facility] ENDOSCOPY;  Service: Gastroenterology;  Laterality: N/A;   ESOPHAGOGASTRODUODENOSCOPY (EGD) WITH PROPOFOL N/A 11/01/2020   Procedure: ESOPHAGOGASTRODUODENOSCOPY (EGD) WITH PROPOFOL;  Surgeon: Lin Landsman, MD;  Location[facility] END[facility]COPY;  Service: Gastroenterology;  Laterality: N/A;   HERNIA REPAIR     inguinal and umbilical    Prior to Admission medications   Medication Sig Start Date End Date Taking? Authorizing Provider  acetaminophen (TYLENOL) 325 MG tablet Take 325 mg by mouth every 8 (eight) hours as needed. 09/01/19  Yes [provider]  amiloride-hydrochlorothiazide (MODURETIC) 5-50 MG tablet Take 1 tablet by mouth daily. 08/19/21  Yes Masoud, Viann Shove, MD  Multiple Vitamins-Minerals (CENTRUM SILVER ADULT 50+ PO) Take by mouth.   Yes [provider]  nystatin (MYCOSTATIN/NYSTOP) powder Apply 1 Application topically 3 (three) times daily. 02/25/22  Yes Masoud, Viann Shove, MD  pantoprazole (PROTONIX) 40 MG tablet Take 1 tablet (40 mg total) by mouth daily. 01/20/22  Yes [name], MD  Probiotic Product (PROBIOTIC DAILY PO) Take by mouth.   Yes [provider]  clindamycin (CLEOCIN) 75 MG capsule Take 1 capsule (75 mg total) by mouth 3 (three) times daily. Patient not taking: Reported on 03/10/2022 02/25/22   [name], MD  co-enzyme Q-10 30 MG capsule Take 30 mg by mouth 3 (three) times daily. Patient not taking: Reported on 03/10/2022    [provider]  fluticasone (FLONASE) 50 MCG/ACT nasal spray Place 1 spray into the nose daily. Patient not taking: Reported on 03/10/2022    [provider]  Turmeric 500 MG CAPS Take 500 mg by mouth daily. Patient not taking: Reported on 03/10/2022    [provider]    Allergies as of 02/20/2022   (No Known Allergies)    History reviewed. No pertinent family history.  Social History   Socioeconomic History   Marital status: Married    Spouse name: Not on file   Number of children: Not on file   Years of education: Not on file   Highest education level: Not on file  Occupational History   Not on file  Tobacco Use  Smoking status: Never   Smokeless tobacco: Never  Substance and Sexual Activity   Alcohol use: Yes    Alcohol/week: 0.0 standard drinks of alcohol    Comment: rarely   Drug use: Not Currently   Sexual activity: Not on file  Other Topics Concern   Not on file  Social History Narrative   Not on file   Social Determinants of Health   Financial Resource Strain: Low Risk  (06/21/2021)   Overall Financial  Resource Strain (CARDIA)    Difficulty of Paying Living Expenses: Not hard at all  Food Insecurity: No Food Insecurity (06/21/2021)   Hunger Vital Sign    Worried About Running Out of Food in the Last Year: Never true    Ran Out of Food in the Last Year: Never true  Transportation Needs: No Transportation Needs (06/21/2021)   PRAPARE - Hydrologist (Medical): No    Lack of Transportation (Non-Medical): No  Physical Activity: Inactive (06/21/2021)   Exercise Vital Sign    Days of Exercise per Week: 0 days    Minutes of Exercise per Session: 0 min  Stress: No Stress Concern Present (06/21/2021)   [place]    Feeling of Stress : Not at all  Social Connections: Unknown (06/21/2021)   Social Connection and Isolation Panel [NHANES]    Frequency of Communication with Friends and Family: Twice a week    Frequency of Social Gatherings with Friends and Family: Never    Attends Religious Services: Patient refused    Marine scientist or Organizations: Patient refused    Attends Archivist Meetings: Patient refused    Marital Status: Married  Human resources officer Violence: Not At Risk (06/21/2021)   Humiliation, Afraid, Rape, and Kick questionnaire    Fear of Current or Ex-Partner: No    Emotionally Abused: No    Physically Abused: No    Sexually Abused: No    Review of Systems: See HPI, otherwise negative ROS  Physical Exam: BP 108/86   Pulse (!) 111   Temp 97.7 F (36.5 C) (Temporal)   Ht '6\' 1"'$  (1.854 m)   Wt (!) 151.5 kg   SpO2 95%   BMI 44.07 kg/m  General:   Alert,  pleasant and cooperative in NAD Head:  Normocephalic and atraumatic. Neck:  Supple; no masses or thyromegaly. Lungs:  Clear throughout to auscultation.    Heart:  Regular rate and rhythm. Abdomen:  Soft, nontender and nondistended. Normal bowel sounds, without guarding, and without rebound.   Neurologic:   Alert and  oriented x4;  grossly normal neurologically.  Impression/Plan: Ernest [name] is here for an colonoscopy to be performed for follow up of unresected appendix polyp  Risks, benefits, limitations, and alternatives regarding  colonoscopy have been reviewed with the patient.  Questions have been answered.  All parties agreeable.   Sherri Sear, MD  03/18/2022, 8:56 AM

## 2022-03-19 ENCOUNTER — Encounter: Payer: Self-pay | Admitting: Gastroenterology

## 2022-03-20 ENCOUNTER — Encounter: Payer: Self-pay | Admitting: Gastroenterology

## 2022-03-20 ENCOUNTER — Encounter: Payer: Self-pay | Admitting: Internal Medicine

## 2022-03-20 LAB — SURGICAL PATHOLOGY

## 2022-03-24 ENCOUNTER — Encounter: Payer: Self-pay | Admitting: Internal Medicine

## 2022-03-31 ENCOUNTER — Encounter: Payer: Self-pay | Admitting: Gastroenterology

## 2022-03-31 DIAGNOSIS — Z8719 Personal history of other diseases of the digestive system: Secondary | ICD-10-CM

## 2022-04-02 NOTE — Telephone Encounter (Signed)
Order CT scan for history of bowel obstruction. Called Central scheduling and got patient schedule for [facility]. Nothing to eat or drink after midnight. Please arrive at 7:45am or a 8:00am scan

## 2022-04-03 ENCOUNTER — Other Ambulatory Visit: Payer: Self-pay | Admitting: *Deleted

## 2022-04-03 ENCOUNTER — Encounter: Payer: Self-pay | Admitting: Internal Medicine

## 2022-04-03 MED ORDER — CIPROFLOXACIN HCL 500 MG PO TABS: 500.0000 mg | ORAL_TABLET | Freq: Two times a day (BID) | ORAL | 0 refills | Status: AC

## 2022-04-07 ENCOUNTER — Encounter: Payer: Self-pay | Admitting: Internal Medicine

## 2022-04-08 ENCOUNTER — Other Ambulatory Visit: Payer: Self-pay | Admitting: *Deleted

## 2022-04-08 DIAGNOSIS — H35722 Serous detachment of retinal pigment epithelium, left eye: Secondary | ICD-10-CM | POA: Diagnosis not present

## 2022-04-08 DIAGNOSIS — J9601 Acute respiratory failure with hypoxia: Secondary | ICD-10-CM | POA: Diagnosis not present

## 2022-04-08 DIAGNOSIS — Z961 Presence of intraocular lens: Secondary | ICD-10-CM | POA: Diagnosis not present

## 2022-04-08 DIAGNOSIS — H43813 Vitreous degeneration, bilateral: Secondary | ICD-10-CM | POA: Diagnosis not present

## 2022-04-08 MED ORDER — AMILORIDE-HYDROCHLOROTHIAZIDE 5-50 MG PO TABS: 1.0000 | ORAL_TABLET | Freq: Every day | ORAL | 3 refills | Status: DC

## 2022-04-15 ENCOUNTER — Ambulatory Visit
Admission: RE | Admit: 2022-04-15 | Discharge: 2022-04-15 | Disposition: A | Discharge status: Home / Self Care | Payer: Medicare Other | Source: Ambulatory Visit | Attending: Gastroenterology | Admitting: Gastroenterology

## 2022-04-15 DIAGNOSIS — I700 Atherosclerosis of aorta: Secondary | ICD-10-CM | POA: Insufficient documentation

## 2022-04-15 DIAGNOSIS — K8020 Calculus of gallbladder without cholecystitis without obstruction: Secondary | ICD-10-CM | POA: Diagnosis not present

## 2022-04-15 DIAGNOSIS — R609 Edema, unspecified: Secondary | ICD-10-CM | POA: Insufficient documentation

## 2022-04-15 DIAGNOSIS — N200 Calculus of kidney: Secondary | ICD-10-CM | POA: Insufficient documentation

## 2022-04-15 DIAGNOSIS — Z8719 Personal history of other diseases of the digestive system: Secondary | ICD-10-CM | POA: Diagnosis not present

## 2022-04-15 LAB — POCT I-STAT CREATININE: Creatinine, Ser: 1.1 mg/dL (ref 0.61–1.24)

## 2022-04-15 MED ORDER — IOHEXOL 300 MG/ML  SOLN
100.0000 mL | Freq: Once | INTRAMUSCULAR | Status: AC | PRN
  Administered 2022-04-15: 100 mL via INTRAVENOUS

## 2022-04-15 MED ORDER — IOHEXOL 9 MG/ML PO SOLN
500.0000 mL | ORAL | Status: AC
  Administered 2022-04-15 (×2): 500 mL via ORAL

## 2022-04-16 ENCOUNTER — Telehealth: Payer: Self-pay

## 2022-04-16 ENCOUNTER — Encounter: Payer: Self-pay | Admitting: Gastroenterology

## 2022-04-16 NOTE — Telephone Encounter (Signed)
[facility] radiology is calling about the patient CT results. The results are 1. Dilated small bowel loop in the right abdomen with perienteric edema/inflammation. There is relatively abrupt tapering to nondilated small bowel suggesting the presence of a transition zone. Small bowel proximal to the most dilated segment is also distended measuring up to 5.5 cm diameter. Small bowel distal to the abnormal segments is decompressed into the terminal ileum. Imaging features are consistent with small bowel obstruction. Overall appearance of the findings today is very similar to the previous study from 04/01/2021. 2. 2.8 cm low-density lesion in the uncinate process of the pancreas is not substantially changed in the interval and reportedly stable back to 2021. There is no associated dilatation of the main pancreatic duct. Follow-up imaging in 12 months recommended to ensure stability. This recommendation follows ACR consensus guidelines: Management of Incidental Pancreatic Cysts: A White Paper of the ACR Incidental Findings Committee. [place] 7169;67:893-810. 3. Cholelithiasis. 4. 2 mm nonobstructing left renal stone. 5. Aortic Atherosclerosis (ICD10-I70.0).

## 2022-04-16 NOTE — Telephone Encounter (Signed)
Patient is aware and he already reviewed the results on MyChart  RV

## 2022-04-26 ENCOUNTER — Encounter: Payer: Self-pay | Admitting: Internal Medicine

## 2022-05-06 DIAGNOSIS — H43813 Vitreous degeneration, bilateral: Secondary | ICD-10-CM | POA: Diagnosis not present

## 2022-05-06 DIAGNOSIS — H353221 Exudative age-related macular degeneration, left eye, with active choroidal neovascularization: Secondary | ICD-10-CM | POA: Diagnosis not present

## 2022-05-07 ENCOUNTER — Encounter: Payer: Self-pay | Admitting: Gastroenterology

## 2022-05-07 ENCOUNTER — Other Ambulatory Visit: Payer: Self-pay | Admitting: Gastroenterology

## 2022-05-08 MED ORDER — DOXYCYCLINE HYCLATE 100 MG PO CAPS: 100.0000 mg | ORAL_CAPSULE | Freq: Two times a day (BID) | ORAL | 0 refills | Status: AC

## 2022-05-09 ENCOUNTER — Encounter: Payer: Self-pay | Admitting: Internal Medicine

## 2022-05-09 ENCOUNTER — Telehealth: Payer: Self-pay | Admitting: Pulmonary Disease

## 2022-05-09 ENCOUNTER — Encounter: Payer: Self-pay | Admitting: Pulmonary Disease

## 2022-05-09 DIAGNOSIS — J9601 Acute respiratory failure with hypoxia: Secondary | ICD-10-CM | POA: Diagnosis not present

## 2022-05-09 NOTE — Telephone Encounter (Signed)
I spoke with the patient. He is wanting to get set up with a POC. He does not currently have one and has not been seen in over a year. I told him he will need an appointment to be evaluate for the POC. I scheduled him an appt for 11/29 at 11:00am with Geraldo Pitter.  Nothing further needed.

## 2022-05-09 NOTE — Telephone Encounter (Signed)
I spoke with the patient. He is wanting to get set up with a POC. He does not currently have one and has not been seen in over a year. I told him he will need an appointment to be evaluate for the POC. I scheduled him an appt for 11/29 at 11:00am with Geraldo Pitter.

## 2022-05-21 ENCOUNTER — Ambulatory Visit (INDEPENDENT_AMBULATORY_CARE_PROVIDER_SITE_OTHER): Payer: Medicare Other | Admitting: Primary Care

## 2022-05-21 ENCOUNTER — Encounter: Payer: Self-pay | Admitting: Primary Care

## 2022-05-21 VITALS — BP 130/78 | HR 78 | Ht 73.0 in | Wt 313.0 lb

## 2022-05-21 DIAGNOSIS — J9611 Chronic respiratory failure with hypoxia: Secondary | ICD-10-CM | POA: Diagnosis not present

## 2022-05-21 DIAGNOSIS — G4730 Sleep apnea, unspecified: Secondary | ICD-10-CM

## 2022-05-21 NOTE — Patient Instructions (Addendum)
Recommendations: - Continue to wear 3L oxygen at night and 4-5L on POC with exertion  - Goal SpO2 maintain >88-90% - Would recommend you see cardiologist d/t bradycardia and evidence of mild heart failure on echocardiogram in 2022 or follow-up with your PCP   Orders: - ONO on 3L oxygen  Referral: - Cardiology re: bradycardia/heart failure   Follow-up:  - First available with Dr. Patsey Berthold in [place]

## 2022-05-21 NOTE — Progress Notes (Unsigned)
$'@Patient'q$  ID: Ernest [name], male    DOB: [birth year], 76 y.o.   MRN: [id number]  Chief Complaint  Patient presents with   Follow-up    Referring provider: [name], MD  HPI: 76 year old male, never smoked. PMH significant for morbid obesity, partial bowel obstruction, OSA.   05/21/2022 Patient is here to qualify for POC. He has hx chronic respiratory failure. He came in without his oxygen today. O2 was in the 90s at rest and dropped to 86% with ambulation. He requiring 4-5L pulsed oxygen with exertion. He has already contacted [employer] about getting POC. He did not tolerate BIPAP in the past and this was discontinued back in December 2022. Dr. Halford Chessman is aware he is no longer using.  He wears 3L oxygen at night. He sleeps in a reclines. He has lost 80lbs. Having GI issues/ SIBO, looking to have adhesions removed.    No Known Allergies  Immunization History  Administered Date(s) Administered   Influenza, High Dose Seasonal PF 09/01/2019   Influenza-Unspecified 02/21/2018, 04/11/2020, 03/04/2021   PFIZER(Purple Top)SARS-COV-2 Vaccination 08/19/2019, 09/13/2019, 04/11/2020, 09/27/2020, 03/04/2021, 03/22/2022   PNEUMOCOCCAL CONJUGATE-20 07/24/2021   Pfizer Covid-19 Vaccine Bivalent Booster 61yr & up 03/04/2021   Pneumococcal-Unspecified 04/16/2020   Respiratory Syncytial Virus Vaccine,Recomb Aduvanted(Arexvy) 02/22/2022   Tdap 07/24/2021    Past Medical History:  Diagnosis Date   Anemia    Arthritis    osteoarthritis - bilateral knees   Chronic venous insufficiency of lower extremity    Dyspnea    GERD (gastroesophageal reflux disease)    Sleep apnea    [place] bowel obstruction ([place] 2022   Uses roller walker     Tobacco History: Social History   Tobacco Use  Smoking Status Never  Smokeless Tobacco Never   Counseling given: Not Answered   Outpatient Medications Prior to Visit  Medication Sig Dispense Refill   acetaminophen (TYLENOL) 325 MG tablet Take 325 mg  by mouth every 8 (eight) hours as needed.     amiloride-hydrochlorothiazide (MODURETIC) 5-50 MG tablet Take 1 tablet by mouth daily. 90 tablet 3   Multiple Vitamins-Minerals (CENTRUM SILVER ADULT 50+ PO) Take by mouth.     nystatin (MYCOSTATIN/NYSTOP) powder Apply 1 Application topically 3 (three) times daily. 60 g 6   pantoprazole (PROTONIX) 40 MG tablet Take 1 tablet (40 mg total) by mouth daily. 90 tablet 3   Probiotic Product (PROBIOTIC DAILY PO) Take by mouth.     No facility-administered medications prior to visit.   Review of Systems  Review of Systems  HENT: Negative.    Respiratory:  Positive for shortness of breath. Negative for apnea, cough, chest tightness and wheezing.   Cardiovascular:  Positive for leg swelling.  Neurological: Negative.      Physical Exam  BP 130/78 (BP Location: Left Arm, Cuff Size: Normal)   Pulse 78   Ht '6\' 1"'$  (1.854 m)   Wt (!) 313 lb (142 kg)   SpO2 98%   BMI 41.30 kg/m  Physical Exam Constitutional:      General: He is not in acute distress.    Appearance: Normal appearance. He is obese. He is not ill-appearing.  HENT:     Mouth/Throat:     Mouth: Mucous membranes are moist.     Pharynx: Oropharynx is clear.  Cardiovascular:     Rate and Rhythm: Normal rate.     Comments: No significant LE edema, wearing compression stockings  Pulmonary:     Effort: No respiratory  distress.     Breath sounds: No wheezing, rhonchi or rales.     Comments: Dyspnea with exertion; Lungs clear  Musculoskeletal:        General: Normal range of motion.  Skin:    General: Skin is warm and dry.  Neurological:     General: No focal deficit present.     Mental Status: He is alert and oriented to person, place, and time. Mental status is at baseline.  Psychiatric:        Mood and Affect: Mood normal.        Behavior: Behavior normal.        Thought Content: Thought content normal.        Judgment: Judgment normal.      Lab Results:  CBC     Component Value Date/Time   WBC 9.9 12/04/2020 1015   WBC 9.2 10/07/2020 0407   RBC 5.75 12/04/2020 1015   RBC 4.50 10/07/2020 0407   HGB 15.1 12/04/2020 1015   HCT 46.0 12/04/2020 1015   PLT 302 12/04/2020 1015   MCV 80 12/04/2020 1015   MCH 26.3 (L) 12/04/2020 1015   MCH 26.9 10/07/2020 0407   MCHC 32.8 12/04/2020 1015   MCHC 33.2 10/07/2020 0407   RDW 14.3 12/04/2020 1015   LYMPHSABS 1.8 10/06/2020 0555   MONOABS 1.4 (H) 10/06/2020 0555   EOSABS 0.0 10/06/2020 0555   BASOSABS 0.0 10/06/2020 0555    BMET    Component Value Date/Time   NA 138 10/07/2020 0407   K 3.5 10/07/2020 0407   CL 101 10/07/2020 0407   CO2 28 10/07/2020 0407   GLUCOSE 103 (H) 10/07/2020 0407   BUN 12 10/07/2020 0407   CREATININE 1.10 04/15/2022 0838   CREATININE 0.83 06/25/2020 1240   CALCIUM 7.9 (L) 10/07/2020 0407   GFRNONAA >60 10/07/2020 0407   GFRNONAA 87 06/25/2020 1240   GFRAA 100 06/25/2020 1240    BNP    Component Value Date/Time   BNP 76.7 10/05/2020 1015    ProBNP No results found for: "PROBNP"  Imaging: No results found.   Assessment & Plan:   Chronic respiratory failure with hypoxia (HCC) - Stable; No requirement at rest. O2 86% RA with ambulation- qualified for POC today, needs 4-5L with exertion. Order has been sent to [facility]. Follow-up with Dr. Patsey Berthold.   Sleep apnea - Patient failed BIPAP, discontinued in December 2022. He is not a candidate for Inspire device d/t BMI. He has lost 80 lbs, continue to encourage weight loss efforts. He reports sleeping well at night, not waking up choking or gasping for air. Continue nocturnal oxygen. Elevate head at night while sleeping.    Martyn Ehrich, NP 05/22/2022

## 2022-05-22 DIAGNOSIS — G4733 Obstructive sleep apnea (adult) (pediatric): Secondary | ICD-10-CM | POA: Insufficient documentation

## 2022-05-22 DIAGNOSIS — J9611 Chronic respiratory failure with hypoxia: Secondary | ICD-10-CM | POA: Insufficient documentation

## 2022-05-22 DIAGNOSIS — G4730 Sleep apnea, unspecified: Secondary | ICD-10-CM | POA: Insufficient documentation

## 2022-05-22 NOTE — Assessment & Plan Note (Addendum)
-   Stable; No requirement at rest. O2 86% RA with ambulation- qualified for POC today, needs 4-5L with exertion. Order has been sent to [facility]. Follow-up with Dr. Patsey Berthold.

## 2022-05-22 NOTE — Assessment & Plan Note (Addendum)
-   Patient failed BIPAP, discontinued in December 2022. He is not a candidate for Inspire device d/t BMI. He has lost 80 lbs, continue to encourage weight loss efforts. He reports sleeping well at night, not waking up choking or gasping for air. Continue nocturnal oxygen. Elevate head at night while sleeping.

## 2022-05-23 ENCOUNTER — Telehealth: Payer: Self-pay | Admitting: Pulmonary Disease

## 2022-05-23 ENCOUNTER — Encounter: Payer: Self-pay | Admitting: Gastroenterology

## 2022-05-23 NOTE — Telephone Encounter (Signed)
Rodena Piety, can you assist with this?

## 2022-05-23 NOTE — Telephone Encounter (Signed)
Received call from Travis stating that he faxed a form over on pt that needs to be filled out in order for pt to receive a POC. Stated to him that we would get the form filled out and signed by BW when she returned to the office 12/4 and he verbalized understanding. Nothing further needed.

## 2022-05-23 NOTE — Telephone Encounter (Signed)
Please see the other encounter for today. The paperwork has been faxed to [facility] for signature. Nothing further needed.

## 2022-05-23 NOTE — Telephone Encounter (Signed)
I have faxed the note and 02 test to [employer] # [phone]

## 2022-05-26 ENCOUNTER — Telehealth: Payer: Self-pay

## 2022-05-26 MED ORDER — RIFAXIMIN 550 MG PO TABS: 550.0000 mg | ORAL_TABLET | Freq: Three times a day (TID) | ORAL | 0 refills | Status: AC

## 2022-05-26 NOTE — Telephone Encounter (Signed)
Insurance company approved through 06/09/2022

## 2022-05-26 NOTE — Telephone Encounter (Signed)
Sent medication to the pharmacy  

## 2022-05-26 NOTE — Telephone Encounter (Signed)
Submitted PA through cover my meds for Xifaxan 550mg. Waiting on response from insurance company  

## 2022-06-02 ENCOUNTER — Encounter: Payer: Self-pay | Admitting: Internal Medicine

## 2022-06-03 ENCOUNTER — Encounter: Payer: Self-pay | Admitting: Gastroenterology

## 2022-06-05 ENCOUNTER — Encounter: Payer: Self-pay | Admitting: Internal Medicine

## 2022-06-05 DIAGNOSIS — K56600 Partial intestinal obstruction, unspecified as to cause: Secondary | ICD-10-CM | POA: Diagnosis not present

## 2022-06-08 DIAGNOSIS — J9601 Acute respiratory failure with hypoxia: Secondary | ICD-10-CM | POA: Diagnosis not present

## 2022-06-09 ENCOUNTER — Ambulatory Visit (INDEPENDENT_AMBULATORY_CARE_PROVIDER_SITE_OTHER): Payer: Medicare Other | Admitting: Internal Medicine

## 2022-06-09 ENCOUNTER — Encounter: Payer: Self-pay | Admitting: Internal Medicine

## 2022-06-09 ENCOUNTER — Telehealth: Payer: Self-pay

## 2022-06-09 VITALS — BP 112/64 | HR 78 | Temp 98.0°F | Ht 73.0 in | Wt 314.0 lb

## 2022-06-09 DIAGNOSIS — I471 Supraventricular tachycardia, unspecified: Secondary | ICD-10-CM | POA: Diagnosis not present

## 2022-06-09 DIAGNOSIS — K219 Gastro-esophageal reflux disease without esophagitis: Secondary | ICD-10-CM

## 2022-06-09 DIAGNOSIS — G4730 Sleep apnea, unspecified: Secondary | ICD-10-CM | POA: Diagnosis not present

## 2022-06-09 DIAGNOSIS — J9611 Chronic respiratory failure with hypoxia: Secondary | ICD-10-CM

## 2022-06-09 DIAGNOSIS — R0602 Shortness of breath: Secondary | ICD-10-CM

## 2022-06-09 NOTE — Telephone Encounter (Signed)
Error

## 2022-06-09 NOTE — Progress Notes (Signed)
Established Patient Office Visit  Subjective:  Patient ID: Ernest [name], male    DOB: 1945/08/20  Age: 75 y.o. MRN: [id number]  CC: No chief complaint on file.   HPI  Ernest [name] presents for check up  Past Medical History:  Diagnosis Date   Anemia    Arthritis    osteoarthritis - bilateral knees   Chronic venous insufficiency of lower extremity    Dyspnea    GERD (gastroesophageal reflux disease)    Sleep apnea    Small bowel obstruction ([place]) 2022   Uses roller walker     Past Surgical History:  Procedure Laterality Date   CATARACT EXTRACTION W/PHACO Left 08/06/2020   Procedure: CATARACT EXTRACTION PHACO AND INTRAOCULAR LENS PLACEMENT ([place]) LEFT;  Surgeon: Eulogio Bear, MD;  Location: [place];  Service: Ophthalmology;  Laterality: Left;  3.41 0:31.5   CATARACT EXTRACTION W/PHACO Right 08/27/2020   Procedure: CATARACT EXTRACTION PHACO AND INTRAOCULAR LENS PLACEMENT ([facility]) RIGHT 4.10 00:41.6;  Surgeon: Eulogio Bear, MD;  Location: [place];  Service: Ophthalmology;  Laterality: Right;   COLONOSCOPY WITH ESOPHAGOGASTRODUODENOSCOPY (EGD)     COLONOSCOPY WITH PROPOFOL N/A 11/01/2020   Procedure: COLONOSCOPY WITH PROPOFOL;  Surgeon: Lin Landsman, MD;  Location: [facility] ENDOSCOPY;  Service: Gastroenterology;  Laterality: N/A;   COLONOSCOPY WITH PROPOFOL N/A 03/18/2022   Procedure: COLONOSCOPY WITH BIOPSIES;  Surgeon: Lin Landsman, MD;  Location: [place];  Service: Endoscopy;  Laterality: N/A;   ESOPHAGOGASTRODUODENOSCOPY (EGD) WITH PROPOFOL N/A 11/01/2020   Procedure: ESOPHAGOGASTRODUODENOSCOPY (EGD) WITH PROPOFOL;  Surgeon: Lin Landsman, MD;  Location: [facility] ENDOSCOPY;  Service: Gastroenterology;  Laterality: N/A;   HERNIA REPAIR     inguinal and umbilical   POLYPECTOMY  03/18/2022   Procedure: POLYPECTOMY;  Surgeon: Lin Landsman, MD;  Location: [place];  Service: Endoscopy;;    History  reviewed. No pertinent family history.  Social History   Socioeconomic History   Marital status: Married    Spouse name: Not on file   Number of children: Not on file   Years of education: Not on file   Highest education level: Not on file  Occupational History   Not on file  Tobacco Use   Smoking status: Never   Smokeless tobacco: Never  Substance and Sexual Activity   Alcohol use: Yes    Alcohol/week: 0.0 standard drinks of alcohol    Comment: rarely   Drug use: Not Currently   Sexual activity: Not on file  Other Topics Concern   Not on file  Social History Narrative   Not on file   Social Determinants of Health   Financial Resource Strain: Low Risk  (06/21/2021)   Overall Financial Resource Strain (CARDIA)    Difficulty of Paying Living Expenses: Not hard at all  Food Insecurity: No Food Insecurity (06/21/2021)   Hunger Vital Sign    Worried About Running Out of Food in the Last Year: Never true    Ran Out of Food in the Last Year: Never true  Transportation Needs: No Transportation Needs (06/21/2021)   PRAPARE - Hydrologist (Medical): No    Lack of Transportation (Non-Medical): No  Physical Activity: Inactive (06/21/2021)   Exercise Vital Sign    Days of Exercise per Week: 0 days    Minutes of Exercise per Session: 0 min  Stress: No Stress Concern Present (06/21/2021)   [place] -  Occupational Stress Questionnaire    Feeling of Stress : Not at all  Social Connections: Unknown (06/21/2021)   Social Connection and Isolation Panel [NHANES]    Frequency of Communication with Friends and Family: Twice a week    Frequency of Social Gatherings with Friends and Family: Never    Attends Religious Services: Patient refused    Marine scientist or Organizations: Patient refused    Attends Archivist Meetings: Patient refused    Marital Status: Married  Human resources officer Violence: Not At Risk  (06/21/2021)   Humiliation, Afraid, Rape, and Kick questionnaire    Fear of Current or Ex-Partner: No    Emotionally Abused: No    Physically Abused: No    Sexually Abused: No     Current Outpatient Medications:    acetaminophen (TYLENOL) 325 MG tablet, Take 325 mg by mouth every 8 (eight) hours as needed., Disp: , Rfl:    amiloride-hydrochlorothiazide (MODURETIC) 5-50 MG tablet, Take 1 tablet by mouth daily., Disp: 90 tablet, Rfl: 3   Multiple Vitamins-Minerals (CENTRUM SILVER ADULT 50+ PO), Take by mouth., Disp: , Rfl:    nystatin (MYCOSTATIN/NYSTOP) powder, Apply 1 Application topically 3 (three) times daily., Disp: 60 g, Rfl: 6   pantoprazole (PROTONIX) 40 MG tablet, Take 1 tablet (40 mg total) by mouth daily., Disp: 90 tablet, Rfl: 3   Probiotic Product (PROBIOTIC DAILY PO), Take by mouth., Disp: , Rfl:    rifaximin (XIFAXAN) 550 MG TABS tablet, Take 1 tablet (550 mg total) by mouth 3 (three) times daily for 14 days., Disp: 42 tablet, Rfl: 0   No Known Allergies  ROS Review of Systems  Constitutional: Negative.   HENT: Negative.    Eyes: Negative.   Respiratory: Negative.    Cardiovascular: Negative.   Gastrointestinal: Negative.   Endocrine: Negative.   Genitourinary: Negative.   Musculoskeletal: Negative.   Skin: Negative.   Allergic/Immunologic: Negative.   Neurological: Negative.   Hematological: Negative.   Psychiatric/Behavioral: Negative.    All other systems reviewed and are negative.     Objective:    Physical Exam Vitals reviewed.  Constitutional:      Appearance: Normal appearance.  HENT:     Mouth/Throat:     Mouth: Mucous membranes are moist.  Eyes:     Pupils: Pupils are equal, round, and reactive to light.  Neck:     Vascular: No carotid bruit.  Cardiovascular:     Rate and Rhythm: Normal rate and regular rhythm.     Pulses: Normal pulses.     Heart sounds: Normal heart sounds.  Pulmonary:     Effort: Pulmonary effort is normal.      Breath sounds: Normal breath sounds.  Abdominal:     General: Bowel sounds are normal.     Palpations: Abdomen is soft. There is no hepatomegaly, splenomegaly or mass.     Tenderness: There is no abdominal tenderness.     Hernia: No hernia is present.  Musculoskeletal:     Cervical back: Neck supple.     Right lower leg: No edema.     Left lower leg: No edema.  Skin:    Findings: No rash.  Neurological:     Mental Status: He is alert and oriented to person, place, and time.     Motor: No weakness.  Psychiatric:        Mood and Affect: Mood normal.        Behavior: Behavior normal.  There were no vitals taken for this visit. Wt Readings from Last 3 Encounters:  05/21/22 (!) 313 lb (142 kg)  03/18/22 (!) 334 lb (151.5 kg)  07/04/21 (!) 355 lb 6 oz (161.2 kg)     Health Maintenance Due  Topic Date Due   Hepatitis C Screening  Never done   Zoster Vaccines- Shingrix (1 of 2) Never done   INFLUENZA VACCINE  01/21/2022   COVID-19 Vaccine (8 - 2023-24 season) 05/17/2022   Medicare Annual Wellness (AWV)  06/21/2022    There are no preventive care reminders to display for this patient.  No results found for: "TSH" Lab Results  Component Value Date   WBC 9.9 12/04/2020   HGB 15.1 12/04/2020   HCT 46.0 12/04/2020   MCV 80 12/04/2020   PLT 302 12/04/2020   Lab Results  Component Value Date   NA 138 10/07/2020   K 3.5 10/07/2020   CO2 28 10/07/2020   GLUCOSE 103 (H) 10/07/2020   BUN 12 10/07/2020   CREATININE 1.10 04/15/2022   BILITOT 0.7 10/06/2020   ALKPHOS 34 (L) 10/06/2020   AST 24 10/06/2020   ALT 13 10/06/2020   PROT 5.7 (L) 10/06/2020   ALBUMIN 2.9 (L) 10/06/2020   CALCIUM 7.9 (L) 10/07/2020   ANIONGAP 9 10/07/2020   No results found for: "CHOL" No results found for: "HDL" No results found for: "LDLCALC" No results found for: "TRIG" No results found for: "CHOLHDL" No results found for: "HGBA1C"    Assessment & Plan:   Problem List Items  Addressed This Visit       Cardiovascular and Mediastinum   SVT (supraventricular tachycardia) - Primary    Resolved at the present time.        Respiratory   Chronic respiratory failure with hypoxia ([place])    Patient was advised to lose weight continue to use CPAP machine      Sleep apnea    Under control        Digestive   GERD (gastroesophageal reflux disease)    Counseling  If a person has gastroesophageal reflux disease (GERD), food and stomach acid move back up into the esophagus and cause symptoms or problems such as damage to the esophagus.  Anti-reflux measures include: raising the head of the bed, avoiding tight clothing or belts, avoiding eating late at night, not lying down shortly after mealtime, and achieving weight loss.  Avoid ASA, NSAID's, caffeine, alcohol, and tobacco.   OTC Pepcid and/or Tums are often very helpful for as needed use.   However, for persisting chronic or daily symptoms, stronger medications like Omeprazole may be needed.  You may need to avoid foods and drinks such as: ? Coffee and tea (with or without caffeine). ? Drinks that contain alcohol. ? Energy drinks and sports drinks. ? Bubbly (carbonated) drinks or sodas. ? Chocolate and cocoa. ? Peppermint and mint flavorings. ? Garlic and onions. ? Horseradish. ? Spicy and acidic foods. These include peppers, chili powder, curry powder, vinegar, hot sauces, and BBQ sauce. ? Citrus fruit juices and citrus fruits, such as oranges, lemons, and limes. ? Tomato-based foods. These include red sauce, chili, salsa, and pizza with red sauce. ? Fried and fatty foods. These include donuts, french fries, potato chips, and high-fat dressings. ? High-fat meats. These include hot dogs, rib eye steak, sausage, ham, and bacon.      Patient is referred to the cardiology for evaluation of shortness of breath.  No orders of the defined  types were placed in this encounter.   Follow-up: No follow-ups on  file.    [name], MD

## 2022-06-09 NOTE — Assessment & Plan Note (Signed)
Under control 

## 2022-06-09 NOTE — Assessment & Plan Note (Signed)
Patient was advised to lose weight continue to use CPAP machine

## 2022-06-09 NOTE — Assessment & Plan Note (Signed)

## 2022-06-09 NOTE — Assessment & Plan Note (Signed)
Resolved at the present time.

## 2022-06-12 ENCOUNTER — Encounter: Payer: Self-pay | Admitting: Cardiovascular Disease

## 2022-06-25 ENCOUNTER — Encounter: Payer: Self-pay | Admitting: Cardiovascular Disease

## 2022-06-25 ENCOUNTER — Ambulatory Visit: Payer: TRICARE For Life (TFL) | Attending: Cardiovascular Disease | Admitting: Cardiovascular Disease

## 2022-06-25 VITALS — BP 84/54 | HR 138 | Ht 73.0 in | Wt 314.4 lb

## 2022-06-25 DIAGNOSIS — G4733 Obstructive sleep apnea (adult) (pediatric): Secondary | ICD-10-CM | POA: Diagnosis not present

## 2022-06-25 DIAGNOSIS — E662 Morbid (severe) obesity with alveolar hypoventilation: Secondary | ICD-10-CM | POA: Diagnosis not present

## 2022-06-25 DIAGNOSIS — I50812 Chronic right heart failure: Secondary | ICD-10-CM | POA: Diagnosis not present

## 2022-06-25 DIAGNOSIS — I4891 Unspecified atrial fibrillation: Secondary | ICD-10-CM

## 2022-06-25 DIAGNOSIS — I10 Essential (primary) hypertension: Secondary | ICD-10-CM | POA: Diagnosis not present

## 2022-06-25 DIAGNOSIS — D6869 Other thrombophilia: Secondary | ICD-10-CM

## 2022-06-25 MED ORDER — DIGOXIN 125 MCG PO TABS: 0.1250 mg | ORAL_TABLET | Freq: Every day | ORAL | 3 refills | Status: DC

## 2022-06-25 MED ORDER — APIXABAN 5 MG PO TABS: 5.0000 mg | ORAL_TABLET | Freq: Two times a day (BID) | ORAL | 3 refills | Status: DC

## 2022-06-25 NOTE — Progress Notes (Signed)
Cardiology Office Note:    Date:  06/27/2022   ID:  Ernest [name], DOB 1945/07/25, MRN [id number]  PCP:  Ernest [name], [facility] Providers Cardiologist:  None     Referring MD: Ernest Athens, MD   No chief complaint on file. Ernest [name] is a 77 y.o. male who is being seen today for the evaluation of dyspnea, SVT at the request of Ernest Athens, MD.   History of Present Illness:    Ernest [name] [name] is a 77 y.o. male with a hx of chronic respiratory insufficiency on home O2, morbid obesity, obstructive sleep apnea intolerant to BiPAP, peripheral venous insufficiency, GERD, partial small bowel obstruction, hypertension, SVT, referred in consultation.  He has been on oxygen supplementation for the last couple of years.  He is not sure what the exact mechanism for his respiratory failure is.  He has never smoked cigarettes.  He does have confirmed severe sleep apnea (polysomnogram 01/11/2021 showed AHI 38.8 and oxygen saturation nadir of 75%) and obesity hypoventilation syndrome.  He has managed to lose as much is 100 pounds but remains morbidly obese with a BMI of 41.  He did not tolerate BiPAP and is not a candidate for the inspire device due to his weight.  He is just receiving oxygen supplementation.  He has normal left ventricular systolic function by echocardiogram performed in 2022 and there was evidence of only mild diastolic dysfunction at that time.  He has had recurrent problems with bowel obstruction due to adhesions and wants to undergo another surgery to help relieve his problems with abdominal distention.  He also has a history of exocrine pancreatic insufficiency.  Very sedentary.  He does not have any problems with chest pain at rest or with a light activity that he is able to perform.  He denies palpitations and denies syncope, but he experiences severe lightheadedness if he stands up without walking.  Sounds like he is describing orthostatic  hypotension.  He is taking a combination thiazide/potassium sparing diuretic combination for hypertension.  The dose has recently been decreased to half of his previous dose.  He is hypotensive today at 84/54 mmHg.  He is in atrial fibrillation but is completely unaware of the arrhythmia.  ECG performed in January 2022 showed a regular narrow complex tachycardia that is probably junctional tachycardia, clear evidence of P waves is hard to discern.  Past Medical History:  Diagnosis Date   Anemia    Arthritis    osteoarthritis - bilateral knees   Chronic venous insufficiency of lower extremity    Dyspnea    GERD (gastroesophageal reflux disease)    Sleep apnea    Small bowel obstruction ([place]) 2022   Uses roller walker     Past Surgical History:  Procedure Laterality Date   CATARACT EXTRACTION W/PHACO Left 08/06/2020   Procedure: CATARACT EXTRACTION PHACO AND INTRAOCULAR LENS PLACEMENT ([place]) LEFT;  Surgeon: Eulogio Bear, MD;  Location: [place];  Service: Ophthalmology;  Laterality: Left;  3.41 0:31.5   CATARACT EXTRACTION W/PHACO Right 08/27/2020   Procedure: CATARACT EXTRACTION PHACO AND INTRAOCULAR LENS PLACEMENT ([facility]) RIGHT 4.10 00:41.6;  Surgeon: Eulogio Bear, MD;  Location: [place];  Service: Ophthalmology;  Laterality: Right;   COLONOSCOPY WITH ESOPHAGOGASTRODUODENOSCOPY (EGD)     COLONOSCOPY WITH PROPOFOL N/A 11/01/2020   Procedure: COLONOSCOPY WITH PROPOFOL;  Surgeon: Lin Landsman, MD;  Location: [facility] ENDOSCOPY;  Service: Gastroenterology;  Laterality: N/A;  COLONOSCOPY WITH PROPOFOL N/A 03/18/2022   Procedure: COLONOSCOPY WITH BIOPSIES;  Surgeon: Lin Landsman, MD;  Location: [place];  Service: Endoscopy;  Laterality: N/A;   ESOPHAGOGASTRODUODENOSCOPY (EGD) WITH PROPOFOL N/A 11/01/2020   Procedure: ESOPHAGOGASTRODUODENOSCOPY (EGD) WITH PROPOFOL;  Surgeon: Lin Landsman, MD;  Location: [facility] ENDOSCOPY;  Service:  Gastroenterology;  Laterality: N/A;   HERNIA REPAIR     inguinal and umbilical   POLYPECTOMY  03/18/2022   Procedure: POLYPECTOMY;  Surgeon: Lin Landsman, MD;  Location: [place];  Service: Endoscopy;;    Current Medications: Current Meds  Medication Sig   apixaban (ELIQUIS) 5 MG TABS tablet Take 1 tablet (5 mg total) by mouth 2 (two) times daily.   digoxin (LANOXIN) 0.125 MG tablet Take 1 tablet (0.125 mg total) by mouth daily.   Multiple Vitamins-Minerals (CENTRUM SILVER ADULT 50+ PO) Take by mouth.   nystatin (MYCOSTATIN/NYSTOP) powder Apply 1 Application topically 3 (three) times daily.   pantoprazole (PROTONIX) 40 MG tablet Take 1 tablet (40 mg total) by mouth daily.   Phenazopyridine HCl (AZO TABS PO) Take 2 tablets by mouth 3 (three) times daily.   Probiotic Product (PROBIOTIC DAILY PO) Take by mouth.   [DISCONTINUED] amiloride-hydrochlorothiazide (MODURETIC) 5-50 MG tablet Take 1 tablet by mouth daily.     Allergies:   Mucinex [guaifenesin er]   Social History   Socioeconomic History   Marital status: Married    Spouse name: Not on file   Number of children: Not on file   Years of education: Not on file   Highest education level: Not on file  Occupational History   Not on file  Tobacco Use   Smoking status: Never   Smokeless tobacco: Never  Substance and Sexual Activity   Alcohol use: Yes    Alcohol/week: 0.0 standard drinks of alcohol    Comment: rarely   Drug use: Not Currently   Sexual activity: Not on file  Other Topics Concern   Not on file  Social History Narrative   Not on file   Social Determinants of Health   Financial Resource Strain: Low Risk  (06/21/2021)   Overall Financial Resource Strain (CARDIA)    Difficulty of Paying Living Expenses: Not hard at all  Food Insecurity: No Food Insecurity (06/21/2021)   Hunger Vital Sign    Worried About Running Out of Food in the Last Year: Never true    Ran Out of Food in the Last Year:  Never true  Transportation Needs: No Transportation Needs (06/21/2021)   PRAPARE - Hydrologist (Medical): No    Lack of Transportation (Non-Medical): No  Physical Activity: Inactive (06/21/2021)   Exercise Vital Sign    Days of Exercise per Week: 0 days    Minutes of Exercise per Session: 0 min  Stress: No Stress Concern Present (06/21/2021)   [place]    Feeling of Stress : Not at all  Social Connections: Unknown (06/21/2021)   Social Connection and Isolation Panel [NHANES]    Frequency of Communication with Friends and Family: Twice a week    Frequency of Social Gatherings with Friends and Family: Never    Attends Religious Services: Patient refused    Marine scientist or Organizations: Patient refused    Attends Archivist Meetings: Patient refused    Marital Status: Married     Family History: The patient's family history significantly negative for early  onset coronary or peripheral vascular disease.  He is not aware of any family history of atrial fibrillation.  ROS:   Please see the history of present illness.     All other systems reviewed and are negative.  EKGs/Labs/Other Studies Reviewed:    The following studies were reviewed today: ECHO 01/10/2021   1. Left ventricular ejection fraction, by estimation, is 50 to 55%. The  left ventricle has low normal function. The left ventricle has no regional  wall motion abnormalities. Left ventricular diastolic parameters are  consistent with Grade I diastolic  dysfunction (impaired relaxation).   2. Right ventricular systolic function is normal. The right ventricular  size is normal. Tricuspid regurgitation signal is inadequate for assessing  PA pressure.   3. The inferior vena cava is dilated in size with <50% respiratory  variability, suggesting right atrial pressure of 15 mmHg.   4. Challenging image quality  secondary to body habitus.   EKG:  EKG is ordered today.  The ekg ordered today demonstrates atrial fibrillation, generalized low voltage  Recent Labs: 04/15/2022: Creatinine, Ser 1.10  Recent Lipid Panel No results found for: "CHOL", "TRIG", "HDL", "CHOLHDL", "VLDL", "LDLCALC", "LDLDIRECT"   Risk Assessment/Calculations:    CHA2DS2-VASc Score = 4   This indicates a 4.8% annual risk of stroke. The patient's score is based upon: CHF History: 1 HTN History: 1 Diabetes History: 0 Stroke History: 0 Vascular Disease History: 0 Age Score: 2 Gender Score: 0      Physical Exam:    VS:  BP (!) 84/54 (BP Location: Left Arm, Patient Position: Sitting, Cuff Size: Large)   Pulse (!) 138   Ht '6\' 1"'$  (1.854 m)   Wt (!) 314 lb 6.4 oz (142.6 kg)   SpO2 96%   BMI 41.48 kg/m     Wt Readings from Last 3 Encounters:  06/25/22 (!) 314 lb 6.4 oz (142.6 kg)  06/09/22 (!) 314 lb (142.4 kg)  05/21/22 (!) 313 lb (142 kg)     GEN: Morbidly obese.  In a wheelchair.  Well nourished, well developed in no acute distress HEENT: Normal NECK: No JVD; No carotid bruits LYMPHATICS: No lymphadenopathy CARDIAC: Irregular,  no murmurs, rubs, gallops RESPIRATORY:  Clear to auscultation without rales, wheezing or rhonchi  ABDOMEN: Soft, non-tender, appears distended MUSCULOSKELETAL: 1+ symmetrical calf edema; varicose veins; No deformity  SKIN: Warm and dry NEUROLOGIC:  Alert and oriented x 3 PSYCHIATRIC:  Normal affect   ASSESSMENT:    1. Atrial fibrillation with rapid ventricular response ([place])   2. Acquired thrombophilia ([place])   3. Chronic right-sided heart failure ([facility])   4. Obesity hypoventilation syndrome ([place])   5. OSA (obstructive sleep apnea)   6. Essential hypertension    PLAN:    In order of problems listed above:  Afib: Unclear if this is paroxysmal or persistent at this point.  He has rapid ventricular response.  Rate control be challenging because of his low blood pressure.   Stop his amiloride-hydrochlorothiazide and start digoxin.  If his blood pressure increases once we stop his diuretic can add a beta-blocker.  Once he has been on anticoagulation can consider amiodarone.  Discussed the risk of embolic stroke and the need for chronic anticoagulation.  Bring him back to the A-fib clinic to see if he has persistent atrial fibrillation and to assess adequacy of rate control.  Check digoxin level at that time (need for level, take his medication after the level is checked). Anticoagulation: CHA2DS2-VASc score is at  least 4.  No history of TIA or stroke. Start Eliquis 5 mg twice daily.  RHF: The substrate for his atrial fibrillation is almost certainly right heart failure/chronic cor pulmonale from insufficiently treated obstructive sleep apnea/obstructive obesity hypoventilation syndrome.  He has not tolerated BiPAP.  He is continuing to try to lose weight.  Repeat an echocardiogram. OHS/OSA: It will be very difficult to control his arrhythmia unless his hypoventilation issues are treated. HTN: Currently BP is low.  If his blood pressure increases, beta-blockers will be the optimal agent for control since this will also help with arrhythmia control.   In the long run, he would prefer follow-up in [place] closer to home.  Will discuss follow-up with one of our colleagues at that office.       Medication Adjustments/Labs and Tests Ordered: Current medicines are reviewed at length with the patient today.  Concerns regarding medicines are outlined above.  Orders Placed This Encounter  Procedures   Brain natriuretic peptide   Basic metabolic panel   Digoxin level   Amb Referral to AFIB Clinic   EKG 12-Lead   ECHOCARDIOGRAM COMPLETE   Meds ordered this encounter  Medications   digoxin (LANOXIN) 0.125 MG tablet    Sig: Take 1 tablet (0.125 mg total) by mouth daily.    Dispense:  90 tablet    Refill:  3   apixaban (ELIQUIS) 5 MG TABS tablet    Sig: Take 1 tablet (5  mg total) by mouth 2 (two) times daily.    Dispense:  180 tablet    Refill:  3    Patient Instructions  Medication Instructions:  Your physician has recommended you make the following change in your medication:   -Stop taking amiloride-HCTZ (Moduretic).  -Start taking digoxin (Lanoxin) 0.'125mg'$  once daily.  -Start taking apixaban (Eliquis) '5mg'$  twice daily.  *If you need a refill on your cardiac medications before your next appointment, please call your pharmacy*   Lab Work: Your physician recommends that you return for lab work in: 1 week BMET, BNP and Digoxin level  If you have labs (blood work) drawn today and your tests are completely normal, you will receive your results only by: [place] (if you have MyChart) OR A paper copy in the mail If you have any lab test that is abnormal or we need to change your treatment, we will call you to review the results.   Testing/Procedures: Your physician has requested that you have an echocardiogram. Echocardiography is a painless test that uses sound waves to create images of your heart. It provides your doctor with information about the size and shape of your heart and how well your heart's chambers and valves are working. This procedure takes approximately one hour. There are no restrictions for this procedure. Please do NOT wear cologne, perfume, aftershave, or lotions (deodorant is allowed). Please arrive 15 minutes prior to your appointment time. We will schedule this in [place].    Follow-Up: At [facility], you and your health needs are our priority.  As part of our continuing mission to provide you with exceptional heart care, we have created designated Provider Care Teams.  These Care Teams include your primary Cardiologist (physician) and Advanced Practice Providers (APPs -  Physician Assistants and Nurse Practitioners) who all work together to provide you with the care you need, when you need it.  We  recommend signing up for the patient portal called "MyChart".  Sign up information is provided on this After Visit  Summary.  MyChart is used to connect with patients for Virtual Visits (Telemedicine).  Patients are able to view lab/test results, encounter notes, upcoming appointments, etc.  Non-urgent messages can be sent to your provider as well.   To learn more about what you can do with MyChart, go to [email or web address].    Your next appointment:   To be determined.   Provider:   Sanda Klein, MD   Signed, Sanda Klein, MD  06/27/2022 3:54 PM    [place]

## 2022-06-25 NOTE — Patient Instructions (Signed)
Medication Instructions:  Your physician has recommended you make the following change in your medication:   -Stop taking amiloride-HCTZ (Moduretic).  -Start taking digoxin (Lanoxin) 0.'125mg'$  once daily.  -Start taking apixaban (Eliquis) '5mg'$  twice daily.  *If you need a refill on your cardiac medications before your next appointment, please call your pharmacy*   Lab Work: Your physician recommends that you return for lab work in: 1 week BMET, BNP and Digoxin level  If you have labs (blood work) drawn today and your tests are completely normal, you will receive your results only by: [place] (if you have MyChart) OR A paper copy in the mail If you have any lab test that is abnormal or we need to change your treatment, we will call you to review the results.   Testing/Procedures: Your physician has requested that you have an echocardiogram. Echocardiography is a painless test that uses sound waves to create images of your heart. It provides your doctor with information about the size and shape of your heart and how well your heart's chambers and valves are working. This procedure takes approximately one hour. There are no restrictions for this procedure. Please do NOT wear cologne, perfume, aftershave, or lotions (deodorant is allowed). Please arrive 15 minutes prior to your appointment time. We will schedule this in [place].    Follow-Up: At [facility], you and your health needs are our priority.  As part of our continuing mission to provide you with exceptional heart care, we have created designated Provider Care Teams.  These Care Teams include your primary Cardiologist (physician) and Advanced Practice Providers (APPs -  Physician Assistants and Nurse Practitioners) who all work together to provide you with the care you need, when you need it.  We recommend signing up for the patient portal called "MyChart".  Sign up information is provided on this After Visit  Summary.  MyChart is used to connect with patients for Virtual Visits (Telemedicine).  Patients are able to view lab/test results, encounter notes, upcoming appointments, etc.  Non-urgent messages can be sent to your provider as well.   To learn more about what you can do with MyChart, go to [email or web address].    Your next appointment:   To be determined.   Provider:   Sanda Klein, MD

## 2022-06-26 ENCOUNTER — Encounter: Payer: Self-pay | Admitting: Cardiovascular Disease

## 2022-06-26 ENCOUNTER — Telehealth: Payer: Self-pay

## 2022-06-26 DIAGNOSIS — R140 Abdominal distension (gaseous): Secondary | ICD-10-CM | POA: Diagnosis not present

## 2022-06-26 DIAGNOSIS — K56600 Partial intestinal obstruction, unspecified as to cause: Secondary | ICD-10-CM | POA: Diagnosis not present

## 2022-06-26 DIAGNOSIS — R109 Unspecified abdominal pain: Secondary | ICD-10-CM | POA: Diagnosis not present

## 2022-06-26 DIAGNOSIS — I959 Hypotension, unspecified: Secondary | ICD-10-CM | POA: Diagnosis not present

## 2022-06-26 DIAGNOSIS — R0600 Dyspnea, unspecified: Secondary | ICD-10-CM | POA: Diagnosis not present

## 2022-06-26 NOTE — Telephone Encounter (Signed)
Pt would like to do follow up care in [place] office. Dr. Sallyanne Kuster recommends that pt see Dr. Garen Lah.  Thank you!

## 2022-06-27 ENCOUNTER — Ambulatory Visit: Payer: TRICARE For Life (TFL) | Admitting: Pulmonary Disease

## 2022-06-27 ENCOUNTER — Encounter: Payer: Self-pay | Admitting: Internal Medicine

## 2022-06-27 DIAGNOSIS — E662 Morbid (severe) obesity with alveolar hypoventilation: Secondary | ICD-10-CM | POA: Insufficient documentation

## 2022-06-27 DIAGNOSIS — I10 Essential (primary) hypertension: Secondary | ICD-10-CM | POA: Insufficient documentation

## 2022-06-27 DIAGNOSIS — I4819 Other persistent atrial fibrillation: Secondary | ICD-10-CM | POA: Insufficient documentation

## 2022-06-27 DIAGNOSIS — D6869 Other thrombophilia: Secondary | ICD-10-CM | POA: Insufficient documentation

## 2022-06-27 DIAGNOSIS — I50812 Chronic right heart failure: Secondary | ICD-10-CM | POA: Insufficient documentation

## 2022-06-27 DIAGNOSIS — I4891 Unspecified atrial fibrillation: Secondary | ICD-10-CM | POA: Insufficient documentation

## 2022-06-27 NOTE — Telephone Encounter (Signed)
Scheduled

## 2022-06-27 NOTE — Telephone Encounter (Signed)
Patient will now be followed up by Dr. Mylo Red.  Dr. Sallyanne Kuster and Agbor both agreed.  Patient is scheduled for an ECHO here in our office 1/16 I do believe Can we schedule him a follow up after the echo with Agbor please.

## 2022-06-27 NOTE — Telephone Encounter (Signed)
Please see note below. 

## 2022-07-02 ENCOUNTER — Encounter: Payer: Self-pay | Admitting: Internal Medicine

## 2022-07-04 DIAGNOSIS — N390 Urinary tract infection, site not specified: Secondary | ICD-10-CM | POA: Diagnosis not present

## 2022-07-04 DIAGNOSIS — I4891 Unspecified atrial fibrillation: Secondary | ICD-10-CM | POA: Diagnosis not present

## 2022-07-04 DIAGNOSIS — I50812 Chronic right heart failure: Secondary | ICD-10-CM | POA: Diagnosis not present

## 2022-07-07 ENCOUNTER — Encounter: Payer: Self-pay | Admitting: Cardiovascular Disease

## 2022-07-07 ENCOUNTER — Encounter: Payer: Self-pay | Admitting: Cardiology

## 2022-07-07 LAB — BASIC METABOLIC PANEL
BUN/Creatinine Ratio: 13 (ref 10–24)
BUN: 15 mg/dL (ref 8–27)
CO2: 25 mmol/L (ref 20–29)
Calcium: 8.4 mg/dL — ABNORMAL LOW (ref 8.6–10.2)
Chloride: 98 mmol/L (ref 96–106)
Creatinine, Ser: 1.12 mg/dL (ref 0.76–1.27)
Glucose: 89 mg/dL (ref 70–99)
Potassium: 3.3 mmol/L — ABNORMAL LOW (ref 3.5–5.2)
Sodium: 141 mmol/L (ref 134–144)
eGFR: 68 mL/min/{1.73_m2} (ref >59)

## 2022-07-07 LAB — DIGOXIN LEVEL: Digoxin, Serum: 0.4 ng/mL — ABNORMAL LOW (ref 0.5–0.9)

## 2022-07-07 LAB — BRAIN NATRIURETIC PEPTIDE: BNP: 158.7 pg/mL — ABNORMAL HIGH (ref 0.0–100.0)

## 2022-07-08 ENCOUNTER — Ambulatory Visit: Payer: Medicare Other | Attending: Cardiovascular Disease

## 2022-07-08 DIAGNOSIS — I4891 Unspecified atrial fibrillation: Secondary | ICD-10-CM

## 2022-07-08 MED ORDER — MIDODRINE HCL 5 MG PO TABS: ORAL_TABLET | ORAL | 11 refills | Status: DC

## 2022-07-08 MED ORDER — POTASSIUM CHLORIDE CRYS ER 20 MEQ PO TBCR: 20.0000 meq | EXTENDED_RELEASE_TABLET | Freq: Every day | ORAL | 3 refills | Status: DC

## 2022-07-08 NOTE — Telephone Encounter (Signed)
Per Dr Sallyanne Kuster notes from lab results  Digoxin level was not elevated.  That does not mean we need to increase the amount of digoxin (the test is only useful to make sure that there is no toxicity from the medication). The BNP is marginally elevated and might actually be within normal range for age.  This reinforces the impression that Ernest [name] has mostly right heart failure, likely from lung problems. The potassium level is low.  It is important to keep the potassium normal when taking digoxin. Please start potassium chloride 20 mEq once daily. I also received a message about his blood pressure still being quite low.  Recommend starting midodrine 5 mg twice daily.  This should be taken first thing in the morning and about 4-5 hours later.  The second dose should not be taken less than 4 hours before bedtime.    RN e-sent to pharmacy

## 2022-07-08 NOTE — Telephone Encounter (Signed)
I reached out to [place] at the [place] office where pt is seen to see if results have been sent yet.

## 2022-07-09 DIAGNOSIS — J9601 Acute respiratory failure with hypoxia: Secondary | ICD-10-CM | POA: Diagnosis not present

## 2022-07-09 LAB — ECHOCARDIOGRAM COMPLETE
AR max vel: 3.94 cm2
AV Area VTI: 4.18 cm2
AV Area mean vel: 4.16 cm2
AV Mean grad: 2.3 mmHg
AV Peak grad: 4.4 mmHg
Ao pk vel: 1.04 m/s
Area-P 1/2: 3.45 cm2

## 2022-07-10 ENCOUNTER — Telehealth: Payer: Self-pay | Admitting: Primary Care

## 2022-07-10 NOTE — Telephone Encounter (Signed)
ONO 07/01/22 showed patient spent 29 seconds with SpO2 <88%. SpO2 low was 88%. Basal SPO2 96%.  Did he wear Oxygen during test? It appears that he did, however, documentation on ONO states that it was done on room air

## 2022-07-10 NOTE — Telephone Encounter (Signed)
Spoke to patient and relayed below results. He stated that he did in fact wear oxygen during ONO. He is willing to repeat ONO.  Beth, please advise. Thanks

## 2022-07-11 NOTE — Telephone Encounter (Signed)
Lm for patient.  

## 2022-07-11 NOTE — Telephone Encounter (Signed)
It is documented O2 desaturated to 86% during walk testing in November 2023. He needs oxygen. I do not need him to repeat ONO. Continue oxygen as directed with exertion and at bedtime.

## 2022-07-11 NOTE — Telephone Encounter (Signed)
Patient is aware of below message/recommendations and voiced his understanding.  Nothing further needed.  

## 2022-07-12 ENCOUNTER — Encounter: Payer: Self-pay | Admitting: Cardiovascular Disease

## 2022-07-14 ENCOUNTER — Ambulatory Visit (HOSPITAL_COMMUNITY)
Admission: RE | Admit: 2022-07-14 | Discharge: 2022-07-14 | Disposition: A | Discharge status: Home / Self Care | Payer: Medicare Other | Source: Ambulatory Visit | Attending: Physician Assistant | Admitting: Physician Assistant

## 2022-07-14 ENCOUNTER — Encounter (HOSPITAL_COMMUNITY): Payer: Self-pay | Admitting: Physician Assistant

## 2022-07-14 VITALS — BP 76/68 | HR 117 | Ht 73.0 in | Wt 324.2 lb

## 2022-07-14 DIAGNOSIS — R0689 Other abnormalities of breathing: Secondary | ICD-10-CM | POA: Diagnosis not present

## 2022-07-14 DIAGNOSIS — I471 Supraventricular tachycardia, unspecified: Secondary | ICD-10-CM | POA: Diagnosis not present

## 2022-07-14 DIAGNOSIS — G4733 Obstructive sleep apnea (adult) (pediatric): Secondary | ICD-10-CM | POA: Insufficient documentation

## 2022-07-14 DIAGNOSIS — E669 Obesity, unspecified: Secondary | ICD-10-CM | POA: Diagnosis not present

## 2022-07-14 DIAGNOSIS — I4819 Other persistent atrial fibrillation: Secondary | ICD-10-CM | POA: Insufficient documentation

## 2022-07-14 DIAGNOSIS — I10 Essential (primary) hypertension: Secondary | ICD-10-CM | POA: Diagnosis not present

## 2022-07-14 DIAGNOSIS — I4891 Unspecified atrial fibrillation: Secondary | ICD-10-CM

## 2022-07-14 DIAGNOSIS — Z9981 Dependence on supplemental oxygen: Secondary | ICD-10-CM | POA: Insufficient documentation

## 2022-07-14 DIAGNOSIS — Z7901 Long term (current) use of anticoagulants: Secondary | ICD-10-CM | POA: Insufficient documentation

## 2022-07-14 DIAGNOSIS — D6869 Other thrombophilia: Secondary | ICD-10-CM | POA: Diagnosis not present

## 2022-07-14 DIAGNOSIS — Z79899 Other long term (current) drug therapy: Secondary | ICD-10-CM | POA: Insufficient documentation

## 2022-07-14 DIAGNOSIS — Z6841 Body Mass Index (BMI) 40.0 and over, adult: Secondary | ICD-10-CM | POA: Insufficient documentation

## 2022-07-14 LAB — COMPREHENSIVE METABOLIC PANEL
ALT: 10 U/L (ref 0–44)
AST: 21 U/L (ref 15–41)
Albumin: 2.2 g/dL — ABNORMAL LOW (ref 3.5–5.0)
Alkaline Phosphatase: 63 U/L (ref 38–126)
Anion gap: 11 (ref 5–15)
BUN: 11 mg/dL (ref 8–23)
CO2: 28 mmol/L (ref 22–32)
Calcium: 8 mg/dL — ABNORMAL LOW (ref 8.9–10.3)
Chloride: 101 mmol/L (ref 98–111)
Creatinine, Ser: 1.08 mg/dL (ref 0.61–1.24)
GFR, Estimated: >60 mL/min (ref >60)
Glucose, Bld: 115 mg/dL — ABNORMAL HIGH (ref 70–99)
Potassium: 3.3 mmol/L — ABNORMAL LOW (ref 3.5–5.1)
Sodium: 140 mmol/L (ref 135–145)
Total Bilirubin: 0.2 mg/dL — ABNORMAL LOW (ref 0.3–1.2)
Total Protein: 5.7 g/dL — ABNORMAL LOW (ref 6.5–8.1)

## 2022-07-14 LAB — TSH: TSH: 1.791 u[IU]/mL (ref 0.350–4.500)

## 2022-07-14 MED ORDER — POTASSIUM CHLORIDE CRYS ER 20 MEQ PO TBCR: 20.0000 meq | EXTENDED_RELEASE_TABLET | Freq: Two times a day (BID) | ORAL | 1 refills | Status: DC

## 2022-07-14 MED ORDER — AMIODARONE HCL 200 MG PO TABS: ORAL_TABLET | ORAL | 3 refills | Status: DC

## 2022-07-14 MED ORDER — MIDODRINE HCL 10 MG PO TABS: 10.0000 mg | ORAL_TABLET | Freq: Two times a day (BID) | ORAL | 3 refills | Status: DC

## 2022-07-14 MED ORDER — FUROSEMIDE 40 MG PO TABS: ORAL_TABLET | ORAL | 3 refills | Status: DC

## 2022-07-14 NOTE — Telephone Encounter (Signed)
Please prescribe furosemide 40 mg three times a week. Tell him thank you for the BP recordings: I think they are better, not as low as before. He is correct that the discontinuation of the amiloride is probably the reason for the fluid gain, but that medication was also lowering his BP. Furosemide should have less impact on BP.

## 2022-07-14 NOTE — Telephone Encounter (Signed)
Spoke with patient and informed him to start taking furosemide '40mg'$  tablet by mouth three times a week and to continue taking BP/P. He verbalized understanding. Order placed.

## 2022-07-14 NOTE — Progress Notes (Signed)
Primary Care Physician: [name], MD Primary Cardiologist: Dr Garen Lah Primary Electrophysiologist: none Referring Physician: Dr Brien Mates II is a 77 y.o. male with a history of chronic respiratory insufficiency on O2, OSA, venous insufficiency, recurrent bowel obstructions, SVT, atrial fibrillation who presents for consultation in the [facility].  The patient was initially diagnosed with atrial fibrillation 06/25/22 after presenting to Dr Croitoru's office to establish care. Patient was unaware of his arrhythmia but his heart rate was rapid and he was hypotensive. His BP medication was discontinued and he was started on digoxin. Patient was also started on Eliquis for a CHADS2VASC score of 4. His BP readings continued to be low and he was started on midodrine as well. Patient brings in a log today of his heart rates and BP readings. His heart rates are typically in the 90s and BP is 62G-BTD 176H systolic. He was started on Lasix x 3 days weekly today by Dr Sallyanne Kuster for edema and weight gain.   Today, he denies symptoms of palpitations, chest pain, orthopnea, PND, presyncope, syncope, bleeding, or neurologic sequela. The patient is tolerating medications without difficulties and is otherwise without complaint today.    Atrial Fibrillation Risk Factors:  he does have symptoms or diagnosis of sleep apnea. he is not compliant with BiPAP therapy. he does not have a history of rheumatic fever.   he has a BMI of Body mass index is 42.77 kg/m.Marland Kitchen Filed Weights   07/14/22 1039  Weight: (!) 147.1 kg    No family history on file.   Atrial Fibrillation Management history:  Previous antiarrhythmic drugs: none Previous cardioversions: none Previous ablations: none CHADS2VASC score: 4 Anticoagulation history: Eliquis   Past Medical History:  Diagnosis Date   Anemia    Arthritis    osteoarthritis - bilateral knees   Chronic venous  insufficiency of lower extremity    Dyspnea    GERD (gastroesophageal reflux disease)    Sleep apnea    Small bowel obstruction ([place]) 2022   Uses roller walker    Past Surgical History:  Procedure Laterality Date   CATARACT EXTRACTION W/PHACO Left 08/06/2020   Procedure: CATARACT EXTRACTION PHACO AND INTRAOCULAR LENS PLACEMENT ([place]) LEFT;  Surgeon: Eulogio Bear, MD;  Location: [facility];  Service: Ophthalmology;  Laterality: Left;  3.41 0:31.5   CATARACT EXTRACTION W/PHACO Right 08/27/2020   Procedure: CATARACT EXTRACTION PHACO AND INTRAOCULAR LENS PLACEMENT ([facility]) RIGHT 4.10 00:41.6;  Surgeon: Eulogio Bear, MD;  Location: [place];  Service: Ophthalmology;  Laterality: Right;   COLONOSCOPY WITH ESOPHAGOGASTRODUODENOSCOPY (EGD)     COLONOSCOPY WITH PROPOFOL N/A 11/01/2020   Procedure: COLONOSCOPY WITH PROPOFOL;  Surgeon: Lin Landsman, MD;  Location: [facility] ENDOSCOPY;  Service: Gastroenterology;  Laterality: N/A;   COLONOSCOPY WITH PROPOFOL N/A 03/18/2022   Procedure: COLONOSCOPY WITH BIOPSIES;  Surgeon: Lin Landsman, MD;  Location: [place];  Service: Endoscopy;  Laterality: N/A;   ESOPHAGOGASTRODUODENOSCOPY (EGD) WITH PROPOFOL N/A 11/01/2020   Procedure: ESOPHAGOGASTRODUODENOSCOPY (EGD) WITH PROPOFOL;  Surgeon: Lin Landsman, MD;  Location: [facility] ENDOSCOPY;  Service: Gastroenterology;  Laterality: N/A;   HERNIA REPAIR     inguinal and umbilical   POLYPECTOMY  03/18/2022   Procedure: POLYPECTOMY;  Surgeon: Lin Landsman, MD;  Location: [place];  Service: Endoscopy;;    Current Outpatient Medications  Medication Sig Dispense Refill   acetaminophen (TYLENOL) 325 MG tablet Take 325 mg by mouth every  8 (eight) hours as needed.     apixaban (ELIQUIS) 5 MG TABS tablet Take 1 tablet (5 mg total) by mouth 2 (two) times daily. 180 tablet 3   digoxin (LANOXIN) 0.125 MG tablet Take 1 tablet (0.125 mg total) by mouth daily. 90  tablet 3   levofloxacin (LEVAQUIN) 500 MG tablet Take 500 mg by mouth daily.     midodrine (PROAMATINE) 5 MG tablet Take 5 mg tablet by mouth  in the morning and take the 5 mg tablet  at least 4 to 5 hour later after the morning dose. 60 tablet 11   nystatin (MYCOSTATIN/NYSTOP) powder Apply 1 Application topically 3 (three) times daily. 60 g 6   pantoprazole (PROTONIX) 40 MG tablet Take 1 tablet (40 mg total) by mouth daily. 90 tablet 3   potassium chloride SA (KLOR-CON M) 20 MEQ tablet Take 1 tablet (20 mEq total) by mouth daily. 90 tablet 3   Probiotic Product (PROBIOTIC DAILY PO) Take by mouth.     No current facility-administered medications for this encounter.    No Known Allergies   Social History   Socioeconomic History   Marital status: Married    Spouse name: Not on file   Number of children: Not on file   Years of education: Not on file   Highest education level: Not on file  Occupational History   Not on file  Tobacco Use   Smoking status: Never   Smokeless tobacco: Never   Tobacco comments:    Never smoke 07/14/22  Substance and Sexual Activity   Alcohol use: Yes    Alcohol/week: 0.0 standard drinks of alcohol    Comment: rarely   Drug use: Not Currently   Sexual activity: Not on file  Other Topics Concern   Not on file  Social History Narrative   Not on file   Social Determinants of Health   Financial Resource Strain: Low Risk  (06/21/2021)   Overall Financial Resource Strain (CARDIA)    Difficulty of Paying Living Expenses: Not hard at all  Food Insecurity: No Food Insecurity (06/21/2021)   Hunger Vital Sign    Worried About Running Out of Food in the Last Year: Never true    Ran Out of Food in the Last Year: Never true  Transportation Needs: No Transportation Needs (06/21/2021)   PRAPARE - Hydrologist (Medical): No    Lack of Transportation (Non-Medical): No  Physical Activity: Inactive (06/21/2021)   Exercise Vital  Sign    Days of Exercise per Week: 0 days    Minutes of Exercise per Session: 0 min  Stress: No Stress Concern Present (06/21/2021)   [place]    Feeling of Stress : Not at all  Social Connections: Unknown (06/21/2021)   Social Connection and Isolation Panel [NHANES]    Frequency of Communication with Friends and Family: Twice a week    Frequency of Social Gatherings with Friends and Family: Never    Attends Religious Services: Patient refused    Marine scientist or Organizations: Patient refused    Attends Archivist Meetings: Patient refused    Marital Status: Married  Human resources officer Violence: Not At Risk (06/21/2021)   Humiliation, Afraid, Rape, and Kick questionnaire    Fear of Current or Ex-Partner: No    Emotionally Abused: No    Physically Abused: No    Sexually Abused: No     ROS-  All systems are reviewed and negative except as per the HPI above.  Physical Exam: Vitals:   07/14/22 1039  BP: (!) 76/68  Pulse: (!) 117  Weight: (!) 147.1 kg  Height: '6\' 1"'$  (1.854 m)    GEN- The patient is a well appearing obese elderly male, alert and oriented x 3 today.   Head- normocephalic, atraumatic Eyes-  Sclera clear, conjunctiva pink Ears- hearing intact Oropharynx- clear Neck- supple  Lungs- Clear to ausculation bilaterally, normal work of breathing, O2 nasal canula  Heart- irregular rate and rhythm, no murmurs, rubs or gallops  GI- soft, NT, ND, + BS Extremities- no clubbing, cyanosis, 1+ bilateral edema MS- no significant deformity or atrophy Skin- no rash or lesion Psych- euthymic mood, full affect Neuro- strength and sensation are intact  Wt Readings from Last 3 Encounters:  07/14/22 (!) 147.1 kg  06/25/22 (!) 142.6 kg  06/09/22 (!) 142.4 kg    EKG today demonstrates  Afib, PVC Vent. rate 117 BPM PR interval * ms QRS duration 76 ms QT/QTcB 314/438 ms  Echo 07/08/22  demonstrated   1. Challenging images.   2. Left ventricular ejection fraction, by estimation, is 60 to 65%. The  left ventricle has normal function. The left ventricle has no regional  wall motion abnormalities. Left ventricular diastolic parameters are  indeterminate.   3. Right ventricular systolic function is normal. The right ventricular  size is mildly enlarged.   4. The mitral valve is normal in structure. No evidence of mitral valve  regurgitation. No evidence of mitral stenosis.   5. The aortic valve was not well visualized. Aortic valve regurgitation  is not visualized. No aortic stenosis is present.   6. The inferior vena cava is normal in size with greater than 50%  respiratory variability, suggesting right atrial pressure of 3 mmHg.   7. Right atrial size was mildly dilated.   8. Unable to exclude abnormal rhythm, atrial fibrillation.   Comparison(s): 01/10/21 EF 50-55%.   Epic records are reviewed at length today  CHA2DS2-VASc Score = 4  The patient's score is based upon: CHF History: 1 HTN History: 1 Diabetes History: 0 Stroke History: 0 Vascular Disease History: 0 Age Score: 2 Gender Score: 0       ASSESSMENT AND PLAN: 1. Persistent Atrial Fibrillation (ICD10:  I48.19) The patient's CHA2DS2-VASc score is 4, indicating a 4.8% annual risk of stroke.   Patient remains in persistent afib, rates improved but still elevated. Will plan to start amiodarone 200 mg BID on 07/17/22 (3 weeks of anticoagulation) and then decrease to 200 mg daily in one month. Will stop digoxin the day before he starts amiodarone. Check cmet/TSH today Continue Eliquis 5 mg BID He may require DCCV after loading on amiodarone if he does not chemically convert. Recommend ECG in two weeks.   2. Secondary Hypercoagulable State (ICD10:  D68.69) The patient is at significant risk for stroke/thromboembolism based upon his CHA2DS2-VASc Score of 4.  Continue Apixaban (Eliquis).   3. Obesity Body  mass index is 42.77 kg/m. Lifestyle modification was discussed at length including regular exercise and weight reduction.  4. Obstructive sleep apnea The importance of adequate treatment of sleep apnea was discussed today in order to improve our ability to maintain sinus rhythm long term. He is intolerant of BiPap, suspect this is significantly contributing to his arrhythmias.   5. HTN Off HTN medications, BP low D/w Dr Sallyanne Kuster, increase midodrine to 10 mg BID   Follow up with Dr  Agbor-Etang as scheduled. Patient would like all of his future care in [place]. Will refer to establish with Dr Quentin Ore there.    [facility] [address] [place], [place] [address] [phone] 07/14/2022 11:14 AM

## 2022-07-14 NOTE — Patient Instructions (Signed)
On Thursday, January 25th - Start Amiodarone '200mg'$  twice a day (with food) for one month then reduce to once a day  STOP digoxin on Thursday   EKG in [place] in 2 weeks - their office will call with appointment for this.

## 2022-07-18 ENCOUNTER — Encounter: Payer: Self-pay | Admitting: Cardiology

## 2022-07-18 ENCOUNTER — Ambulatory Visit: Payer: Medicare Other | Attending: Cardiology | Admitting: Cardiology

## 2022-07-18 ENCOUNTER — Other Ambulatory Visit
Admission: RE | Admit: 2022-07-18 | Discharge: 2022-07-18 | Disposition: A | Discharge status: Home / Self Care | Payer: Medicare Other | Source: Ambulatory Visit | Attending: Cardiology | Admitting: Cardiology

## 2022-07-18 ENCOUNTER — Other Ambulatory Visit (HOSPITAL_COMMUNITY): Payer: Self-pay | Admitting: Physician Assistant

## 2022-07-18 ENCOUNTER — Other Ambulatory Visit (HOSPITAL_COMMUNITY): Payer: Self-pay | Admitting: Cardiovascular Disease

## 2022-07-18 VITALS — BP 86/64 | HR 117 | Ht 73.0 in | Wt 324.0 lb

## 2022-07-18 DIAGNOSIS — I4819 Other persistent atrial fibrillation: Secondary | ICD-10-CM | POA: Diagnosis not present

## 2022-07-18 DIAGNOSIS — I950 Idiopathic hypotension: Secondary | ICD-10-CM | POA: Diagnosis not present

## 2022-07-18 LAB — CBC
HCT: 42.6 % (ref 39.0–52.0)
Hemoglobin: 13.4 g/dL (ref 13.0–17.0)
MCH: 25.8 pg — ABNORMAL LOW (ref 26.0–34.0)
MCHC: 31.5 g/dL (ref 30.0–36.0)
MCV: 82.1 fL (ref 80.0–100.0)
Platelets: 300 10*3/uL (ref 150–400)
RBC: 5.19 MIL/uL (ref 4.22–5.81)
RDW: 16.3 % — ABNORMAL HIGH (ref 11.5–15.5)
WBC: 15.6 10*3/uL — ABNORMAL HIGH (ref 4.0–10.5)
nRBC: 0 % (ref 0.0–0.2)

## 2022-07-18 LAB — BASIC METABOLIC PANEL
Anion gap: 13 (ref 5–15)
BUN: 11 mg/dL (ref 8–23)
CO2: 25 mmol/L (ref 22–32)
Calcium: 8.1 mg/dL — ABNORMAL LOW (ref 8.9–10.3)
Chloride: 102 mmol/L (ref 98–111)
Creatinine, Ser: 0.86 mg/dL (ref 0.61–1.24)
GFR, Estimated: >60 mL/min (ref >60)
Glucose, Bld: 91 mg/dL (ref 70–99)
Potassium: 3.1 mmol/L — ABNORMAL LOW (ref 3.5–5.1)
Sodium: 140 mmol/L (ref 135–145)

## 2022-07-18 MED ORDER — MIDODRINE HCL 10 MG PO TABS: 10.0000 mg | ORAL_TABLET | Freq: Three times a day (TID) | ORAL | 3 refills | Status: DC

## 2022-07-18 MED ORDER — POTASSIUM CHLORIDE CRYS ER 20 MEQ PO TBCR: 20.0000 meq | EXTENDED_RELEASE_TABLET | ORAL | 3 refills | Status: DC

## 2022-07-18 NOTE — Progress Notes (Signed)
Cardiology Office Note:    Date:  07/18/2022   ID:  Ernest [name], DOB [birth year], MRN [id number]  PCP:  [name], [place] Providers Cardiologist:  Kate Sable, MD     Referring MD: [name], MD   Chief Complaint  Patient presents with   Follow-up    Testing F/U     History of Present Illness:    Ernest [name] is a 77 y.o. male with a hx of atrial fibrillation, hypertension, morbid obesity, sleep apnea, chronic respiratory failure on home oxygen presenting to establish care.  Previously seen at our [facility] office, noted to be in A-fib RVR, started on digoxin due to low blood pressures.  Eliquis 5 mg twice daily was started.  Evaluated at the A-fib clinic, digoxin was stopped, amiodarone started.  Endorses dizziness, shortness of breath and weight gain.  Started on Lasix 40 mg 3 times daily, KCl was also started.  Blood pressures have been low, running in the 21H to 08M systolic, midodrine 10 mg twice daily was started.  Did not tolerate CPAP, not candidate for inspire device due to weight.  Echocardiogram 06/2022 showed normal EF 60 to 65%,  Past Medical History:  Diagnosis Date   Anemia    Arthritis    osteoarthritis - bilateral knees   Chronic venous insufficiency of lower extremity    Dyspnea    GERD (gastroesophageal reflux disease)    Sleep apnea    Small bowel obstruction ([place]) 2022   Uses roller walker     Past Surgical History:  Procedure Laterality Date   CATARACT EXTRACTION W/PHACO Left 08/06/2020   Procedure: CATARACT EXTRACTION PHACO AND INTRAOCULAR LENS PLACEMENT ([place]) LEFT;  Surgeon: Eulogio Bear, MD;  Location: [place];  Service: Ophthalmology;  Laterality: Left;  3.41 0:31.5   CATARACT EXTRACTION W/PHACO Right 08/27/2020   Procedure: CATARACT EXTRACTION PHACO AND INTRAOCULAR LENS PLACEMENT ([facility]) RIGHT 4.10 00:41.6;  Surgeon: Eulogio Bear, MD;  Location: [place];  Service:  Ophthalmology;  Laterality: Right;   COLONOSCOPY WITH ESOPHAGOGASTRODUODENOSCOPY (EGD)     COLONOSCOPY WITH PROPOFOL N/A 11/01/2020   Procedure: COLONOSCOPY WITH PROPOFOL;  Surgeon: Lin Landsman, MD;  Location: [facility] ENDOSCOPY;  Service: Gastroenterology;  Laterality: N/A;   COLONOSCOPY WITH PROPOFOL N/A 03/18/2022   Procedure: COLONOSCOPY WITH BIOPSIES;  Surgeon: Lin Landsman, MD;  Location: [place];  Service: Endoscopy;  Laterality: N/A;   ESOPHAGOGASTRODUODENOSCOPY (EGD) WITH PROPOFOL N/A 11/01/2020   Procedure: ESOPHAGOGASTRODUODENOSCOPY (EGD) WITH PROPOFOL;  Surgeon: Lin Landsman, MD;  Location: [facility] ENDOSCOPY;  Service: Gastroenterology;  Laterality: N/A;   HERNIA REPAIR     inguinal and umbilical   POLYPECTOMY  03/18/2022   Procedure: POLYPECTOMY;  Surgeon: Lin Landsman, MD;  Location: [place];  Service: Endoscopy;;    Current Medications: Current Meds  Medication Sig   acetaminophen (TYLENOL) 325 MG tablet Take 325 mg by mouth every 8 (eight) hours as needed.   apixaban (ELIQUIS) 5 MG TABS tablet Take 1 tablet (5 mg total) by mouth 2 (two) times daily.   furosemide (LASIX) 40 MG tablet Take one tablet ('40mg'$ ) three times a week.   nystatin (MYCOSTATIN/NYSTOP) powder Apply 1 Application topically 3 (three) times daily.   pantoprazole (PROTONIX) 40 MG tablet Take 1 tablet (40 mg total) by mouth daily.   Probiotic Product (PROBIOTIC DAILY PO) Take by mouth.   traMADol (ULTRAM) 50 MG tablet Take 50 mg by  mouth every 6 (six) hours as needed for moderate pain.   [DISCONTINUED] amiodarone (PACERONE) 200 MG tablet Take 1 tablet (200 mg total) by mouth 2 (two) times daily for 30 days, THEN 1 tablet (200 mg total) daily.   [DISCONTINUED] midodrine (PROAMATINE) 10 MG tablet Take 1 tablet (10 mg total) by mouth 2 (two) times daily with a meal.   [DISCONTINUED] potassium chloride SA (KLOR-CON M) 20 MEQ tablet Take 1 tablet (20 mEq total) by mouth 2  (two) times daily.     Allergies:   Patient has no known allergies.   Social History   Socioeconomic History   Marital status: Married    Spouse name: Not on file   Number of children: Not on file   Years of education: Not on file   Highest education level: Not on file  Occupational History   Not on file  Tobacco Use   Smoking status: Never   Smokeless tobacco: Never   Tobacco comments:    Never smoke 07/14/22  Substance and Sexual Activity   Alcohol use: Yes    Alcohol/week: 0.0 standard drinks of alcohol    Comment: rarely   Drug use: Not Currently   Sexual activity: Not on file  Other Topics Concern   Not on file  Social History Narrative   Not on file   Social Determinants of Health   Financial Resource Strain: Low Risk  (06/21/2021)   Overall Financial Resource Strain (CARDIA)    Difficulty of Paying Living Expenses: Not hard at all  Food Insecurity: No Food Insecurity (06/21/2021)   Hunger Vital Sign    Worried About Running Out of Food in the Last Year: Never true    Ran Out of Food in the Last Year: Never true  Transportation Needs: No Transportation Needs (06/21/2021)   PRAPARE - Hydrologist (Medical): No    Lack of Transportation (Non-Medical): No  Physical Activity: Inactive (06/21/2021)   Exercise Vital Sign    Days of Exercise per Week: 0 days    Minutes of Exercise per Session: 0 min  Stress: No Stress Concern Present (06/21/2021)   [place]    Feeling of Stress : Not at all  Social Connections: Unknown (06/21/2021)   Social Connection and Isolation Panel [NHANES]    Frequency of Communication with Friends and Family: Twice a week    Frequency of Social Gatherings with Friends and Family: Never    Attends Religious Services: Patient refused    Marine scientist or Organizations: Patient refused    Attends Archivist Meetings: Patient  refused    Marital Status: Married     Family History: The patient's family history is not on file.  ROS:   Please see the history of present illness.     All other systems reviewed and are negative.  EKGs/Labs/Other Studies Reviewed:    The following studies were reviewed today:   EKG:  EKG is  ordered today.  The ekg ordered today demonstrates   Recent Labs: 07/04/2022: BNP 158.7 07/14/2022: ALT 10; BUN 11; Creatinine, Ser 1.08; Potassium 3.3; Sodium 140; TSH 1.791  Recent Lipid Panel No results found for: "CHOL", "TRIG", "HDL", "CHOLHDL", "VLDL", "LDLCALC", "LDLDIRECT"   Risk Assessment/Calculations:             Physical Exam:    VS:  BP (!) 86/64 (BP Location: Right Arm, Patient Position: Sitting, Cuff Size: Large)  Pulse (!) 117   Ht '6\' 1"'$  (1.854 m)   Wt (!) 324 lb (147 kg)   SpO2 98% Comment: 4 liters  BMI 42.75 kg/m     Wt Readings from Last 3 Encounters:  07/18/22 (!) 324 lb (147 kg)  07/14/22 (!) 324 lb 3.2 oz (147.1 kg)  06/25/22 (!) 314 lb 6.4 oz (142.6 kg)     GEN:  Well nourished, mild respiratory distress, obese HEENT: Normal NECK: No JVD; No carotid bruits CARDIAC: Irregular irregular RESPIRATORY: Diminished breath sounds, no wheezing, no rhonchi ABDOMEN: Soft, non-tender, distended MUSCULOSKELETAL:  No edema; No deformity  SKIN: Warm and dry NEUROLOGIC:  Alert and oriented x 3 PSYCHIATRIC:  Normal affect   ASSESSMENT:    1. Persistent atrial fibrillation ([place])   2. Idiopathic hypotension   3. Morbid obesity ([place])    PLAN:    In order of problems listed above:  Persistent A-fib, continue amiodarone, Eliquis.  Plan DC cardioversion in 3 weeks.  Echo with normal EF, normal LA size. Hypotension, systolic 15A today.  Increase midodrine to 10 mg 3 times daily. Morbid obesity, weight loss low calorie diet recommended.       Follow-up in 6 weeks   Medication Adjustments/Labs and Tests Ordered: Current medicines are reviewed at length  with the patient today.  Concerns regarding medicines are outlined above.  Orders Placed This Encounter  Procedures   EKG 12-Lead   Meds ordered this encounter  Medications   midodrine (PROAMATINE) 10 MG tablet    Sig: Take 1 tablet (10 mg total) by mouth 3 (three) times daily.    Dispense:  90 tablet    Refill:  3    Dose increase   potassium chloride SA (KLOR-CON M) 20 MEQ tablet    Sig: Take 1 tablet (20 mEq total) by mouth 3 (three) times a week.    Dispense:  13 tablet    Refill:  3    Dose increase    Patient Instructions  Medication Instructions:   INCREASE Midodrine - take one tablet (10 mg) by mouth three times day. Potassium - take your potassium 3 times a week.   *If you need a refill on your cardiac medications before your next appointment, please call your pharmacy*   Lab Work:  Your physician recommends you get lab work done - BMP / CBC  If you have labs (blood work) drawn today and your tests are completely normal, you will receive your results only by: [place] (if you have MyChart) OR A paper copy in the mail If you have any lab test that is abnormal or we need to change your treatment, we will call you to review the results.   Testing/Procedures:  You are scheduled for a Cardioversion on February 7th with Dr.__Agbor-Etang______ Please arrive at the [place] [facility] at ____7:30_____ a.m. on the day of your procedure.  DIET INSTRUCTIONS:  Nothing to eat or drink after midnight except your medications with a safe sip of water.         Labs: ___Today_______________ Medications:  YOU MAY TAKE ALL of your remaining medications with a small amount of water. Must have a responsible person to drive you home Bring a current list of your medications and current insurance cards.    If you have any questions after you get home, please call the office at [phone]    Follow-Up: At [facility], you and your health needs are our  priority.  As part of our continuing mission to provide you with exceptional heart care, we have created designated Provider Care Teams.  These Care Teams include your primary Cardiologist (physician) and Advanced Practice Providers (APPs -  Physician Assistants and Nurse Practitioners) who all work together to provide you with the care you need, when you need it.  We recommend signing up for the patient portal called "MyChart".  Sign up information is provided on this After Visit Summary.  MyChart is used to connect with patients for Virtual Visits (Telemedicine).  Patients are able to view lab/test results, encounter notes, upcoming appointments, etc.  Non-urgent messages can be sent to your provider as well.   To learn more about what you can do with MyChart, go to [email or web address].    Your next appointment:   6 week(s)  Provider:   You may see Kate Sable, MD or one of the following Advanced Practice Providers on your designated Care Team:   Murray Hodgkins, NP Christell Faith, PA-C Cadence Kathlen Mody, PA-C Gerrie Nordmann, NP   Signed, Kate Sable, MD  07/18/2022 12:09 PM    [place]

## 2022-07-18 NOTE — Addendum Note (Signed)
Addended by: Janan Ridge on: 07/18/2022 12:17 PM   Modules accepted: Orders

## 2022-07-18 NOTE — Patient Instructions (Signed)
Medication Instructions:   INCREASE Midodrine - take one tablet (10 mg) by mouth three times day. Potassium - take your potassium 3 times a week.   *If you need a refill on your cardiac medications before your next appointment, please call your pharmacy*   Lab Work:  Your physician recommends you get lab work done - BMP / CBC  If you have labs (blood work) drawn today and your tests are completely normal, you will receive your results only by: [place] (if you have MyChart) OR A paper copy in the mail If you have any lab test that is abnormal or we need to change your treatment, we will call you to review the results.   Testing/Procedures:  You are scheduled for a Cardioversion on February 7th with Dr.__Agbor-Etang______ Please arrive at the [place] [facility] at ____7:30_____ a.m. on the day of your procedure.  DIET INSTRUCTIONS:  Nothing to eat or drink after midnight except your medications with a safe sip of water.         Labs: ___Today_______________ Medications:  YOU MAY TAKE ALL of your remaining medications with a small amount of water. Must have a responsible person to drive you home Bring a current list of your medications and current insurance cards.    If you have any questions after you get home, please call the office at [phone]    Follow-Up: At [facility], you and your health needs are our priority.  As part of our continuing mission to provide you with exceptional heart care, we have created designated Provider Care Teams.  These Care Teams include your primary Cardiologist (physician) and Advanced Practice Providers (APPs -  Physician Assistants and Nurse Practitioners) who all work together to provide you with the care you need, when you need it.  We recommend signing up for the patient portal called "MyChart".  Sign up information is provided on this After Visit Summary.  MyChart is used to connect with patients for Virtual Visits  (Telemedicine).  Patients are able to view lab/test results, encounter notes, upcoming appointments, etc.  Non-urgent messages can be sent to your provider as well.   To learn more about what you can do with MyChart, go to [email or web address].    Your next appointment:   6 week(s)  Provider:   You may see Kate Sable, MD or one of the following Advanced Practice Providers on your designated Care Team:   Murray Hodgkins, NP Christell Faith, PA-C Cadence Kathlen Mody, PA-C Gerrie Nordmann, NP

## 2022-07-19 ENCOUNTER — Other Ambulatory Visit: Payer: Self-pay | Admitting: Internal Medicine

## 2022-07-21 ENCOUNTER — Telehealth: Payer: Self-pay

## 2022-07-21 DIAGNOSIS — I4891 Unspecified atrial fibrillation: Secondary | ICD-10-CM

## 2022-07-21 NOTE — Telephone Encounter (Signed)
-----  Message from Kate Sable, MD sent at 07/18/2022  1:07 PM EST ----- Potassium low. Take kcl 3mq 3x weekly with lasix as prescribed. Please repeat bmp in 1 week

## 2022-07-21 NOTE — Telephone Encounter (Signed)
The patient has been notified of the result and verbalized understanding.  All questions (if any) were answered. Janan Ridge, [place] 07/21/2022 9:44 AM    Orders placed.

## 2022-07-25 ENCOUNTER — Inpatient Hospital Stay
Admission: EM | Admit: 2022-07-25 | Discharge: 2022-08-01 | DRG: 092 | Disposition: A | Discharge status: Skilled Nursing Facility | Payer: Medicare Other | Attending: Internal Medicine | Admitting: Internal Medicine

## 2022-07-25 ENCOUNTER — Encounter: Payer: Self-pay | Admitting: Cardiology

## 2022-07-25 DIAGNOSIS — I4819 Other persistent atrial fibrillation: Secondary | ICD-10-CM | POA: Diagnosis not present

## 2022-07-25 DIAGNOSIS — G90A Postural orthostatic tachycardia syndrome (POTS): Secondary | ICD-10-CM | POA: Diagnosis not present

## 2022-07-25 DIAGNOSIS — I5032 Chronic diastolic (congestive) heart failure: Secondary | ICD-10-CM | POA: Diagnosis not present

## 2022-07-25 DIAGNOSIS — Z743 Need for continuous supervision: Secondary | ICD-10-CM | POA: Diagnosis not present

## 2022-07-25 DIAGNOSIS — Z7901 Long term (current) use of anticoagulants: Secondary | ICD-10-CM | POA: Diagnosis not present

## 2022-07-25 DIAGNOSIS — I951 Orthostatic hypotension: Secondary | ICD-10-CM | POA: Diagnosis present

## 2022-07-25 DIAGNOSIS — E876 Hypokalemia: Secondary | ICD-10-CM | POA: Diagnosis present

## 2022-07-25 DIAGNOSIS — I9589 Other hypotension: Secondary | ICD-10-CM

## 2022-07-25 DIAGNOSIS — R0689 Other abnormalities of breathing: Secondary | ICD-10-CM | POA: Diagnosis not present

## 2022-07-25 DIAGNOSIS — R55 Syncope and collapse: Secondary | ICD-10-CM

## 2022-07-25 DIAGNOSIS — Z6841 Body Mass Index (BMI) 40.0 and over, adult: Secondary | ICD-10-CM

## 2022-07-25 DIAGNOSIS — E869 Volume depletion, unspecified: Secondary | ICD-10-CM | POA: Diagnosis present

## 2022-07-25 DIAGNOSIS — Z79899 Other long term (current) drug therapy: Secondary | ICD-10-CM

## 2022-07-25 DIAGNOSIS — R197 Diarrhea, unspecified: Secondary | ICD-10-CM | POA: Diagnosis not present

## 2022-07-25 DIAGNOSIS — Z66 Do not resuscitate: Secondary | ICD-10-CM | POA: Diagnosis not present

## 2022-07-25 DIAGNOSIS — K219 Gastro-esophageal reflux disease without esophagitis: Secondary | ICD-10-CM | POA: Diagnosis present

## 2022-07-25 DIAGNOSIS — I959 Hypotension, unspecified: Secondary | ICD-10-CM

## 2022-07-25 DIAGNOSIS — I499 Cardiac arrhythmia, unspecified: Secondary | ICD-10-CM | POA: Diagnosis not present

## 2022-07-25 DIAGNOSIS — I878 Other specified disorders of veins: Secondary | ICD-10-CM | POA: Diagnosis present

## 2022-07-25 DIAGNOSIS — R404 Transient alteration of awareness: Secondary | ICD-10-CM | POA: Diagnosis not present

## 2022-07-25 DIAGNOSIS — R6889 Other general symptoms and signs: Secondary | ICD-10-CM | POA: Diagnosis not present

## 2022-07-25 DIAGNOSIS — E662 Morbid (severe) obesity with alveolar hypoventilation: Secondary | ICD-10-CM | POA: Diagnosis present

## 2022-07-25 DIAGNOSIS — I110 Hypertensive heart disease with heart failure: Secondary | ICD-10-CM | POA: Diagnosis not present

## 2022-07-25 DIAGNOSIS — M170 Bilateral primary osteoarthritis of knee: Secondary | ICD-10-CM | POA: Diagnosis present

## 2022-07-25 DIAGNOSIS — I4510 Unspecified right bundle-branch block: Secondary | ICD-10-CM | POA: Diagnosis present

## 2022-07-25 DIAGNOSIS — J9611 Chronic respiratory failure with hypoxia: Secondary | ICD-10-CM | POA: Diagnosis present

## 2022-07-25 DIAGNOSIS — Z9981 Dependence on supplemental oxygen: Secondary | ICD-10-CM

## 2022-07-25 HISTORY — DX: Other ill-defined heart diseases: I51.89

## 2022-07-25 HISTORY — DX: Other persistent atrial fibrillation: I48.19

## 2022-07-25 HISTORY — DX: Essential (primary) hypertension: I10

## 2022-07-25 HISTORY — DX: Hypotension, unspecified: I95.9

## 2022-07-25 HISTORY — DX: Morbid (severe) obesity with alveolar hypoventilation: E66.2

## 2022-07-25 LAB — COMPREHENSIVE METABOLIC PANEL
ALT: 9 U/L (ref 0–44)
AST: 19 U/L (ref 15–41)
Albumin: 2.4 g/dL — ABNORMAL LOW (ref 3.5–5.0)
Alkaline Phosphatase: 71 U/L (ref 38–126)
Anion gap: 12 (ref 5–15)
BUN: 17 mg/dL (ref 8–23)
CO2: 31 mmol/L (ref 22–32)
Calcium: 7.8 mg/dL — ABNORMAL LOW (ref 8.9–10.3)
Chloride: 93 mmol/L — ABNORMAL LOW (ref 98–111)
Creatinine, Ser: 1.08 mg/dL (ref 0.61–1.24)
GFR, Estimated: >60 mL/min (ref >60)
Glucose, Bld: 104 mg/dL — ABNORMAL HIGH (ref 70–99)
Potassium: 3.2 mmol/L — ABNORMAL LOW (ref 3.5–5.1)
Sodium: 136 mmol/L (ref 135–145)
Total Bilirubin: 1.3 mg/dL — ABNORMAL HIGH (ref 0.3–1.2)
Total Protein: 5.8 g/dL — ABNORMAL LOW (ref 6.5–8.1)

## 2022-07-25 LAB — CBC
HCT: 40.8 % (ref 39.0–52.0)
Hemoglobin: 13.1 g/dL (ref 13.0–17.0)
MCH: 25.9 pg — ABNORMAL LOW (ref 26.0–34.0)
MCHC: 32.1 g/dL (ref 30.0–36.0)
MCV: 80.8 fL (ref 80.0–100.0)
Platelets: 350 10*3/uL (ref 150–400)
RBC: 5.05 MIL/uL (ref 4.22–5.81)
RDW: 16.2 % — ABNORMAL HIGH (ref 11.5–15.5)
WBC: 11.7 10*3/uL — ABNORMAL HIGH (ref 4.0–10.5)
nRBC: 0 % (ref 0.0–0.2)

## 2022-07-25 LAB — TROPONIN I (HIGH SENSITIVITY): Troponin I (High Sensitivity): 4 ng/L (ref <18)

## 2022-07-25 LAB — LIPASE, BLOOD: Lipase: 30 U/L (ref 11–51)

## 2022-07-25 MED ORDER — POTASSIUM CHLORIDE 10 MEQ/100ML IV SOLN
10.0000 meq | INTRAVENOUS | Status: AC
  Administered 2022-07-25 (×2): 10 meq via INTRAVENOUS
  Filled 2022-07-25 (×2): qty 100

## 2022-07-25 MED ORDER — SODIUM CHLORIDE 0.9 % IV BOLUS
500.0000 mL | Freq: Once | INTRAVENOUS | Status: AC
  Administered 2022-07-25: 500 mL via INTRAVENOUS

## 2022-07-25 NOTE — ED Triage Notes (Addendum)
Pt arrives via EMS. Pt is from home, per EMS pt has been having near syncopal episodes all day today. Pt has had a GI illness and have had diarrhea x5 days. Per EMS they gave pt 427m of NS via 20g IV in [place]

## 2022-07-25 NOTE — ED Provider Notes (Signed)
[facility] Provider Note    Event Date/Time   First MD Initiated Contact with Patient 07/25/22 2106     (approximate)   History   Hypotension   HPI  Ernest [name] is a 77 y.o. male  who presents to the emergency department today because of concern for near syncopal episodes. The patient states that he has a history of hypotension and is on medication for it. He occasionally gets near syncope type episodes however had multiple today. Denies any chest pain or palpitations. Does have history of atrial fibrillation, states he is scheduled for ablation next week. Also states he has history of bacterial gut overgrowth.       Physical Exam   Triage Vital Signs: ED Triage Vitals  Enc Vitals Group     BP 07/25/22 2058 (!) 87/61     Pulse Rate 07/25/22 2058 88     Resp 07/25/22 2058 (!) 25     Temp 07/25/22 2058 98.1 F (36.7 C)     Temp Source 07/25/22 2058 Oral     SpO2 07/25/22 2058 98 %     Weight 07/25/22 2055 (!) 305 lb (138.3 kg)     Height --      Head Circumference --      Peak Flow --      Pain Score 07/25/22 2055 0     Pain Loc --      Pain Edu? --      Excl. in [place]? --     Most recent vital signs: Vitals:   07/25/22 2058  BP: (!) 87/61  Pulse: 88  Resp: (!) 25  Temp: 98.1 F (36.7 C)  SpO2: 98%    General: Awake, alert, oriented. CV:  Good peripheral perfusion. Irregular rhythm. Resp:  Normal effort. Lungs clear. Abd:  No distention. Non tender.   ED Results / Procedures / Treatments   Labs (all labs ordered are listed, but only abnormal results are displayed) Labs Reviewed  COMPREHENSIVE METABOLIC PANEL - Abnormal; Notable for the following components:      Result Value   Potassium 3.2 (*)    Chloride 93 (*)    Glucose, Bld 104 (*)    Calcium 7.8 (*)    Total Protein 5.8 (*)    Albumin 2.4 (*)    Total Bilirubin 1.3 (*)    All other components within normal limits  CBC - Abnormal; Notable for the following  components:   WBC 11.7 (*)    MCH 25.9 (*)    RDW 16.2 (*)    All other components within normal limits  LIPASE, BLOOD  URINALYSIS, ROUTINE W REFLEX MICROSCOPIC  TROPONIN I (HIGH SENSITIVITY)     EKG  I, Nance Pear, attending physician, personally viewed and interpreted this EKG  EKG Time: 2100 Rate: 90 Rhythm: atrial fibrillation Axis: normal Intervals: qtc 527 QRS: incomplete RBBB ST changes: no st elevation Impression: abnormal ekg   RADIOLOGY None   PROCEDURES:  Critical Care performed: No  Procedures   MEDICATIONS ORDERED IN ED: Medications - No data to display   IMPRESSION / MDM / [place] / ED COURSE  I reviewed the triage vital signs and the nursing notes.                              Differential diagnosis includes, but is not limited to, dehydration, infection, acs  Patient's presentation is  most consistent with acute presentation with potential threat to life or bodily function.   Patient presented to the emergency department today because of concerns for near syncopal episodes.  Patient is hypotensive here in the emergency department.  He does have history of hypertension.  Will give fluids in case he is somewhat more hypotensive due to dehydration.  Additionally will check blood work and urine.  Blood work without concerning leukocytosis, anemia.  No concerning electrolyte abnormalities.  Troponin was negative.  Awaiting urine at time of signout.  FINAL CLINICAL IMPRESSION(S) / ED DIAGNOSES   Final diagnoses:  Near syncope  Hypotension, unspecified hypotension type      Note:  This document was prepared using Dragon voice recognition software and may include unintentional dictation errors.    Nance Pear, MD 07/25/22 [phone]

## 2022-07-25 NOTE — ED Provider Notes (Signed)
Care assumed of patient from outgoing provider.  See their note for initial history, exam and plan.  Clinical Course as of 07/25/22 2310  Fri Jul 25, 2022  2304 History of hypotension on midodrine, known history of a fib on [facility] and plan for ablation.  Syncopal episodes recurrent - given fluids and re-eval.  Waiting for urine.  [SM]    Clinical Course User Index [SM] Nathaniel Man, MD   Patient with recurrent episodes of syncope but states that it has been significantly worse today and has had 4-5 episodes of near syncope.  Diarrhea throughout the day.  States that he was having near syncopal episode but they had improved after he was started on midodrine approximately 2 weeks ago.  States that his blood pressure has been significantly lower today than it has been and is similar to whenever he was put on midodrine.  Has not missed any doses of his midodrine.   Reevaluation patient continues to be hypotensive with a MAP of 62.  States he still feels poorly.  UA without signs of an obvious urinary tract infection.  Does state that he has had ongoing diarrhea that has been watery but only for 1 day.  No recent antibiotic use so have a lower suspicion for C. difficile.  Unable to provide a stool sample while in the emergency department.  Given 500 bolus of IV fluids.  Likely secondary to dehydration given his diarrhea.  Did not miss any of his daily medications including his home midodrine.  .Critical Care  Performed by: Nathaniel Man, MD Authorized by: Nathaniel Man, MD   Critical care provider statement:    Critical care time (minutes):  30   Critical care time was exclusive of:  Separately billable procedures and treating other patients   Critical care was necessary to treat or prevent imminent or life-threatening deterioration of the following conditions:  Dehydration   Critical care was time spent personally by me on the following activities:  Development of treatment plan with patient or  surrogate, discussions with consultants, evaluation of patient's response to treatment, examination of patient, ordering and review of laboratory studies, ordering and review of radiographic studies, ordering and performing treatments and interventions, pulse oximetry, re-evaluation of patient's condition and review of old charts    Nathaniel Man, MD 07/25/22 2310    Nathaniel Man, MD 07/26/22 0040

## 2022-07-26 ENCOUNTER — Observation Stay: Payer: Medicare Other

## 2022-07-26 ENCOUNTER — Other Ambulatory Visit: Payer: Self-pay

## 2022-07-26 ENCOUNTER — Encounter: Payer: Self-pay | Admitting: Internal Medicine

## 2022-07-26 DIAGNOSIS — I951 Orthostatic hypotension: Secondary | ICD-10-CM

## 2022-07-26 DIAGNOSIS — Z7901 Long term (current) use of anticoagulants: Secondary | ICD-10-CM

## 2022-07-26 DIAGNOSIS — J9611 Chronic respiratory failure with hypoxia: Secondary | ICD-10-CM

## 2022-07-26 DIAGNOSIS — R55 Syncope and collapse: Secondary | ICD-10-CM | POA: Diagnosis not present

## 2022-07-26 DIAGNOSIS — G4730 Sleep apnea, unspecified: Secondary | ICD-10-CM | POA: Diagnosis not present

## 2022-07-26 DIAGNOSIS — I471 Supraventricular tachycardia, unspecified: Secondary | ICD-10-CM | POA: Diagnosis not present

## 2022-07-26 DIAGNOSIS — F1090 Alcohol use, unspecified, uncomplicated: Secondary | ICD-10-CM | POA: Diagnosis not present

## 2022-07-26 DIAGNOSIS — I959 Hypotension, unspecified: Secondary | ICD-10-CM | POA: Diagnosis not present

## 2022-07-26 DIAGNOSIS — K580 Irritable bowel syndrome with diarrhea: Secondary | ICD-10-CM | POA: Diagnosis not present

## 2022-07-26 DIAGNOSIS — Z79899 Other long term (current) drug therapy: Secondary | ICD-10-CM | POA: Diagnosis not present

## 2022-07-26 DIAGNOSIS — M170 Bilateral primary osteoarthritis of knee: Secondary | ICD-10-CM | POA: Diagnosis not present

## 2022-07-26 DIAGNOSIS — R197 Diarrhea, unspecified: Secondary | ICD-10-CM

## 2022-07-26 DIAGNOSIS — R6889 Other general symptoms and signs: Secondary | ICD-10-CM | POA: Diagnosis not present

## 2022-07-26 DIAGNOSIS — G4733 Obstructive sleep apnea (adult) (pediatric): Secondary | ICD-10-CM | POA: Diagnosis not present

## 2022-07-26 DIAGNOSIS — Z9981 Dependence on supplemental oxygen: Secondary | ICD-10-CM | POA: Diagnosis not present

## 2022-07-26 DIAGNOSIS — I9589 Other hypotension: Secondary | ICD-10-CM | POA: Diagnosis not present

## 2022-07-26 DIAGNOSIS — K219 Gastro-esophageal reflux disease without esophagitis: Secondary | ICD-10-CM | POA: Diagnosis not present

## 2022-07-26 DIAGNOSIS — Z6841 Body Mass Index (BMI) 40.0 and over, adult: Secondary | ICD-10-CM | POA: Diagnosis not present

## 2022-07-26 DIAGNOSIS — Z66 Do not resuscitate: Secondary | ICD-10-CM | POA: Diagnosis present

## 2022-07-26 DIAGNOSIS — I4510 Unspecified right bundle-branch block: Secondary | ICD-10-CM | POA: Diagnosis present

## 2022-07-26 DIAGNOSIS — E876 Hypokalemia: Secondary | ICD-10-CM

## 2022-07-26 DIAGNOSIS — I872 Venous insufficiency (chronic) (peripheral): Secondary | ICD-10-CM | POA: Diagnosis not present

## 2022-07-26 DIAGNOSIS — E662 Morbid (severe) obesity with alveolar hypoventilation: Secondary | ICD-10-CM | POA: Diagnosis present

## 2022-07-26 DIAGNOSIS — I10 Essential (primary) hypertension: Secondary | ICD-10-CM | POA: Diagnosis not present

## 2022-07-26 DIAGNOSIS — I878 Other specified disorders of veins: Secondary | ICD-10-CM | POA: Insufficient documentation

## 2022-07-26 DIAGNOSIS — G90A Postural orthostatic tachycardia syndrome (POTS): Secondary | ICD-10-CM | POA: Diagnosis present

## 2022-07-26 DIAGNOSIS — I5032 Chronic diastolic (congestive) heart failure: Secondary | ICD-10-CM | POA: Diagnosis present

## 2022-07-26 DIAGNOSIS — I4819 Other persistent atrial fibrillation: Secondary | ICD-10-CM | POA: Diagnosis not present

## 2022-07-26 DIAGNOSIS — I4891 Unspecified atrial fibrillation: Secondary | ICD-10-CM | POA: Diagnosis not present

## 2022-07-26 DIAGNOSIS — I110 Hypertensive heart disease with heart failure: Secondary | ICD-10-CM | POA: Diagnosis present

## 2022-07-26 DIAGNOSIS — D649 Anemia, unspecified: Secondary | ICD-10-CM | POA: Diagnosis not present

## 2022-07-26 DIAGNOSIS — Z743 Need for continuous supervision: Secondary | ICD-10-CM | POA: Diagnosis not present

## 2022-07-26 DIAGNOSIS — E869 Volume depletion, unspecified: Secondary | ICD-10-CM | POA: Diagnosis present

## 2022-07-26 LAB — URINALYSIS, ROUTINE W REFLEX MICROSCOPIC
Bilirubin Urine: NEGATIVE
Glucose, UA: NEGATIVE mg/dL
Hgb urine dipstick: NEGATIVE
Ketones, ur: 5 mg/dL — AB
Leukocytes,Ua: NEGATIVE
Nitrite: NEGATIVE
Protein, ur: NEGATIVE mg/dL
Specific Gravity, Urine: 1.014 (ref 1.005–1.030)
pH: 5 (ref 5.0–8.0)

## 2022-07-26 LAB — CBC
HCT: 32.4 % — ABNORMAL LOW (ref 39.0–52.0)
Hemoglobin: 10.6 g/dL — ABNORMAL LOW (ref 13.0–17.0)
MCH: 26.2 pg (ref 26.0–34.0)
MCHC: 32.7 g/dL (ref 30.0–36.0)
MCV: 80 fL (ref 80.0–100.0)
Platelets: 252 10*3/uL (ref 150–400)
RBC: 4.05 MIL/uL — ABNORMAL LOW (ref 4.22–5.81)
RDW: 16.3 % — ABNORMAL HIGH (ref 11.5–15.5)
WBC: 8.6 10*3/uL (ref 4.0–10.5)
nRBC: 0 % (ref 0.0–0.2)

## 2022-07-26 LAB — GASTROINTESTINAL PANEL BY PCR, STOOL (REPLACES STOOL CULTURE)

## 2022-07-26 LAB — BASIC METABOLIC PANEL
Anion gap: 7 (ref 5–15)
BUN: 15 mg/dL (ref 8–23)
CO2: 30 mmol/L (ref 22–32)
Calcium: 7.2 mg/dL — ABNORMAL LOW (ref 8.9–10.3)
Chloride: 98 mmol/L (ref 98–111)
Creatinine, Ser: 1.09 mg/dL (ref 0.61–1.24)
GFR, Estimated: >60 mL/min (ref >60)
Glucose, Bld: 106 mg/dL — ABNORMAL HIGH (ref 70–99)
Potassium: 2.7 mmol/L — CL (ref 3.5–5.1)
Sodium: 135 mmol/L (ref 135–145)

## 2022-07-26 LAB — MAGNESIUM: Magnesium: 1.7 mg/dL (ref 1.7–2.4)

## 2022-07-26 LAB — CBG MONITORING, ED: Glucose-Capillary: 71 mg/dL (ref 70–99)

## 2022-07-26 LAB — C DIFFICILE QUICK SCREEN W PCR REFLEX
C Diff antigen: NEGATIVE
C Diff interpretation: NOT DETECTED
C Diff toxin: NEGATIVE

## 2022-07-26 MED ORDER — MAGNESIUM SULFATE 2 GM/50ML IV SOLN
2.0000 g | INTRAVENOUS | Status: AC
  Administered 2022-07-26: 2 g via INTRAVENOUS
  Filled 2022-07-26: qty 50

## 2022-07-26 MED ORDER — MIDODRINE HCL 5 MG PO TABS
10.0000 mg | ORAL_TABLET | Freq: Three times a day (TID) | ORAL | Status: DC
  Administered 2022-07-26: 10 mg via ORAL
  Filled 2022-07-26: qty 2

## 2022-07-26 MED ORDER — SODIUM CHLORIDE 0.9 % IV SOLN: INTRAVENOUS | Status: DC

## 2022-07-26 MED ORDER — POTASSIUM CHLORIDE 10 MEQ/100ML IV SOLN
10.0000 meq | INTRAVENOUS | Status: AC
  Administered 2022-07-26 (×4): 10 meq via INTRAVENOUS
  Filled 2022-07-26 (×2): qty 100

## 2022-07-26 MED ORDER — SODIUM CHLORIDE 0.9 % IV BOLUS: 500.0000 mL | Freq: Once | INTRAVENOUS | Status: DC

## 2022-07-26 MED ORDER — ONDANSETRON HCL 4 MG PO TABS: 4.0000 mg | ORAL_TABLET | Freq: Four times a day (QID) | ORAL | Status: DC | PRN

## 2022-07-26 MED ORDER — APIXABAN 5 MG PO TABS
5.0000 mg | ORAL_TABLET | Freq: Two times a day (BID) | ORAL | Status: DC
  Administered 2022-07-26 – 2022-08-01 (×14): 5 mg via ORAL
  Filled 2022-07-26 (×14): qty 1

## 2022-07-26 MED ORDER — AMIODARONE HCL 200 MG PO TABS
200.0000 mg | ORAL_TABLET | Freq: Two times a day (BID) | ORAL | Status: DC
  Administered 2022-07-26 – 2022-07-28 (×5): 200 mg via ORAL
  Filled 2022-07-26 (×5): qty 1

## 2022-07-26 MED ORDER — POTASSIUM CHLORIDE CRYS ER 20 MEQ PO TBCR
40.0000 meq | EXTENDED_RELEASE_TABLET | Freq: Two times a day (BID) | ORAL | Status: DC
  Administered 2022-07-26 – 2022-07-27 (×3): 40 meq via ORAL
  Filled 2022-07-26 (×3): qty 2

## 2022-07-26 MED ORDER — POTASSIUM CHLORIDE CRYS ER 20 MEQ PO TBCR: 40.0000 meq | EXTENDED_RELEASE_TABLET | Freq: Every day | ORAL | Status: DC

## 2022-07-26 MED ORDER — SODIUM CHLORIDE 0.9% FLUSH
3.0000 mL | Freq: Two times a day (BID) | INTRAVENOUS | Status: DC
  Administered 2022-07-26 – 2022-08-01 (×12): 3 mL via INTRAVENOUS

## 2022-07-26 MED ORDER — ACETAMINOPHEN 325 MG PO TABS
650.0000 mg | ORAL_TABLET | Freq: Four times a day (QID) | ORAL | Status: DC | PRN
  Administered 2022-07-27: 650 mg via ORAL
  Filled 2022-07-26: qty 2

## 2022-07-26 MED ORDER — POTASSIUM CHLORIDE CRYS ER 20 MEQ PO TBCR
20.0000 meq | EXTENDED_RELEASE_TABLET | ORAL | Status: DC
  Administered 2022-07-26: 20 meq via ORAL
  Filled 2022-07-26: qty 1

## 2022-07-26 MED ORDER — ONDANSETRON HCL 4 MG/2ML IJ SOLN: 4.0000 mg | Freq: Four times a day (QID) | INTRAMUSCULAR | Status: DC | PRN

## 2022-07-26 MED ORDER — ACETAMINOPHEN 650 MG RE SUPP: 650.0000 mg | Freq: Four times a day (QID) | RECTAL | Status: DC | PRN

## 2022-07-26 MED ORDER — MIDODRINE HCL 5 MG PO TABS
15.0000 mg | ORAL_TABLET | Freq: Three times a day (TID) | ORAL | Status: DC
  Administered 2022-07-26 – 2022-07-28 (×6): 15 mg via ORAL
  Filled 2022-07-26 (×6): qty 3

## 2022-07-26 NOTE — Progress Notes (Signed)
PT Cancellation Note  Patient Details Name: Ernest [name] MRN: [id number] DOB: 1945-10-21   Cancelled Treatment:    Reason Eval/Treat Not Completed: Medical issues which prohibited therapy;Patient not medically ready (Order received, chart reviewed. Medical optimization still in process. K+: 2.7, HR in 140s earlier today, and BP as low as 68/56mHg in standing earlier. Will allow more time for optimization. Reattempt evaluation next day.)  2:58 PM, 07/26/22 AEtta Grandchild PT, DPT Physical Therapist - [facility] [phone]([place]    [facility] 07/26/2022, 2:58 PM

## 2022-07-26 NOTE — ED Notes (Signed)
Pt is hypotensive 82/58. [place] MD notified. Informed this RN scheduled meds ordered to manage symptoms. Will continue plan of care.

## 2022-07-26 NOTE — Assessment & Plan Note (Signed)
Replaced on 2/3.

## 2022-07-26 NOTE — Assessment & Plan Note (Addendum)
Vigorous potassium replacement on 07/26/2022.  Continue oral supplementation.

## 2022-07-26 NOTE — ED Notes (Signed)
MD [place] notified of patient BP 90/62 MAP 70 and requested advice for any new orders.

## 2022-07-26 NOTE — Assessment & Plan Note (Addendum)
Cardioverted 2/5 to normal sinus rhythm.  Cardiology decreased amiodarone dose down to 200 mg daily.  Continue anticoagulation with Eliquis.

## 2022-07-26 NOTE — ED Notes (Signed)
MD [place] notified of orthostatic positive vital signs

## 2022-07-26 NOTE — Assessment & Plan Note (Addendum)
Stool studies negative.  Could be secondary to amiodarone.  Trial of Bentyl to see if this helps.

## 2022-07-26 NOTE — ED Notes (Signed)
Lab Adella Nissen notified this RN of critical lab K+ 2.7. MD [place] notified of results.  MD to order IV and p.o.K+ as well as p.o Mag.  Will continue plan of care.

## 2022-07-26 NOTE — H&P (Signed)
History and Physical    Patient: Ernest [name]:[id number] DOB: Sep 28, 1945 DOA: 07/25/2022 DOS: the patient was seen and examined on 07/26/2022 PCP: [name], MD  Patient coming from: Home  Chief Complaint:  Chief Complaint  Patient presents with   Hypotension    HPI: Ernest [name] is a 77 y.o. male with medical history significant for Class III obesity, chronic venous insufficiency of lower extremities on Lasix 40 mg 3 times a week, GERD, obesity hypoventilation syndrome and chronic respiratory failure on home O2 at 4L, HTN, recent diagnosis of A-fib, on amiodarone and Eliquis with plans for DC cardioversion later this month, chronic hypotension on midodrine started recently, who presents to the ED after having several presyncopal episodes.  Patient has been having intermittent diarrhea for the past month since he was started on the medication for his atrial fibrillation.  He has been using his midodrine as prescribed.  He denies chest pain, shortness of breath or palpitations.  He denies cough, fever or chills.  Denies vomiting or abdominal pain. ED course and data review: BP 87/61, with pulse in the 80s.  BP was briefly fluid responsive to 118/101 but then returned to SBP in the high 80s.  Labs significant for WBC 11,700.  Potassium 3.2.  Lipase and LFTs unremarkable.  Urinalysis unremarkable.EKG, personally reviewed and interpreted showed A-fib at 90 with nonspecific ST-T wave changes. Patient was treated with a 1.5 L NS bolus and started on IV potassium repletion and hospitalist consulted for admission.   Review of Systems: As mentioned in the history of present illness. All other systems reviewed and are negative.  Past Medical History:  Diagnosis Date   Anemia    Arthritis    osteoarthritis - bilateral knees   Chronic venous insufficiency of lower extremity    Dyspnea    GERD (gastroesophageal reflux disease)    Sleep apnea    Small bowel obstruction ([place]) 2022    Uses roller walker    Past Surgical History:  Procedure Laterality Date   CATARACT EXTRACTION W/PHACO Left 08/06/2020   Procedure: CATARACT EXTRACTION PHACO AND INTRAOCULAR LENS PLACEMENT ([place]) LEFT;  Surgeon: Eulogio Bear, MD;  Location: [place];  Service: Ophthalmology;  Laterality: Left;  3.41 0:31.5   CATARACT EXTRACTION W/PHACO Right 08/27/2020   Procedure: CATARACT EXTRACTION PHACO AND INTRAOCULAR LENS PLACEMENT ([facility]) RIGHT 4.10 00:41.6;  Surgeon: Eulogio Bear, MD;  Location: [place];  Service: Ophthalmology;  Laterality: Right;   COLONOSCOPY WITH ESOPHAGOGASTRODUODENOSCOPY (EGD)     COLONOSCOPY WITH PROPOFOL N/A 11/01/2020   Procedure: COLONOSCOPY WITH PROPOFOL;  Surgeon: Lin Landsman, MD;  Location[facility] ENDOSCOPY;  Service: Gastroenterology;  Laterality: N/A;   COLONOSCOPY WITH PROPOFOL N/A 03/18/2022   Procedure: COLONOSCOPY WITH BIOPSIES;  Surgeon: Lin Landsman, MD;  Location: [place];  Service: Endoscopy;  Laterality: N/A;   ESOPHAGOGASTRODUODENOSCOPY (EGD) WITH PROPOFOL N/A 11/01/2020   Procedure: ESOPHAGOGASTRODUODENOSCOPY (EGD) WITH PROPOFOL;  Surgeon: Lin Landsman, MD;  Location: [facility] ENDOSCOPY;  Service: Gastroenterology;  Laterality: N/A;   HERNIA REPAIR     inguinal and umbilical   POLYPECTOMY  03/18/2022   Procedure: POLYPECTOMY;  Surgeon: Lin Landsman, MD;  Location: [place];  Service: Endoscopy;;   Social History:  reports that he has never smoked. He has never used smokeless tobacco. He reports current alcohol use. He reports that he does not currently use drugs.  No Known Allergies  No family history on file.  Prior to Admission medications   Medication Sig Start Date End Date Taking? Authorizing Provider  acetaminophen (TYLENOL) 325 MG tablet Take 325 mg by mouth every 8 (eight) hours as needed. 09/01/19   [provider]  amiodarone (PACERONE) 200 MG tablet TAKE 1 TABLET BY  MOUTH TWICE A DAY FOR 30 DAYS. THEN TAKE 1 TABLET EVERY DAY 07/18/22   Fenton, Clint R, PA  apixaban (ELIQUIS) 5 MG TABS tablet Take 1 tablet (5 mg total) by mouth 2 (two) times daily. 06/25/22   Croitoru, Mihai, MD  furosemide (LASIX) 40 MG tablet Take one tablet ('40mg'$ ) three times a week. 07/14/22   Croitoru, Mihai, MD  midodrine (PROAMATINE) 10 MG tablet Take 1 tablet (10 mg total) by mouth 3 (three) times daily. 07/18/22   Kate Sable, MD  nystatin (MYCOSTATIN/NYSTOP) powder Apply 1 Application topically 3 (three) times daily. 02/25/22   [name], MD  pantoprazole (PROTONIX) 40 MG tablet Take 1 tablet (40 mg total) by mouth daily. 01/20/22   [name], MD  potassium chloride SA (KLOR-CON M) 20 MEQ tablet Take 1 tablet (20 mEq total) by mouth 3 (three) times a week. 07/18/22   Kate Sable, MD  Probiotic Product (PROBIOTIC DAILY PO) Take by mouth.    [provider]  traMADol (ULTRAM) 50 MG tablet Take 50 mg by mouth every 6 (six) hours as needed for moderate pain.    [provider]    Physical Exam: Vitals:   07/25/22 2330 07/26/22 0000 07/26/22 0030 07/26/22 0100  BP: (!) 118/101 (!) 82/55 (!) 85/64 (!) 87/60  Pulse: 85 87 85 82  Resp: (!) '23 18 20   '$ Temp:   98 F (36.7 C)   TempSrc:   Oral   SpO2: 99% 100% 99% 100%  Weight:       Physical Exam Vitals and nursing note reviewed.  Constitutional:      General: He is not in acute distress. HENT:     Head: Normocephalic and atraumatic.  Cardiovascular:     Rate and Rhythm: Normal rate. Rhythm irregular.     Heart sounds: Normal heart sounds.  Pulmonary:     Effort: Pulmonary effort is normal.     Breath sounds: Normal breath sounds.  Abdominal:     Palpations: Abdomen is soft.     Tenderness: There is no abdominal tenderness.  Musculoskeletal:     Right lower leg: Edema present.     Left lower leg: Edema present.  Neurological:     Mental Status: Mental status is at baseline.     Labs on  Admission: I have personally reviewed following labs and imaging studies  CBC: Recent Labs  Lab 07/25/22 2101  WBC 11.7*  HGB 13.1  HCT 40.8  MCV 80.8  PLT 397   Basic Metabolic Panel: Recent Labs  Lab 07/25/22 2101  NA 136  K 3.2*  CL 93*  CO2 31  GLUCOSE 104*  BUN 17  CREATININE 1.08  CALCIUM 7.8*   GFR: Estimated Creatinine Clearance: 85 mL/min (by C-G formula based on SCr of 1.08 mg/dL). Liver Function Tests: Recent Labs  Lab 07/25/22 2101  AST 19  ALT 9  ALKPHOS 71  BILITOT 1.3*  PROT 5.8*  ALBUMIN 2.4*   Recent Labs  Lab 07/25/22 2101  LIPASE 30   No results for input(s): "AMMONIA" in the last 168 hours. Coagulation Profile: No results for input(s): "INR", "PROTIME" in the last 168 hours. Cardiac Enzymes: No results for input(s): "  CKTOTAL", "CKMB", "CKMBINDEX", "TROPONINI" in the last 168 hours. BNP (last 3 results) No results for input(s): "PROBNP" in the last 8760 hours. HbA1C: No results for input(s): "HGBA1C" in the last 72 hours. CBG: No results for input(s): "GLUCAP" in the last 168 hours. Lipid Profile: No results for input(s): "CHOL", "HDL", "LDLCALC", "TRIG", "CHOLHDL", "LDLDIRECT" in the last 72 hours. Thyroid Function Tests: No results for input(s): "TSH", "T4TOTAL", "FREET4", "T3FREE", "THYROIDAB" in the last 72 hours. Anemia Panel: No results for input(s): "VITAMINB12", "FOLATE", "FERRITIN", "TIBC", "IRON", "RETICCTPCT" in the last 72 hours. Urine analysis:    Component Value Date/Time   COLORURINE YELLOW (A) 07/25/2022 2354   APPEARANCEUR HAZY (A) 07/25/2022 2354   LABSPEC 1.014 07/25/2022 2354   PHURINE 5.0 07/25/2022 2354   GLUCOSEU NEGATIVE 07/25/2022 2354   HGBUR NEGATIVE 07/25/2022 2354   BILIRUBINUR NEGATIVE 07/25/2022 2354   KETONESUR 5 (A) 07/25/2022 2354   PROTEINUR NEGATIVE 07/25/2022 2354   NITRITE NEGATIVE 07/25/2022 2354   LEUKOCYTESUR NEGATIVE 07/25/2022 2354    Radiological Exams on Admission: No results  found.   Data Reviewed: Relevant notes from primary care and specialist visits, past discharge summaries as available in EHR, including Care Everywhere. Prior diagnostic testing as pertinent to current admission diagnoses Updated medications and problem lists for reconciliation ED course, including vitals, labs, imaging, treatment and response to treatment Triage notes, nursing and pharmacy notes and ED provider's notes Notable results as noted in HPI   Assessment and Plan: * Recurrent syncope Orthostatic hypotension/acute on chronic hypotension Patient with chronic hypotension on midodrine presenting with diarrhea and presyncope Suspect secondary to volume depletion and orthostatic hypotension, in combination with Lasix which patient probably takes for his venous insufficiency (EF 60 to 65% in January 2024) IV hydration Fall precautions Continue midodrine  Diarrhea IV hydration GI panel patient able to produce stool  Hypokalemia Potassium was 3.2 and was repleted in the ED Continue to monitor  Chronic venous stasis Patient is currently on Lasix 40 mg 3 times a week which we will discontinue for now Had a recent echo in January 2024 that showed EF 60 to 65% Keep legs elevated  Persistent atrial fibrillation (HCC) Chronic anticoagulation Recently diagnosed in January 2024 Continue Eliquis and amiodarone (BP unable to tolerate metoprolol and patient could not tolerate digoxin per cardiology note) Per cardiology plans for DC cardioversion later this month  Chronic respiratory failure with hypoxia ([place]) Obesity hypoventilation syndrome Patient is intolerant of BiPAP but uses home O2 Continue home oxygen  Obesity, Class III, BMI 40-49.9 (morbid obesity) ([place]) Complicating factor to overall prognosis and care        DVT prophylaxis: Eliquis  Consults: [place] cardiology, Dr Quentin Ore  Advance Care Planning: DNR  Family Communication: none  Disposition Plan: Back to  previous home environment  Severity of Illness: The appropriate patient status for this patient is OBSERVATION. Observation status is judged to be reasonable and necessary in order to provide the required intensity of service to ensure the patient's safety. The patient's presenting symptoms, physical exam findings, and initial radiographic and laboratory data in the context of their medical condition is felt to place them at decreased risk for further clinical deterioration. Furthermore, it is anticipated that the patient will be medically stable for discharge from the hospital within 2 midnights of admission.   Author: Athena Masse, MD 07/26/2022 1:26 AM  For on call review [email or web address].

## 2022-07-26 NOTE — IPAL (Signed)
  Interdisciplinary Goals of Care Family Meeting   Date carried out: 07/26/2022  Location of the meeting: Bedside  Member's involved: Physician  Durable Power of Attorney or acting medical decision maker: patient    Discussion: We discussed goals of care for [place]  I have reviewed medical records including EPIC notes, labs and imaging, assessed the patient and then met with patient to discuss major active diagnoses, plan of care, natural trajectory, prognosis, GOC, EOL wishes, disposition and options including Full code/DNI/DNR and the concept of comfort care if DNR is elected. Questions and concerns were addressed. They are  in agreement to continue current plan of care . Election for DNR status.   Code status:   Code Status: DNR   Disposition: Continue current acute care  Time spent for the meeting: [place], MD  07/26/2022, 2:40 AM

## 2022-07-26 NOTE — Assessment & Plan Note (Deleted)
With orthostatic hypotension.  Increase midodrine to 15 mg 3 times daily

## 2022-07-26 NOTE — Assessment & Plan Note (Addendum)
Secondary to orthostatic hypotension

## 2022-07-26 NOTE — Consult Note (Signed)
Cardiology Consult    Patient ID: Ernest [name] MRN: [id number], DOB/AGE: 01/02/1946   Admit date: 07/25/2022 Date of Consult: 07/26/2022  Primary Physician: Ernest Athens, MD Primary Cardiologist: Ernest Sable, MD Requesting Provider: R. Ernest Peer, MD  Patient Profile    Ernest [name] is a 77 y.o. male with a history of chronic resp failure on home O2, morbid obesity, OSA (BiPAP intolerant), OHS, recently dx persistent Afib, SVT vs junctional tachycardia, venous insufficiency, GERD, partial SBO, and HTN, who is being seen today for the evaluation of hypotension and persistent atrial fibrillation at the request of Dr. Leslye [name].  Past Medical History   Past Medical History:  Diagnosis Date   Anemia    Arthritis    osteoarthritis - bilateral knees   Chronic venous insufficiency of lower extremity    Diastolic dysfunction    a. 12/2020 Echo: EF 50-55%, GrI DD; b. 06/2022 Echo: EF 60-65%, no rwma, nl RV fxn, nl LA size, mildly dil RA.   Dyspnea    Essential hypertension    GERD (gastroesophageal reflux disease)    Hypotension    a. 06/2022 midodrine added.   Obesity hypoventilation syndrome (HCC)    Persistent atrial fibrillation ([place])    a. Dx 06/2022-->amio/eliquis. CHA2DS2VASc = 3.   Sleep apnea    Small bowel obstruction ([place]) 2022   Uses roller walker     Past Surgical History:  Procedure Laterality Date   CATARACT EXTRACTION W/PHACO Left 08/06/2020   Procedure: CATARACT EXTRACTION PHACO AND INTRAOCULAR LENS PLACEMENT (IOC) LEFT;  Surgeon: Ernest Bear, MD;  Location: [place];  Service: Ophthalmology;  Laterality: Left;  3.41 0:31.5   CATARACT EXTRACTION W/PHACO Right 08/27/2020   Procedure: CATARACT EXTRACTION PHACO AND INTRAOCULAR LENS PLACEMENT (IOC) RIGHT 4.10 00:41.6;  Surgeon: Ernest Bear, MD;  Location: [place];  Service: Ophthalmology;  Laterality: Right;   COLONOSCOPY WITH ESOPHAGOGASTRODUODENOSCOPY (EGD)     COLONOSCOPY  WITH PROPOFOL N/A 11/01/2020   Procedure: COLONOSCOPY WITH PROPOFOL;  Surgeon: Ernest Landsman, MD;  Location: [name] ENDOSCOPY;  Service: Gastroenterology;  Laterality: N/A;   COLONOSCOPY WITH PROPOFOL N/A 03/18/2022   Procedure: COLONOSCOPY WITH BIOPSIES;  Surgeon: Ernest Landsman, MD;  Location: [place];  Service: Endoscopy;  Laterality: N/A;   ESOPHAGOGASTRODUODENOSCOPY (EGD) WITH PROPOFOL N/A 11/01/2020   Procedure: ESOPHAGOGASTRODUODENOSCOPY (EGD) WITH PROPOFOL;  Surgeon: Ernest Landsman, MD;  Location: [facility] ENDOSCOPY;  Service: Gastroenterology;  Laterality: N/A;   HERNIA REPAIR     inguinal and umbilical   POLYPECTOMY  03/18/2022   Procedure: POLYPECTOMY;  Surgeon: Ernest Landsman, MD;  Location: [place];  Service: Endoscopy;;     Allergies  No Known Allergies  History of Present Illness    77 y/o ? with the above complex PMH including chronic resp failure on home O2, morbid obesity, OSA (BiPAP intolerant), OHS, recently dx persistent Afib, SVT vs junctional tachycardia, venous insufficiency, GERD, partial SBO, and HTN.  Echo in 12/2020, showed EF of 50-55% w/ GrI DD, and nl RV fxn.  [name] was recently evaluated by Dr. Sallyanne [name] related to concern for SVT noted on a Jan 2022 ECG.  This was felt to most likely represent a junctional tachycardia, though at 1/3 office visit, pt was noted to be in asymptomatic Afib @ 138 bpm, and he was also hypotensive @ 84/54.  His amiloride-HCTZ was d/c'd and he was placed on digoxin and eliquis.  Echo 1/16 showed EF of 60-65%  w/o rwma, nl RV fxn, and mildly dil RA.  Lasix '40mg'$  three x/wk was started on 1/20.  He was seen in afib clinic on 1/22 and rates remained elevated.  Digoxin was d/c'd and he was placed on amio '200mg'$  bid.  Midodrine '10mg'$  BID was added in the setting of ongoing hypotension.  He then established care w/ Dr. Garen [name] on 1/26.  BP remained soft in the 80's and midodrine was increased to '10mg'$  TID  w/plan for DCCV this coming Wednesday, February 7.  Since being placed on midodrine and amiodarone, patient has noted intermittent diarrhea.  He has a history of small bowel obstruction followed at [facility], and with that, has some degree of chronic bloating if he eats too much.  This is seem to be worse over the past week and over the past 3 days, he has also been experiencing early satiety and anorexia.  On February 2, he had 4 bouts of watery diarrhea with associated orthostasis and presyncope.  It was because of lightheadedness, that he presented to the emergency department.  Here, his blood pressure was 87/61.  ECG - afib 90, incomplete RBBB, ? Inf infarct.  HsTrop nl.  K 3.2, and this has been supplemented.  He also received a 500 mL saline bolus.  Pressure currently in the 90s.  He is asymptomatic at rest at this time and has had no further diarrhea.  Was able to tolerate breakfast well.  He was hoping that he would be able to be cardioverted while hospitalized as transportation is difficult for he and his wife, she cannot help him in and out of a car, and he was hoping to not have to come back on Wednesday.  Inpatient Medications     amiodarone  200 mg Oral BID   apixaban  5 mg Oral BID   midodrine  15 mg Oral TID with meals   [START ON 07/27/2022] potassium chloride SA  40 mEq Oral Daily   sodium chloride flush  3 mL Intravenous Q12H    Family History    No premature CAD.  Parents are deceased.  Social History    Social History   Socioeconomic History   Marital status: Married    Spouse name: Not on file   Number of children: Not on file   Years of education: Not on file   Highest education level: Not on file  Occupational History   Not on file  Tobacco Use   Smoking status: Never   Smokeless tobacco: Never   Tobacco comments:    Never smoke 07/14/22  Substance and Sexual Activity   Alcohol use: Yes    Alcohol/week: 0.0 standard drinks of alcohol    Comment: rarely   Drug  use: Not Currently   Sexual activity: Not on file  Other Topics Concern   Not on file  Social History Narrative   Lives locally with wife.  Does not routinely exercise.   Social Determinants of Health   Financial Resource Strain: Low Risk  (06/21/2021)   Overall Financial Resource Strain (CARDIA)    Difficulty of Paying Living Expenses: Not hard at all  Food Insecurity: No Food Insecurity (06/21/2021)   Hunger Vital Sign    Worried About Running Out of Food in the Last Year: Never true    Ran Out of Food in the Last Year: Never true  Transportation Needs: No Transportation Needs (06/21/2021)   PRAPARE - Hydrologist (Medical): No  Lack of Transportation (Non-Medical): No  Physical Activity: Inactive (06/21/2021)   Exercise Vital Sign    Days of Exercise per Week: 0 days    Minutes of Exercise per Session: 0 min  Stress: No Stress Concern Present (06/21/2021)   Vander    Feeling of Stress : Not at all  Social Connections: Unknown (06/21/2021)   Social Connection and Isolation Panel [NHANES]    Frequency of Communication with Friends and Family: Twice a week    Frequency of Social Gatherings with Friends and Family: Never    Attends Religious Services: Patient refused    Marine scientist or Organizations: Patient refused    Attends Archivist Meetings: Patient refused    Marital Status: Married  Human resources officer Violence: Not At Risk (06/21/2021)   Humiliation, Afraid, Rape, and Kick questionnaire    Fear of Current or Ex-Partner: No    Emotionally Abused: No    Physically Abused: No    Sexually Abused: No     Review of Systems    General:  No chills, fever, night sweats or weight changes.  Cardiovascular:  No chest pain, dyspnea on exertion, +++ chronic lower extremity edema which has been well-managed with Lasix 3 times per week.  No orthopnea,  palpitations, paroxysmal nocturnal dyspnea. Dermatological: No rash, lesions/masses Respiratory: No cough, dyspnea Urologic: No hematuria, dysuria Abdominal:   +++ Early satiety and anorexia x 3 days. +++  Watery diarrhea increasing in frequency over the past day.  No nausea, vomiting, bright red blood per rectum, melena, or hematemesis Neurologic:  No visual changes, wkns, changes in mental status. All other systems reviewed and are otherwise negative except as noted above.  Physical Exam    Blood pressure 90/62, pulse 85, temperature 98.4 F (36.9 C), temperature source Oral, resp. rate 18, weight (!) 138.3 kg, SpO2 98 %.  General: Pleasant, NAD Psych: Normal affect. Neuro: Alert and oriented X 3. Moves all extremities spontaneously. HEENT: Normal  Neck: Supple, obese, difficult to gauge JVP.  No bruits or masses. Lungs:  Resp regular and unlabored, CTA. Heart: Irregularly irregular, no s3, s4, or murmurs. Abdomen: Obese, soft, non-tender, non-distended, BS + x 4.  Extremities: No clubbing, cyanosis or edema. DP/PT2+, Radials 2+ and equal bilaterally.  Labs    Cardiac Enzymes Recent Labs  Lab 07/25/22 2101  TROPONINIHS 4     BNP    Component Value Date/Time   BNP 158.7 (H) 07/04/2022 1537   BNP 76.7 10/05/2020 1015    Lab Results  Component Value Date   WBC 8.6 07/26/2022   HGB 10.6 (L) 07/26/2022   HCT 32.4 (L) 07/26/2022   MCV 80.0 07/26/2022   PLT 252 07/26/2022    Recent Labs  Lab 07/25/22 2101  NA 136  K 3.2*  CL 93*  CO2 31  BUN 17  CREATININE 1.08  CALCIUM 7.8*  PROT 5.8*  BILITOT 1.3*  ALKPHOS 71  ALT 9  AST 19  GLUCOSE 104*      Radiology Studies    DG Chest Port 1 View  Result Date: 07/26/2022 CLINICAL DATA:  [id number] Syncope [id number] EXAM: PORTABLE CHEST 1 VIEW COMPARISON:  Chest x-ray 10/05/2020, CT chest 10/05/2020 FINDINGS: Stable enlarged cardiac silhouette. The heart and mediastinal contours are unchanged. Aortic calcification. No focal  consolidation. No pulmonary edema. Nonspecific blunting of left costophrenic angle. Trace left pleural effusion not excluded. No right pleural effusion effusion. No pneumothorax. No  acute osseous abnormality. IMPRESSION: 1. No active disease. 2. Trace left pleural effusion not excluded. 3.  Aortic Atherosclerosis (ICD10-I70.0). Electronically Signed   By: Iven Finn M.D.   On: 07/26/2022 02:27   ECG & Cardiac Imaging    Afib, 90, incomplete RBBB, inf infarct - personally reviewed.  Assessment & Plan    1.  Hypotension: Patient seen earlier this month in the setting of new diagnosis of atrial fibrillation and was noted to be hypotensive.  He was subsequently placed on midodrine in the setting of ongoing hypotension, on January 22.  On the same day, he was placed on amiodarone for persistent atrial fibrillation.  Since then, he has noted intermittent diarrhea.  Midodrine was titrated to 10 mg 3 times daily at cardiology office visit January 26.  He had 4 bouts of watery diarrhea on February 2 with associated presyncope, prompting ED evaluation.  Here, blood pressure was 87/61.  He was in relatively rate controlled A-fib at 90 bpm.  Troponin normal.  BUN and creatinine normal though slightly higher than January 26 values, at 17 and 1.08 respectively.  Potassium 3.2.  Suspect baseline hypotension exacerbated by diarrhea and mild dehydration.  Also concerned that amiodarone may be contributing to GI symptoms given temporal relationship to initiation and symptom onset.  IV fluids per primary team.  Continue midodrine.  Pressures currently stable and patient asymptomatic at rest.  2.  Persistent atrial fibrillation: Unknown duration overall however, he was diagnosed with atrial fibrillation with rapid ventricular response on June 25, 2022.  He was asymptomatic at the time of diagnosis.  He was initially placed on digoxin and subsequently placed on amiodarone 200 mg twice daily on January 22.  since then,  he has had intermittent diarrhea with anorexia x 3 days and worsening diarrhea on February 1.  He remains in atrial fibrillation at 90 bpm today.  He is asymptomatic at rest.  Recent echo with normal LV and RV function and mildly dilated right atrium.  He is scheduled for cardioversion on Wednesday, February 7.  He is scheduled to see Dr. Quentin Ore later this month.  Patient notes that transportation is a concern for him as his wife drives him but has difficulty getting him into and out of the car.  He is hopeful that if he remains in the hospital this weekend, that we could move up his cardioversion to Monday, February 5.  For now, continue amiodarone and Eliquis, though we may need to consider discontinuation of amiodarone if GI symptoms persist or worsen throughout the weekend, or in the outpatient setting.  Will plan to reach out to anesthesia first thing Monday morning regarding cardioversion.  Supplement potassium.  3.  OSA/OHS: Intolerant to BiPAP.  Likely contributing to development of atrial fibrillation and will make sustaining sinus rhythm post cardioversion challenging.  Not a candidate for inspire device secondary to weight.  4.  Hypokalemia: Potassium 3.2 on arrival.  He has received intravenous supplementation.  Labs pending this morning.  5.  Diarrhea: Intermittent diarrhea over the past 10 to 14 days with 4 episodes of watery diarrhea on February 2.  Symptoms temporally related to initiation of amiodarone/midodrine.  C. difficile pending.  See #2 regarding amiodarone.  6.  Chronic venous insufficiency: Was using Lasix 40 mg 3 times a week at home without significant edema.  No significant edema on examination today.  Risk Assessment/Risk Scores:          CHA2DS2-VASc Score = 4   This indicates  a 4.8% annual risk of stroke. The patient's score is based upon: CHF History: 1 HTN History: 1 Diabetes History: 0 Stroke History: 0 Vascular Disease History: 0 Age Score: 2 Gender Score:  0     Signed, Murray Hodgkins, NP 07/26/2022, 10:25 AM  For questions or updates, please contact   Please consult [email or web address] for contact info under Cardiology/STEMI.

## 2022-07-26 NOTE — Hospital Course (Signed)
77 y.o. male with medical history significant for Class III obesity, chronic venous insufficiency of lower extremities on Lasix 40 mg 3 times a week, GERD, obesity hypoventilation syndrome and chronic respiratory failure on home O2 at 4L, HTN, recent diagnosis of A-fib, on amiodarone and Eliquis with plans for DC cardioversion later this month, chronic hypotension on midodrine started recently, who presents to the ED after having several presyncopal episodes.  Patient has been having intermittent diarrhea for the past month since he was started on the medication for his atrial fibrillation.  He has been using his midodrine as prescribed.  He denies chest pain, shortness of breath or palpitations.  He denies cough, fever or chills.  Denies vomiting or abdominal pain. ED course and data review: BP 87/61, with pulse in the 80s.  BP was briefly fluid responsive to 118/101 but then returned to SBP in the high 80s.  Labs significant for WBC 11,700.  Potassium 3.2.  Lipase and LFTs unremarkable.  Urinalysis unremarkable.EKG, personally reviewed and interpreted showed A-fib at 90 with nonspecific ST-T wave changes. Patient was treated with a 1.5 L NS bolus and started on IV potassium repletion and hospitalist consulted for admission.  2/3.  Repeat electrolytes show potassium of 2.7 magnesium 1.7.  Replace potassium IV and magnesium IV.  Patient still hypotensive will increase midodrine to 15 mg 3 times daily.

## 2022-07-26 NOTE — Assessment & Plan Note (Addendum)
BMI 41.04

## 2022-07-26 NOTE — Assessment & Plan Note (Addendum)
Hold Lasix for now

## 2022-07-26 NOTE — ED Notes (Signed)
Pt transitioned to a hospital bed with assistance from [place], [place] to promote comfort.

## 2022-07-26 NOTE — Assessment & Plan Note (Addendum)
Obesity hypoventilation syndrome Patient is intolerant of BiPAP but uses home O2 Continue home oxygen 4 L

## 2022-07-26 NOTE — Progress Notes (Signed)
Progress Note   Patient: Ernest [name] WUJ:[id number] DOB: 1945/07/03 DOA: 07/25/2022     0 DOS: the patient was seen and examined on 07/26/2022   Brief hospital course: 77 y.o. male with medical history significant for Class III obesity, chronic venous insufficiency of lower extremities on Lasix 40 mg 3 times a week, GERD, obesity hypoventilation syndrome and chronic respiratory failure on home O2 at 4L, HTN, recent diagnosis of A-fib, on amiodarone and Eliquis with plans for DC cardioversion later this month, chronic hypotension on midodrine started recently, who presents to the ED after having several presyncopal episodes.  Patient has been having intermittent diarrhea for the past month since he was started on the medication for his atrial fibrillation.  He has been using his midodrine as prescribed.  He denies chest pain, shortness of breath or palpitations.  He denies cough, fever or chills.  Denies vomiting or abdominal pain. ED course and data review: BP 87/61, with pulse in the 80s.  BP was briefly fluid responsive to 118/101 but then returned to SBP in the high 80s.  Labs significant for WBC 11,700.  Potassium 3.2.  Lipase and LFTs unremarkable.  Urinalysis unremarkable.EKG, personally reviewed and interpreted showed A-fib at 90 with nonspecific ST-T wave changes. Patient was treated with a 1.5 L NS bolus and started on IV potassium repletion and hospitalist consulted for admission.  2/3.  Repeat electrolytes show potassium of 2.7 magnesium 1.7.  Replace potassium IV and magnesium IV.  Patient still hypotensive will increase midodrine to 15 mg 3 times daily.  Assessment and Plan: * Hypokalemia Potassium 2.7 on repeat labs.  IV and oral replacement along with replacing magnesium.  Hypomagnesemia 2 g of IV magnesium  Chronic hypotension With orthostatic hypotension.  Increase midodrine to 15 mg 3 times daily  Recurrent syncope Orthostatic hypotension/acute on chronic  hypotension Increase midodrine to 15 mg 3 times daily.  Diarrhea Stool studies if further diarrhea.  Wondering if this is secondary to amiodarone.  Chronic venous stasis Hold Lasix for now  Persistent atrial fibrillation [facility]) Chronic anticoagulation Cardiology will set up cardioversion on Monday possibly.  Chronic respiratory failure with hypoxia (HCC) Obesity hypoventilation syndrome Patient is intolerant of BiPAP but uses home O2 Continue home oxygen 4 L  Obesity, Class III, BMI 40-49.9 (morbid obesity) (HCC) BMI 40.24        Subjective: Over the last couple weeks patient has gotten lightheaded with sitting down to go to the bathroom.  Friday almost passed out.  He has been having a lot of diarrhea.  Physical Exam: Vitals:   07/26/22 0830 07/26/22 0900 07/26/22 0952 07/26/22 1100  BP: 98/75 (!) '97/57 90/62 91/63 '$  Pulse: 86 97 85 84  Resp:  '19 18 20  '$ Temp:      TempSrc:      SpO2: 100% 98% 98%   Weight:       Physical Exam HENT:     Head: Normocephalic.     Mouth/Throat:     Pharynx: No oropharyngeal exudate.  Eyes:     General: Lids are normal.     Conjunctiva/sclera: Conjunctivae normal.  Cardiovascular:     Rate and Rhythm: Normal rate. Rhythm irregularly irregular.     Heart sounds: Normal heart sounds, S1 normal and S2 normal.  Pulmonary:     Breath sounds: No decreased breath sounds, wheezing, rhonchi or rales.  Abdominal:     Palpations: Abdomen is soft.     Tenderness: There is no abdominal  tenderness.  Musculoskeletal:     Right lower leg: Swelling present.     Left lower leg: Swelling present.  Skin:    General: Skin is warm.     Findings: No rash.  Neurological:     Mental Status: He is alert and oriented to person, place, and time.     Data Reviewed: Potassium 2.7, creatinine 1.09, magnesium 1.7, hemoglobin 10.6  Family Communication: Updated patient's wife on the phone  Disposition: Status is: Observation Replacing potassium IV  and orally, replacing IV magnesium.  Cardiology to see if they can set up cardioversion while here.  Planned Discharge Destination: Home with Home Health    Time spent: 28 minutes  Author: Loletha Grayer, MD 07/26/2022 11:37 AM  For on call review [email or web address].

## 2022-07-27 DIAGNOSIS — R197 Diarrhea, unspecified: Secondary | ICD-10-CM | POA: Diagnosis not present

## 2022-07-27 DIAGNOSIS — I4819 Other persistent atrial fibrillation: Secondary | ICD-10-CM | POA: Diagnosis not present

## 2022-07-27 DIAGNOSIS — I9589 Other hypotension: Secondary | ICD-10-CM | POA: Diagnosis not present

## 2022-07-27 LAB — BASIC METABOLIC PANEL
Anion gap: 6 (ref 5–15)
BUN: 12 mg/dL (ref 8–23)
CO2: 31 mmol/L (ref 22–32)
Calcium: 7.4 mg/dL — ABNORMAL LOW (ref 8.9–10.3)
Chloride: 102 mmol/L (ref 98–111)
Creatinine, Ser: 0.97 mg/dL (ref 0.61–1.24)
GFR, Estimated: >60 mL/min (ref >60)
Glucose, Bld: 90 mg/dL (ref 70–99)
Potassium: 3.5 mmol/L (ref 3.5–5.1)
Sodium: 139 mmol/L (ref 135–145)

## 2022-07-27 LAB — GLUCOSE, CAPILLARY: Glucose-Capillary: 80 mg/dL (ref 70–99)

## 2022-07-27 LAB — CBC
HCT: 33.6 % — ABNORMAL LOW (ref 39.0–52.0)
Hemoglobin: 10.7 g/dL — ABNORMAL LOW (ref 13.0–17.0)
MCH: 26.3 pg (ref 26.0–34.0)
MCHC: 31.8 g/dL (ref 30.0–36.0)
MCV: 82.6 fL (ref 80.0–100.0)
Platelets: 242 10*3/uL (ref 150–400)
RBC: 4.07 MIL/uL — ABNORMAL LOW (ref 4.22–5.81)
RDW: 17.2 % — ABNORMAL HIGH (ref 11.5–15.5)
WBC: 7.6 10*3/uL (ref 4.0–10.5)
nRBC: 0 % (ref 0.0–0.2)

## 2022-07-27 LAB — PHOSPHORUS: Phosphorus: 3.6 mg/dL (ref 2.5–4.6)

## 2022-07-27 LAB — MAGNESIUM: Magnesium: 2.2 mg/dL (ref 1.7–2.4)

## 2022-07-27 MED ORDER — FLUDROCORTISONE ACETATE 0.1 MG PO TABS
0.1000 mg | ORAL_TABLET | Freq: Every day | ORAL | Status: DC
  Filled 2022-07-27: qty 1

## 2022-07-27 MED ORDER — DICYCLOMINE HCL 10 MG PO CAPS
10.0000 mg | ORAL_CAPSULE | Freq: Three times a day (TID) | ORAL | Status: DC
  Administered 2022-07-27 – 2022-07-29 (×6): 10 mg via ORAL
  Filled 2022-07-27 (×10): qty 1

## 2022-07-27 MED ORDER — POTASSIUM CHLORIDE CRYS ER 20 MEQ PO TBCR
20.0000 meq | EXTENDED_RELEASE_TABLET | Freq: Two times a day (BID) | ORAL | Status: DC
  Administered 2022-07-27 – 2022-08-01 (×10): 20 meq via ORAL
  Filled 2022-07-27 (×10): qty 1

## 2022-07-27 NOTE — Evaluation (Addendum)
Physical Therapy Evaluation Patient Details Name: Ernest [name] MRN: [id number] DOB: [birth year] Today's Date: 07/27/2022  History of Present Illness  76yoM Ernest [name], Ernest [name], comes to [facility] 2/2 after several 4-5 near syncopal episodes preceding day and 5 days diarrhea. Pt started on midodrine 2 weeks prior due to similar problem, pt notes success with meds. Pt followed by cardiology, consulting this admission, questions regarding symptoms attributed to amiodarone, pt previously scheduled for cardioversion 07/30/22.  Clinical Impression  Pt in bed on entry, awake, alaert, interactive, very much motivated to partake in evaluation. Pt remains soft on pressures, but his report indicates this may not be much different from lifelong lower BP. He has appropriate response in pressure from supine to sitting. He tolerates standing for less than 3 minutes, but per pt and wife, this is much longer than baseline tolerance. Sounds like orthostatic intolerance has been an ongoing limitations, however safely managed at home with medications and activity restriction, at least up until recent diarrhea and associated dehydration. His HR response to BP drops is dramatic and although I suspect somewhat affective given his tolerance today, not ideal for a recurrent mechanism given the degree of elevation into 160s bpm. From what pt and I discussed, sounds like his orthostatic is being treated/managed as an outpatient, although worse on arrival, I do no t expect it to full resolve prior to DC, however pt and wife have done a good job managing safe mobility in such circumstances. Also discussed at length management and use of supplemental O2 at home and importance of reliance of oximetry to meet goals, rather than increasing flow rate ad lib to address DOE in the absence of hypoxia. This needs to be discussed with whichever provider manages his O2 use. Will continue to follow.     07/27/22 1040  Therapy Vitals  Patient Position  (if appropriate) Orthostatic Vitals  Orthostatic Lying   BP- Lying (!) 86/64  Pulse- Lying  (telel not calculating)  Orthostatic Sitting  BP- Sitting 98/67  Pulse- Sitting 86 (calculation using tele wave)  Orthostatic Standing at 0 minutes  BP- Standing at ? minutes (!) 79/63 (taken s/p return to sitting, post 90sec standing and marching in place. midodrine at 0922. (no symptoms))  Pulse- Standing at ? minutes 166  Oxygen Therapy  SpO2 97 %  O2 Device Room Air        Recommendations for follow up therapy are one component of a multi-disciplinary discharge planning process, led by the attending physician.  Recommendations may be updated based on patient status, additional functional criteria and insurance authorization.  Follow Up Recommendations Home health PT      Assistance Recommended at Discharge Set up Supervision/Assistance  Patient can return home with the following  A lot of help with bathing/dressing/bathroom;A little help with bathing/dressing/bathroom;Assist for transportation;Help with stairs or ramp for entrance;Assistance with cooking/housework    Equipment Recommendations None recommended by PT  Recommendations for Other Services       Functional Status Assessment Patient has had a recent decline in their functional status and demonstrates the ability to make significant improvements in function in a reasonable and predictable amount of time.     Precautions / Restrictions Precautions Precautions: Fall Restrictions Weight Bearing Restrictions: No      Mobility  Bed Mobility Overal bed mobility: Needs Assistance Bed Mobility: Supine to Sit, Sit to Supine     Supine to sit: Supervision Sit to supine: Min assist   General bed mobility comments: hlep  with legs, chronic LEE    Transfers Overall transfer level: Needs assistance Equipment used: Rolling walker (2 wheels) Transfers: Sit to/from Stand Sit to Stand: Supervision, Min guard            General transfer comment: heavy effort needed, better from elevated surface.    Ambulation/Gait Ambulation/Gait assistance:  (deferred to later date/ HR elevated with prolonged standing)                Stairs            Wheelchair Mobility    Modified Rankin (Stroke Patients Only)       Balance                                             Pertinent Vitals/Pain Pain Assessment Pain Assessment: No/denies pain    Home Living Family/patient expects to be discharged to:: Private residence Living Arrangements: Spouse/significant other Available Help at Discharge: Family Type of Home: House Home Access: Stairs to enter   [employer] of Steps: 7 (has a stair lift)   Home Layout: One level Home Equipment: Rollator (4 wheels);Cane - single point Additional Comments: sleeps in recliner;    Prior Function                       Hand Dominance        Extremity/Trunk Assessment                Communication      Cognition                                                General Comments      Exercises Other Exercises Other Exercises: sustained standing x2 minutes, marching in place 30sec Other Exercises: STS from elevated srface 5x 10sec   Assessment/Plan    PT Assessment Patient needs continued PT services  PT Problem List Decreased strength;Decreased activity tolerance;Decreased balance;Decreased mobility       PT Treatment Interventions DME instruction;Gait training;Stair training;Functional mobility training;Therapeutic activities;Therapeutic exercise;Balance training;Patient/family education    PT Goals (Current goals can be found in the Care Plan section)  Acute Rehab PT Goals Patient Stated Goal: improve standing tolerance, AMB PT Goal Formulation: With patient Time For Goal Achievement: 08/10/22 Potential to Achieve Goals: Fair    Frequency Min 2X/week     Co-evaluation                AM-PAC PT "6 Clicks" Mobility  Outcome Measure Help needed turning from your back to your side while in a flat bed without using bedrails?: A Lot Help needed moving from lying on your back to sitting on the side of a flat bed without using bedrails?: A Lot Help needed moving to and from a bed to a chair (including a wheelchair)?: A Little Help needed standing up from a chair using your arms (e.g., wheelchair or bedside chair)?: A Little Help needed to walk in hospital room?: A Little Help needed climbing 3-5 steps with a railing? : A Lot 6 Click Score: 15    End of Session   Activity Tolerance: Patient tolerated treatment well;Treatment limited secondary to medical complications (Comment) Patient left: in bed;with family/visitor present;with  nursing/sitter in room;with call bell/phone within reach Nurse Communication: Mobility status PT Visit Diagnosis: Unsteadiness on feet (R26.81);Other abnormalities of gait and mobility (R26.89);Muscle weakness (generalized) (M62.81)    Time: 3403-7096 PT Time Calculation (min) (ACUTE ONLY): 40 min   Charges:   PT Evaluation $PT Eval Moderate Complexity: 1 Mod PT Treatments $Therapeutic Activity: 8-22 mins       2:52 PM, 07/27/22 Etta Grandchild, PT, DPT Physical Therapist - [facility]  [phone] ([place])    Gideon C 07/27/2022, 2:00 PM

## 2022-07-27 NOTE — Assessment & Plan Note (Signed)
Continue midodrine 15 mg 3 times daily.  Add Florinef 0.1 mg daily.  A.m. cortisol level normal range.  Continue to check orthostatic vitals twice daily.  Continue working with physical therapy.

## 2022-07-27 NOTE — TOC Initial Note (Signed)
Transition of Care [facility]) - Initial/Assessment Note    Patient Details  Name: Ernest [name] MRN: [id number] Date of Birth: 1945-07-15  Transition of Care [facility]) CM/SW Contact:    Harriet Masson, RN Phone Number:[phone] 07/27/2022, 3:40 PM  Clinical Narrative:                 Spoke with pt and spouse Ernest [name]) concerning recommendations for HHPT.  Discussed [email or web address] for agencies of choice. Wife request to review agencies and will decide Monday on several agencies.   Wife states pt has a rollator and a cane. Pt able to afford his medications with sufficient transportation and a good support system. Pt also has a lift at his residence that he utilizes.   TOC will follow up Monday on choice for HHPT services.  Expected Discharge Plan: [place] Barriers to Discharge: Continued Medical Work up   Patient Goals and CMS Choice Patient states their goals for this hospitalization and ongoing recovery are:: Able to walk with rollator CMS Medicare.gov Compare Post Acute Care list provided to:: Patient Choice offered to / list presented to : Spouse      Expected Discharge Plan and Services   Discharge Planning Services: CM Consult   Living arrangements for the past 2 months: Single Family Home                                      Prior Living Arrangements/Services Living arrangements for the past 2 months: Single Family Home Lives with:: Spouse, Pets   Do you feel safe going back to the place where you live?: Yes      Need for Family Participation in Patient Care: Yes (Comment) Care giver support system in place?: Yes (comment)   Criminal Activity/Legal Involvement Pertinent to Current Situation/Hospitalization: No - Comment as needed  Activities of Daily Living Home Assistive Devices/Equipment: Gilford Rile (specify type) ADL Screening (condition at time of admission) Patient's cognitive ability adequate to safely complete daily activities?:  Yes Is the patient deaf or have difficulty hearing?: No Does the patient have difficulty seeing, even when wearing glasses/contacts?: No Does the patient have difficulty concentrating, remembering, or making decisions?: No Patient able to express need for assistance with ADLs?: No Does the patient have difficulty dressing or bathing?: No Independently performs ADLs?: No Does the patient have difficulty walking or climbing stairs?: Yes Weakness of Legs: Both Weakness of Arms/Hands: None  Permission Sought/Granted   Permission granted to share information with : Yes, Verbal Permission Granted              Emotional Assessment Appearance:: Appears stated age Attitude/Demeanor/Rapport: Engaged Affect (typically observed): Accepting Orientation: : Oriented to Self, Oriented to Place, Oriented to  Time, Oriented to Situation Alcohol / Substance Use: Not Applicable Psych Involvement: No (comment)  Admission diagnosis:  Orthostatic hypotension [I95.1] Near syncope [R55] Hypotension, unspecified hypotension type [I95.9] Recurrent syncope [R55] Patient Active Problem List   Diagnosis Date Noted   Recurrent syncope 07/26/2022   Diarrhea 07/26/2022   Chronic anticoagulation 07/26/2022   Chronic venous stasis 07/26/2022   Hypokalemia 07/26/2022   Orthostatic hypotension 07/26/2022   Hypomagnesemia 07/26/2022   Persistent atrial fibrillation ([place]) 07/14/2022   Atrial fibrillation with rapid ventricular response ([place]) 06/27/2022   Hypercoagulable state due to persistent atrial fibrillation ([place]) 06/27/2022   Chronic right-sided heart failure ([place]) 06/27/2022   Obesity hypoventilation syndrome ([place])  06/27/2022   Essential hypertension 06/27/2022   Chronic respiratory failure with hypoxia ([place]) 05/22/2022   OSA (obstructive sleep apnea) 05/22/2022   History of colonic polyps    Polyp of transverse colon    Adenomatous polyp of cecum    Partial small bowel obstruction ([place])  04/01/2021   Abdominal bloating 11/30/2020   Abdominal pain, epigastric    GERD (gastroesophageal reflux disease)    Swollen lymph nodes 07/25/2020   SVT (supraventricular tachycardia) 06/25/2020   Obesity, Class III, BMI 40-49.9 (morbid obesity) ([place]) 06/25/2020   History of umbilical hernia repair 16/83/7290   PCP:  [name], MD Pharmacy:   [id number] [place], [id number]: [phone]: [phone]    Social Determinants of Health (SDOH) Social History: SDOH Screenings   Food Insecurity: No Food Insecurity (07/26/2022)  Housing: Low Risk  (07/26/2022)  Transportation Needs: No Transportation Needs (07/26/2022)  Utilities: Not At Risk (07/26/2022)  Alcohol Screen: Low Risk  (06/21/2021)  Depression (PHQ2-9): Low Risk  (06/21/2021)  Financial Resource Strain: Low Risk  (06/21/2021)  Physical Activity: Inactive (06/21/2021)  Social Connections: Unknown (06/21/2021)  Stress: No Stress Concern Present (06/21/2021)  Tobacco Use: Low Risk  (07/26/2022)   SDOH Interventions:     Readmission Risk Interventions     No data to display

## 2022-07-27 NOTE — Progress Notes (Signed)
Progress Note   Patient: Ernest [name] KGU:[id number] DOB: August 12, 1945 DOA: 07/25/2022     1 DOS: the patient was seen and examined on 07/27/2022   Brief hospital course: 77 y.o. male with medical history significant for Class III obesity, chronic venous insufficiency of lower extremities on Lasix 40 mg 3 times a week, GERD, obesity hypoventilation syndrome and chronic respiratory failure on home O2 at 4L, HTN, recent diagnosis of A-fib, on amiodarone and Eliquis with plans for DC cardioversion later this month, chronic hypotension on midodrine started recently, who presents to the ED after having several presyncopal episodes.  Patient has been having intermittent diarrhea for the past month since he was started on the medication for his atrial fibrillation.  He has been using his midodrine as prescribed.  He denies chest pain, shortness of breath or palpitations.  He denies cough, fever or chills.  Denies vomiting or abdominal pain. ED course and data review: BP 87/61, with pulse in the 80s.  BP was briefly fluid responsive to 118/101 but then returned to SBP in the high 80s.  Labs significant for WBC 11,700.  Potassium 3.2.  Lipase and LFTs unremarkable.  Urinalysis unremarkable.EKG, personally reviewed and interpreted showed A-fib at 90 with nonspecific ST-T wave changes. Patient was treated with a 1.5 L NS bolus and started on IV potassium repletion and hospitalist consulted for admission.  2/3.  Repeat electrolytes show potassium of 2.7 magnesium 1.7.  Replace potassium IV and magnesium IV.  Patient still hypotensive and orthostatic and increased midodrine to 15 mg 3 times daily. 2/4.  Patient still orthostatic.  Continue gentle fluids and increase dose of midodrine.  Check an a.m. cortisol tomorrow morning.  Will consider starting Florinef if cortisol level okay.  Cardiology to try to set up TEE cardioversion.  Assessment and Plan: * Orthostatic hypotension Continue increased dose of midodrine  15 mg 3 times a day.  Continue IV fluids today.  Check an a.m. cortisol tomorrow morning to see if adrenally insufficient.  If that test is okay may start Florinef.  Hypomagnesemia Replaced on 2/3.  Hypokalemia Vigorous potassium replacement on 07/26/2022.  Continue oral supplementation.  Diarrhea Stool studies negative.  Could be secondary to amiodarone.  Trial of Bentyl to see if this helps.  Recurrent syncope Secondary to orthostatic hypotension  Chronic venous stasis Hold Lasix for now  Persistent atrial fibrillation [facility]) Chronic anticoagulation Cardiology will set up cardioversion on Monday possibly.  Chronic respiratory failure with hypoxia (HCC) Obesity hypoventilation syndrome Patient is intolerant of BiPAP but uses home O2 Continue home oxygen 4 L  Obesity, Class III, BMI 40-49.9 (morbid obesity) (HCC) BMI 41.39        Subjective: Patient was extremely orthostatic yesterday.  Patient feels okay.  Worked with physical therapy and did drop his pressure while standing.  Midodrine dose increased yesterday.  Came in with near syncope.  Physical Exam: Vitals:   07/27/22 0402 07/27/22 0700 07/27/22 1040 07/27/22 1219  BP:  101/70  96/65  Pulse:  (!) 53  65  Resp:  13  14  Temp:  97.9 F (36.6 C)    TempSrc:  Oral    SpO2:  100% 97%   Weight: (!) 142.3 kg     Height:       Physical Exam HENT:     Head: Normocephalic.     Mouth/Throat:     Pharynx: No oropharyngeal exudate.  Eyes:     General: Lids are normal.  Conjunctiva/sclera: Conjunctivae normal.  Cardiovascular:     Rate and Rhythm: Normal rate. Rhythm irregularly irregular.     Heart sounds: Normal heart sounds, S1 normal and S2 normal.  Pulmonary:     Breath sounds: No decreased breath sounds, wheezing, rhonchi or rales.  Abdominal:     Palpations: Abdomen is soft.     Tenderness: There is no abdominal tenderness.  Musculoskeletal:     Right lower leg: Swelling present.     Left lower leg:  Swelling present.  Skin:    General: Skin is warm.     Findings: No rash.  Neurological:     Mental Status: He is alert and oriented to person, place, and time.     Data Reviewed: Potassium 3.5, creatinine 0.97, magnesium 2.2, phosphorus 3.6, hemoglobin 10.7  Family Communication: Spoke with wife at the bedside  Disposition: Status is: Inpatient Remains inpatient appropriate because: Increase midodrine to 10 mg 3 times daily.  Will check an a.m. cortisol tomorrow morning to see if adrenally insufficient.  If that test is okay may start Florinef.  Planned Discharge Destination: Home with Home Health    Time spent: 28 minutes Case discussed with cardiology  Author: Loletha Grayer, MD 07/27/2022 1:33 PM  For on call review [email or web address].

## 2022-07-27 NOTE — Progress Notes (Signed)
Rounding Note    Patient Name: Ernest [name] Date of Encounter: 07/27/2022  [place] Cardiologist: Kate Sable, MD   Subjective   No acute events overnight. No further bowel movements. Continues to have orthostatic readings, but worked with PT today and did not feel lightheaded. Hoping for cardioversion tomorrow.  Inpatient Medications    Scheduled Meds:  amiodarone  200 mg Oral BID   apixaban  5 mg Oral BID   dicyclomine  10 mg Oral TID AC   midodrine  15 mg Oral TID with meals   potassium chloride  20 mEq Oral BID   sodium chloride flush  3 mL Intravenous Q12H   Continuous Infusions:  sodium chloride 50 mL/hr at 07/27/22 0442   sodium chloride     PRN Meds: acetaminophen **OR** acetaminophen, ondansetron **OR** ondansetron (ZOFRAN) IV   Vital Signs    Vitals:   07/27/22 0349 07/27/22 0402 07/27/22 0700 07/27/22 1040  BP: (!) 89/61  101/70   Pulse: 73  (!) 53   Resp: 16  13   Temp: 97.8 F (36.6 C)  97.9 F (36.6 C)   TempSrc: Oral  Oral   SpO2: 100%  100% 97%  Weight:  (!) 142.3 kg    Height:        Intake/Output Summary (Last 24 hours) at 07/27/2022 1144 Last data filed at 07/27/2022 0442 Gross per 24 hour  Intake 1178.26 ml  Output 300 ml  Net 878.26 ml      07/27/2022    4:02 AM 07/25/2022    8:55 PM 07/18/2022   10:46 AM  Last 3 Weights  Weight (lbs) 313 lb 11.4 oz 305 lb 324 lb  Weight (kg) 142.3 kg 138.347 kg 146.965 kg      Telemetry    Afib, low voltage - Personally Reviewed  ECG    No new since 2/2 - Personally Reviewed  Physical Exam   GEN: No acute distress.   Neck: cannot appreciate JVD 2/2 body habitus Cardiac: IRIR, distant, no murmurs, rubs, or gallops.  Respiratory: Distant but largely clear to auscultation bilaterally. GI: Soft, nontender, non-distended  MS: mild BL LE edema; No deformity. Neuro:  Nonfocal  Psych: Normal affect   Labs    High Sensitivity Troponin:   Recent Labs  Lab  07/25/22 2101  TROPONINIHS 4     Chemistry Recent Labs  Lab 07/25/22 2101 07/26/22 1100 07/27/22 0704  NA 136 135 139  K 3.2* 2.7* 3.5  CL 93* 98 102  CO2 '31 30 31  '$ GLUCOSE 104* 106* 90  BUN '17 15 12  '$ CREATININE 1.08 1.09 0.97  CALCIUM 7.8* 7.2* 7.4*  MG  --  1.7 2.2  PROT 5.8*  --   --   ALBUMIN 2.4*  --   --   AST 19  --   --   ALT 9  --   --   ALKPHOS 71  --   --   BILITOT 1.3*  --   --   GFRNONAA >60 >60 >60  ANIONGAP '12 7 6    '$ Lipids No results for input(s): "CHOL", "TRIG", "HDL", "LABVLDL", "LDLCALC", "CHOLHDL" in the last 168 hours.  Hematology Recent Labs  Lab 07/25/22 2101 07/26/22 0619 07/27/22 0704  WBC 11.7* 8.6 7.6  RBC 5.05 4.05* 4.07*  HGB 13.1 10.6* 10.7*  HCT 40.8 32.4* 33.6*  MCV 80.8 80.0 82.6  MCH 25.9* 26.2 26.3  MCHC 32.1 32.7 31.8  RDW 16.2* 16.3*  17.2*  PLT 350 252 242   Thyroid No results for input(s): "TSH", "FREET4" in the last 168 hours.  BNPNo results for input(s): "BNP", "PROBNP" in the last 168 hours.  DDimer No results for input(s): "DDIMER" in the last 168 hours.   Radiology    DG Chest Port 1 View  Result Date: 07/26/2022 CLINICAL DATA:  [id number] Syncope [id number] EXAM: PORTABLE CHEST 1 VIEW COMPARISON:  Chest x-ray 10/05/2020, CT chest 10/05/2020 FINDINGS: Stable enlarged cardiac silhouette. The heart and mediastinal contours are unchanged. Aortic calcification. No focal consolidation. No pulmonary edema. Nonspecific blunting of left costophrenic angle. Trace left pleural effusion not excluded. No right pleural effusion effusion. No pneumothorax. No acute osseous abnormality. IMPRESSION: 1. No active disease. 2. Trace left pleural effusion not excluded. 3.  Aortic Atherosclerosis (ICD10-I70.0). Electronically Signed   By: Iven Finn M.D.   On: 07/26/2022 02:27    Cardiac Studies   Echo 07/09/22  1. Challenging images.   2. Left ventricular ejection fraction, by estimation, is 60 to 65%. The  left ventricle has normal  function. The left ventricle has no regional  wall motion abnormalities. Left ventricular diastolic parameters are  indeterminate.   3. Right ventricular systolic function is normal. The right ventricular  size is mildly enlarged.   4. The mitral valve is normal in structure. No evidence of mitral valve  regurgitation. No evidence of mitral stenosis.   5. The aortic valve was not well visualized. Aortic valve regurgitation  is not visualized. No aortic stenosis is present.   6. The inferior vena cava is normal in size with greater than 50%  respiratory variability, suggesting right atrial pressure of 3 mmHg.   7. Right atrial size was mildly dilated.   8. Unable to exclude abnormal rhythm, atrial fibrillation.   Comparison(s): 01/10/21 EF 50-55%.   Patient Profile     77 y.o. male with PMH OSA/OHS on home O2, morbid obesity, persistent afib, partial SBO whom we are seeing for hypotension and atrial fibrillation.   Assessment & Plan    Hypotension Orthostasis Diarrhea -suspect acute worsening of orthostasis due to dehydration from diarrhea -I do not have a clear etiology for his profound orthostasis prior to admission. Even here, despite fluid resuscitation, remains profoundly orthostatic -continue midodrine. Agree with plan to check AM cortisol given his pattern -I am concerned that amiodarone may be at least part, if not all, of the etiology for his nausea/diarrhea, though this has improved somewhat the last two days. See below -does have history of partial SBO  Orthostatic VS for the past 24 hrs (Last 3 readings):  BP- Lying BP- Sitting Pulse- Sitting BP- Standing at 0 minutes Pulse- Standing at 0 minutes  07/27/22 1040 (!) 86/64 98/67 86 (!) 79/63 166      Persistent atrial fibrillation -was scheduled for cardioversion 07/30/22 -discussed options with him. For the short term, reasonable to continue amiodarone and try to move up cardioversion to 07/28/22. Will make him NPO at  [place] -long term, he may not tolerate amiodarone. He has an upcoming visit with Dr. Quentin Ore. Could consider tikosyn or other alternatives at that time.  -continue apixaban   Untreated OSA/OHS -has not tolerated bipap -complicates management of afib  For questions or updates, please contact [place] Please consult [email or web address] for contact info under   Signed, Buford Dresser, MD  07/27/2022, 11:44 AM

## 2022-07-28 ENCOUNTER — Encounter
Admission: EM | Disposition: A | Discharge status: Skilled Nursing Facility | Payer: Self-pay | Source: Home / Self Care | Attending: Internal Medicine

## 2022-07-28 ENCOUNTER — Inpatient Hospital Stay: Payer: Medicare Other | Admitting: Anesthesiology

## 2022-07-28 ENCOUNTER — Other Ambulatory Visit: Payer: Self-pay

## 2022-07-28 ENCOUNTER — Encounter: Payer: Self-pay | Admitting: Cardiology

## 2022-07-28 ENCOUNTER — Telehealth: Payer: Self-pay | Admitting: Cardiology

## 2022-07-28 DIAGNOSIS — I4819 Other persistent atrial fibrillation: Secondary | ICD-10-CM | POA: Diagnosis not present

## 2022-07-28 DIAGNOSIS — I951 Orthostatic hypotension: Secondary | ICD-10-CM | POA: Diagnosis not present

## 2022-07-28 HISTORY — PX: CARDIOVERSION: SHX1299

## 2022-07-28 LAB — BASIC METABOLIC PANEL
Anion gap: 7 (ref 5–15)
BUN: 13 mg/dL (ref 8–23)
CO2: 29 mmol/L (ref 22–32)
Calcium: 7.7 mg/dL — ABNORMAL LOW (ref 8.9–10.3)
Chloride: 104 mmol/L (ref 98–111)
Creatinine, Ser: 0.97 mg/dL (ref 0.61–1.24)
GFR, Estimated: >60 mL/min (ref >60)
Glucose, Bld: 91 mg/dL (ref 70–99)
Potassium: 3.6 mmol/L (ref 3.5–5.1)
Sodium: 140 mmol/L (ref 135–145)

## 2022-07-28 LAB — PROTIME-INR
INR: 1.4 — ABNORMAL HIGH (ref 0.8–1.2)
Prothrombin Time: 16.9 seconds — ABNORMAL HIGH (ref 11.4–15.2)

## 2022-07-28 LAB — CORTISOL: Cortisol, Plasma: 11.6 ug/dL

## 2022-07-28 LAB — GLUCOSE, CAPILLARY: Glucose-Capillary: 91 mg/dL (ref 70–99)

## 2022-07-28 SURGERY — CARDIOVERSION
Anesthesia: General

## 2022-07-28 MED ORDER — PHENYLEPHRINE HCL (PRESSORS) 10 MG/ML IV SOLN
INTRAVENOUS | Status: DC | PRN
  Administered 2022-07-28: 80 ug via INTRAVENOUS

## 2022-07-28 MED ORDER — PROPOFOL 10 MG/ML IV BOLUS
INTRAVENOUS | Status: DC | PRN
  Administered 2022-07-28: 70 mg via INTRAVENOUS

## 2022-07-28 MED ORDER — MIDODRINE HCL 5 MG PO TABS
10.0000 mg | ORAL_TABLET | Freq: Three times a day (TID) | ORAL | Status: DC
  Administered 2022-07-28: 10 mg via ORAL
  Filled 2022-07-28: qty 2

## 2022-07-28 MED ORDER — AMIODARONE HCL 200 MG PO TABS
200.0000 mg | ORAL_TABLET | Freq: Every day | ORAL | Status: DC
  Administered 2022-07-29 – 2022-07-31 (×3): 200 mg via ORAL
  Filled 2022-07-28 (×3): qty 1

## 2022-07-28 MED ORDER — FLUDROCORTISONE ACETATE 0.1 MG PO TABS
0.1000 mg | ORAL_TABLET | Freq: Every day | ORAL | Status: DC
  Filled 2022-07-28: qty 1

## 2022-07-28 MED ORDER — PROPOFOL 10 MG/ML IV BOLUS
INTRAVENOUS | Status: AC
  Filled 2022-07-28: qty 20

## 2022-07-28 MED ORDER — SODIUM CHLORIDE 0.9 % IV SOLN: INTRAVENOUS | Status: DC

## 2022-07-28 MED ORDER — FLUDROCORTISONE ACETATE 0.1 MG PO TABS
0.1000 mg | ORAL_TABLET | Freq: Every day | ORAL | Status: DC
  Administered 2022-07-28 – 2022-07-29 (×2): 0.1 mg via ORAL
  Filled 2022-07-28 (×2): qty 1

## 2022-07-28 NOTE — Progress Notes (Signed)
OT Cancellation Note  Patient Details Name: Ernest [name] MRN: [id number] DOB: 12/03/1945   Cancelled Treatment:    Reason Eval/Treat Not Completed: Patient at procedure or test/ unavailable. OT order received. Chart reviewed. Upon arrival to unit, pt noted to be OTF for cardioversion. Will hold at this time and re-attempt at a later date/time as available and pt medically appropriate for OT services.   Shara Blazing, M.S., OTR/L 07/28/22, 9:39 AM

## 2022-07-28 NOTE — Transfer of Care (Signed)
Immediate Anesthesia Transfer of Care Note  Patient: Ernest [name]  Procedure(s) Performed: CARDIOVERSION  Patient Location: PACU and special recoveries  Anesthesia Type:General  Level of Consciousness: drowsy and patient cooperative  Airway & Oxygen Therapy: Patient Spontanous Breathing and Patient connected to nasal cannula oxygen  Post-op Assessment: Report given to RN and Post -op Vital signs reviewed and stable  Post vital signs: Reviewed and stable  Last Vitals:  Vitals Value Taken Time  BP 89/70 07/28/22 1014  Temp    Pulse 67 07/28/22 1016  Resp 31 07/28/22 1016  SpO2 99 % 07/28/22 1016  Vitals shown include unvalidated device data.  Last Pain:  Vitals:   07/28/22 0955  TempSrc:   PainSc: 0-No pain      Patients Stated Pain Goal: 0 (23/36/12 2449)  Complications: No notable events documented.

## 2022-07-28 NOTE — Progress Notes (Signed)
Progress Note   Patient: Ernest [name]:[id number] DOB: July 09, 1945 DOA: 07/25/2022     2 DOS: the patient was seen and examined on 07/28/2022   Brief hospital course: 77 y.o. male with medical history significant for Class III obesity, chronic venous insufficiency of lower extremities on Lasix 40 mg 3 times a week, GERD, obesity hypoventilation syndrome and chronic respiratory failure on home O2 at 4L, HTN, recent diagnosis of A-fib, on amiodarone and Eliquis with plans for DC cardioversion later this month, chronic hypotension on midodrine started recently, who presents to the ED after having several presyncopal episodes.  Patient has been having intermittent diarrhea for the past month since he was started on the medication for his atrial fibrillation.  He has been using his midodrine as prescribed.  He denies chest pain, shortness of breath or palpitations.  He denies cough, fever or chills.  Denies vomiting or abdominal pain. ED course and data review: BP 87/61, with pulse in the 80s.  BP was briefly fluid responsive to 118/101 but then returned to SBP in the high 80s.  Labs significant for WBC 11,700.  Potassium 3.2.  Lipase and LFTs unremarkable.  Urinalysis unremarkable.EKG, personally reviewed and interpreted showed A-fib at 90 with nonspecific ST-T wave changes. Patient was treated with a 1.5 L NS bolus and started on IV potassium repletion and hospitalist consulted for admission.  2/3.  Repeat electrolytes show potassium of 2.7 magnesium 1.7.  Replace potassium IV and magnesium IV.  Patient still hypotensive and orthostatic and increased midodrine to 15 mg 3 times daily. 2/4.  Patient still orthostatic.  Continue gentle fluids and increase dose of midodrine.  Check an a.m. cortisol tomorrow morning.  Will consider starting Florinef if cortisol level okay.  Cardiology to try to set up TEE cardioversion. 2/5.  Cardioverted to normal sinus rhythm.  A.m. cortisol normal range.  Add Florinef to  midodrine for orthostatic hypotension.  Assessment and Plan: * Orthostatic hypotension Continue midodrine 15 mg 3 times daily.  Add Florinef 0.1 mg daily.  A.m. cortisol level normal range.  Continue to check orthostatic vitals twice daily.  Continue working with physical therapy.  Hypomagnesemia Replaced on 2/3.  Hypokalemia Vigorous potassium replacement on 07/26/2022.  Continue oral supplementation.  Diarrhea Stool studies negative.  Could be secondary to amiodarone.  Trial of Bentyl to see if this helps.  Recurrent syncope Secondary to orthostatic hypotension  Chronic venous stasis Hold Lasix for now  Persistent atrial fibrillation (HCC) Cardioverted today to normal sinus rhythm.  Need to see if this holds.  Cardiology decreased amiodarone dose down to 200 mg daily.  Continue anticoagulation with Eliquis.  Chronic respiratory failure with hypoxia (HCC) Obesity hypoventilation syndrome Patient is intolerant of BiPAP but uses home O2 Continue home oxygen 4 L  Obesity, Class III, BMI 40-49.9 (morbid obesity) (HCC) BMI 41.39        Subjective: Patient seen this morning before cardioversion.  Patient still having some rumbling in his stomach.  Continues to be orthostatic.  Physical Exam: Vitals:   07/28/22 1020 07/28/22 1030 07/28/22 1035 07/28/22 1100  BP: (!) 68/45 (!) 80/59 (!) 85/62 (!) 88/59  Pulse: 78 68 66 65  Resp: '20 12 13 19  '$ Temp:      TempSrc:      SpO2: 98% 98% 99% 99%  Weight:      Height:       Physical Exam HENT:     Head: Normocephalic.     Mouth/Throat:  Pharynx: No oropharyngeal exudate.  Eyes:     General: Lids are normal.     Conjunctiva/sclera: Conjunctivae normal.  Cardiovascular:     Rate and Rhythm: Normal rate. Rhythm irregularly irregular.     Heart sounds: Normal heart sounds, S1 normal and S2 normal.  Pulmonary:     Breath sounds: No decreased breath sounds, wheezing, rhonchi or rales.  Abdominal:     Palpations: Abdomen is  soft.     Tenderness: There is no abdominal tenderness.  Musculoskeletal:     Right lower leg: Swelling present.     Left lower leg: Swelling present.  Skin:    General: Skin is warm.     Findings: No rash.  Neurological:     Mental Status: He is alert and oriented to person, place, and time.     Data Reviewed: A.m. cortisol normal range 11.6, potassium 3.6  Family Communication: Spoke with wife at the bedside  Disposition: Status is: Inpatient Remains inpatient appropriate because: Cardioversion today.  Being treated for orthostatic hypotension.  Adding Florinef today.  Planned Discharge Destination: Home with Home Health    Time spent: 28 minutes  Author: Loletha Grayer, MD 07/28/2022 11:46 AM  For on call review [email or web address].

## 2022-07-28 NOTE — Progress Notes (Signed)
Arrived back from Special Procedures at this time     07/28/22 1100  Vitals  BP (!) 88/59  MAP (mmHg) 69  BP Location Left Arm  BP Method Automatic  Patient Position (if appropriate) Lying  Pulse Rate 65  Pulse Rate Source Monitor  ECG Heart Rate 68  Resp 19  MEWS COLOR  MEWS Score Color Green  Oxygen Therapy  SpO2 99 %  O2 Device Nasal Cannula  O2 Flow Rate (L/min) 2 L/min  MEWS Score  MEWS Temp 0  MEWS Systolic 1  MEWS Pulse 0  MEWS RR 0  MEWS LOC 0  MEWS Score 1

## 2022-07-28 NOTE — Progress Notes (Signed)
Attempted orthostatics with patient, unable to obtain at this time due to patient intolerance of activity and lightheadedness with standing.

## 2022-07-28 NOTE — Progress Notes (Signed)
2040 patient alert x4 able to make all needs known on 4L [place]

## 2022-07-28 NOTE — Telephone Encounter (Signed)
Spoke with Barb at scheduling and informed her of the patient having the procedure completed early and needing to cancel the scheduled one. Raford Pitcher stated that they would remove him from the schedule.

## 2022-07-28 NOTE — Telephone Encounter (Signed)
Pt states that he needs to cancel the upcoming appt for the cardioversion due to having had the procedure this morning 02/05 with Dr. Fletcher Anon.

## 2022-07-28 NOTE — Evaluation (Signed)
Occupational Therapy Evaluation Patient Details Name: Ernest [name] MRN: [id number] DOB: 07-25-1945 Today's Date: 07/28/2022   History of Present Illness 76yoM Ernest [name], comes to [facility] 2/2 after several 4-5 near syncopal episodes preceding day and 5 days diarrhea. Pt started on midodrine 2 weeks prior due to similar problem, pt notes success with meds. Pt followed by cardiology, consulting this admission, questions regarding symptoms attributed to amiodarone, pt previously scheduled for cardioversion 07/30/22. is now s/p cardioversion as of 07/28/22   Clinical Impression   Ernest [name] was seen for OT evaluation this date. Prior to hospital admission, pt was MOD I for ADL management. He endorses using a 4WW for household/community mobility as well as being able to bathe and dress himself independently. Pt lives with his spouse in a 1 level home with ~ 7 STE (has stair lift). Pt presents to acute OT demonstrating impaired ADL performance and functional mobility 2/2 generalized weakness, decreased activity tolerance, and orthostatic hypotension (See OT problem list). Pt currently requires MIN A for STS at EOB. He fatigues quickly with minimal functional activity and requires MAX A for LB ADL management from STS. Pt would benefit from skilled OT services to address noted impairments and functional limitations (see below for any additional details) in order to maximize safety and independence while minimizing falls risk and caregiver burden. Upon hospital discharge, recommend STR to maximize pt safety and return to PLOF.      Recommendations for follow up therapy are one component of a multi-disciplinary discharge planning process, led by the attending physician.  Recommendations may be updated based on patient status, additional functional criteria and insurance authorization.   Follow Up Recommendations  Skilled nursing-short term rehab (<3 hours/day)     Assistance Recommended at Discharge  Intermittent Supervision/Assistance  Patient can return home with the following A lot of help with bathing/dressing/bathroom;A lot of help with walking and/or transfers;Assistance with cooking/housework;Help with stairs or ramp for entrance;Assist for transportation    Functional Status Assessment  Patient has had a recent decline in their functional status and demonstrates the ability to make significant improvements in function in a reasonable and predictable amount of time.  Equipment Recommendations  BSC/3in1 (Bari BSC, Bari RW)    Recommendations for Other Services       Precautions / Restrictions Precautions Precautions: Fall Restrictions Weight Bearing Restrictions: No      Mobility Bed Mobility Overal bed mobility: Needs Assistance Bed Mobility: Supine to Sit, Sit to Supine     Supine to sit: Min guard, HOB elevated          Transfers Overall transfer level: Needs assistance Equipment used: Rolling walker (2 wheels) Transfers: Sit to/from Stand Sit to Stand: Min assist, From elevated surface           General transfer comment: heavy effort needed, better from elevated surface.      Balance Overall balance assessment: Needs assistance Sitting-balance support: Feet supported, Single extremity supported Sitting balance-Ernest [name] Scale: Fair Sitting balance - Comments: Tends to tripod sit, steady sitting, reaching inside BOS.   Standing balance support: Reliant on assistive device for balance, During functional activity, Bilateral upper extremity supported Standing balance-Ernest [name] Scale: Fair                             ADL either performed or assessed with clinical judgement   ADL Overall ADL's : Needs assistance/impaired  General ADL Comments: Pt is functionally limited by generalized weakness, decreased activity tolerance and cardiopulmonary status. He performs bed mobility and STS with close  min guard for safety. He is able to stand pivot to scale for nsg to complete weight check. While on scale, He fatigues/becomes dizzy with standing after ~2 minutes and requies MIN assist to return to sitting position in chair. Anticipate MOD A for LB ADL management from STS, MAX A for bathing.     Vision Patient Visual Report: No change from baseline       Perception     Praxis      Pertinent Vitals/Pain Pain Assessment Pain Assessment: No/denies pain     Hand Dominance     Extremity/Trunk Assessment Upper Extremity Assessment Upper Extremity Assessment: Generalized weakness   Lower Extremity Assessment Lower Extremity Assessment: Generalized weakness       Communication Communication Communication: No difficulties   Cognition Arousal/Alertness: Awake/alert Behavior During Therapy: WFL for tasks assessed/performed Overall Cognitive Status: Within Functional Limits for tasks assessed                                 General Comments: Pleasant, conversational, eager to participate in therapy.     General Comments       Exercises Other Exercises Other Exercises: Pt/caregiver educated on role of OT in acute setting, safe use of AE/DME for ADL management, falls prevention strategies, and routines modifications to support safety and functional independence during ADL management. Time to monitor orthostatic vitals. See nsg doccumentation.   Shoulder Instructions      Home Living Family/patient expects to be discharged to:: Private residence Living Arrangements: Spouse/significant other Available Help at Discharge: Family;Available 24 hours/day Type of Home: House Home Access: Stairs to enter [employer] of Steps: 7 (has a stair lift)   Home Layout: One level           Bathroom Accessibility: Yes How Accessible: Accessible via walker Home Equipment: Rollator (4 wheels);Cane - single point          Prior Functioning/Environment Prior  Level of Function : Independent/Modified Independent;History of Falls (last six months)             Mobility Comments: Uses [phone] for household and community distances. 3 falls in the last 6 months. ADLs Comments: Generally mod I for ADL management, sleeps in recliner and uses 4WW for household distances. Spouse at bedside states pt has required increased assist 2/2 hypotension over last several weeks.        OT Problem List: Decreased strength;Cardiopulmonary status limiting activity;Decreased coordination;Decreased activity tolerance;Decreased safety awareness;Impaired balance (sitting and/or standing);Decreased knowledge of use of DME or AE      OT Treatment/Interventions: Self-care/ADL training;Therapeutic exercise;Therapeutic activities;DME and/or AE instruction;Patient/family education;Balance training;Energy conservation    OT Goals(Current goals can be found in the care plan section) Acute Rehab OT Goals Patient Stated Goal: To improve strength/independence OT Goal Formulation: With patient Time For Goal Achievement: 08/11/22 Potential to Achieve Goals: Good ADL Goals Pt Will Perform Grooming: sitting;with modified independence Pt Will Perform Lower Body Dressing: sit to/from stand;with supervision;with set-up;with adaptive equipment Pt Will Transfer to Toilet: bedside commode;ambulating;with supervision;with set-up Pt Will Perform Toileting - Clothing Manipulation and hygiene: with set-up;with supervision;sit to/from stand  OT Frequency: Min 2X/week    Co-evaluation              AM-PAC OT "6 Clicks" Daily Activity  Outcome Measure Help from another person eating meals?: None Help from another person taking care of personal grooming?: A Little Help from another person toileting, which includes using toliet, bedpan, or urinal?: A Lot Help from another person bathing (including washing, rinsing, drying)?: A Lot Help from another person to put on and taking off  regular upper body clothing?: A Little Help from another person to put on and taking off regular lower body clothing?: A Lot 6 Click Score: 16   End of Session Equipment Utilized During Treatment: Gait belt;Rolling walker (2 wheels)  Activity Tolerance: Patient tolerated treatment well Patient left: in chair;with call bell/phone within reach;with chair alarm set  OT Visit Diagnosis: Other abnormalities of gait and mobility (R26.89);Muscle weakness (generalized) (M62.81)                Time: 7681-1572 OT Time Calculation (min): 39 min Charges:  OT General Charges $OT Visit: 1 Visit OT Evaluation $OT Eval Moderate Complexity: 1 Mod OT Treatments $Self Care/Home Management : 23-37 mins  Shara Blazing, M.S., OTR/L 07/28/22, 3:45 PM

## 2022-07-28 NOTE — Telephone Encounter (Signed)
Patient is currently admitted in the hospital.

## 2022-07-28 NOTE — CV Procedure (Signed)
Cardioversion note: A standard informed consent was obtained. Timeout was performed. The pads were placed in the anterior posterior fashion. The patient was given propofol by the anesthesia team.  Successful cardioversion was performed with a 200 J. The patient converted to sinus rhythm with PACs Pre-and post EKGs were reviewed. The patient tolerated the procedure with no immediate complications.  Recommendations: Continue same medications for now.

## 2022-07-28 NOTE — Anesthesia Postprocedure Evaluation (Signed)
Anesthesia Post Note  Patient: Ernest [name]  Procedure(s) Performed: CARDIOVERSION  Patient location during evaluation: PACU Anesthesia Type: General Level of consciousness: awake Pain management: satisfactory to patient Vital Signs Assessment: post-procedure vital signs reviewed and stable Respiratory status: spontaneous breathing, respiratory function stable and patient connected to nasal cannula oxygen Cardiovascular status: stable Anesthetic complications: no  No notable events documented.   Last Vitals:  Vitals:   07/28/22 1014 07/28/22 1015  BP: (!) 89/70   Pulse: 79 74  Resp: (!) 22 (!) 23  Temp:    SpO2: 93% 99%    Last Pain:  Vitals:   07/28/22 0955  TempSrc:   PainSc: 0-No pain                 VAN STAVEREN,Courteny Egler

## 2022-07-28 NOTE — Plan of Care (Signed)
  Problem: Education: Goal: Knowledge of condition and prescribed therapy will improve Outcome: Progressing   Problem: Cardiac: Goal: Will achieve and/or maintain adequate cardiac output Outcome: Progressing   Problem: Physical Regulation: Goal: Complications related to the disease process, condition or treatment will be avoided or minimized Outcome: Progressing   Problem: Education: Goal: Knowledge of General Education information will improve Description: Including pain rating scale, medication(s)/side effects and non-pharmacologic comfort measures Outcome: Progressing   Problem: Health Behavior/Discharge Planning: Goal: Ability to manage health-related needs will improve Outcome: Progressing   Problem: Clinical Measurements: Goal: Ability to maintain clinical measurements within normal limits will improve Outcome: Progressing Goal: Will remain free from infection Outcome: Progressing Goal: Diagnostic test results will improve Outcome: Progressing Goal: Respiratory complications will improve Outcome: Progressing Goal: Cardiovascular complication will be avoided Outcome: Progressing   Problem: Activity: Goal: Risk for activity intolerance will decrease Outcome: Progressing   Problem: Nutrition: Goal: Adequate nutrition will be maintained Outcome: Progressing   Problem: Elimination: Goal: Will not experience complications related to bowel motility Outcome: Progressing Goal: Will not experience complications related to urinary retention Outcome: Progressing   Problem: Pain Managment: Goal: General experience of comfort will improve Outcome: Progressing   Problem: Safety: Goal: Ability to remain free from injury will improve Outcome: Progressing   Problem: Skin Integrity: Goal: Risk for impaired skin integrity will decrease Outcome: Progressing  Patient reports no pain able to ambulate to [facility]

## 2022-07-28 NOTE — Anesthesia Preprocedure Evaluation (Signed)
Anesthesia Evaluation  Patient identified by MRN, date of birth, ID band Patient awake    Reviewed: Allergy & Precautions, NPO status , Patient's Chart, lab work & pertinent test results  Airway Mallampati: III  TM Distance: >3 FB Neck ROM: full    Dental  (+) Chipped   Pulmonary neg pulmonary ROS, shortness of breath and Long-Term Oxygen Therapy, sleep apnea    Pulmonary exam normal        Cardiovascular Exercise Tolerance: Poor hypertension, Pt. on medications negative cardio ROS Normal cardiovascular exam+ dysrhythmias Atrial Fibrillation      Neuro/Psych negative neurological ROS  negative psych ROS   GI/Hepatic negative GI ROS, Neg liver ROS,GERD  ,,  Endo/Other  negative endocrine ROS  Morbid obesity  Renal/GU negative Renal ROS  negative genitourinary   Musculoskeletal   Abdominal   Peds negative pediatric ROS (+)  Hematology negative hematology ROS (+) Blood dyscrasia, anemia   Anesthesia Other Findings Past Medical History: No date: Anemia No date: Arthritis     Comment:  osteoarthritis - bilateral knees No date: Chronic venous insufficiency of lower extremity No date: Diastolic dysfunction     Comment:  a. 12/2020 Echo: EF 50-55%, GrI DD; b. 06/2022 Echo: EF               60-65%, no rwma, nl RV fxn, nl LA size, mildly dil RA. No date: Dyspnea No date: Essential hypertension No date: GERD (gastroesophageal reflux disease) No date: Hypotension     Comment:  a. 06/2022 midodrine added. No date: Obesity hypoventilation syndrome (HCC) No date: Persistent atrial fibrillation (HCC)     Comment:  a. Dx 06/2022-->amio/eliquis. CHA2DS2VASc = 3. No date: Sleep apnea 2022: Small bowel obstruction (HCC) No date: Uses roller walker  Past Surgical History: 08/06/2020: CATARACT EXTRACTION W/PHACO; Left     Comment:  Procedure: CATARACT EXTRACTION PHACO AND INTRAOCULAR               LENS PLACEMENT ([place]) LEFT;   Surgeon: Eulogio Bear,               MD;  Location: [place];  Service:               Ophthalmology;  Laterality: Left;  3.41 0:31.5 08/27/2020: CATARACT EXTRACTION W/PHACO; Right     Comment:  Procedure: CATARACT EXTRACTION PHACO AND INTRAOCULAR               LENS PLACEMENT ([facility]) RIGHT 4.10 00:41.6;  Surgeon: Eulogio Bear, MD;  Location: [place];                Service: Ophthalmology;  Laterality: Right; No date: COLONOSCOPY WITH ESOPHAGOGASTRODUODENOSCOPY (EGD) 11/01/2020: COLONOSCOPY WITH PROPOFOL; N/A     Comment:  Procedure: COLONOSCOPY WITH PROPOFOL;  Surgeon: Lin Landsman, MD;  Location: [place];  Service:               Gastroenterology;  Laterality: N/A; 03/18/2022: COLONOSCOPY WITH PROPOFOL; N/A     Comment:  Procedure: COLONOSCOPY WITH BIOPSIES;  Surgeon: Lin Landsman, MD;  Location: [place];                Service: Endoscopy;  Laterality: N/A; 11/01/2020: ESOPHAGOGASTRODUODENOSCOPY (EGD)  WITH PROPOFOL; N/A     Comment:  Procedure: ESOPHAGOGASTRODUODENOSCOPY (EGD) WITH               PROPOFOL;  Surgeon: Lin Landsman, MD;  Location:               [facility];  Service: Gastroenterology;  Laterality:               N/A; No date: HERNIA REPAIR     Comment:  inguinal and umbilical 07/11/4172: POLYPECTOMY     Comment:  Procedure: POLYPECTOMY;  Surgeon: Lin Landsman,               MD;  Location: [place];  Service: Endoscopy;;  BMI    Body Mass Index: 41.39 kg/m      Reproductive/Obstetrics negative OB ROS                             Anesthesia Physical Anesthesia Plan  ASA: 3  Anesthesia Plan: General   Post-op Pain Management:    Induction: Intravenous  PONV Risk Score and Plan: Propofol infusion and TIVA  Airway Management Planned: Natural Airway and Nasal Cannula  Additional Equipment:   Intra-op Plan:    Post-operative Plan:   Informed Consent: I have reviewed the patients History and Physical, chart, labs and discussed the procedure including the risks, benefits and alternatives for the proposed anesthesia with the patient or authorized representative who has indicated his/her understanding and acceptance.     Dental Advisory Given  Plan Discussed with: CRNA and Surgeon  Anesthesia Plan Comments:        Anesthesia Quick Evaluation

## 2022-07-28 NOTE — TOC Progression Note (Signed)
Transition of Care [facility]) - Progression Note    Patient Details  Name: Ernest [name] MRN: [id number] Date of Birth: May 11, 1946  Transition of [facility] CM/SW [place], LCSW Phone Number: 07/28/2022, 1:51 PM  Clinical Narrative:   Patient and wife do not have home health agency preference. Will start search.  Expected Discharge Plan: [place] Barriers to Discharge: Continued Medical Work up  Expected Discharge Plan and Services   Discharge Planning Services: CM Consult   Living arrangements for the past 2 months: Single Family Home                                       Social Determinants of Health (SDOH) Interventions SDOH Screenings   Food Insecurity: No Food Insecurity (07/26/2022)  Housing: Low Risk  (07/26/2022)  Transportation Needs: No Transportation Needs (07/26/2022)  Utilities: Not At Risk (07/26/2022)  Alcohol Screen: Low Risk  (06/21/2021)  Depression (PHQ2-9): Low Risk  (06/21/2021)  Financial Resource Strain: Low Risk  (06/21/2021)  Physical Activity: Inactive (06/21/2021)  Social Connections: Unknown (06/21/2021)  Stress: No Stress Concern Present (06/21/2021)  Tobacco Use: Low Risk  (07/26/2022)    Readmission Risk Interventions     No data to display

## 2022-07-28 NOTE — Progress Notes (Signed)
Rounding Note    Patient Name: Ernest [name] Date of Encounter: 07/28/2022  [place] Cardiologist: Kate Sable, MD   Subjective   He underwent successful cardioversion this morning.  His GI symptoms improved overall.  Inpatient Medications    Scheduled Meds:  amiodarone  200 mg Oral BID   apixaban  5 mg Oral BID   dicyclomine  10 mg Oral TID AC   fludrocortisone  0.1 mg Oral Daily   midodrine  15 mg Oral TID with meals   potassium chloride  20 mEq Oral BID   sodium chloride flush  3 mL Intravenous Q12H   Continuous Infusions:  sodium chloride     PRN Meds: acetaminophen **OR** acetaminophen, ondansetron **OR** ondansetron (ZOFRAN) IV   Vital Signs    Vitals:   07/28/22 1020 07/28/22 1030 07/28/22 1035 07/28/22 1100  BP: (!) 68/45 (!) 80/59 (!) 85/62 (!) 88/59  Pulse: 78 68 66 65  Resp: '20 12 13 19  '$ Temp:      TempSrc:      SpO2: 98% 98% 99% 99%  Weight:      Height:        Intake/Output Summary (Last 24 hours) at 07/28/2022 1113 Last data filed at 07/28/2022 0839 Gross per 24 hour  Intake 1628.39 ml  Output 950 ml  Net 678.39 ml       07/27/2022    4:02 AM 07/25/2022    8:55 PM 07/18/2022   10:46 AM  Last 3 Weights  Weight (lbs) 313 lb 11.4 oz 305 lb 324 lb  Weight (kg) 142.3 kg 138.347 kg 146.965 kg      Telemetry    Afib, low voltage.  Converted to normal sinus rhythm with PACs- Personally Reviewed  ECG    No new since 2/2 - Personally Reviewed  Physical Exam   GEN: No acute distress.   Neck: cannot appreciate JVD 2/2 body habitus Cardiac: Regular rate and rhythm, distant, no murmurs, rubs, or gallops.  Respiratory: Distant but largely clear to auscultation bilaterally. GI: Soft, nontender, non-distended  MS: mild BL LE edema; No deformity. Neuro:  Nonfocal  Psych: Normal affect   Labs    High Sensitivity Troponin:   Recent Labs  Lab 07/25/22 2101  TROPONINIHS 4      Chemistry Recent Labs  Lab  07/25/22 2101 07/26/22 1100 07/27/22 0704 07/28/22 0601  NA 136 135 139 140  K 3.2* 2.7* 3.5 3.6  CL 93* 98 102 104  CO2 '31 30 31 29  '$ GLUCOSE 104* 106* 90 91  BUN '17 15 12 13  '$ CREATININE 1.08 1.09 0.97 0.97  CALCIUM 7.8* 7.2* 7.4* 7.7*  MG  --  1.7 2.2  --   PROT 5.8*  --   --   --   ALBUMIN 2.4*  --   --   --   AST 19  --   --   --   ALT 9  --   --   --   ALKPHOS 71  --   --   --   BILITOT 1.3*  --   --   --   GFRNONAA >60 >60 >60 >60  ANIONGAP '12 7 6 7     '$ Lipids No results for input(s): "CHOL", "TRIG", "HDL", "LABVLDL", "LDLCALC", "CHOLHDL" in the last 168 hours.  Hematology Recent Labs  Lab 07/25/22 2101 07/26/22 0619 07/27/22 0704  WBC 11.7* 8.6 7.6  RBC 5.05 4.05* 4.07*  HGB 13.1 10.6* 10.7*  HCT 40.8 32.4* 33.6*  MCV 80.8 80.0 82.6  MCH 25.9* 26.2 26.3  MCHC 32.1 32.7 31.8  RDW 16.2* 16.3* 17.2*  PLT 350 252 242    Thyroid No results for input(s): "TSH", "FREET4" in the last 168 hours.  BNPNo results for input(s): "BNP", "PROBNP" in the last 168 hours.  DDimer No results for input(s): "DDIMER" in the last 168 hours.   Radiology    No results found.  Cardiac Studies   Echo 07/09/22  1. Challenging images.   2. Left ventricular ejection fraction, by estimation, is 60 to 65%. The  left ventricle has normal function. The left ventricle has no regional  wall motion abnormalities. Left ventricular diastolic parameters are  indeterminate.   3. Right ventricular systolic function is normal. The right ventricular  size is mildly enlarged.   4. The mitral valve is normal in structure. No evidence of mitral valve  regurgitation. No evidence of mitral stenosis.   5. The aortic valve was not well visualized. Aortic valve regurgitation  is not visualized. No aortic stenosis is present.   6. The inferior vena cava is normal in size with greater than 50%  respiratory variability, suggesting right atrial pressure of 3 mmHg.   7. Right atrial size was mildly  dilated.   8. Unable to exclude abnormal rhythm, atrial fibrillation.   Comparison(s): 01/10/21 EF 50-55%.   Patient Profile     77 y.o. male with PMH OSA/OHS on home O2, morbid obesity, persistent afib, partial SBO whom we are seeing for hypotension and atrial fibrillation.   Assessment & Plan    1.  Orthostatic hypotension: Likely worsened by volume depletion and atrial fibrillation.  Will see how he responds post cardioversion.  I decreased midodrine to 10 mg 3 times daily.   2.  Persistent atrial fibrillation: Status post successful cardioversion to sinus rhythm this morning.  It is possible that some of his GI symptoms might be related to initiation of amiodarone last month.  However, he reports improvement in symptoms over the last 2 days although did not resolve completely.  Will decrease amiodarone to 200 mg once daily and monitor for now. Continue anticoagulation with Eliquis. -long term, he may not tolerate amiodarone. He has an upcoming visit with Dr. Quentin Ore. Could consider tikosyn or other alternatives at that time.  He is likely a poor candidate for ablation given his morbid obesity.   3. Untreated OSA/OHS -has not tolerated bipap -complicates management of afib  For questions or updates, please contact [place] Please consult [email or web address] for contact info under   Signed, Kathlyn [place], MD  07/28/2022, 11:13 AM

## 2022-07-28 NOTE — Anesthesia Procedure Notes (Signed)
Procedure Name: MAC Date/Time: 07/28/2022 10:09 AM  Performed by: Jerrye Noble, CRNAPre-anesthesia Checklist: Patient identified, Emergency Drugs available, Suction available and Patient being monitored Patient Re-evaluated:Patient Re-evaluated prior to induction Oxygen Delivery Method: Nasal cannula

## 2022-07-29 ENCOUNTER — Encounter: Payer: Self-pay | Admitting: Cardiovascular Disease

## 2022-07-29 DIAGNOSIS — I4819 Other persistent atrial fibrillation: Secondary | ICD-10-CM | POA: Diagnosis not present

## 2022-07-29 DIAGNOSIS — I951 Orthostatic hypotension: Secondary | ICD-10-CM | POA: Diagnosis not present

## 2022-07-29 LAB — GLUCOSE, CAPILLARY: Glucose-Capillary: 81 mg/dL (ref 70–99)

## 2022-07-29 MED ORDER — FLUDROCORTISONE ACETATE 0.1 MG PO TABS
0.2000 mg | ORAL_TABLET | Freq: Every day | ORAL | Status: DC
  Administered 2022-07-30 – 2022-08-01 (×3): 0.2 mg via ORAL
  Filled 2022-07-29 (×3): qty 2

## 2022-07-29 MED ORDER — ALUM & MAG HYDROXIDE-SIMETH 200-200-20 MG/5ML PO SUSP
30.0000 mL | Freq: Once | ORAL | Status: AC
  Administered 2022-07-29: 30 mL via ORAL
  Filled 2022-07-29: qty 30

## 2022-07-29 MED ORDER — PRESERVISION AREDS 2 PO CAPS: 1.0000 | ORAL_CAPSULE | Freq: Every morning | ORAL | Status: DC

## 2022-07-29 MED ORDER — PROSIGHT PO TABS
1.0000 | ORAL_TABLET | Freq: Every day | ORAL | Status: DC
  Administered 2022-07-30 – 2022-08-01 (×3): 1 via ORAL
  Filled 2022-07-29 (×3): qty 1

## 2022-07-29 MED ORDER — MIDODRINE HCL 5 MG PO TABS
15.0000 mg | ORAL_TABLET | Freq: Three times a day (TID) | ORAL | Status: DC
  Administered 2022-07-29 – 2022-08-01 (×11): 15 mg via ORAL
  Filled 2022-07-29 (×11): qty 3

## 2022-07-29 MED ORDER — SODIUM CHLORIDE 0.9 % IV SOLN: INTRAVENOUS | Status: DC

## 2022-07-29 NOTE — Progress Notes (Signed)
Progress Note   Patient: Ernest [name] LKG:[id number] DOB: Nov 13, 1945 DOA: 07/25/2022     3 DOS: the patient was seen and examined on 07/29/2022   Brief hospital course: 77 y.o. male with medical history significant for Class III obesity, chronic venous insufficiency of lower extremities on Lasix 40 mg 3 times a week, GERD, obesity hypoventilation syndrome and chronic respiratory failure on home O2 at 4L, HTN, recent diagnosis of A-fib, on amiodarone and Eliquis with plans for DC cardioversion later this month, chronic hypotension on midodrine started recently, who presents to the ED after having several presyncopal episodes.  Patient has been having intermittent diarrhea for the past month since he was started on the medication for his atrial fibrillation.  He has been using his midodrine as prescribed.  He denies chest pain, shortness of breath or palpitations.  He denies cough, fever or chills.  Denies vomiting or abdominal pain. ED course and data review: BP 87/61, with pulse in the 80s.  BP was briefly fluid responsive to 118/101 but then returned to SBP in the high 80s.  Labs significant for WBC 11,700.  Potassium 3.2.  Lipase and LFTs unremarkable.  Urinalysis unremarkable.EKG, personally reviewed and interpreted showed A-fib at 90 with nonspecific ST-T wave changes. Patient was treated with a 1.5 L NS bolus and started on IV potassium repletion and hospitalist consulted for admission.  2/3.  Repeat electrolytes show potassium of 2.7 magnesium 1.7.  Replace potassium IV and magnesium IV.  Patient still hypotensive and orthostatic and increased midodrine to 15 mg 3 times daily. 2/4.  Patient still orthostatic.  Continue gentle fluids and increase dose of midodrine.  Check an a.m. cortisol tomorrow morning.  Will consider starting Florinef if cortisol level okay.  Cardiology to try to set up TEE cardioversion. 2/5.  Cardioverted to normal sinus rhythm.  A.m. cortisol normal range.  Add Florinef to  midodrine for orthostatic hypotension. 2/6.  Patient still in normal sinus rhythm after cardioversion yesterday.  Still awaiting orthostatic vitals today.  Occupational therapy recommending rehab.  Assessment and Plan: * Orthostatic hypotension Continue midodrine 15 mg 3 times daily.  Add Florinef 0.1 mg daily.  A.m. cortisol level normal range.  Continue to check orthostatic vitals twice daily.  Continue working with physical therapy.  Hypomagnesemia Replaced on 2/3.  Hypokalemia Vigorous potassium replacement on 07/26/2022.  Continue oral supplementation.  Diarrhea Stool studies negative.  Could be secondary to amiodarone.  Trial of Bentyl to see if this helps.  Recurrent syncope Secondary to orthostatic hypotension  Chronic venous stasis Hold Lasix for now  Persistent atrial fibrillation (HCC) Cardioverted 2/5 to normal sinus rhythm.  Cardiology decreased amiodarone dose down to 200 mg daily.  Continue anticoagulation with Eliquis.  Chronic respiratory failure with hypoxia (HCC) Obesity hypoventilation syndrome Patient is intolerant of BiPAP but uses home O2 Continue home oxygen 4 L  Obesity, Class III, BMI 40-49.9 (morbid obesity) (HCC) BMI 41.04        Subjective: Patient feeling a little bit better.  He will have his wife bring in his compression stockings.  Complained of a little foot pain especially with standing.  Still waiting on orthostatics from today.  Physical Exam: Vitals:   07/29/22 0030 07/29/22 0400 07/29/22 1234 07/29/22 1426  BP: 97/66 (!) 92/58 (!) 98/58 98/67  Pulse: 72 65 67 65  Resp: '20  20 16  '$ Temp: 98.1 F (36.7 C) 97.8 F (36.6 C) 98.5 F (36.9 C) 97.8 F (36.6 C)  TempSrc:  Oral Oral Oral  SpO2: 99% 100% 100% 100%  Weight:      Height:       Physical Exam HENT:     Head: Normocephalic.     Mouth/Throat:     Pharynx: No oropharyngeal exudate.  Eyes:     General: Lids are normal.     Conjunctiva/sclera: Conjunctivae normal.   Cardiovascular:     Rate and Rhythm: Normal rate and regular rhythm.     Heart sounds: Normal heart sounds, S1 normal and S2 normal.  Pulmonary:     Breath sounds: No decreased breath sounds, wheezing, rhonchi or rales.  Abdominal:     Palpations: Abdomen is soft.     Tenderness: There is no abdominal tenderness.  Musculoskeletal:     Right lower leg: Swelling present.     Left lower leg: Swelling present.     Comments: Good range of motion in right ankle.  Skin:    General: Skin is warm.     Findings: No rash.     Comments: Chronic lower extremity skin discoloration  Neurological:     Mental Status: He is alert and oriented to person, place, and time.     Data Reviewed: A.m. cortisol of 11.6  Family Communication: Updated wife yesterday  Disposition: Status is: Inpatient Remains inpatient appropriate because: Continue to monitor orthostatic vitals.  On increased dose of midodrine 15 mg 3 times daily and Florinef.  Planned Discharge Destination: Rehab    Time spent: 27 minutes  Author: Loletha Grayer, MD 07/29/2022 2:30 PM  For on call review [email or web address].

## 2022-07-29 NOTE — Progress Notes (Signed)
Physical Therapy Treatment Patient Details Name: Ernest [name] MRN: [id number] DOB: April 06, 1946 Today's Date: 07/29/2022   History of Present Illness 76yoM Ernest [name], II, comes to [facility] 2/2 after several 4-5 near syncopal episodes preceding day and 5 days diarrhea. Pt started on midodrine 2 weeks prior due to similar problem, pt notes success with meds. Pt followed by cardiology, consulting this admission, questions regarding symptoms attributed to amiodarone, pt previously scheduled for cardioversion 07/30/22. Pt is now s/p cardioversion as of 07/28/22.    PT Comments    Pt was pleasant and motivated to participate during the session and put forth good effort throughout.  Orthostatic BP's and (HR) taken as follows: Supine 91/66 (62), Sitting 102/67 (74), and Standing 79/53 (150).  Pt was able to stand long enough to finish his standing BP with significant onset of dizziness/lightheadedness taking place as standing BP ended with pt reporting "I feel like I'm going to pass out".  Pt returned to sitting with symptoms resolving quickly and HR returning to low 80s after around 15-30 sec.  Pt required the bed to be elevated and to pull up from the sink to come to standing and reported feeling significantly weaker than his recent baseline. Pt also reported feeling that his orthostatic symptoms are occurring sooner than at baseline and is concerned about being able to safely manage at home at this time.  Pt will benefit from PT services in a SNF setting upon discharge to safely address deficits listed in patient problem list for decreased caregiver assistance and eventual return to PLOF.      Recommendations for follow up therapy are one component of a multi-disciplinary discharge planning process, led by the attending physician.  Recommendations may be updated based on patient status, additional functional criteria and insurance authorization.  Follow Up Recommendations  Skilled nursing-short term rehab (<3  hours/day) Can patient physically be transported by private vehicle: No   Assistance Recommended at Discharge Frequent or constant Supervision/Assistance  Patient can return home with the following A lot of help with bathing/dressing/bathroom;Assist for transportation;Help with stairs or ramp for entrance;Assistance with cooking/housework;Two people to help with walking and/or transfers   Equipment Recommendations  None recommended by PT    Recommendations for Other Services       Precautions / Restrictions Precautions Precautions: Fall Restrictions Weight Bearing Restrictions: No Other Position/Activity Restrictions: Orthostatic hypotension     Mobility  Bed Mobility Overal bed mobility: Needs Assistance       Supine to sit: HOB elevated, Supervision Sit to supine: Min assist   General bed mobility comments: Min A for LE management during sit to sup    Transfers Overall transfer level: Needs assistance Equipment used: Rolling walker (2 wheels) Transfers: Sit to/from Stand Sit to Stand: Min assist, From elevated surface           General transfer comment: Mod verbal cues for increased trunk flex with pt requiring heavy effort and pull from sink counter to come to standing along with min A and the bed elevated    Ambulation/Gait               General Gait Details: deferred secondary to orthostatic hypotension   Stairs             Wheelchair Mobility    Modified Rankin (Stroke Patients Only)       Balance Overall balance assessment: Needs assistance Sitting-balance support: Feet supported, Single extremity supported Sitting balance-Leahy Scale: Good  Standing balance support: Reliant on assistive device for balance, During functional activity, Bilateral upper extremity supported Standing balance-Leahy Scale: Fair                              Cognition Arousal/Alertness: Awake/alert Behavior During Therapy: WFL for tasks  assessed/performed Overall Cognitive Status: Within Functional Limits for tasks assessed                                          Exercises Total Joint Exercises Ankle Circles/Pumps: AROM, Strengthening, Both, 10 reps Quad Sets: Strengthening, Both, 10 reps Gluteal Sets: Strengthening, Both, 10 reps Hip ABduction/ADduction: AAROM, Strengthening, Both, 5 reps Straight Leg Raises: AAROM, Strengthening, 5 reps, Both Long Arc Quad: Strengthening, Both, 10 reps    General Comments        Pertinent Vitals/Pain Pain Assessment Pain Assessment: No/denies pain    Home Living                          Prior Function            PT Goals (current goals can now be found in the care plan section) Progress towards PT goals: Not progressing toward goals - comment (limited by orthostatic hypotension)    Frequency    Min 2X/week      PT Plan Discharge plan needs to be updated    Co-evaluation              AM-PAC PT "6 Clicks" Mobility   Outcome Measure  Help needed turning from your back to your side while in a flat bed without using bedrails?: A Little Help needed moving from lying on your back to sitting on the side of a flat bed without using bedrails?: A Little Help needed moving to and from a bed to a chair (including a wheelchair)?: A Little Help needed standing up from a chair using your arms (e.g., wheelchair or bedside chair)?: A Little Help needed to walk in hospital room?: A Lot Help needed climbing 3-5 steps with a railing? : Total 6 Click Score: 15    End of Session Equipment Utilized During Treatment: Gait belt;Oxygen Activity Tolerance: Other (comment) (Pt limited by orthostatic hypotension) Patient left: in bed;with call bell/phone within reach;with bed alarm set Nurse Communication: Mobility status PT Visit Diagnosis: Unsteadiness on feet (R26.81);Other abnormalities of gait and mobility (R26.89);Muscle weakness (generalized)  (M62.81)     Time: 9476-5465 PT Time Calculation (min) (ACUTE ONLY): 35 min  Charges:  $Therapeutic Exercise: 8-22 mins $Therapeutic Activity: 8-22 mins                     D. Scott Kourtnee Lahey PT, DPT 07/29/22, 5:42 PM

## 2022-07-29 NOTE — Progress Notes (Signed)
Rounding Note    Patient Name: Ernest [name] Date of Encounter: 07/29/2022  [place] Cardiologist: Kate Sable, MD   Subjective   Patient seen on AM rounds. Denies any chest pain or shortness of breath. Remains in sinus after DCCV completed on 07/28/22. Remains on 4L O2 via [place]. Looking forward to physical therapy today.  Inpatient Medications    Scheduled Meds:  amiodarone  200 mg Oral Daily   apixaban  5 mg Oral BID   dicyclomine  10 mg Oral TID AC   fludrocortisone  0.1 mg Oral Daily   midodrine  15 mg Oral TID with meals   potassium chloride  20 mEq Oral BID   sodium chloride flush  3 mL Intravenous Q12H   Continuous Infusions:  sodium chloride     PRN Meds: acetaminophen **OR** acetaminophen, ondansetron **OR** ondansetron (ZOFRAN) IV   Vital Signs    Vitals:   07/28/22 1757 07/28/22 1945 07/29/22 0030 07/29/22 0400  BP: 105/69 100/65 97/66 (!) 92/58  Pulse: 87 72 72 65  Resp: '18 18 20   '$ Temp: 98.5 F (36.9 C) 99 F (37.2 C) 98.1 F (36.7 C) 97.8 F (36.6 C)  TempSrc:    Oral  SpO2: 100% 100% 99% 100%  Weight:      Height:        Intake/Output Summary (Last 24 hours) at 07/29/2022 1033 Last data filed at 07/29/2022 0400 Gross per 24 hour  Intake 202.47 ml  Output 200 ml  Net 2.47 ml      07/28/2022    2:36 PM 07/27/2022    4:02 AM 07/25/2022    8:55 PM  Last 3 Weights  Weight (lbs) 311 lb 1.1 oz 313 lb 11.4 oz 305 lb  Weight (kg) 141.1 kg 142.3 kg 138.347 kg      Telemetry    Sinus with PAC's rate of 60's - Personally Reviewed  ECG    No new tracings - Personally Reviewed  Physical Exam   GEN: No acute distress.   Neck: Unable to assess JVD due to body habitus Cardiac: RRR, distant heart sounds, no murmurs, rubs, or gallops.  Respiratory: Clear to auscultation bilaterally. Respirations are unlabored at rest on 4L O2 via [place] GI: Soft, nontender, obese, non-distended  MS: 1+ edema BLE; No deformity. Neuro:  Nonfocal   Psych: Normal affect   Labs    High Sensitivity Troponin:   Recent Labs  Lab 07/25/22 2101  TROPONINIHS 4     Chemistry Recent Labs  Lab 07/25/22 2101 07/26/22 1100 07/27/22 0704 07/28/22 0601  NA 136 135 139 140  K 3.2* 2.7* 3.5 3.6  CL 93* 98 102 104  CO2 '31 30 31 29  '$ GLUCOSE 104* 106* 90 91  BUN '17 15 12 13  '$ CREATININE 1.08 1.09 0.97 0.97  CALCIUM 7.8* 7.2* 7.4* 7.7*  MG  --  1.7 2.2  --   PROT 5.8*  --   --   --   ALBUMIN 2.4*  --   --   --   AST 19  --   --   --   ALT 9  --   --   --   ALKPHOS 71  --   --   --   BILITOT 1.3*  --   --   --   GFRNONAA >60 >60 >60 >60  ANIONGAP '12 7 6 7    '$ Lipids No results for input(s): "CHOL", "TRIG", "HDL", "LABVLDL", "LDLCALC", "  CHOLHDL" in the last 168 hours.  Hematology Recent Labs  Lab 07/25/22 2101 07/26/22 0619 07/27/22 0704  WBC 11.7* 8.6 7.6  RBC 5.05 4.05* 4.07*  HGB 13.1 10.6* 10.7*  HCT 40.8 32.4* 33.6*  MCV 80.8 80.0 82.6  MCH 25.9* 26.2 26.3  MCHC 32.1 32.7 31.8  RDW 16.2* 16.3* 17.2*  PLT 350 252 242   Thyroid No results for input(s): "TSH", "FREET4" in the last 168 hours.  BNPNo results for input(s): "BNP", "PROBNP" in the last 168 hours.  DDimer No results for input(s): "DDIMER" in the last 168 hours.   Radiology    No results found.  Cardiac Studies   Echo 07/09/22  1. Challenging images.   2. Left ventricular ejection fraction, by estimation, is 60 to 65%. The  left ventricle has normal function. The left ventricle has no regional  wall motion abnormalities. Left ventricular diastolic parameters are  indeterminate.   3. Right ventricular systolic function is normal. The right ventricular  size is mildly enlarged.   4. The mitral valve is normal in structure. No evidence of mitral valve  regurgitation. No evidence of mitral stenosis.   5. The aortic valve was not well visualized. Aortic valve regurgitation  is not visualized. No aortic stenosis is present.   6. The inferior vena cava is  normal in size with greater than 50%  respiratory variability, suggesting right atrial pressure of 3 mmHg.   7. Right atrial size was mildly dilated.   8. Unable to exclude abnormal rhythm, atrial fibrillation.   Comparison(s): 01/10/21 EF 50-55%.   Patient Profile     77 y.o. male with a past medical history of OSA/OHS on home O2, morbid obesity, persistent atrial fibrillation, partial SBO, who is being seen and evaluated for hypotension and atrial fibrillation.   Assessment & Plan    Orthostatic hypotension -possibly worsened by volume depletion from diarrhea and atrial fibrillation -blood pressure 92/58 -continued on midodrine 15 mg tid, wean down dose as blood pressure allows -continued on fludrocortisone 0.1 mg daily -vital sings per unit protocol  Persistent atrial fibrillation -s/p successful DCCV on 07/28/22 -continued on amiodarone 200 mg daily -continued on apixaban 5 mg bid for a CHADS-VASc of at least  -continue with outpatient follow-up with EP for possible changes to medication regimen -likely poor candidate for ablation procedure due to morbid obesity  Untreated OSA/OHS -has not tolerated Bipap in the past -complicates the management of his atrial fibrillation  4. Hypokalemia -serum potassium 3.6 this morning -improving -continued on 38mq of potasium bid -daily bmp -monitor/trend/replete electrolytes as needed     For questions or updates, please contact [place]Please consult [email or web address] for contact info under        Signed, Kiyah Demartini, NP  07/29/2022, 10:33 AM

## 2022-07-29 NOTE — Care Management Important Message (Signed)
Important Message  Patient Details  Name: Ernest [name] MRN: [id number] Date of Birth: 1946/03/22   Medicare Important Message Given:  Yes     Dannette Barbara 07/29/2022, 12:46 PM

## 2022-07-29 NOTE — TOC Progression Note (Signed)
Transition of Care [facility]) - Progression Note    Patient Details  Name: Ernest [name] MRN: [id number] Date of Birth: 11-05-1945  [facility]) CM/SW [place], LCSW Phone Number: 07/29/2022, 12:10 PM  Clinical Narrative:  OT changed recommendation to SNF. Following to see if PT changes recommendations as well.   Expected Discharge Plan: [place] Barriers to Discharge: Continued Medical Work up  Expected Discharge Plan and Services   Discharge Planning Services: CM Consult   Living arrangements for the past 2 months: Single Family Home                                       Social Determinants of Health (SDOH) Interventions SDOH Screenings   Food Insecurity: No Food Insecurity (07/26/2022)  Housing: Low Risk  (07/26/2022)  Transportation Needs: No Transportation Needs (07/26/2022)  Utilities: Not At Risk (07/26/2022)  Alcohol Screen: Low Risk  (06/21/2021)  Depression (PHQ2-9): Low Risk  (06/21/2021)  Financial Resource Strain: Low Risk  (06/21/2021)  Physical Activity: Inactive (06/21/2021)  Social Connections: Unknown (06/21/2021)  Stress: No Stress Concern Present (06/21/2021)  Tobacco Use: Low Risk  (07/29/2022)    Readmission Risk Interventions     No data to display

## 2022-07-29 NOTE — Progress Notes (Addendum)
Patient orthostatic this afternoon also heart rate up to 150 with standing.  Possible POTS.  Due for his next dose of midodrine soon.  Will increase Florinef to 0.2 mg daily.  Potentially can consider pyridostigmine and or low-dose beta-blocker next.  Will order serum tryptase to rule out mastocytosis will order serum VIP to rule out VIP syndrome.  Trying to call the lab to figure out how to order [place] I AA to rule out carcinoid.  Dr. Loletha Grayer

## 2022-07-29 NOTE — Progress Notes (Signed)
       CROSS COVER NOTE  NAME: Ernest [name] MRN: [id number] DOB : 12-Apr-1946 ATTENDING PHYSICIAN: Loletha Grayer, MD    Date of Service   07/29/2022   HPI/Events of Note   Medication request received for patient report of indigestion  Interventions   Assessment/Plan:  Mylanta/Maalox      To reach the provider On-Call:   7AM- 7PM see care teams to locate the attending and reach out to them via [email or web address] Password: [email or web address] 7PM-7AM contact night-coverage If you still have difficulty reaching the appropriate provider, please page the [facility] (Director on Call) for [facility] on amion for assistance  This document was prepared using Systems analyst and may include unintentional dictation errors.  Neomia Glass DNP, MBA, FNP-BC, PMHNP-BC Nurse Practitioner [facility] Pager [phone]

## 2022-07-30 ENCOUNTER — Ambulatory Visit: Admission: RE | Admit: 2022-07-30 | Payer: Medicare Other | Source: Home / Self Care | Admitting: Cardiology

## 2022-07-30 DIAGNOSIS — I951 Orthostatic hypotension: Secondary | ICD-10-CM | POA: Diagnosis not present

## 2022-07-30 LAB — BASIC METABOLIC PANEL
Anion gap: 4 — ABNORMAL LOW (ref 5–15)
BUN: 13 mg/dL (ref 8–23)
CO2: 28 mmol/L (ref 22–32)
Calcium: 7.3 mg/dL — ABNORMAL LOW (ref 8.9–10.3)
Chloride: 107 mmol/L (ref 98–111)
Creatinine, Ser: 0.91 mg/dL (ref 0.61–1.24)
GFR, Estimated: >60 mL/min (ref >60)
Glucose, Bld: 88 mg/dL (ref 70–99)
Potassium: 3.6 mmol/L (ref 3.5–5.1)
Sodium: 139 mmol/L (ref 135–145)

## 2022-07-30 LAB — PHOSPHORUS: Phosphorus: 3.3 mg/dL (ref 2.5–4.6)

## 2022-07-30 LAB — CBC
HCT: 32.5 % — ABNORMAL LOW (ref 39.0–52.0)
Hemoglobin: 10.2 g/dL — ABNORMAL LOW (ref 13.0–17.0)
MCH: 26.2 pg (ref 26.0–34.0)
MCHC: 31.4 g/dL (ref 30.0–36.0)
MCV: 83.3 fL (ref 80.0–100.0)
Platelets: 213 10*3/uL (ref 150–400)
RBC: 3.9 MIL/uL — ABNORMAL LOW (ref 4.22–5.81)
RDW: 17.2 % — ABNORMAL HIGH (ref 11.5–15.5)
WBC: 7.1 10*3/uL (ref 4.0–10.5)
nRBC: 0 % (ref 0.0–0.2)

## 2022-07-30 LAB — GLUCOSE, CAPILLARY
Glucose-Capillary: 84 mg/dL (ref 70–99)
Glucose-Capillary: 100 mg/dL — ABNORMAL HIGH (ref 70–99)

## 2022-07-30 LAB — MAGNESIUM: Magnesium: 2.1 mg/dL (ref 1.7–2.4)

## 2022-07-30 LAB — VITAMIN B12: Vitamin B-12: 140 pg/mL — ABNORMAL LOW (ref 180–914)

## 2022-07-30 SURGERY — CARDIOVERSION
Anesthesia: General

## 2022-07-30 MED ORDER — PANTOPRAZOLE SODIUM 40 MG PO TBEC
40.0000 mg | DELAYED_RELEASE_TABLET | Freq: Every day | ORAL | Status: DC
  Administered 2022-07-30 – 2022-08-01 (×3): 40 mg via ORAL
  Filled 2022-07-30 (×3): qty 1

## 2022-07-30 MED ORDER — ALUM & MAG HYDROXIDE-SIMETH 200-200-20 MG/5ML PO SUSP
30.0000 mL | ORAL | Status: DC | PRN
  Administered 2022-07-30 (×2): 30 mL via ORAL
  Filled 2022-07-30 (×2): qty 30

## 2022-07-30 MED ORDER — LOPERAMIDE HCL 2 MG PO CAPS
2.0000 mg | ORAL_CAPSULE | ORAL | Status: DC | PRN
  Administered 2022-07-30: 2 mg via ORAL
  Filled 2022-07-30: qty 1

## 2022-07-30 MED ORDER — DICYCLOMINE HCL 10 MG PO CAPS
10.0000 mg | ORAL_CAPSULE | Freq: Three times a day (TID) | ORAL | Status: DC | PRN
  Administered 2022-07-30 – 2022-08-01 (×4): 10 mg via ORAL
  Filled 2022-07-30 (×4): qty 1

## 2022-07-30 NOTE — NC FL2 (Signed)
[place] LEVEL OF CARE FORM     IDENTIFICATION  Patient Name: Ernest [name] Birthdate: 1945-12-15 Sex: male Admission Date (Current Location): 07/25/2022  [facility] and [place] Number:  Engineering geologist and Address:  [facility], [address] [place], [place] [address]      Provider Number: [id number]  Attending Physician Name and Address:  Sidney Ace, MD  Relative Name and Phone Number:       Current Level of Care: Hospital Recommended Level of Care: [place] Prior Approval Number:    Date Approved/Denied:   PASRR Number: [id number]  Discharge Plan: SNF    Current Diagnoses: Patient Active Problem List   Diagnosis Date Noted   Recurrent syncope 07/26/2022   Diarrhea 07/26/2022   Chronic anticoagulation 07/26/2022   Chronic venous stasis 07/26/2022   Hypokalemia 07/26/2022   Orthostatic hypotension 07/26/2022   Hypomagnesemia 07/26/2022   Persistent atrial fibrillation ([place]) 07/14/2022   Atrial fibrillation with rapid ventricular response ([place]) 06/27/2022   Hypercoagulable state due to persistent atrial fibrillation ([place]) 06/27/2022   Chronic right-sided heart failure ([place]) 06/27/2022   Obesity hypoventilation syndrome ([place]) 06/27/2022   Essential hypertension 06/27/2022   Chronic respiratory failure with hypoxia ([place]) 05/22/2022   OSA (obstructive sleep apnea) 05/22/2022   History of colonic polyps    Polyp of transverse colon    Adenomatous polyp of cecum    Partial small bowel obstruction ([place]) 04/01/2021   Abdominal bloating 11/30/2020   Abdominal pain, epigastric    GERD (gastroesophageal reflux disease)    Swollen lymph nodes 07/25/2020   SVT (supraventricular tachycardia) 06/25/2020   Obesity, Class III, BMI 40-49.9 (morbid obesity) ([place]) 06/25/2020   History of umbilical hernia repair 66/29/4765    Orientation RESPIRATION BLADDER Height & Weight     Self, Time, Situation,  Place  O2 (Nasal Cannula 3 L) Incontinent, External catheter Weight: (!) 311 lb 1.1 oz (141.1 kg) Height:  '6\' 1"'$  (185.4 cm)  BEHAVIORAL SYMPTOMS/MOOD NEUROLOGICAL BOWEL NUTRITION STATUS   (None)  (None) Continent Diet  AMBULATORY STATUS COMMUNICATION OF NEEDS Skin   Extensive Assist Verbally Other (Comment) (Erythema/redness. Skin tear on left posterior elbow: Foam prn.)                       Personal Care Assistance Level of Assistance  Bathing, Dressing, Feeding Bathing Assistance: Maximum assistance Feeding assistance: Limited assistance Dressing Assistance: Maximum assistance     Functional Limitations Info  Sight, Hearing, Speech Sight Info: Adequate Hearing Info: Adequate Speech Info: Adequate    SPECIAL CARE FACTORS FREQUENCY  PT (By licensed PT), OT (By licensed OT)     PT Frequency: 5 x week OT Frequency: 5 x week            Contractures Contractures Info: Not present    Additional Factors Info  Code Status, Allergies Code Status Info: DNR Allergies Info: NKDA           Current Medications (07/30/2022):  This is the current hospital active medication list Current Facility-Administered Medications  Medication Dose Route Frequency Provider Last Rate Last Admin   acetaminophen (TYLENOL) tablet 650 mg  650 mg Oral Q6H PRN Wellington Hampshire, MD   650 mg at 07/27/22 2047   Or   acetaminophen (TYLENOL) suppository 650 mg  650 mg Rectal Q6H PRN Wellington Hampshire, MD       alum & mag hydroxide-simeth (MAALOX/MYLANTA) 200-200-20 MG/5ML suspension  30 mL  30 mL Oral Q4H PRN Ralene Muskrat B, MD   30 mL at 07/30/22 1112   amiodarone (PACERONE) tablet 200 mg  200 mg Oral Daily Kathlyn Sacramento A, MD   200 mg at 07/30/22 5638   apixaban (ELIQUIS) tablet 5 mg  5 mg Oral BID Kathlyn Sacramento A, MD   5 mg at 07/30/22 9373   dicyclomine (BENTYL) capsule 10 mg  10 mg Oral TID AC Wellington Hampshire, MD   10 mg at 07/29/22 1238   fludrocortisone (FLORINEF) tablet 0.2 mg   0.2 mg Oral Daily Loletha Grayer, MD   0.2 mg at 07/30/22 0824   midodrine (PROAMATINE) tablet 15 mg  15 mg Oral TID with meals Loletha Grayer, MD   15 mg at 07/30/22 4287   multivitamin (PROSIGHT) tablet 1 tablet  1 tablet Oral Daily Benita Gutter, RPH   1 tablet at 07/30/22 0824   ondansetron (ZOFRAN) tablet 4 mg  4 mg Oral Q6H PRN Wellington Hampshire, MD       Or   ondansetron (ZOFRAN) injection 4 mg  4 mg Intravenous Q6H PRN Wellington Hampshire, MD       pantoprazole (PROTONIX) EC tablet 40 mg  40 mg Oral Daily Sreenath, Sudheer B, MD   40 mg at 07/30/22 1112   potassium chloride SA (KLOR-CON M) CR tablet 20 mEq  20 mEq Oral BID Kathlyn Sacramento A, MD   20 mEq at 07/30/22 6811   sodium chloride 0.9 % bolus 500 mL  500 mL Intravenous Once Kathlyn Sacramento A, MD       sodium chloride flush (NS) 0.9 % injection 3 mL  3 mL Intravenous Q12H Wellington Hampshire, MD   3 mL at 07/30/22 5726     Discharge Medications: Please see discharge summary for a list of discharge medications.  Relevant Imaging Results:  Relevant Lab Results:   Additional Information SS#: [id number]  Candie Chroman, LCSW

## 2022-07-30 NOTE — Evaluation (Signed)
Physical Therapy Evaluation Patient Details Name: JAMARRION BUDAI MRN: [id number] DOB: Jun 17, 1946 Today's Date: 07/30/2022  History of Present Illness  76yoM [name], II, comes to [facility] 2/2 after several 4-5 near syncopal episodes preceding day and 5 days diarrhea. Pt started on midodrine 2 weeks prior due to similar problem, pt notes success with meds. Pt followed by cardiology, consulting this admission, questions regarding symptoms attributed to amiodarone, pt previously scheduled for cardioversion 07/30/22. is now s/p cardioversion as of 07/28/22   Clinical Impression  Patient was agreeable to PT session. Abdominal binder and compression hose were used during mobility efforts today and mobility session timed around midodrine medication.  Orthostatic VS during therapy session:  BP- Lying Pulse- Lying BP- Sitting Pulse- Sitting BP- Standing at 0 minutes Pulse- Standing at 0 minutes BP- Standing at 3 minutes Pulse- Standing at 3 minutes  07/30/22 0930 104/67 75 (!) 88/64 88 (!) 83/64 127 (!) 89/62 68            Standing activity tolerance improved today compared to yesterday. Heart rate was as high as 147 while standing, between 1-2 minutes. The patient reports dizziness after standing for around 2.5 minutes with total standing tolerance of around 3 minutes today. The patient was able to get to the chair but further walking not attempted due to dizziness. The patient is hopeful to return home, but he continues to require physical assistance for standing and unable to walk a household distance at this time. The patient feels he needs to be able to walk to the door in his room in order to safely return home. PT will continue to follow to maximize independence and decrease caregiver burden.         Recommendations for follow up therapy are one component of a multi-disciplinary discharge planning process, led by the attending physician.  Recommendations may be updated based on patient status,  additional functional criteria and insurance authorization.  Follow Up Recommendations Skilled nursing-short term rehab (<3 hours/day) Can patient physically be transported by private vehicle: No    Assistance Recommended at Discharge Frequent or constant Supervision/Assistance  Patient can return home with the following  A lot of help with bathing/dressing/bathroom;Assist for transportation;Help with stairs or ramp for entrance;Assistance with cooking/housework;Two people to help with walking and/or transfers    Equipment Recommendations None recommended by PT  Recommendations for Other Services       Functional Status Assessment       Precautions / Restrictions Precautions Precautions: Fall Precaution Comments: monitor BP and HR; abdoiminal binder and TED hose Restrictions Weight Bearing Restrictions: No      Mobility  Bed Mobility Overal bed mobility: Needs Assistance Bed Mobility: Supine to Sit       Sit to supine: Min assist   General bed mobility comments: abdominal binder donned prior to sitting upright in [place]emps to help with orthostatic hypotension. no abdominal discomfort was reported with binder in place which was doffed after getting to chair for comfort. occasional assistance for LE support    Transfers Overall transfer level: Needs assistance Equipment used: Rolling walker (2 wheels) Transfers: Sit to/from Stand, Bed to chair/wheelchair/BSC Sit to Stand: Min assist, +2 physical assistance   Step pivot transfers: Min assist       General transfer comment: with bed at the lowest height, patient required +2 person assistance for standing. cues for safety and technique. no dizziness reported with sitting upright. patient is able to take several steps from bed to chair  with occasional steading assistance. further standing tolerance limited by dizziness    Ambulation/Gait               General Gait Details: not attempted as patient reports dizziness  after standing for 2.5 minutes. total standing time around 3 minutes. patient reports he feels he needs to be able to walk the distance from the recliner chair to the door in order to go home safely, which he was unable to do today  due to dizziness  Stairs            Wheelchair Mobility    Modified Rankin (Stroke Patients Only)       Balance Overall balance assessment: Needs assistance Sitting-balance support: Feet supported Sitting balance-Leahy Scale: Good     Standing balance support: Reliant on assistive device for balance, During functional activity, Single extremity supported Standing balance-Leahy Scale: Fair Standing balance comment: no external support required from therapist for standing with unilateral UE support during static standing                             Pertinent Vitals/Pain Pain Assessment Pain Assessment: No/denies pain    Home Living                          Prior Function                       Hand Dominance        Extremity/Trunk Assessment                Communication      Cognition Arousal/Alertness: Awake/alert Behavior During Therapy: WFL for tasks assessed/performed Overall Cognitive Status: Within Functional Limits for tasks assessed                                 General Comments: cooperative and eager to mobilize        General Comments General comments (skin integrity, edema, etc.): the patient had no adverse symptoms with mobility until standing at the 2.5 minute mark. he then reports onset of dizziness and requesting to sit soon. total standing tolerance was around 3 minutes    Exercises     Assessment/Plan    PT Assessment    PT Problem List         PT Treatment Interventions      PT Goals (Current goals can be found in the Care Plan section)  Acute Rehab PT Goals Patient Stated Goal: to return home, walk PT Goal Formulation: With patient Time For Goal  Achievement: 08/10/22 Potential to Achieve Goals: Fair    Frequency Min 2X/week     Co-evaluation PT/OT/SLP Co-Evaluation/Treatment: Yes Reason for Co-Treatment: Complexity of the patient's impairments (multi-system involvement);To address functional/ADL transfers;For patient/therapist safety PT goals addressed during session: Mobility/safety with mobility         AM-PAC PT "6 Clicks" Mobility  Outcome Measure Help needed turning from your back to your side while in a flat bed without using bedrails?: A Little Help needed moving from lying on your back to sitting on the side of a flat bed without using bedrails?: A Little Help needed moving to and from a bed to a chair (including a wheelchair)?: A Little Help needed standing up from a chair using your arms (e.g., wheelchair or bedside chair)?: A  Little Help needed to walk in hospital room?: A Lot Help needed climbing 3-5 steps with a railing? : Total 6 Click Score: 15    End of Session         PT Visit Diagnosis: Unsteadiness on feet (R26.81);Other abnormalities of gait and mobility (R26.89);Muscle weakness (generalized) (M62.81)    Time: 3552-1747 PT Time Calculation (min) (ACUTE ONLY): 31 min   Charges:     PT Treatments $Therapeutic Activity: 8-22 mins        Minna Merritts, PT, MPT   [name] 07/30/2022, 10:07 AM

## 2022-07-30 NOTE — Progress Notes (Signed)
Rounding Note    Patient Name: Ernest [name] Date of Encounter: 07/30/2022  [facility] Cardiologist: Kate Sable, MD   Subjective   Patient reports he worked with PT/OT this AM and did better with an abdominal binder.   Inpatient Medications    Scheduled Meds:  amiodarone  200 mg Oral Daily   apixaban  5 mg Oral BID   fludrocortisone  0.2 mg Oral Daily   midodrine  15 mg Oral TID with meals   multivitamin  1 tablet Oral Daily   pantoprazole  40 mg Oral Daily   potassium chloride  20 mEq Oral BID   sodium chloride flush  3 mL Intravenous Q12H   Continuous Infusions:  sodium chloride     PRN Meds: acetaminophen **OR** acetaminophen, alum & mag hydroxide-simeth, dicyclomine, ondansetron **OR** ondansetron (ZOFRAN) IV   Vital Signs    Vitals:   07/29/22 2000 07/29/22 2348 07/30/22 0340 07/30/22 0908  BP: (!) 89/58 (!) '93/57 96/63 94/65 '$  Pulse: (!) 55 60 63 75  Resp: '16 18 18 18  '$ Temp: 98.1 F (36.7 C) 98.2 F (36.8 C) 97.7 F (36.5 C) 97.7 F (36.5 C)  TempSrc:    Oral  SpO2: 100% 100% 100% 98%  Weight:      Height:        Intake/Output Summary (Last 24 hours) at 07/30/2022 1154 Last data filed at 07/30/2022 0900 Gross per 24 hour  Intake 1894.7 ml  Output 200 ml  Net 1694.7 ml      07/28/2022    2:36 PM 07/27/2022    4:02 AM 07/25/2022    8:55 PM  Last 3 Weights  Weight (lbs) 311 lb 1.1 oz 313 lb 11.4 oz 305 lb  Weight (kg) 141.1 kg 142.3 kg 138.347 kg      Telemetry    NSR PACs, HR 60s - Personally Reviewed  ECG    No new - Personally Reviewed  Physical Exam   GEN: No acute distress.   Neck: No JVD Cardiac: RRR, no murmurs, rubs, or gallops.  Respiratory: Clear to auscultation bilaterally. GI: Soft, nontender, non-distended  MS: No edema; No deformity. Neuro:  Nonfocal  Psych: Normal affect   Labs    High Sensitivity Troponin:   Recent Labs  Lab 07/25/22 2101  TROPONINIHS 4     Chemistry Recent Labs  Lab  07/25/22 2101 07/26/22 1100 07/27/22 0704 07/28/22 0601 07/30/22 0426  NA 136 135 139 140 139  K 3.2* 2.7* 3.5 3.6 3.6  CL 93* 98 102 104 107  CO2 '31 30 31 29 28  '$ GLUCOSE 104* 106* 90 91 88  BUN '17 15 12 13 13  '$ CREATININE 1.08 1.09 0.97 0.97 0.91  CALCIUM 7.8* 7.2* 7.4* 7.7* 7.3*  MG  --  1.7 2.2  --  2.1  PROT 5.8*  --   --   --   --   ALBUMIN 2.4*  --   --   --   --   AST 19  --   --   --   --   ALT 9  --   --   --   --   ALKPHOS 71  --   --   --   --   BILITOT 1.3*  --   --   --   --   GFRNONAA >60 >60 >60 >60 >60  ANIONGAP '12 7 6 7 '$ 4*    Lipids No results for input(s): "CHOL", "  TRIG", "HDL", "LABVLDL", "LDLCALC", "CHOLHDL" in the last 168 hours.  Hematology Recent Labs  Lab 07/26/22 0619 07/27/22 0704 07/30/22 0426  WBC 8.6 7.6 7.1  RBC 4.05* 4.07* 3.90*  HGB 10.6* 10.7* 10.2*  HCT 32.4* 33.6* 32.5*  MCV 80.0 82.6 83.3  MCH 26.2 26.3 26.2  MCHC 32.7 31.8 31.4  RDW 16.3* 17.2* 17.2*  PLT 252 242 213   Thyroid No results for input(s): "TSH", "FREET4" in the last 168 hours.  BNPNo results for input(s): "BNP", "PROBNP" in the last 168 hours.  DDimer No results for input(s): "DDIMER" in the last 168 hours.   Radiology    No results found.  Cardiac Studies   Echo 07/09/22  1. Challenging images.   2. Left ventricular ejection fraction, by estimation, is 60 to 65%. The  left ventricle has normal function. The left ventricle has no regional  wall motion abnormalities. Left ventricular diastolic parameters are  indeterminate.   3. Right ventricular systolic function is normal. The right ventricular  size is mildly enlarged.   4. The mitral valve is normal in structure. No evidence of mitral valve  regurgitation. No evidence of mitral stenosis.   5. The aortic valve was not well visualized. Aortic valve regurgitation  is not visualized. No aortic stenosis is present.   6. The inferior vena cava is normal in size with greater than 50%  respiratory variability,  suggesting right atrial pressure of 3 mmHg.   7. Right atrial size was mildly dilated.   8. Unable to exclude abnormal rhythm, atrial fibrillation.   Comparison(s): 01/10/21 EF 50-55%.  Patient Profile     77 y.o. male with a past medical history of OSA/OHS on home O2, morbid obesity, persistent atrial fibrillation, partial SBO, who is being seen and evaluated for hypotension and atrial fibrillation.   Assessment & Plan    Orthostatic hypotension - exacerbated by volume depletion and afib - BP 94/65 - continue midodrine '15mg'$  TID - fludrocortisone increased to 0.'2mg'$  daily - Orthostatics show moderate drop in BP with elevation in HR up to 127. Suspected POTS - continue abdominal binder. Patient plans on using thigh high stockings  Persistent Afib - s/p successful DCCV on 07/28/22 - continue amiodarone '200mg'$  daily - continue Eliquis '5mg'$  BID  - plan to follow-up with EP as outpatient  Untreated OSA/OHS - has not tolerated Bipap in the past, may need to revisit as OP  Hypokalemia -improved to K 3.6  For questions or updates, please contact [place] Please consult [email or web address] for contact info under        Signed, Aishwarya Shiplett Ninfa Meeker, PA-C  07/30/2022, 11:54 AM

## 2022-07-30 NOTE — Plan of Care (Signed)
  Problem: Education: Goal: Knowledge of condition and prescribed therapy will improve Outcome: Progressing   Problem: Cardiac: Goal: Will achieve and/or maintain adequate cardiac output Outcome: Progressing   Problem: Physical Regulation: Goal: Complications related to the disease process, condition or treatment will be avoided or minimized Outcome: Progressing   Problem: Education: Goal: Knowledge of General Education information will improve Description: Including pain rating scale, medication(s)/side effects and non-pharmacologic comfort measures Outcome: Progressing   Problem: Health Behavior/Discharge Planning: Goal: Ability to manage health-related needs will improve Outcome: Progressing   Problem: Clinical Measurements: Goal: Ability to maintain clinical measurements within normal limits will improve Outcome: Progressing Goal: Will remain free from infection Outcome: Progressing Goal: Diagnostic test results will improve Outcome: Progressing Goal: Respiratory complications will improve Outcome: Progressing Goal: Cardiovascular complication will be avoided Outcome: Progressing   Problem: Activity: Goal: Risk for activity intolerance will decrease Outcome: Progressing   Problem: Nutrition: Goal: Adequate nutrition will be maintained Outcome: Progressing   Problem: Coping: Goal: Level of anxiety will decrease Outcome: Progressing

## 2022-07-30 NOTE — TOC Progression Note (Signed)
Transition of Care [facility]) - Progression Note    Patient Details  Name: Ernest [name] MRN: [id number] Date of Birth: 28-Apr-1946  Transition of [facility]) CM/SW [place], LCSW Phone Number: 07/30/2022, 11:22 AM  Clinical Narrative:  Met with patient and discussed SNF recommendation. Gave CMS scores for facilities within 25 miles of his zip code. First preference is [facility]. Admissions coordinator is aware. Second preference is [facility].   Expected Discharge Plan: [place] Barriers to Discharge: Continued Medical Work up  Expected Discharge Plan and Services   Discharge Planning Services: CM Consult   Living arrangements for the past 2 months: Single Family Home                                       Social Determinants of Health (SDOH) Interventions SDOH Screenings   Food Insecurity: No Food Insecurity (07/26/2022)  Housing: Low Risk  (07/26/2022)  Transportation Needs: No Transportation Needs (07/26/2022)  Utilities: Not At Risk (07/26/2022)  Alcohol Screen: Low Risk  (06/21/2021)  Depression (PHQ2-9): Low Risk  (06/21/2021)  Financial Resource Strain: Low Risk  (06/21/2021)  Physical Activity: Inactive (06/21/2021)  Social Connections: Unknown (06/21/2021)  Stress: No Stress Concern Present (06/21/2021)  Tobacco Use: Low Risk  (07/29/2022)    Readmission Risk Interventions     No data to display

## 2022-07-30 NOTE — Plan of Care (Signed)
  Problem: Education: Goal: Knowledge of condition and prescribed therapy will improve Outcome: Progressing   Problem: Cardiac: Goal: Will achieve and/or maintain adequate cardiac output Outcome: Progressing   Problem: Physical Regulation: Goal: Complications related to the disease process, condition or treatment will be avoided or minimized Outcome: Progressing   Problem: Education: Goal: Knowledge of General Education information will improve Description: Including pain rating scale, medication(s)/side effects and non-pharmacologic comfort measures Outcome: Progressing   Problem: Clinical Measurements: Goal: Ability to maintain clinical measurements within normal limits will improve Outcome: Progressing Goal: Will remain free from infection Outcome: Progressing Goal: Diagnostic test results will improve Outcome: Progressing Goal: Respiratory complications will improve Outcome: Progressing Goal: Cardiovascular complication will be avoided Outcome: Progressing

## 2022-07-30 NOTE — Progress Notes (Signed)
PROGRESS NOTE    Ernest [name]  ELF:[id number] DOB: August 08, 1945 DOA: 07/25/2022 PCP: [name], MD    Brief Narrative:  77 y.o. male with medical history significant for Class III obesity, chronic venous insufficiency of lower extremities on Lasix 40 mg 3 times a week, GERD, obesity hypoventilation syndrome and chronic respiratory failure on home O2 at 4L, HTN, recent diagnosis of A-fib, on amiodarone and Eliquis with plans for DC cardioversion later this month, chronic hypotension on midodrine started recently, who presents to the ED after having several presyncopal episodes.  Patient has been having intermittent diarrhea for the past month since he was started on the medication for his atrial fibrillation.  He has been using his midodrine as prescribed.  He denies chest pain, shortness of breath or palpitations.  He denies cough, fever or chills.  Denies vomiting or abdominal pain. ED course and data review: BP 87/61, with pulse in the 80s.  BP was briefly fluid responsive to 118/101 but then returned to SBP in the high 80s.  Labs significant for WBC 11,700.  Potassium 3.2.  Lipase and LFTs unremarkable.  Urinalysis unremarkable.EKG, personally reviewed and interpreted showed A-fib at 90 with nonspecific ST-T wave changes. Patient was treated with a 1.5 L NS bolus and started on IV potassium repletion and hospitalist consulted for admission.   2/3.  Repeat electrolytes show potassium of 2.7 magnesium 1.7.  Replace potassium IV and magnesium IV.  Patient still hypotensive and orthostatic and increased midodrine to 15 mg 3 times daily. 2/4.  Patient still orthostatic.  Continue gentle fluids and increase dose of midodrine.  Check an a.m. cortisol tomorrow morning.  Will consider starting Florinef if cortisol level okay.  Cardiology to try to set up TEE cardioversion. 2/5.  Cardioverted to normal sinus rhythm.  A.m. cortisol normal range.  Add Florinef to midodrine for orthostatic  hypotension. 2/6.  Patient still in normal sinus rhythm after cardioversion yesterday.  Still awaiting orthostatic vitals today.  Occupational therapy recommending rehab.  2/7: Abdominal binder has been put in place.  Symptoms with orthostasis is improved this morning however patient remained with postural tachycardia with ventricular rate up to 147   Assessment & Plan:   Principal Problem:   Orthostatic hypotension Active Problems:   Hypomagnesemia   Diarrhea   Hypokalemia   Recurrent syncope   Obesity, Class III, BMI 40-49.9 (morbid obesity) (HCC)   Chronic respiratory failure with hypoxia (HCC)   Obesity hypoventilation syndrome (HCC)   Persistent atrial fibrillation (HCC)   Chronic anticoagulation   Chronic venous stasis  * Orthostatic hypotension Patient remains symptomatic though seems to be improving.  Midodrine was increased to 50 mg 3 times daily, Florinef increased to 20 mg daily.  Workup for VIPoma and serotonin syndrome currently in progress.  Abdominal binder now in place.  Presentation appears consistent with POTS at this time Plan: Continue midodrine 15 mg 3 times daily.   Continue Florinef 0.2 mg daily Follow-up pending esoteric lab work Continue PT as tolerated with abdominal binder in place Continue compression stocking therapy  Hypomagnesemia Monitor and replace as necessary   Hypokalemia Monitor and replete as needed   Diarrhea Stool studies negative.  Could be secondary to amiodarone.  Trial of Bentyl to see if this helps.  Bentyl discontinued 2/6   Recurrent syncope Secondary to orthostatic hypotension   Chronic venous stasis Hold Lasix for now   Persistent atrial fibrillation (HCC) Cardioverted 2/5 to normal sinus rhythm.  Cardiology decreased amiodarone  dose down to 200 mg daily.  Continue anticoagulation with Eliquis.  Cardiology consider EP evaluation   Chronic respiratory failure with hypoxia ([place]) Obesity hypoventilation syndrome Patient  is intolerant of BiPAP but uses home O2 Continue home oxygen 4 L   Obesity, Class III, BMI 40-49.9 (morbid obesity) (HCC) BMI 41.04   DVT prophylaxis: Apixaban Code Status: Full Family Communication: Disposition Plan: Status is: Inpatient Remains inpatient appropriate because: Symptomatic orthostatic hypotension   Level of care: Progressive  Consultants:  Cardiology  Procedures:  None  Antimicrobials: None   Subjective: Seen and examined.  Resting in bed.  Working with physical therapy.  Abdominal binder in place.  No pain complaints.  Objective: Vitals:   07/29/22 2000 07/29/22 2348 07/30/22 0340 07/30/22 0908  BP: (!) 89/58 (!) '93/57 96/63 94/65 '$  Pulse: (!) 55 60 63 75  Resp: '16 18 18 18  '$ Temp: 98.1 F (36.7 C) 98.2 F (36.8 C) 97.7 F (36.5 C) 97.7 F (36.5 C)  TempSrc:    Oral  SpO2: 100% 100% 100% 98%  Weight:      Height:        Intake/Output Summary (Last 24 hours) at 07/30/2022 1106 Last data filed at 07/30/2022 0900 Gross per 24 hour  Intake 1894.7 ml  Output 200 ml  Net 1694.7 ml   Filed Weights   07/25/22 2055 07/27/22 0402 07/28/22 1436  Weight: (!) 138.3 kg (!) 142.3 kg (!) 141.1 kg    Examination:  General exam: Appears calm and comfortable  Respiratory system: Clear to auscultation. Respiratory effort normal. Cardiovascular system: S1-S2, RRR, no murmurs, trace pedal edema Gastrointestinal system: Obese, NT/ND, normal bowel sounds Central nervous system: Alert and oriented. No focal neurological deficits. Extremities: Symmetric 5 x 5 power. Skin: No rashes, lesions or ulcers Psychiatry: Judgement and insight appear normal. Mood & affect appropriate.     Data Reviewed: I have personally reviewed following labs and imaging studies  CBC: Recent Labs  Lab 07/25/22 2101 07/26/22 0619 07/27/22 0704 07/30/22 0426  WBC 11.7* 8.6 7.6 7.1  HGB 13.1 10.6* 10.7* 10.2*  HCT 40.8 32.4* 33.6* 32.5*  MCV 80.8 80.0 82.6 83.3  PLT 350 252  242 696   Basic Metabolic Panel: Recent Labs  Lab 07/25/22 2101 07/26/22 1100 07/27/22 0704 07/28/22 0601 07/30/22 0426  NA 136 135 139 140 139  K 3.2* 2.7* 3.5 3.6 3.6  CL 93* 98 102 104 107  CO2 '31 30 31 29 28  '$ GLUCOSE 104* 106* 90 91 88  BUN '17 15 12 13 13  '$ CREATININE 1.08 1.09 0.97 0.97 0.91  CALCIUM 7.8* 7.2* 7.4* 7.7* 7.3*  MG  --  1.7 2.2  --  2.1  PHOS  --   --  3.6  --  3.3   GFR: Estimated Creatinine Clearance: 102 mL/min (by C-G formula based on SCr of 0.91 mg/dL). Liver Function Tests: Recent Labs  Lab 07/25/22 2101  AST 19  ALT 9  ALKPHOS 71  BILITOT 1.3*  PROT 5.8*  ALBUMIN 2.4*   Recent Labs  Lab 07/25/22 2101  LIPASE 30   No results for input(s): "AMMONIA" in the last 168 hours. Coagulation Profile: Recent Labs  Lab 07/28/22 1233  INR 1.4*   Cardiac Enzymes: No results for input(s): "CKTOTAL", "CKMB", "CKMBINDEX", "TROPONINI" in the last 168 hours. BNP (last 3 results) No results for input(s): "PROBNP" in the last 8760 hours. HbA1C: No results for input(s): "HGBA1C" in the last 72 hours. CBG: Recent Labs  Lab 07/26/22 0625 07/27/22 0611 07/28/22 0521 07/29/22 0501 07/30/22 0538  GLUCAP 71 80 91 81 84   Lipid Profile: No results for input(s): "CHOL", "HDL", "LDLCALC", "TRIG", "CHOLHDL", "LDLDIRECT" in the last 72 hours. Thyroid Function Tests: No results for input(s): "TSH", "T4TOTAL", "FREET4", "T3FREE", "THYROIDAB" in the last 72 hours. Anemia Panel: No results for input(s): "VITAMINB12", "FOLATE", "FERRITIN", "TIBC", "IRON", "RETICCTPCT" in the last 72 hours. Sepsis Labs: No results for input(s): "PROCALCITON", "LATICACIDVEN" in the last 168 hours.  Recent Results (from the past 240 hour(s))  Gastrointestinal Panel by PCR , Stool     Status: None   Collection Time: 07/26/22  1:39 AM   Specimen: STOOL  Result Value Ref Range Status   Campylobacter species NOT DETECTED NOT DETECTED Final   Plesimonas shigelloides NOT  DETECTED NOT DETECTED Final   Salmonella species NOT DETECTED NOT DETECTED Final   Yersinia enterocolitica NOT DETECTED NOT DETECTED Final   Vibrio species NOT DETECTED NOT DETECTED Final   Vibrio cholerae NOT DETECTED NOT DETECTED Final   Enteroaggregative E coli (EAEC) NOT DETECTED NOT DETECTED Final   Enteropathogenic E coli (EPEC) NOT DETECTED NOT DETECTED Final   Enterotoxigenic E coli (ETEC) NOT DETECTED NOT DETECTED Final   Shiga like toxin producing E coli (STEC) NOT DETECTED NOT DETECTED Final   Shigella/Enteroinvasive E coli (EIEC) NOT DETECTED NOT DETECTED Final   Cryptosporidium NOT DETECTED NOT DETECTED Final   Cyclospora cayetanensis NOT DETECTED NOT DETECTED Final   Entamoeba histolytica NOT DETECTED NOT DETECTED Final   Giardia lamblia NOT DETECTED NOT DETECTED Final   Adenovirus F40/41 NOT DETECTED NOT DETECTED Final   Astrovirus NOT DETECTED NOT DETECTED Final   Norovirus GI/GII NOT DETECTED NOT DETECTED Final   Rotavirus A NOT DETECTED NOT DETECTED Final   Sapovirus (I, [name], IV, and V) NOT DETECTED NOT DETECTED Final    Comment: Performed at [facility], [address] [place], [place] [address]  C Difficile Quick Screen w PCR reflex     Status: None   Collection Time: 07/26/22  7:13 AM   Specimen: STOOL  Result Value Ref Range Status   C Diff antigen NEGATIVE NEGATIVE Final   C Diff toxin NEGATIVE NEGATIVE Final   C Diff interpretation No C. difficile detected.  Final    Comment: Performed at [facility], [address] [place], [place] [address]         Radiology Studies: No results found.      Scheduled Meds:  amiodarone  200 mg Oral Daily   apixaban  5 mg Oral BID   dicyclomine  10 mg Oral TID AC   fludrocortisone  0.2 mg Oral Daily   midodrine  15 mg Oral TID with meals   multivitamin  1 tablet Oral Daily   pantoprazole  40 mg Oral Daily   potassium chloride  20 mEq Oral BID   sodium chloride flush  3 mL Intravenous Q12H    Continuous Infusions:  sodium chloride 125 mL/hr at 07/30/22 0234   sodium chloride       LOS: 4 days     Sidney Ace, MD [facility]   If 7PM-7AM, please contact night-coverage  07/30/2022, 11:06 AM

## 2022-07-30 NOTE — Progress Notes (Addendum)
Occupational Therapy Treatment Patient Details Name: Ernest [name] MRN: [id number] DOB: 11-Apr-1946 Today's Date: 07/30/2022   History of present illness 76yoM Ernest [name], II, comes [facility]2 after several 4-5 near syncopal episodes preceding day and 5 days diarrhea. Pt started on midodrine 2 weeks prior due to similar problem, pt notes success with meds. Pt followed by cardiology, consulting this admission, questions regarding symptoms attributed to amiodarone, pt previously scheduled for cardioversion 07/30/22. is now s/p cardioversion as of 07/28/22   OT comments  Chart reviewed, pt greeted in bed agreeable to OT tx session. Co tx completed with PT on this date. Recommended by MD to trial abdominal binder +ted hose. Pt in agreement and abdominal binder donned in bed with pt reporting no discomfort/difficulties breathing. Tx session targeted improving functional activity tolerance to facilitate return to PLOF. Pt reports he amb approx 75fet at home with his rollator. Pt with improvements in tolerance for functional mobility on this date with pt performing STS with MIN A+2 with RW, short amb transfer to bedside chair with CGA-RW+2. Pt able to tolerate upright standing for aprox 2.5 minutes before reporting dizziness. Discussed with pt re: discharge recommendation and he reports he would like to go home, however open to STR if it is necessary. At his time discharge recommendation remains appropriate however OT will continue to follow acutely to continue to improve functional status.   BP in supine with abd binder donned: 104/67 (78) HR 75 Seated on edge of bed: 88/62 (72) HR 81 Standing immediately: 83/63 (71) HR 127  Standing after approx 2.5 minutes 89/62 (72) HR 68    Recommendations for follow up therapy are one component of a multi-disciplinary discharge planning process, led by the attending physician.  Recommendations may be updated based on patient status, additional functional criteria and  insurance authorization.    Follow Up Recommendations  Skilled nursing-short term rehab (<3 hours/day)     Assistance Recommended at Discharge Intermittent Supervision/Assistance  Patient can return home with the following  A lot of help with bathing/dressing/bathroom;Assistance with cooking/housework;Help with stairs or ramp for entrance;Assist for transportation;A little help with walking and/or transfers   Equipment Recommendations  BSC/3in1    Recommendations for Other Services      Precautions / Restrictions Precautions Precaution Comments: abdominal binder (two together) and ted hose; Restrictions Weight Bearing Restrictions: No Other Position/Activity Restrictions: Orthostatic hypotension       Mobility Bed Mobility Overal bed mobility: Needs Assistance Bed Mobility: Supine to Sit     Supine to sit: HOB elevated, Min assist          Transfers Overall transfer level: Needs assistance Equipment used: Rolling walker (2 wheels) Transfers: Sit to/from Stand, Bed to chair/wheelchair/BSC Sit to Stand: Min assist, +2 physical assistance     Step pivot transfers: Min assist           Balance Overall balance assessment: Needs assistance Sitting-balance support: Feet supported Sitting balance-Leahy Scale: Good     Standing balance support: Reliant on assistive device for balance, During functional activity, Single extremity supported Standing balance-Leahy Scale: Fair                             ADL either performed or assessed with clinical judgement   ADL Overall ADL's : Needs assistance/impaired Eating/Feeding: Set up;Sitting  Toilet Transfer: Surveyor, minerals Details (indicate cue type and reason): step pivot to bedside chair, simulated                Extremity/Trunk Assessment              Vision       Perception     Praxis      Cognition Arousal/Alertness:  Awake/alert Behavior During Therapy: WFL for tasks assessed/performed Overall Cognitive Status: Within Functional Limits for tasks assessed                                 General Comments: good awareness of current symptoms with mobility        Exercises Other Exercises Other Exercises: edu re: use of abd binder for continued BP support, discharge recommendations    Shoulder Instructions       General Comments please refer to orthostatic vitals- no symptoms until 2.5 minutes, then pt reporting dizziness.    Pertinent Vitals/ Pain       Pain Assessment Pain Assessment: No/denies pain  Home Living                                          Prior Functioning/Environment              Frequency  Min 2X/week        Progress Toward Goals  OT Goals(current goals can now be found in the care plan section)  Progress towards OT goals: Progressing toward goals     Plan Discharge plan remains appropriate    Co-evaluation    PT/OT/SLP Co-Evaluation/Treatment: Yes Reason for Co-Treatment: Complexity of the patient's impairments (multi-system involvement);Necessary to address cognition/behavior during functional activity;For patient/therapist safety;To address functional/ADL transfers   OT goals addressed during session: ADL's and self-care      AM-PAC OT "6 Clicks" Daily Activity     Outcome Measure   Help from another person eating meals?: None Help from another person taking care of personal grooming?: A Little Help from another person toileting, which includes using toliet, bedpan, or urinal?: A Lot Help from another person bathing (including washing, rinsing, drying)?: A Lot Help from another person to put on and taking off regular upper body clothing?: A Little Help from another person to put on and taking off regular lower body clothing?: A Lot 6 Click Score: 16    End of Session Equipment Utilized During Treatment: Rolling  walker (2 wheels);Oxygen  OT Visit Diagnosis: Other abnormalities of gait and mobility (R26.89);Muscle weakness (generalized) (M62.81)   Activity Tolerance Patient tolerated treatment well   Patient Left in chair;with call bell/phone within reach;with chair alarm set   Nurse Communication Mobility status        Time: 7116-5790 OT Time Calculation (min): 35 min  Charges: OT General Charges $OT Visit: 1 Visit OT Treatments $Therapeutic Activity: 8-22 mins  Shanon Payor, OTD OTR/L  07/30/22, 1:24 PM

## 2022-07-31 DIAGNOSIS — I959 Hypotension, unspecified: Secondary | ICD-10-CM | POA: Diagnosis not present

## 2022-07-31 DIAGNOSIS — I4819 Other persistent atrial fibrillation: Secondary | ICD-10-CM | POA: Diagnosis not present

## 2022-07-31 DIAGNOSIS — E662 Morbid (severe) obesity with alveolar hypoventilation: Secondary | ICD-10-CM | POA: Diagnosis not present

## 2022-07-31 DIAGNOSIS — J9611 Chronic respiratory failure with hypoxia: Secondary | ICD-10-CM | POA: Diagnosis not present

## 2022-07-31 LAB — GLUCOSE, CAPILLARY: Glucose-Capillary: 78 mg/dL (ref 70–99)

## 2022-07-31 LAB — URINALYSIS, COMPLETE (UACMP) WITH MICROSCOPIC
Bilirubin Urine: NEGATIVE
Glucose, UA: NEGATIVE mg/dL
Hgb urine dipstick: NEGATIVE
Ketones, ur: NEGATIVE mg/dL
Leukocytes,Ua: NEGATIVE
Nitrite: NEGATIVE
Protein, ur: NEGATIVE mg/dL
Specific Gravity, Urine: 1.018 (ref 1.005–1.030)
pH: 5 (ref 5.0–8.0)

## 2022-07-31 MED ORDER — AMIODARONE HCL 200 MG PO TABS
200.0000 mg | ORAL_TABLET | Freq: Two times a day (BID) | ORAL | Status: DC
  Administered 2022-07-31 – 2022-08-01 (×2): 200 mg via ORAL
  Filled 2022-07-31 (×2): qty 1

## 2022-07-31 NOTE — Progress Notes (Addendum)
Rounding Note    Patient Name: Ernest [name] Date of Encounter: 07/31/2022  [place] Cardiologist: Kate Sable, MD   Subjective   Resting comfortably in bed, ambulated with PT this morning Used his TED hose up to the knees, abdominal binder He reports plan is to discharge to [place] once bed is available Appears to have brief episode of atrial fibrillation this morning rates in the 120 range on ambulation.  Back to normal sinus rhythm rates in the 70s at rest  Inpatient Medications    Scheduled Meds:  amiodarone  200 mg Oral Daily   apixaban  5 mg Oral BID   fludrocortisone  0.2 mg Oral Daily   midodrine  15 mg Oral TID with meals   multivitamin  1 tablet Oral Daily   pantoprazole  40 mg Oral Daily   potassium chloride  20 mEq Oral BID   sodium chloride flush  3 mL Intravenous Q12H   Continuous Infusions:  sodium chloride     PRN Meds: acetaminophen **OR** acetaminophen, alum & mag hydroxide-simeth, dicyclomine, loperamide, ondansetron **OR** ondansetron (ZOFRAN) IV   Vital Signs    Vitals:   07/31/22 0729 07/31/22 0823 07/31/22 1113 07/31/22 1500  BP: 100/73  (!) 87/64 95/62  Pulse: 73  75 79  Resp: '20  18 20  '$ Temp: 98 F (36.7 C)  97.6 F (36.4 C) 98.4 F (36.9 C)  TempSrc: Oral  Oral Oral  SpO2: 100%  100% 100%  Weight:  (!) 140.5 kg    Height:        Intake/Output Summary (Last 24 hours) at 07/31/2022 1506 Last data filed at 07/31/2022 1046 Gross per 24 hour  Intake 480 ml  Output 400 ml  Net 80 ml      07/31/2022    8:23 AM 07/28/2022    2:36 PM 07/27/2022    4:02 AM  Last 3 Weights  Weight (lbs) 309 lb 12.8 oz 311 lb 1.1 oz 313 lb 11.4 oz  Weight (kg) 140.524 kg 141.1 kg 142.3 kg      Telemetry    Episode of atrial fibrillation this morning rate 120, back to normal sinus rhythm rate in the 70s- Personally Reviewed  ECG    - Personally Reviewed  Physical Exam   GEN: No acute distress.  Morbidly obese Neck: No  JVD Cardiac: RRR, no murmurs, rubs, or gallops.  Respiratory: Clear to auscultation bilaterally. GI: Soft, nontender, non-distended  MS: No edema; No deformity. Neuro:  Nonfocal  Psych: Normal affect   Labs    High Sensitivity Troponin:   Recent Labs  Lab 07/25/22 2101  TROPONINIHS 4     Chemistry Recent Labs  Lab 07/25/22 2101 07/26/22 1100 07/27/22 0704 07/28/22 0601 07/30/22 0426  NA 136 135 139 140 139  K 3.2* 2.7* 3.5 3.6 3.6  CL 93* 98 102 104 107  CO2 '31 30 31 29 28  '$ GLUCOSE 104* 106* 90 91 88  BUN '17 15 12 13 13  '$ CREATININE 1.08 1.09 0.97 0.97 0.91  CALCIUM 7.8* 7.2* 7.4* 7.7* 7.3*  MG  --  1.7 2.2  --  2.1  PROT 5.8*  --   --   --   --   ALBUMIN 2.4*  --   --   --   --   AST 19  --   --   --   --   ALT 9  --   --   --   --  ALKPHOS 71  --   --   --   --   BILITOT 1.3*  --   --   --   --   GFRNONAA >60 >60 >60 >60 >60  ANIONGAP '12 7 6 7 '$ 4*    Lipids No results for input(s): "CHOL", "TRIG", "HDL", "LABVLDL", "LDLCALC", "CHOLHDL" in the last 168 hours.  Hematology Recent Labs  Lab 07/26/22 0619 07/27/22 0704 07/30/22 0426  WBC 8.6 7.6 7.1  RBC 4.05* 4.07* 3.90*  HGB 10.6* 10.7* 10.2*  HCT 32.4* 33.6* 32.5*  MCV 80.0 82.6 83.3  MCH 26.2 26.3 26.2  MCHC 32.7 31.8 31.4  RDW 16.3* 17.2* 17.2*  PLT 252 242 213   Thyroid No results for input(s): "TSH", "FREET4" in the last 168 hours.  BNPNo results for input(s): "BNP", "PROBNP" in the last 168 hours.  DDimer No results for input(s): "DDIMER" in the last 168 hours.   Radiology    No results found.  Cardiac Studies   Echo February 2024 2. Left ventricular ejection fraction, by estimation, is 60 to 65%. The  left ventricle has normal function. The left ventricle has no regional  wall motion abnormalities. Left ventricular diastolic parameters are  indeterminate.   3. Right ventricular systolic function is normal. The right ventricular  size is mildly enlarged.   4. The mitral valve is normal  in structure. No evidence of mitral valve  regurgitation. No evidence of mitral stenosis.   5. The aortic valve was not well visualized. Aortic valve regurgitation  is not visualized. No aortic stenosis is present.   6. The inferior vena cava is normal in size with greater than 50%  respiratory variability, suggesting right atrial pressure of 3 mmHg.   7. Right atrial size was mildly dilated.   8. Unable to exclude abnormal rhythm, atrial fibrillation.  Comparison(s): 01/10/21 EF 50-55%.   Patient Profile     77 y.o. male with a past medical history of OSA/OHS on home O2, morbid obesity, persistent atrial fibrillation, partial SBO, who is being seen and evaluated for hypotension and atrial fibrillation.    Assessment & Plan    Persistent atrial fibrillation Underwent cardioversion July 28, 2022, Maintaining normal sinus rhythm since that time Brief episode atrial fibrillation this morning, back to normal sinus rhythm High risk of recurrent arrhythmia given body habitus, sleep disorder Unable to titrate up on rate controlling agents given hypotension For now we will increase amiodarone back to 200 twice daily, this could be decreased back to 200 daily in outpatient clinic On Eliquis 5 twice daily  Orthostatic hypotension Longstanding issue, suspect a neurocardiogenic component Mild response to midodrine 15 3 times daily, fludrocortisone increased up to 0.2 daily yesterday Elevated heart rate on exertion and standing, quick recovery on sitting If orthostasis symptoms persist, may need to consider other agents such as Northera  Untreated sleep apnea Notes indicating did not tolerate BiPAP in the past As outpatient would consider referral to pulmonary  Chronic diastolic CHF Addition of higher dose of Florinef will make management of diastolic CHF more challenging As outpatient taking Lasix 40 mg 3 times daily, would continue this regiment at discharge  Diarrhea Waxing waning  loose bowel movements On arrival reported several days of diarrhea likely exacerbating his orthostasis symptoms Reports episode yesterday Has been started on Bentyl as needed    Total encounter time more than 50 minutes  Greater than 50% was spent in counseling and coordination of care with the patient   For  questions or updates, please contact [place] Please consult [email or web address] for contact info under        Signed, Ida Rogue, MD  07/31/2022, 3:06 PM

## 2022-07-31 NOTE — Progress Notes (Signed)
PROGRESS NOTE    Ernest [name]  NWG:[id number] DOB: 1945-08-13 DOA: 07/25/2022 PCP: Ernest Athens, MD    Brief Narrative:  77 y.o. male with medical history significant for Class III obesity, chronic venous insufficiency of lower extremities on Lasix 40 mg 3 times a week, GERD, obesity hypoventilation syndrome and chronic respiratory failure on home O2 at 4L, HTN, recent diagnosis of A-fib, on amiodarone and Eliquis with plans for DC cardioversion later this month, chronic hypotension on midodrine started recently, who presents to the ED after having several presyncopal episodes.  Patient has been having intermittent diarrhea for the past month since he was started on the medication for his atrial fibrillation.  He has been using his midodrine as prescribed.  He denies chest pain, shortness of breath or palpitations.  He denies cough, fever or chills.  Denies vomiting or abdominal pain. ED course and data review: BP 87/61, with pulse in the 80s.  BP was briefly fluid responsive to 118/101 but then returned to SBP in the high 80s.  Labs significant for WBC 11,700.  Potassium 3.2.  Lipase and LFTs unremarkable.  Urinalysis unremarkable.EKG, personally reviewed and interpreted showed A-fib at 90 with nonspecific ST-T wave changes. Patient was treated with a 1.5 L NS bolus and started on IV potassium repletion and hospitalist consulted for admission.   2/3.  Repeat electrolytes show potassium of 2.7 magnesium 1.7.  Replace potassium IV and magnesium IV.  Patient still hypotensive and orthostatic and increased midodrine to 15 mg 3 times daily. 2/4.  Patient still orthostatic.  Continue gentle fluids and increase dose of midodrine.  Check an a.m. cortisol tomorrow morning.  Will consider starting Florinef if cortisol level okay.  Cardiology to try to set up TEE cardioversion. 2/5.  Cardioverted to normal sinus rhythm.  A.m. cortisol normal range.  Add Florinef to midodrine for orthostatic  hypotension. 2/6.  Patient still in normal sinus rhythm after cardioversion yesterday.  Still awaiting orthostatic vitals today.  Occupational therapy recommending rehab.  2/7: Abdominal binder has been put in place.  Symptoms with orthostasis is improved this morning however patient remained with postural tachycardia with ventricular rate up to 147  2/8: Abdominal binder remains in place.  Symptoms of orthostasis and POTS have improved.   Assessment & Plan:   Principal Problem:   Orthostatic hypotension Active Problems:   Hypomagnesemia   Diarrhea   Hypokalemia   Recurrent syncope   Obesity, Class III, BMI 40-49.9 (morbid obesity) (HCC)   Chronic respiratory failure with hypoxia (HCC)   Obesity hypoventilation syndrome (HCC)   Persistent atrial fibrillation (HCC)   Chronic anticoagulation   Chronic venous stasis  * Orthostatic hypotension Patient remains symptomatic though seems to be improving.  Midodrine was increased to 50 mg 3 times daily, Florinef increased to 20 mg daily.  Workup for VIPoma and serotonin syndrome currently in progress.  Abdominal binder now in place.  Presentation appears consistent with POTS at this time -Symptoms have improved Plan: Continue midodrine 15 mg 3 times daily.   Continue Florinef 0.2 mg daily Follow-up pending esoteric lab work, may be deferred to outpatient Continue PT as tolerated with abdominal binder in place Continue compression stocking therapy Anticipate discharge to skilled nursing facility 2/9  Hypomagnesemia Monitor and replace as necessary   Hypokalemia Monitor and replete as needed   Diarrhea Stool studies negative.  Could be secondary to amiodarone.  Trial of Bentyl to see if this helps.  Bentyl discontinued 2/6.  Diarrhea resolved  Recurrent syncope Secondary to orthostatic hypotension   Chronic venous stasis Hold Lasix for now   Persistent atrial fibrillation (HCC) Cardioverted 2/5 to normal sinus rhythm.   Cardiology decreased amiodarone dose down to 200 mg daily.  Continue anticoagulation with Eliquis.  Cardiology consider EP evaluation, can likely be referred to outpatient   Chronic respiratory failure with hypoxia ([place]) Obesity hypoventilation syndrome Patient is intolerant of BiPAP but uses home O2 Continue home oxygen 4 L   Obesity, Class III, BMI 40-49.9 (morbid obesity) ([place]) BMI 41.04   DVT prophylaxis: Apixaban Code Status: Full Family Communication: Disposition Plan: Status is: Inpatient Remains inpatient appropriate because: Symptomatic orthostatic hypotension.  Proving.  Anticipate discharge 2/9   Level of care: Progressive  Consultants:  Cardiology  Procedures:  None  Antimicrobials: None   Subjective: Seen and examined.  Sitting up in chair.  Working with physical therapy.  Abdominal binder in place.  Compression stockings in place.  No pain complaints.  Objective: Vitals:   07/30/22 2338 07/31/22 0439 07/31/22 0729 07/31/22 0823  BP: (!) 85/58 (!) 81/56 100/73   Pulse: 67 68 73   Resp: '18 18 20   '$ Temp: 97.7 F (36.5 C) 97.8 F (36.6 C) 98 F (36.7 C)   TempSrc:   Oral   SpO2: 100% 99% 100%   Weight:    (!) 140.5 kg  Height:        Intake/Output Summary (Last 24 hours) at 07/31/2022 1118 Last data filed at 07/31/2022 1046 Gross per 24 hour  Intake 960 ml  Output 600 ml  Net 360 ml   Filed Weights   07/27/22 0402 07/28/22 1436 07/31/22 0823  Weight: (!) 142.3 kg (!) 141.1 kg (!) 140.5 kg    Examination:  General exam: NAD Respiratory system: Bibasilar crackles.  Normal work of breathing.  4 L Cardiovascular system: S1-S2, RRR, no murmurs, trace pedal edema Gastrointestinal system: Obese, NT/ND, normal bowel sounds Central nervous system: Alert and oriented. No focal neurological deficits. Extremities: Symmetric 5 x 5 power. Skin: No rashes, lesions or ulcers Psychiatry: Judgement and insight appear normal. Mood & affect appropriate.      Data Reviewed: I have personally reviewed following labs and imaging studies  CBC: Recent Labs  Lab 07/25/22 2101 07/26/22 0619 07/27/22 0704 07/30/22 0426  WBC 11.7* 8.6 7.6 7.1  HGB 13.1 10.6* 10.7* 10.2*  HCT 40.8 32.4* 33.6* 32.5*  MCV 80.8 80.0 82.6 83.3  PLT 350 252 242 784   Basic Metabolic Panel: Recent Labs  Lab 07/25/22 2101 07/26/22 1100 07/27/22 0704 07/28/22 0601 07/30/22 0426  NA 136 135 139 140 139  K 3.2* 2.7* 3.5 3.6 3.6  CL 93* 98 102 104 107  CO2 '31 30 31 29 28  '$ GLUCOSE 104* 106* 90 91 88  BUN '17 15 12 13 13  '$ CREATININE 1.08 1.09 0.97 0.97 0.91  CALCIUM 7.8* 7.2* 7.4* 7.7* 7.3*  MG  --  1.7 2.2  --  2.1  PHOS  --   --  3.6  --  3.3   GFR: Estimated Creatinine Clearance: 101.7 mL/min (by C-G formula based on SCr of 0.91 mg/dL). Liver Function Tests: Recent Labs  Lab 07/25/22 2101  AST 19  ALT 9  ALKPHOS 71  BILITOT 1.3*  PROT 5.8*  ALBUMIN 2.4*   Recent Labs  Lab 07/25/22 2101  LIPASE 30   No results for input(s): "AMMONIA" in the last 168 hours. Coagulation Profile: Recent Labs  Lab 07/28/22 1233  INR  1.4*   Cardiac Enzymes: No results for input(s): "CKTOTAL", "CKMB", "CKMBINDEX", "TROPONINI" in the last 168 hours. BNP (last 3 results) No results for input(s): "PROBNP" in the last 8760 hours. HbA1C: No results for input(s): "HGBA1C" in the last 72 hours. CBG: Recent Labs  Lab 07/28/22 0521 07/29/22 0501 07/30/22 0538 07/30/22 2032 07/31/22 0441  GLUCAP 91 81 84 100* 78   Lipid Profile: No results for input(s): "CHOL", "HDL", "LDLCALC", "TRIG", "CHOLHDL", "LDLDIRECT" in the last 72 hours. Thyroid Function Tests: No results for input(s): "TSH", "T4TOTAL", "FREET4", "T3FREE", "THYROIDAB" in the last 72 hours. Anemia Panel: Recent Labs    07/30/22 0426  VITAMINB12 140*   Sepsis Labs: No results for input(s): "PROCALCITON", "LATICACIDVEN" in the last 168 hours.  Recent Results (from the past 240 hour(s))   Gastrointestinal Panel by PCR , Stool     Status: None   Collection Time: 07/26/22  1:39 AM   Specimen: STOOL  Result Value Ref Range Status   Campylobacter species NOT DETECTED NOT DETECTED Final   Plesimonas shigelloides NOT DETECTED NOT DETECTED Final   Salmonella species NOT DETECTED NOT DETECTED Final   Yersinia enterocolitica NOT DETECTED NOT DETECTED Final   Vibrio species NOT DETECTED NOT DETECTED Final   Vibrio cholerae NOT DETECTED NOT DETECTED Final   Enteroaggregative E coli (EAEC) NOT DETECTED NOT DETECTED Final   Enteropathogenic E coli (EPEC) NOT DETECTED NOT DETECTED Final   Enterotoxigenic E coli (ETEC) NOT DETECTED NOT DETECTED Final   Shiga like toxin producing E coli (STEC) NOT DETECTED NOT DETECTED Final   Shigella/Enteroinvasive E coli (EIEC) NOT DETECTED NOT DETECTED Final   Cryptosporidium NOT DETECTED NOT DETECTED Final   Cyclospora cayetanensis NOT DETECTED NOT DETECTED Final   Entamoeba histolytica NOT DETECTED NOT DETECTED Final   Giardia lamblia NOT DETECTED NOT DETECTED Final   Adenovirus F40/41 NOT DETECTED NOT DETECTED Final   Astrovirus NOT DETECTED NOT DETECTED Final   Norovirus GI/GII NOT DETECTED NOT DETECTED Final   Rotavirus A NOT DETECTED NOT DETECTED Final   Sapovirus (I, [name], IV, and V) NOT DETECTED NOT DETECTED Final    Comment: Performed at [facility], [address] [place], [place] [address]  C Difficile Quick Screen w PCR reflex     Status: None   Collection Time: 07/26/22  7:13 AM   Specimen: STOOL  Result Value Ref Range Status   C Diff antigen NEGATIVE NEGATIVE Final   C Diff toxin NEGATIVE NEGATIVE Final   C Diff interpretation No C. difficile detected.  Final    Comment: Performed at [facility], [address], [place],  [address]         Radiology Studies: No results found.      Scheduled Meds:  amiodarone  200 mg Oral Daily   apixaban  5 mg Oral BID   fludrocortisone  0.2 mg Oral  Daily   midodrine  15 mg Oral TID with meals   multivitamin  1 tablet Oral Daily   pantoprazole  40 mg Oral Daily   potassium chloride  20 mEq Oral BID   sodium chloride flush  3 mL Intravenous Q12H   Continuous Infusions:  sodium chloride       LOS: 5 days     Sidney Ace, MD [facility]   If 7PM-7AM, please contact night-coverage  07/31/2022, 11:18 AM

## 2022-07-31 NOTE — TOC Progression Note (Addendum)
Transition of Care [facility]) - Progression Note    Patient Details  Name: Ernest [name] MRN: [id number] Date of Birth: March 24, 1946  Transition of [facility] CM/SW [place], LCSW Phone Number: 07/31/2022, 11:05 AM  Clinical Narrative: Patient has accepted bed offer from [facility]. They will have a bed tomorrow. Will start auth when able.    1:47 pm: [employer].  Expected Discharge Plan: [facility] Barriers to Discharge: Continued Medical Work up  Expected Discharge Plan and Services   Discharge Planning Services: CM Consult   Living arrangements for the past 2 months: Single Family Home                                       Social Determinants of Health (SDOH) Interventions SDOH Screenings   Food Insecurity: No Food Insecurity (07/26/2022)  Housing: Low Risk  (07/26/2022)  Transportation Needs: No Transportation Needs (07/26/2022)  Utilities: Not At Risk (07/26/2022)  Alcohol Screen: Low Risk  (06/21/2021)  Depression (PHQ2-9): Low Risk  (06/21/2021)  Financial Resource Strain: Low Risk  (06/21/2021)  Physical Activity: Inactive (06/21/2021)  Social Connections: Unknown (06/21/2021)  Stress: No Stress Concern Present (06/21/2021)  Tobacco Use: Low Risk  (07/29/2022)    Readmission Risk Interventions     No data to display

## 2022-07-31 NOTE — Progress Notes (Signed)
Occupational Therapy Treatment Patient Details Name: Ernest [name] MRN: [id number] DOB: [birth year] Today's Date: 07/31/2022   History of present illness 76yoM Ernest [name], Ernest [name], comes to [facility] 2/2 after several 4-5 near syncopal episodes preceding day and 5 days diarrhea. Pt started on midodrine 2 weeks prior due to similar problem, pt notes success with meds. Pt followed by cardiology, consulting this admission, questions regarding symptoms attributed to amiodarone, pt previously scheduled for cardioversion 07/30/22. is now s/p cardioversion as of 07/28/22   OT comments  Chart reviewed, pt greeted in chair motivated for therapy session. Co tx completed with PT on this date. Tx session targeted improving functional activity tolerance in order to improve safe ADL completion at home. MAX A required for donning abdominal binder, binder is adjusted/cut for better fit. Progress is noted in functional amb tolerance and decrease in orthostatic symptoms as compared to yesterday. Pt continues to perform below baseline, discharge recommendation remains appropriate. OT will continue to follow acutely.    Recommendations for follow up therapy are one component of a multi-disciplinary discharge planning process, led by the attending physician.  Recommendations may be updated based on patient status, additional functional criteria and insurance authorization.    Follow Up Recommendations  Skilled nursing-short term rehab (<3 hours/day)     Assistance Recommended at Discharge Intermittent Supervision/Assistance  Patient can return home with the following  A lot of help with bathing/dressing/bathroom;Assistance with cooking/housework;Help with stairs or ramp for entrance;Assist for transportation;A little help with walking and/or transfers   Equipment Recommendations  BSC/3in1    Recommendations for Other Services      Precautions / Restrictions Precautions Precautions: Fall Precaution Comments: abdominal  binder (two together) and ted hose; Restrictions Weight Bearing Restrictions: No Other Position/Activity Restrictions: Orthostatic hypotension       Mobility Bed Mobility               General bed mobility comments: NT in recliner pre/post session    Transfers Overall transfer level: Needs assistance Equipment used: Rolling walker (2 wheels) Transfers: Sit to/from Stand Sit to Stand: Min guard                 Balance Overall balance assessment: Needs assistance Sitting-balance support: Feet supported Sitting balance-Leahy Scale: Good     Standing balance support: Bilateral upper extremity supported, During functional activity Standing balance-Leahy Scale: Fair                             ADL either performed or assessed with clinical judgement   ADL Overall ADL's : Needs assistance/impaired Eating/Feeding: Set up;Sitting               Upper Body Dressing : Minimal assistance Upper Body Dressing Details (indicate cue type and reason): robe Lower Body Dressing: Maximal assistance   Toilet Transfer: Min guard;Rolling walker (2 wheels);Ambulation Toilet Transfer Details (indicate cue type and reason): simulated         Functional mobility during ADLs: Supervision/safety;Min guard;Rolling walker (2 wheels) (18' with RW 2 attempts, 66' with RW 2 attempts with supervision-CGA)      Extremity/Trunk Assessment              Vision       Perception     Praxis      Cognition Arousal/Alertness: Awake/alert Behavior During Therapy: WFL for tasks assessed/performed Overall Cognitive Status: Within Functional Limits for tasks assessed  Exercises Other Exercises Other Exercises: edu re: continued use of abdominal binder, discharge recommendations, self monitoring for drop in Bp, pre syncopal episodes    Shoulder Instructions       General Comments abdominal binder with  compression hose throughout, pt BP 87/74 (MAP 80) HR 76 after first amb attempt. Pt reports slight worsening of dizziness symptoms, however improved from yesterday    Pertinent Vitals/ Pain       Pain Assessment Pain Assessment: No/denies pain  Home Living                                          Prior Functioning/Environment              Frequency  Min 2X/week        Progress Toward Goals  OT Goals(current goals can now be found in the care plan section)  Progress towards OT goals: Progressing toward goals     Plan Discharge plan remains appropriate    Co-evaluation    PT/OT/SLP Co-Evaluation/Treatment: Yes Reason for Co-Treatment: To address functional/ADL transfers;For patient/therapist safety   OT goals addressed during session: ADL's and self-care      AM-PAC OT "6 Clicks" Daily Activity     Outcome Measure   Help from another person eating meals?: None Help from another person taking care of personal grooming?: A Little Help from another person toileting, which includes using toliet, bedpan, or urinal?: A Lot Help from another person bathing (including washing, rinsing, drying)?: A Lot Help from another person to put on and taking off regular upper body clothing?: A Little Help from another person to put on and taking off regular lower body clothing?: A Lot 6 Click Score: 16    End of Session Equipment Utilized During Treatment: Rolling walker (2 wheels);Oxygen  OT Visit Diagnosis: Other abnormalities of gait and mobility (R26.89);Muscle weakness (generalized) (M62.81)   Activity Tolerance Patient tolerated treatment well   Patient Left in chair;with call bell/phone within reach;with chair alarm set   Nurse Communication Mobility status        Time: 5701-7793 OT Time Calculation (min): 28 min  Charges: OT General Charges $OT Visit: 1 Visit OT Treatments $Therapeutic Activity: 8-22 mins  Shanon Payor, OTD OTR/L  07/31/22,  1:40 PM

## 2022-07-31 NOTE — Progress Notes (Signed)
Physical Therapy Treatment Patient Details Name: Ernest [name] MRN: [id number] DOB: 28-Jun-1945 Today's Date: 07/31/2022   History of Present Illness 76yoM Ernest [name], II, comes to [facility] 2/2 after several 4-5 near syncopal episodes preceding day and 5 days diarrhea. Pt started on midodrine 2 weeks prior due to similar problem, pt notes success with meds. Pt followed by cardiology, consulting this admission, questions regarding symptoms attributed to amiodarone, pt previously scheduled for cardioversion 07/30/22. is now s/p cardioversion as of 07/28/22    PT Comments    PT session timed around midodrine medication again today, and abdominal binder with compression hose used again during mobility efforts today. The patient had increased activity tolerance this session. He walked 4 bouts of 3f x 2 and 220fx 2 with the rolling walker. With the first bout of walking, patient was able to verbalize feeling mildly lightheaded and need to sit with heart rate up to 135bpm, blood pressure 87/74. With short rest break (30 seconds- 1 minute) dizziness subsides and heart rate decreases to the 110's. The patient was able to walk 3 more times without dizziness, activity tolerance more by fatigue with heart rate up to the high 120's during activity. The patient could benefit from short term rehab at discharge and is requested by his spouse. PT will continue to follow to maximize independence and decrease caregiver burden.    Recommendations for follow up therapy are one component of a multi-disciplinary discharge planning process, led by the attending physician.  Recommendations may be updated based on patient status, additional functional criteria and insurance authorization.  Follow Up Recommendations  Skilled nursing-short term rehab (<3 hours/day) Can patient physically be transported by private vehicle: No   Assistance Recommended at Discharge Frequent or constant Supervision/Assistance  Patient can return  home with the following A lot of help with bathing/dressing/bathroom;Assist for transportation;Help with stairs or ramp for entrance;Assistance with cooking/housework;Two people to help with walking and/or transfers   Equipment Recommendations  None recommended by PT    Recommendations for Other Services       Precautions / Restrictions Precautions Precautions: Fall Restrictions Weight Bearing Restrictions: No     Mobility  Bed Mobility               General bed mobility comments: not addressed as patient sitting up on arrival and post session    Transfers Overall transfer level: Needs assistance Equipment used: Rolling walker (2 wheels) Transfers: Sit to/from Stand Sit to Stand: Min guard           General transfer comment: verbal cues for technique and safety cues to monitor for signs/symptoms of pre-syncope for fall prevention. patient demonstrated and verbalized good safety awareness.    Ambulation/Gait Ambulation/Gait assistance: Min guard Gait Distance (Feet):  (1863f 2; 41f33f2) Assistive device: Rolling walker (2 wheels) Gait Pattern/deviations: Step-through pattern, Trunk flexed Gait velocity: decreased     General Gait Details: patient ambulated 4 bouts with seated rest breaks required. with the first bout of walking, patient feels mild dizziness that he could verbalize need to sit. heart rate noted to be up to 135 bpm with walking bouts and decreased quickly to the 110's with short seated rest break. dizziness subsides after 30 seconds-1 minute of sitting. seated blood pressure after walking is 87/76 with heart rate of 76.   Stairs             Wheelchair Mobility    Modified Rankin (Stroke Patients Only)  Balance Overall balance assessment: Needs assistance Sitting-balance support: Feet supported Sitting balance-Leahy Scale: Good     Standing balance support: Bilateral upper extremity supported, During functional  activity Standing balance-Leahy Scale: Fair Standing balance comment: no external support required from therapist for standing with unilateral UE support during static standing                            Cognition Arousal/Alertness: Awake/alert Behavior During Therapy: WFL for tasks assessed/performed Overall Cognitive Status: Within Functional Limits for tasks assessed                                 General Comments: pleasent and cooperative        Exercises      General Comments General comments (skin integrity, edema, etc.): abdominal binder used throughout mobility efforts as well as compression hose. hte patient needs assistance from therapist to donn abdominal binder      Pertinent Vitals/Pain Pain Assessment Pain Assessment: No/denies pain    Home Living                          Prior Function            PT Goals (current goals can now be found in the care plan section) Acute Rehab PT Goals Patient Stated Goal: to return home, walk PT Goal Formulation: With patient Time For Goal Achievement: 08/10/22 Potential to Achieve Goals: Fair Progress towards PT goals: Progressing toward goals    Frequency    Min 2X/week      PT Plan Current plan remains appropriate    Co-evaluation PT/OT/SLP Co-Evaluation/Treatment: Yes Reason for Co-Treatment: To address functional/ADL transfers;For patient/therapist safety PT goals addressed during session: Mobility/safety with mobility        AM-PAC PT "6 Clicks" Mobility   Outcome Measure  Help needed turning from your back to your side while in a flat bed without using bedrails?: A Little Help needed moving from lying on your back to sitting on the side of a flat bed without using bedrails?: A Little Help needed moving to and from a bed to a chair (including a wheelchair)?: A Little Help needed standing up from a chair using your arms (e.g., wheelchair or bedside chair)?: A  Little Help needed to walk in hospital room?: A Lot Help needed climbing 3-5 steps with a railing? : Total 6 Click Score: 15    End of Session Equipment Utilized During Treatment: Oxygen Activity Tolerance: Patient tolerated treatment well Patient left: in chair;with call bell/phone within reach Nurse Communication: Mobility status PT Visit Diagnosis: Unsteadiness on feet (R26.81);Other abnormalities of gait and mobility (R26.89);Muscle weakness (generalized) (M62.81)     Time: 5726-2035 PT Time Calculation (min) (ACUTE ONLY): 30 min  Charges:  $Therapeutic Activity: 8-22 mins                     Minna Merritts, PT, MPT    [name] 07/31/2022, 10:05 AM

## 2022-08-01 DIAGNOSIS — K580 Irritable bowel syndrome with diarrhea: Secondary | ICD-10-CM | POA: Diagnosis not present

## 2022-08-01 DIAGNOSIS — K638219 Small intestinal bacterial overgrowth, unspecified: Secondary | ICD-10-CM | POA: Diagnosis not present

## 2022-08-01 DIAGNOSIS — J9601 Acute respiratory failure with hypoxia: Secondary | ICD-10-CM | POA: Diagnosis not present

## 2022-08-01 DIAGNOSIS — Z743 Need for continuous supervision: Secondary | ICD-10-CM | POA: Diagnosis not present

## 2022-08-01 DIAGNOSIS — R6889 Other general symptoms and signs: Secondary | ICD-10-CM | POA: Diagnosis not present

## 2022-08-01 DIAGNOSIS — I4819 Other persistent atrial fibrillation: Secondary | ICD-10-CM | POA: Diagnosis not present

## 2022-08-01 DIAGNOSIS — E876 Hypokalemia: Secondary | ICD-10-CM | POA: Diagnosis not present

## 2022-08-01 DIAGNOSIS — I471 Supraventricular tachycardia, unspecified: Secondary | ICD-10-CM | POA: Diagnosis not present

## 2022-08-01 DIAGNOSIS — R197 Diarrhea, unspecified: Secondary | ICD-10-CM | POA: Diagnosis not present

## 2022-08-01 DIAGNOSIS — K219 Gastro-esophageal reflux disease without esophagitis: Secondary | ICD-10-CM | POA: Diagnosis not present

## 2022-08-01 DIAGNOSIS — G4733 Obstructive sleep apnea (adult) (pediatric): Secondary | ICD-10-CM | POA: Diagnosis not present

## 2022-08-01 DIAGNOSIS — I959 Hypotension, unspecified: Secondary | ICD-10-CM | POA: Diagnosis not present

## 2022-08-01 DIAGNOSIS — F1090 Alcohol use, unspecified, uncomplicated: Secondary | ICD-10-CM | POA: Diagnosis not present

## 2022-08-01 DIAGNOSIS — E662 Morbid (severe) obesity with alveolar hypoventilation: Secondary | ICD-10-CM | POA: Diagnosis not present

## 2022-08-01 DIAGNOSIS — M150 Primary generalized (osteo)arthritis: Secondary | ICD-10-CM | POA: Diagnosis not present

## 2022-08-01 DIAGNOSIS — I951 Orthostatic hypotension: Secondary | ICD-10-CM | POA: Diagnosis not present

## 2022-08-01 DIAGNOSIS — M170 Bilateral primary osteoarthritis of knee: Secondary | ICD-10-CM | POA: Diagnosis not present

## 2022-08-01 DIAGNOSIS — I10 Essential (primary) hypertension: Secondary | ICD-10-CM | POA: Diagnosis not present

## 2022-08-01 DIAGNOSIS — D649 Anemia, unspecified: Secondary | ICD-10-CM | POA: Diagnosis not present

## 2022-08-01 DIAGNOSIS — R55 Syncope and collapse: Secondary | ICD-10-CM | POA: Diagnosis not present

## 2022-08-01 DIAGNOSIS — J9611 Chronic respiratory failure with hypoxia: Secondary | ICD-10-CM | POA: Diagnosis not present

## 2022-08-01 DIAGNOSIS — Z9981 Dependence on supplemental oxygen: Secondary | ICD-10-CM | POA: Diagnosis not present

## 2022-08-01 DIAGNOSIS — I872 Venous insufficiency (chronic) (peripheral): Secondary | ICD-10-CM | POA: Diagnosis not present

## 2022-08-01 DIAGNOSIS — Z7901 Long term (current) use of anticoagulants: Secondary | ICD-10-CM | POA: Diagnosis not present

## 2022-08-01 LAB — GLUCOSE, CAPILLARY: Glucose-Capillary: 80 mg/dL (ref 70–99)

## 2022-08-01 LAB — TRYPTASE: Tryptase: 4.6 ug/L (ref 2.2–13.2)

## 2022-08-01 MED ORDER — LOPERAMIDE HCL 2 MG PO CAPS: 2.0000 mg | ORAL_CAPSULE | ORAL | 0 refills | Status: DC | PRN

## 2022-08-01 MED ORDER — FUROSEMIDE 40 MG PO TABS: 40.0000 mg | ORAL_TABLET | Freq: Every day | ORAL | 3 refills | Status: DC | PRN

## 2022-08-01 MED ORDER — DICYCLOMINE HCL 10 MG PO CAPS: 10.0000 mg | ORAL_CAPSULE | Freq: Three times a day (TID) | ORAL | Status: DC | PRN

## 2022-08-01 MED ORDER — FLUDROCORTISONE ACETATE 0.1 MG PO TABS: 0.2000 mg | ORAL_TABLET | Freq: Every day | ORAL | Status: DC

## 2022-08-01 MED ORDER — MIDODRINE HCL 10 MG PO TABS: 15.0000 mg | ORAL_TABLET | Freq: Three times a day (TID) | ORAL | 3 refills | Status: DC

## 2022-08-01 NOTE — Plan of Care (Signed)

## 2022-08-01 NOTE — Care Management Important Message (Signed)
Important Message  Patient Details  Name: Ernest [name] MRN: [id number] Date of Birth: 1945-11-05   Medicare Important Message Given:  Yes     Dannette Barbara 08/01/2022, 2:19 PM

## 2022-08-01 NOTE — Discharge Summary (Addendum)
Physician Discharge Summary  CORBITT FOSS II [id number] DOB: 09-Jun-1946 DOA: 07/25/2022  PCP: [name], MD  Admit date: 07/25/2022 Discharge date: 08/01/2022  Admitted From: Home Disposition:  SNF  Recommendations for Outpatient Follow-up:  Follow up with PCP in 1-2 weeks Outpatient referral to pulmonary  Home Health:No Equipment/Devices: Thigh-high compression stockings, abdominal binder, oxygen via nasal cannula 2 to 4 L  Discharge Condition: Stable CODE STATUS: DNR Diet recommendation: Regular  Brief/Interim Summary: 77 y.o. male with medical history significant for Class III obesity, chronic venous insufficiency of lower extremities on Lasix 40 mg 3 times a week, GERD, obesity hypoventilation syndrome and chronic respiratory failure on home O2 at 4L, HTN, recent diagnosis of A-fib, on amiodarone and Eliquis with plans for DC cardioversion later this month, chronic hypotension on midodrine started recently, who presents to the ED after having several presyncopal episodes.  Patient has been having intermittent diarrhea for the past month since he was started on the medication for his atrial fibrillation.  He has been using his midodrine as prescribed.  He denies chest pain, shortness of breath or palpitations.  He denies cough, fever or chills.  Denies vomiting or abdominal pain. ED course and data review: BP 87/61, with pulse in the 80s.  BP was briefly fluid responsive to 118/101 but then returned to SBP in the high 80s.  Labs significant for WBC 11,700.  Potassium 3.2.  Lipase and LFTs unremarkable.  Urinalysis unremarkable.EKG, personally reviewed and interpreted showed A-fib at 90 with nonspecific ST-T wave changes. Patient was treated with a 1.5 L NS bolus and started on IV potassium repletion and hospitalist consulted for admission.   2/3.  Repeat electrolytes show potassium of 2.7 magnesium 1.7.  Replace potassium IV and magnesium IV.  Patient still hypotensive and  orthostatic and increased midodrine to 15 mg 3 times daily. 2/4.  Patient still orthostatic.  Continue gentle fluids and increase dose of midodrine.  Check an a.m. cortisol tomorrow morning.  Will consider starting Florinef if cortisol level okay.  Cardiology to try to set up TEE cardioversion. 2/5.  Cardioverted to normal sinus rhythm.  A.m. cortisol normal range.  Add Florinef to midodrine for orthostatic hypotension. 2/6.  Patient still in normal sinus rhythm after cardioversion yesterday.  Still awaiting orthostatic vitals today.  Occupational therapy recommending rehab.   2/7: Abdominal binder has been put in place.  Symptoms with orthostasis is improved this morning however patient remained with postural tachycardia with ventricular rate up to 147   2/8: Abdominal binder remains in place.  Symptoms of orthostasis and POTS have improved. 2/9: Abdominal binder in place.  Symptoms of orthostasis have markedly improved.  Appropriate for discharge to skilled nursing facility at this time.  Outpatient follow-up with PCP, cardiology.  Consider referral to outpatient pulmonology for untreated obstructive sleep apnea.    Discharge Diagnoses:  Principal Problem:   Hypotension Active Problems:   Hypomagnesemia   Diarrhea   Hypokalemia   Recurrent syncope   Obesity, Class III, BMI 40-49.9 (morbid obesity) (HCC)   Chronic respiratory failure with hypoxia (HCC)   Obesity hypoventilation syndrome (HCC)   Persistent atrial fibrillation (HCC)   Chronic anticoagulation   Chronic venous stasis  * Orthostatic hypotension Patient remains symptomatic though seems to be improving.  Midodrine was increased to 50 mg 3 times daily, Florinef increased to 20 mg daily.  Workup for VIPoma and serotonin syndrome currently in progress.  Abdominal binder now in place.  Presentation appears consistent with POTS  at this time -Symptoms have improved Plan: Stable for discharge to skilled facility.  Continue  midodrine 15 mg 3 times daily.  Continue Florinef 20 mg daily.  Continue abdominal binder, thigh-high compression stockings.  Follow-up outpatient esoteric lab workup.  Continue therapy while at SNF with regular ambulation.  Diarrhea Stool studies negative.  Could be secondary to amiodarone.  Trial of Bentyl to see if this helps.  Bentyl discontinued 2/6.  Diarrhea resolved   Recurrent syncope Secondary to orthostatic hypotension   Chronic venous stasis Hold Lasix for now.  This has been held at time of discharge.  Will need outpatient follow-up to determine when to safely restart this medication   Persistent atrial fibrillation ([place]) Cardioverted 2/5 to normal sinus rhythm.  Cardiology decreased amiodarone dose down to 200 mg daily.  Continue anticoagulation with Eliquis.  Cardiology consider EP evaluation, can likely be referred to outpatient   Chronic respiratory failure with hypoxia ([place]) Obesity hypoventilation syndrome Patient is intolerant of BiPAP but uses home O2 Continue home oxygen 4 L   Obesity, Class III, BMI 40-49.9 (morbid obesity) ([place]) BMI 41.04  Discharge Instructions  Discharge Instructions     Diet - low sodium heart healthy   Complete by: As directed    Increase activity slowly   Complete by: As directed    No wound care   Complete by: As directed       Allergies as of 08/01/2022   No Known Allergies      Medication List     TAKE these medications    acetaminophen 325 MG tablet Commonly known as: TYLENOL Take 325 mg by mouth every 8 (eight) hours as needed for moderate pain.   amiodarone 200 MG tablet Commonly known as: PACERONE TAKE 1 TABLET BY MOUTH TWICE A DAY FOR 30 DAYS. THEN TAKE 1 TABLET EVERY DAY   apixaban 5 MG Tabs tablet Commonly known as: ELIQUIS Take 1 tablet (5 mg total) by mouth 2 (two) times daily.   CENTRUM SILVER 50+MEN PO Take 1 tablet by mouth in the morning. What changed: Another medication with the same name was  removed. Continue taking this medication, and follow the directions you see here.   dicyclomine 10 MG capsule Commonly known as: BENTYL Take 1 capsule (10 mg total) by mouth 3 (three) times daily as needed for spasms.   fludrocortisone 0.1 MG tablet Commonly known as: FLORINEF Take 2 tablets (0.2 mg total) by mouth daily. Start taking on: August 02, 2022   furosemide 40 MG tablet Commonly known as: LASIX Take 1 tablet (40 mg total) by mouth daily as needed for fluid or edema. Take one tablet (64m) three times a week. What changed:  how much to take how to take this when to take this reasons to take this   loperamide 2 MG capsule Commonly known as: IMODIUM Take 1 capsule (2 mg total) by mouth as needed for diarrhea or loose stools.   midodrine 10 MG tablet Commonly known as: PROAMATINE Take 1.5 tablets (15 mg total) by mouth 3 (three) times daily. What changed: how much to take   nystatin powder Commonly known as: MYCOSTATIN/NYSTOP Apply 1 Application topically 3 (three) times daily.   pantoprazole 40 MG tablet Commonly known as: PROTONIX Take 1 tablet (40 mg total) by mouth daily.   potassium chloride SA 20 MEQ tablet Commonly known as: KLOR-CON M Take 1 tablet (20 mEq total) by mouth 3 (three) times a week. What changed:  when to take  this additional instructions   PROBIOTIC DAILY PO Take by mouth.   traMADol 50 MG tablet Commonly known as: ULTRAM Take 50 mg by mouth every 6 (six) hours as needed for moderate pain.        Contact information for after-discharge care     [facility] Preferred SNF .   Service: Skilled Nursing Contact information: [place] [phone]                    No Known Allergies  Consultations: Cardiology   Procedures/Studies: [facility] Chest Port 1 View  Result Date:  07/26/2022 CLINICAL DATA:  [id number] Syncope [id number] EXAM: PORTABLE CHEST 1 VIEW COMPARISON:  Chest x-ray 10/05/2020, CT chest 10/05/2020 FINDINGS: Stable enlarged cardiac silhouette. The heart and mediastinal contours are unchanged. Aortic calcification. No focal consolidation. No pulmonary edema. Nonspecific blunting of left costophrenic angle. Trace left pleural effusion not excluded. No right pleural effusion effusion. No pneumothorax. No acute osseous abnormality. IMPRESSION: 1. No active disease. 2. Trace left pleural effusion not excluded. 3.  Aortic Atherosclerosis (ICD10-I70.0). Electronically Signed   By: Iven Finn M.D.   On: 07/26/2022 02:27   ECHOCARDIOGRAM COMPLETE  Result Date: 07/09/2022    ECHOCARDIOGRAM REPORT   Patient Name:   CONCETTO SINGLETON II Date of Exam: 07/08/2022 Medical Rec #:  [id number]          Height:       73.0 in Accession #:    [id number]         Weight:       314.4 lb Date of Birth:  [birth year]          BSA:          2.608 m Patient Age:    84 years           BP:           132/74 mmHg Patient Gender: M                  HR:           110 bpm. Exam Location:  [facility] Procedure: 2D Echo, Cardiac Doppler and Color Doppler Indications:    R06.9 DOE  History:        Patient has prior history of Echocardiogram examinations, most                 recent 01/10/2021. Arrythmias:Tachycardia and Atrial                 Fibrillation; Risk Factors:Sleep Apnea, Hypertension and                 Non-Smoker.  Sonographer:    Wilkie Aye RVT RCS Referring Phys: [phone] [place]  Sonographer Comments: Technically challenging study due to limited acoustic windows, Technically difficult study due to poor echo windows and patient is obese. Image acquisition challenging due to respiratory motion, Image acquisition challenging due to patient body habitus, Image acquisition challenging due to breast implants and on oxygen. Unable to administer Definity due to supply shortage. IMPRESSIONS  1.  Challenging images.  2. Left ventricular ejection fraction, by estimation, is 60 to 65%. The left ventricle has normal function. The left ventricle has no regional wall motion abnormalities. Left ventricular diastolic parameters are indeterminate.  3. Right ventricular systolic function is normal. The right ventricular size is mildly enlarged.  4.  The mitral valve is normal in structure. No evidence of mitral valve regurgitation. No evidence of mitral stenosis.  5. The aortic valve was not well visualized. Aortic valve regurgitation is not visualized. No aortic stenosis is present.  6. The inferior vena cava is normal in size with greater than 50% respiratory variability, suggesting right atrial pressure of 3 mmHg.  7. Right atrial size was mildly dilated.  8. Unable to exclude abnormal rhythm, atrial fibrillation. Comparison(s): 01/10/21 EF 50-55%. FINDINGS  Left Ventricle: Left ventricular ejection fraction, by estimation, is 60 to 65%. The left ventricle has normal function. The left ventricle has no regional wall motion abnormalities. The left ventricular internal cavity size was normal in size. There is  no left ventricular hypertrophy. Left ventricular diastolic parameters are indeterminate. Right Ventricle: The right ventricular size is mildly enlarged. No increase in right ventricular wall thickness. Right ventricular systolic function is normal. Left Atrium: Left atrial size was normal in size. Right Atrium: Right atrial size was mildly dilated. Pericardium: There is no evidence of pericardial effusion. Mitral Valve: The mitral valve is normal in structure. No evidence of mitral valve regurgitation. No evidence of mitral valve stenosis. Tricuspid Valve: The tricuspid valve is normal in structure. Tricuspid valve regurgitation is not demonstrated. No evidence of tricuspid stenosis. Aortic Valve: The aortic valve was not well visualized. Aortic valve regurgitation is not visualized. No aortic stenosis is  present. Aortic valve mean gradient measures 2.3 mmHg. Aortic valve peak gradient measures 4.4 mmHg. Aortic valve area, by VTI measures 4.18 cm. Pulmonic Valve: The pulmonic valve was normal in structure. Pulmonic valve regurgitation is not visualized. No evidence of pulmonic stenosis. Aorta: The aortic root is normal in size and structure. Venous: The inferior vena cava is normal in size with greater than 50% respiratory variability, suggesting right atrial pressure of 3 mmHg. IAS/Shunts: No atrial level shunt detected by color flow Doppler.  LEFT VENTRICLE PLAX 2D LVOT diam:     2.40 cm LV SV:         58 LV SV Index:   22 LVOT Area:     4.52 cm  RIGHT VENTRICLE RV S prime:     23.30 cm/s AORTIC VALVE AV Area (Vmax):    3.94 cm AV Area (Vmean):   4.16 cm AV Area (VTI):     4.18 cm AV Vmax:           104.30 cm/s AV Vmean:          68.633 cm/s AV VTI:            0.138 m AV Peak Grad:      4.4 mmHg AV Mean Grad:      2.3 mmHg LVOT Vmax:         90.73 cm/s LVOT Vmean:        63.167 cm/s LVOT VTI:          0.127 m LVOT/AV VTI ratio: 0.92  AORTA Ao Root diam: 3.85 cm Ao Asc diam:  3.70 cm Ao Arch diam: 3.3 cm MITRAL VALVE MV Area (PHT): 3.45 cm    SHUNTS MV Decel Time: 220 msec    Systemic VTI:  0.13 m MV E velocity: 70.60 cm/s  Systemic Diam: 2.40 cm MV A velocity: 38.80 cm/s MV E/A ratio:  1.82 Ida Rogue MD Electronically signed by Ida Rogue MD Signature Date/Time: 07/09/2022/1:45:23 PM    Final       Subjective: Seen and examined on the day of discharge.  Stable no distress.  Stable for discharge to skilled nursing facility.  Discharge Exam: Vitals:   08/01/22 0432 08/01/22 0817  BP:  93/65  Pulse:  67  Resp:  14  Temp:  98 F (36.7 C)  SpO2: 100% 100%   Vitals:   08/01/22 0430 08/01/22 0432 08/01/22 0817 08/01/22 0851  BP: (!) 85/62  93/65   Pulse: (!) 59  67   Resp: (!) 22  14   Temp: (!) 97.3 F (36.3 C)  98 F (36.7 C)   TempSrc:   Oral   SpO2: 100% 100% 100%   Weight:     (!) 140.7 kg  Height:        General: Pt is alert, awake, not in acute distress Cardiovascular: RRR, S1/S2 +, no rubs, no gallops Respiratory: CTA bilaterally, no wheezing, no rhonchi Abdominal: Soft, NT, ND, bowel sounds + Extremities: no edema, no cyanosis    The results of significant diagnostics from this hospitalization (including imaging, microbiology, ancillary and laboratory) are listed below for reference.     Microbiology: Recent Results (from the past 240 hour(s))  Gastrointestinal Panel by PCR , Stool     Status: None   Collection Time: 07/26/22  1:39 AM   Specimen: STOOL  Result Value Ref Range Status   Campylobacter species NOT DETECTED NOT DETECTED Final   Plesimonas shigelloides NOT DETECTED NOT DETECTED Final   Salmonella species NOT DETECTED NOT DETECTED Final   Yersinia enterocolitica NOT DETECTED NOT DETECTED Final   Vibrio species NOT DETECTED NOT DETECTED Final   Vibrio cholerae NOT DETECTED NOT DETECTED Final   Enteroaggregative E coli (EAEC) NOT DETECTED NOT DETECTED Final   Enteropathogenic E coli (EPEC) NOT DETECTED NOT DETECTED Final   Enterotoxigenic E coli (ETEC) NOT DETECTED NOT DETECTED Final   Shiga like toxin producing E coli (STEC) NOT DETECTED NOT DETECTED Final   Shigella/Enteroinvasive E coli (EIEC) NOT DETECTED NOT DETECTED Final   Cryptosporidium NOT DETECTED NOT DETECTED Final   Cyclospora cayetanensis NOT DETECTED NOT DETECTED Final   Entamoeba histolytica NOT DETECTED NOT DETECTED Final   Giardia lamblia NOT DETECTED NOT DETECTED Final   Adenovirus F40/41 NOT DETECTED NOT DETECTED Final   Astrovirus NOT DETECTED NOT DETECTED Final   Norovirus GI/GII NOT DETECTED NOT DETECTED Final   Rotavirus A NOT DETECTED NOT DETECTED Final   Sapovirus (I, II, IV, and V) NOT DETECTED NOT DETECTED Final    Comment: Performed at [facility], [address] [place], [place] [address]  C Difficile Quick Screen w PCR reflex     Status: None    Collection Time: 07/26/22  7:13 AM   Specimen: STOOL  Result Value Ref Range Status   C Diff antigen NEGATIVE NEGATIVE Final   C Diff toxin NEGATIVE NEGATIVE Final   C Diff interpretation No C. difficile detected.  Final    Comment: Performed at [facility], [address] [place], [place] [address]     Labs: BNP (last 3 results) Recent Labs    07/04/22 1537  BNP [id number]*   Basic Metabolic Panel: Recent Labs  Lab 07/25/22 2101 07/26/22 1100 07/27/22 0704 07/28/22 0601 07/30/22 0426  NA 136 135 139 140 139  K 3.2* 2.7* 3.5 3.6 3.6  CL 93* 98 102 104 107  CO2 31 30 31 29 28  $ GLUCOSE 104* 106* 90 91 88  BUN 17 15 12 13 13  $ CREATININE 1.08 1.09 0.97 0.97 0.91  CALCIUM 7.8* 7.2*  7.4* 7.7* 7.3*  MG  --  1.7 2.2  --  2.1  PHOS  --   --  3.6  --  3.3   Liver Function Tests: Recent Labs  Lab 07/25/22 2101  AST 19  ALT 9  ALKPHOS 71  BILITOT 1.3*  PROT 5.8*  ALBUMIN 2.4*   Recent Labs  Lab 07/25/22 2101  LIPASE 30   No results for input(s): "AMMONIA" in the last 168 hours. CBC: Recent Labs  Lab 07/25/22 2101 07/26/22 0619 07/27/22 0704 07/30/22 0426  WBC 11.7* 8.6 7.6 7.1  HGB 13.1 10.6* 10.7* 10.2*  HCT 40.8 32.4* 33.6* 32.5*  MCV 80.8 80.0 82.6 83.3  PLT 350 252 242 213   Cardiac Enzymes: No results for input(s): "CKTOTAL", "CKMB", "CKMBINDEX", "TROPONINI" in the last 168 hours. BNP: Invalid input(s): "POCBNP" CBG: Recent Labs  Lab 07/29/22 0501 07/30/22 0538 07/30/22 2032 07/31/22 0441 08/01/22 0615  GLUCAP 81 84 100* 78 80   D-Dimer No results for input(s): "DDIMER" in the last 72 hours. Hgb A1c No results for input(s): "HGBA1C" in the last 72 hours. Lipid Profile No results for input(s): "CHOL", "HDL", "LDLCALC", "TRIG", "CHOLHDL", "LDLDIRECT" in the last 72 hours. Thyroid function studies No results for input(s): "TSH", "T4TOTAL", "T3FREE", "THYROIDAB" in the last 72 hours.  Invalid input(s): "FREET3" Anemia work  up Recent Labs    07/30/22 0426  VITAMINB12 140*   Urinalysis    Component Value Date/Time   COLORURINE YELLOW (A) 07/31/2022 1211   APPEARANCEUR HAZY (A) 07/31/2022 1211   LABSPEC 1.018 07/31/2022 1211   PHURINE 5.0 07/31/2022 1211   GLUCOSEU NEGATIVE 07/31/2022 1211   HGBUR NEGATIVE 07/31/2022 1211   BILIRUBINUR NEGATIVE 07/31/2022 1211   KETONESUR NEGATIVE 07/31/2022 1211   PROTEINUR NEGATIVE 07/31/2022 1211   NITRITE NEGATIVE 07/31/2022 1211   LEUKOCYTESUR NEGATIVE 07/31/2022 1211   Sepsis Labs Recent Labs  Lab 07/25/22 2101 07/26/22 0619 07/27/22 0704 07/30/22 0426  WBC 11.7* 8.6 7.6 7.1   Microbiology Recent Results (from the past 240 hour(s))  Gastrointestinal Panel by PCR , Stool     Status: None   Collection Time: 07/26/22  1:39 AM   Specimen: STOOL  Result Value Ref Range Status   Campylobacter species NOT DETECTED NOT DETECTED Final   Plesimonas shigelloides NOT DETECTED NOT DETECTED Final   Salmonella species NOT DETECTED NOT DETECTED Final   Yersinia enterocolitica NOT DETECTED NOT DETECTED Final   Vibrio species NOT DETECTED NOT DETECTED Final   Vibrio cholerae NOT DETECTED NOT DETECTED Final   Enteroaggregative E coli (EAEC) NOT DETECTED NOT DETECTED Final   Enteropathogenic E coli (EPEC) NOT DETECTED NOT DETECTED Final   Enterotoxigenic E coli (ETEC) NOT DETECTED NOT DETECTED Final   Shiga like toxin producing E coli (STEC) NOT DETECTED NOT DETECTED Final   Shigella/Enteroinvasive E coli (EIEC) NOT DETECTED NOT DETECTED Final   Cryptosporidium NOT DETECTED NOT DETECTED Final   Cyclospora cayetanensis NOT DETECTED NOT DETECTED Final   Entamoeba histolytica NOT DETECTED NOT DETECTED Final   Giardia lamblia NOT DETECTED NOT DETECTED Final   Adenovirus F40/41 NOT DETECTED NOT DETECTED Final   Astrovirus NOT DETECTED NOT DETECTED Final   Norovirus GI/GII NOT DETECTED NOT DETECTED Final   Rotavirus A NOT DETECTED NOT DETECTED Final   Sapovirus (I, II,  IV, and V) NOT DETECTED NOT DETECTED Final    Comment: Performed at [facility], [address], [place], [place] [address]  C Difficile Quick Screen w PCR  reflex     Status: None   Collection Time: 07/26/22  7:13 AM   Specimen: STOOL  Result Value Ref Range Status   C Diff antigen NEGATIVE NEGATIVE Final   C Diff toxin NEGATIVE NEGATIVE Final   C Diff interpretation No C. difficile detected.  Final    Comment: Performed at [facility], [address] [place], [place] [address]     Time coordinating discharge: Over 30 minutes  SIGNED:   Sidney Ace, MD  [facility] 08/01/2022, 11:39 AM Pager   If 7PM-7AM, please contact night-coverage

## 2022-08-01 NOTE — Progress Notes (Signed)
[name] to be D/C'd Skilled nursing facility( [place] room 504 per MD order.  Wife at bedside. Report given to Amy RN at [employer].  Allergies as of 08/01/2022   No Known Allergies      Medication List     TAKE these medications    acetaminophen 325 MG tablet Commonly known as: TYLENOL Take 325 mg by mouth every 8 (eight) hours as needed for moderate pain.   amiodarone 200 MG tablet Commonly known as: PACERONE TAKE 1 TABLET BY MOUTH TWICE A DAY FOR 30 DAYS. THEN TAKE 1 TABLET EVERY DAY   apixaban 5 MG Tabs tablet Commonly known as: ELIQUIS Take 1 tablet (5 mg total) by mouth 2 (two) times daily.   CENTRUM SILVER 50+MEN PO Take 1 tablet by mouth in the morning. What changed: Another medication with the same name was removed. Continue taking this medication, and follow the directions you see here.   dicyclomine 10 MG capsule Commonly known as: BENTYL Take 1 capsule (10 mg total) by mouth 3 (three) times daily as needed for spasms.   fludrocortisone 0.1 MG tablet Commonly known as: FLORINEF Take 2 tablets (0.2 mg total) by mouth daily. Start taking on: August 02, 2022   furosemide 40 MG tablet Commonly known as: LASIX Take 1 tablet (40 mg total) by mouth daily as needed for fluid or edema. Take one tablet (50m) three times a week. What changed:  how much to take how to take this when to take this reasons to take this   loperamide 2 MG capsule Commonly known as: IMODIUM Take 1 capsule (2 mg total) by mouth as needed for diarrhea or loose stools.   midodrine 10 MG tablet Commonly known as: PROAMATINE Take 1.5 tablets (15 mg total) by mouth 3 (three) times daily. What changed: how much to take   nystatin powder Commonly known as: MYCOSTATIN/NYSTOP Apply 1 Application topically 3 (three) times daily.   pantoprazole 40 MG tablet Commonly known as: PROTONIX Take 1 tablet (40 mg total) by mouth daily.   potassium chloride SA 20 MEQ  tablet Commonly known as: KLOR-CON M Take 1 tablet (20 mEq total) by mouth 3 (three) times a week. What changed:  when to take this additional instructions   PROBIOTIC DAILY PO Take by mouth.   traMADol 50 MG tablet Commonly known as: ULTRAM Take 50 mg by mouth every 6 (six) hours as needed for moderate pain.        Vitals:   08/01/22 0817 08/01/22 1208  BP: 93/65 (!) 91/55  Pulse: 67 72  Resp: 14 14  Temp: 98 F (36.7 C) 98.5 F (36.9 C)  SpO2: 100% 100%   Tele box removed and returned. IV catheter discontinued intact. Site without signs and symptoms of complications. Dressing and pressure applied. Pt denies pain at this time. No complaints noted.  An After Visit Summary was printed and given to the patient. Patient escorted via stretcher, and D/C to SNF via [facility].  ERolley Sims

## 2022-08-01 NOTE — TOC Progression Note (Signed)
Transition of Care [facility]) - Progression Note    Patient Details  Name: Ernest [name] MRN: [id number] Date of Birth: Nov 04, 1945  [facility]) CM/SW [place], LCSW Phone Number: 08/01/2022, 8:39 AM  Clinical Narrative: [place] is requesting a peer-to-peer review. Sent information to MD. Deadline to call is 1:00.    Expected Discharge Plan: [place] Barriers to Discharge: Continued Medical Work up  Expected Discharge Plan and Services   Discharge Planning Services: CM Consult   Living arrangements for the past 2 months: Single Family Home                                       Social Determinants of Health (SDOH) Interventions SDOH Screenings   Food Insecurity: No Food Insecurity (07/26/2022)  Housing: Low Risk  (07/26/2022)  Transportation Needs: No Transportation Needs (07/26/2022)  Utilities: Not At Risk (07/26/2022)  Alcohol Screen: Low Risk  (06/21/2021)  Depression (PHQ2-9): Low Risk  (06/21/2021)  Financial Resource Strain: Low Risk  (06/21/2021)  Physical Activity: Inactive (06/21/2021)  Social Connections: Unknown (06/21/2021)  Stress: No Stress Concern Present (06/21/2021)  Tobacco Use: Low Risk  (07/29/2022)    Readmission Risk Interventions     No data to display

## 2022-08-01 NOTE — Progress Notes (Signed)
Rounding Note    Patient Name: Ernest [name] Date of Encounter: 08/01/2022  [facility] Cardiologist: Kate Sable, MD   Subjective   No events overnight, blood pressure remains low Continues to have loose bowel movements but denies diarrhea Continued orthostasis but reports he is asymptomatic Excited to be going to rehab today  Inpatient Medications    Scheduled Meds:  amiodarone  200 mg Oral BID   apixaban  5 mg Oral BID   fludrocortisone  0.2 mg Oral Daily   midodrine  15 mg Oral TID with meals   multivitamin  1 tablet Oral Daily   pantoprazole  40 mg Oral Daily   potassium chloride  20 mEq Oral BID   sodium chloride flush  3 mL Intravenous Q12H   Continuous Infusions:  sodium chloride     PRN Meds: acetaminophen **OR** acetaminophen, alum & mag hydroxide-simeth, dicyclomine, loperamide, ondansetron **OR** ondansetron (ZOFRAN) IV   Vital Signs    Vitals:   08/01/22 0432 08/01/22 0817 08/01/22 0851 08/01/22 1208  BP:  93/65  (!) 91/55  Pulse:  67  72  Resp:  14  14  Temp:  98 F (36.7 C)  98.5 F (36.9 C)  TempSrc:  Oral  Oral  SpO2: 100% 100%  100%  Weight:   (!) 140.7 kg   Height:        Intake/Output Summary (Last 24 hours) at 08/01/2022 1500 Last data filed at 08/01/2022 0851 Gross per 24 hour  Intake --  Output 550 ml  Net -550 ml      08/01/2022    8:51 AM 07/31/2022    8:23 AM 07/28/2022    2:36 PM  Last 3 Weights  Weight (lbs) 310 lb 1.6 oz 309 lb 12.8 oz 311 lb 1.1 oz  Weight (kg) 140.66 kg 140.524 kg 141.1 kg      Telemetry    Episode of atrial fibrillation this morning rate 120, back to normal sinus rhythm rate in the 70s- Personally Reviewed  ECG    - Personally Reviewed  Physical Exam   Constitutional:  oriented to person, place, and time. No distress.  HENT:  Head: Grossly normal Eyes:  no discharge. No scleral icterus.  Neck: No JVD, no carotid bruits  Cardiovascular: Regular rate and rhythm, no murmurs  appreciated Pulmonary/Chest: Clear to auscultation bilaterally, no wheezes or rails Abdominal: Soft.  no distension.  no tenderness.  Musculoskeletal: Normal range of motion Neurological:  normal muscle tone. Coordination normal. No atrophy Skin: Skin warm and dry Psychiatric: normal affect, pleasant   Labs    High Sensitivity Troponin:   Recent Labs  Lab 07/25/22 2101  TROPONINIHS 4     Chemistry Recent Labs  Lab 07/25/22 2101 07/26/22 1100 07/27/22 0704 07/28/22 0601 07/30/22 0426  NA 136 135 139 140 139  K 3.2* 2.7* 3.5 3.6 3.6  CL 93* 98 102 104 107  CO2 31 30 31 29 28  $ GLUCOSE 104* 106* 90 91 88  BUN 17 15 12 13 13  $ CREATININE 1.08 1.09 0.97 0.97 0.91  CALCIUM 7.8* 7.2* 7.4* 7.7* 7.3*  MG  --  1.7 2.2  --  2.1  PROT 5.8*  --   --   --   --   ALBUMIN 2.4*  --   --   --   --   AST 19  --   --   --   --   ALT 9  --   --   --   --  ALKPHOS 71  --   --   --   --   BILITOT 1.3*  --   --   --   --   GFRNONAA >60 >60 >60 >60 >60  ANIONGAP 12 7 6 7 $ 4*    Lipids No results for input(s): "CHOL", "TRIG", "HDL", "LABVLDL", "LDLCALC", "CHOLHDL" in the last 168 hours.  Hematology Recent Labs  Lab 07/26/22 0619 07/27/22 0704 07/30/22 0426  WBC 8.6 7.6 7.1  RBC 4.05* 4.07* 3.90*  HGB 10.6* 10.7* 10.2*  HCT 32.4* 33.6* 32.5*  MCV 80.0 82.6 83.3  MCH 26.2 26.3 26.2  MCHC 32.7 31.8 31.4  RDW 16.3* 17.2* 17.2*  PLT 252 242 213   Thyroid No results for input(s): "TSH", "FREET4" in the last 168 hours.  BNPNo results for input(s): "BNP", "PROBNP" in the last 168 hours.  DDimer No results for input(s): "DDIMER" in the last 168 hours.   Radiology    No results found.  Cardiac Studies   Echo February 2024 2. Left ventricular ejection fraction, by estimation, is 60 to 65%. The  left ventricle has normal function. The left ventricle has no regional  wall motion abnormalities. Left ventricular diastolic parameters are  indeterminate.   3. Right ventricular systolic  function is normal. The right ventricular  size is mildly enlarged.   4. The mitral valve is normal in structure. No evidence of mitral valve  regurgitation. No evidence of mitral stenosis.   5. The aortic valve was not well visualized. Aortic valve regurgitation  is not visualized. No aortic stenosis is present.   6. The inferior vena cava is normal in size with greater than 50%  respiratory variability, suggesting right atrial pressure of 3 mmHg.   7. Right atrial size was mildly dilated.   8. Unable to exclude abnormal rhythm, atrial fibrillation.  Comparison(s): 01/10/21 EF 50-55%.   Patient Profile     77 y.o. male with a past medical history of OSA/OHS on home O2, morbid obesity, persistent atrial fibrillation, partial SBO, who is being seen and evaluated for hypotension and atrial fibrillation.    Assessment & Plan    Persistent atrial fibrillation Underwent cardioversion July 28, 2022, Maintaining normal sinus rhythm Remains high risk of recurrent arrhythmia given body habitus, sleep disorder Unable to titrate up on rate controlling agents given hypotension Would recommend he stay on amiodarone 200 twice daily until seen in outpatient clinic at which time dose could be decreased down to 200 daily On Eliquis 5 twice daily  Orthostatic hypotension Longstanding issue, suspect a neurocardiogenic component Mild response to midodrine 15 milligram 3 times daily, fludrocortisone  0.2 daily yesterday Continues to have elevated heart rate on standing and ambulation with relatively quick recovery Wearing abdominal binders, thigh-high compression hose We have recommended he be cautious with his Lasix  Untreated sleep apnea Notes indicating did not tolerate BiPAP in the past Consider referral to pulmonary for sleep apnea  Chronic diastolic CHF Addition of higher dose of Florinef will make management of diastolic CHF more challenging Previously taking Lasix 40 mg 3 days a week,  recommend he take Lasix as needed for ankle swelling abdominal distention, shortness of breath  Diarrhea Waxing waning loose bowel movements Reports having small bowel obstruction, adhesions, bowel overgrowth Reports that he has Pepto-Bismol, Has been started on Bentyl as needed    Total encounter time more than 50 minutes  Greater than 50% was spent in counseling and coordination of care with the patient   For  questions or updates, please contact [place] Please consult [email or web address] for contact info under        Signed, Ida Rogue, MD  08/01/2022, 3:00 PM

## 2022-08-01 NOTE — TOC Transition Note (Addendum)
Transition of Care [facility]) - CM/SW Discharge Note   Patient Details  Name: Ernest [name] MRN: [id number] Date of Birth: 12-07-1945  Transition of Care [facility] CM/SW Contact:  Laurena Slimmer, RN Phone Number: 08/01/2022, 11:54 AM   Clinical Narrative:   Patient peer to peer completed. Patient approved for SNF  Spoke with Magda Paganini in admissions from [facility]. F Auth number provided.  Patient assigned room 505 Nurse will call report to [phone] Face sheet, and medical necessity forms printed to the floor to be added to the EMS packet Patient wife notified EMS arranged.  Discharge summary and transfer report sent in the [place].   TOC signing off.     Final next level of care: Home w Home Health Services Barriers to Discharge: Continued Medical Work up   Patient Goals and CMS Choice CMS Medicare.gov Compare Post Acute Care list provided to:: Patient Choice offered to / list presented to : Spouse  Discharge Placement                         Discharge Plan and Services Additional resources added to the After Visit Summary for     Discharge Planning Services: CM Consult                                 Social Determinants of Health (SDOH) Interventions SDOH Screenings   Food Insecurity: No Food Insecurity (07/26/2022)  Housing: Low Risk  (07/26/2022)  Transportation Needs: No Transportation Needs (07/26/2022)  Utilities: Not At Risk (07/26/2022)  Alcohol Screen: Low Risk  (06/21/2021)  Depression (PHQ2-9): Low Risk  (06/21/2021)  Financial Resource Strain: Low Risk  (06/21/2021)  Physical Activity: Inactive (06/21/2021)  Social Connections: Unknown (06/21/2021)  Stress: No Stress Concern Present (06/21/2021)  Tobacco Use: Low Risk  (07/29/2022)     Readmission Risk Interventions     No data to display

## 2022-08-04 DIAGNOSIS — K580 Irritable bowel syndrome with diarrhea: Secondary | ICD-10-CM | POA: Diagnosis not present

## 2022-08-04 DIAGNOSIS — I4819 Other persistent atrial fibrillation: Secondary | ICD-10-CM | POA: Diagnosis not present

## 2022-08-04 DIAGNOSIS — I872 Venous insufficiency (chronic) (peripheral): Secondary | ICD-10-CM | POA: Diagnosis not present

## 2022-08-04 DIAGNOSIS — I951 Orthostatic hypotension: Secondary | ICD-10-CM | POA: Diagnosis not present

## 2022-08-04 DIAGNOSIS — R55 Syncope and collapse: Secondary | ICD-10-CM | POA: Diagnosis not present

## 2022-08-04 DIAGNOSIS — J9611 Chronic respiratory failure with hypoxia: Secondary | ICD-10-CM | POA: Diagnosis not present

## 2022-08-04 DIAGNOSIS — K638219 Small intestinal bacterial overgrowth, unspecified: Secondary | ICD-10-CM | POA: Diagnosis not present

## 2022-08-04 LAB — MISC LABCORP TEST (SEND OUT)
LabCorp test name: 24
Labcorp test code: 4069

## 2022-08-05 DIAGNOSIS — K219 Gastro-esophageal reflux disease without esophagitis: Secondary | ICD-10-CM | POA: Diagnosis not present

## 2022-08-05 DIAGNOSIS — R55 Syncope and collapse: Secondary | ICD-10-CM | POA: Diagnosis not present

## 2022-08-05 DIAGNOSIS — J9611 Chronic respiratory failure with hypoxia: Secondary | ICD-10-CM | POA: Diagnosis not present

## 2022-08-05 DIAGNOSIS — K580 Irritable bowel syndrome with diarrhea: Secondary | ICD-10-CM | POA: Diagnosis not present

## 2022-08-05 DIAGNOSIS — I872 Venous insufficiency (chronic) (peripheral): Secondary | ICD-10-CM | POA: Diagnosis not present

## 2022-08-05 DIAGNOSIS — I951 Orthostatic hypotension: Secondary | ICD-10-CM | POA: Diagnosis not present

## 2022-08-05 DIAGNOSIS — I4819 Other persistent atrial fibrillation: Secondary | ICD-10-CM | POA: Diagnosis not present

## 2022-08-05 DIAGNOSIS — M150 Primary generalized (osteo)arthritis: Secondary | ICD-10-CM | POA: Diagnosis not present

## 2022-08-08 DIAGNOSIS — I951 Orthostatic hypotension: Secondary | ICD-10-CM | POA: Diagnosis not present

## 2022-08-08 DIAGNOSIS — I872 Venous insufficiency (chronic) (peripheral): Secondary | ICD-10-CM | POA: Diagnosis not present

## 2022-08-08 DIAGNOSIS — R55 Syncope and collapse: Secondary | ICD-10-CM | POA: Diagnosis not present

## 2022-08-08 DIAGNOSIS — K638219 Small intestinal bacterial overgrowth, unspecified: Secondary | ICD-10-CM | POA: Diagnosis not present

## 2022-08-08 DIAGNOSIS — I4819 Other persistent atrial fibrillation: Secondary | ICD-10-CM | POA: Diagnosis not present

## 2022-08-08 DIAGNOSIS — J9611 Chronic respiratory failure with hypoxia: Secondary | ICD-10-CM | POA: Diagnosis not present

## 2022-08-08 DIAGNOSIS — K580 Irritable bowel syndrome with diarrhea: Secondary | ICD-10-CM | POA: Diagnosis not present

## 2022-08-09 DIAGNOSIS — J9601 Acute respiratory failure with hypoxia: Secondary | ICD-10-CM | POA: Diagnosis not present

## 2022-08-12 ENCOUNTER — Encounter: Payer: Self-pay | Admitting: Cardiology

## 2022-08-14 ENCOUNTER — Encounter: Payer: Self-pay | Admitting: Cardiology

## 2022-08-15 ENCOUNTER — Encounter (INDEPENDENT_AMBULATORY_CARE_PROVIDER_SITE_OTHER): Payer: Medicare Other | Admitting: Cardiology

## 2022-08-15 DIAGNOSIS — I4819 Other persistent atrial fibrillation: Secondary | ICD-10-CM | POA: Diagnosis not present

## 2022-08-15 DIAGNOSIS — R55 Syncope and collapse: Secondary | ICD-10-CM | POA: Diagnosis not present

## 2022-08-15 DIAGNOSIS — K638219 Small intestinal bacterial overgrowth, unspecified: Secondary | ICD-10-CM | POA: Diagnosis not present

## 2022-08-15 DIAGNOSIS — K580 Irritable bowel syndrome with diarrhea: Secondary | ICD-10-CM | POA: Diagnosis not present

## 2022-08-15 DIAGNOSIS — I872 Venous insufficiency (chronic) (peripheral): Secondary | ICD-10-CM | POA: Diagnosis not present

## 2022-08-15 DIAGNOSIS — I959 Hypotension, unspecified: Secondary | ICD-10-CM | POA: Diagnosis not present

## 2022-08-15 DIAGNOSIS — I951 Orthostatic hypotension: Secondary | ICD-10-CM | POA: Diagnosis not present

## 2022-08-15 DIAGNOSIS — J9611 Chronic respiratory failure with hypoxia: Secondary | ICD-10-CM | POA: Diagnosis not present

## 2022-08-17 ENCOUNTER — Emergency Department: Payer: Medicare Other

## 2022-08-17 ENCOUNTER — Other Ambulatory Visit: Payer: Self-pay

## 2022-08-17 ENCOUNTER — Encounter: Payer: Self-pay | Admitting: Internal Medicine

## 2022-08-17 ENCOUNTER — Inpatient Hospital Stay
Admission: EM | Admit: 2022-08-17 | Discharge: 2022-08-21 | DRG: 312 | Disposition: A | Discharge status: Skilled Nursing Facility | Payer: Medicare Other | Attending: Obstetrics and Gynecology | Admitting: Obstetrics and Gynecology

## 2022-08-17 ENCOUNTER — Encounter: Payer: Self-pay | Admitting: Pulmonary Disease

## 2022-08-17 ENCOUNTER — Encounter: Payer: Self-pay | Admitting: Cardiology

## 2022-08-17 ENCOUNTER — Observation Stay: Payer: Medicare Other

## 2022-08-17 DIAGNOSIS — Z7901 Long term (current) use of anticoagulants: Secondary | ICD-10-CM

## 2022-08-17 DIAGNOSIS — I10 Essential (primary) hypertension: Secondary | ICD-10-CM | POA: Diagnosis not present

## 2022-08-17 DIAGNOSIS — J9611 Chronic respiratory failure with hypoxia: Secondary | ICD-10-CM | POA: Diagnosis not present

## 2022-08-17 DIAGNOSIS — I951 Orthostatic hypotension: Secondary | ICD-10-CM | POA: Diagnosis not present

## 2022-08-17 DIAGNOSIS — M170 Bilateral primary osteoarthritis of knee: Secondary | ICD-10-CM | POA: Diagnosis present

## 2022-08-17 DIAGNOSIS — R404 Transient alteration of awareness: Secondary | ICD-10-CM | POA: Diagnosis not present

## 2022-08-17 DIAGNOSIS — Z79899 Other long term (current) drug therapy: Secondary | ICD-10-CM

## 2022-08-17 DIAGNOSIS — R531 Weakness: Secondary | ICD-10-CM | POA: Diagnosis not present

## 2022-08-17 DIAGNOSIS — Y92003 Bedroom of unspecified non-institutional (private) residence as the place of occurrence of the external cause: Secondary | ICD-10-CM

## 2022-08-17 DIAGNOSIS — Z800 Family history of malignant neoplasm of digestive organs: Secondary | ICD-10-CM | POA: Diagnosis not present

## 2022-08-17 DIAGNOSIS — Z6841 Body Mass Index (BMI) 40.0 and over, adult: Secondary | ICD-10-CM

## 2022-08-17 DIAGNOSIS — E876 Hypokalemia: Secondary | ICD-10-CM | POA: Diagnosis not present

## 2022-08-17 DIAGNOSIS — Z9981 Dependence on supplemental oxygen: Secondary | ICD-10-CM

## 2022-08-17 DIAGNOSIS — G4733 Obstructive sleep apnea (adult) (pediatric): Secondary | ICD-10-CM | POA: Diagnosis not present

## 2022-08-17 DIAGNOSIS — Z66 Do not resuscitate: Secondary | ICD-10-CM | POA: Diagnosis present

## 2022-08-17 DIAGNOSIS — W19XXXA Unspecified fall, initial encounter: Secondary | ICD-10-CM | POA: Diagnosis not present

## 2022-08-17 DIAGNOSIS — D519 Vitamin B12 deficiency anemia, unspecified: Secondary | ICD-10-CM | POA: Diagnosis present

## 2022-08-17 DIAGNOSIS — R55 Syncope and collapse: Secondary | ICD-10-CM | POA: Diagnosis not present

## 2022-08-17 DIAGNOSIS — I959 Hypotension, unspecified: Secondary | ICD-10-CM | POA: Diagnosis not present

## 2022-08-17 DIAGNOSIS — R6889 Other general symptoms and signs: Secondary | ICD-10-CM | POA: Diagnosis not present

## 2022-08-17 DIAGNOSIS — E538 Deficiency of other specified B group vitamins: Secondary | ICD-10-CM | POA: Diagnosis not present

## 2022-08-17 DIAGNOSIS — D513 Other dietary vitamin B12 deficiency anemia: Secondary | ICD-10-CM

## 2022-08-17 DIAGNOSIS — K219 Gastro-esophageal reflux disease without esophagitis: Secondary | ICD-10-CM | POA: Diagnosis not present

## 2022-08-17 DIAGNOSIS — W1830XA Fall on same level, unspecified, initial encounter: Secondary | ICD-10-CM | POA: Diagnosis present

## 2022-08-17 DIAGNOSIS — G909 Disorder of the autonomic nervous system, unspecified: Secondary | ICD-10-CM | POA: Diagnosis not present

## 2022-08-17 DIAGNOSIS — I4819 Other persistent atrial fibrillation: Secondary | ICD-10-CM | POA: Diagnosis present

## 2022-08-17 DIAGNOSIS — I110 Hypertensive heart disease with heart failure: Secondary | ICD-10-CM | POA: Diagnosis not present

## 2022-08-17 DIAGNOSIS — D72829 Elevated white blood cell count, unspecified: Secondary | ICD-10-CM | POA: Diagnosis not present

## 2022-08-17 DIAGNOSIS — K529 Noninfective gastroenteritis and colitis, unspecified: Secondary | ICD-10-CM | POA: Diagnosis not present

## 2022-08-17 DIAGNOSIS — R296 Repeated falls: Secondary | ICD-10-CM | POA: Diagnosis not present

## 2022-08-17 DIAGNOSIS — E8809 Other disorders of plasma-protein metabolism, not elsewhere classified: Secondary | ICD-10-CM | POA: Diagnosis not present

## 2022-08-17 DIAGNOSIS — S0990XA Unspecified injury of head, initial encounter: Secondary | ICD-10-CM | POA: Diagnosis not present

## 2022-08-17 DIAGNOSIS — D649 Anemia, unspecified: Secondary | ICD-10-CM | POA: Diagnosis present

## 2022-08-17 DIAGNOSIS — I5032 Chronic diastolic (congestive) heart failure: Secondary | ICD-10-CM | POA: Diagnosis not present

## 2022-08-17 DIAGNOSIS — E66813 Obesity, class 3: Secondary | ICD-10-CM | POA: Diagnosis present

## 2022-08-17 DIAGNOSIS — Z743 Need for continuous supervision: Secondary | ICD-10-CM | POA: Diagnosis not present

## 2022-08-17 LAB — MAGNESIUM: Magnesium: 1.7 mg/dL (ref 1.7–2.4)

## 2022-08-17 LAB — COMPREHENSIVE METABOLIC PANEL
ALT: 16 U/L (ref 0–44)
AST: 31 U/L (ref 15–41)
Albumin: 2 g/dL — ABNORMAL LOW (ref 3.5–5.0)
Alkaline Phosphatase: 88 U/L (ref 38–126)
Anion gap: 13 (ref 5–15)
BUN: 15 mg/dL (ref 8–23)
CO2: 23 mmol/L (ref 22–32)
Calcium: 7.7 mg/dL — ABNORMAL LOW (ref 8.9–10.3)
Chloride: 101 mmol/L (ref 98–111)
Creatinine, Ser: 1.15 mg/dL (ref 0.61–1.24)
GFR, Estimated: >60 mL/min (ref >60)
Glucose, Bld: 93 mg/dL (ref 70–99)
Potassium: 2.8 mmol/L — ABNORMAL LOW (ref 3.5–5.1)
Sodium: 137 mmol/L (ref 135–145)
Total Bilirubin: 0.8 mg/dL (ref 0.3–1.2)
Total Protein: 5.3 g/dL — ABNORMAL LOW (ref 6.5–8.1)

## 2022-08-17 LAB — URINALYSIS, COMPLETE (UACMP) WITH MICROSCOPIC
Bacteria, UA: NONE SEEN
Bilirubin Urine: NEGATIVE
Glucose, UA: NEGATIVE mg/dL
Hgb urine dipstick: NEGATIVE
Ketones, ur: NEGATIVE mg/dL
Leukocytes,Ua: NEGATIVE
Nitrite: NEGATIVE
Protein, ur: NEGATIVE mg/dL
RBC / HPF: >50 RBC/hpf (ref 0–5)
Specific Gravity, Urine: 1.011 (ref 1.005–1.030)
pH: 5 (ref 5.0–8.0)

## 2022-08-17 LAB — CBC WITH DIFFERENTIAL/PLATELET
Abs Immature Granulocytes: 0.06 10*3/uL (ref 0.00–0.07)
Basophils Absolute: 0 10*3/uL (ref 0.0–0.1)
Basophils Relative: 0 %
Eosinophils Absolute: 0 10*3/uL (ref 0.0–0.5)
Eosinophils Relative: 0 %
HCT: 37.5 % — ABNORMAL LOW (ref 39.0–52.0)
Hemoglobin: 11.8 g/dL — ABNORMAL LOW (ref 13.0–17.0)
Immature Granulocytes: 0 %
Lymphocytes Relative: 14 %
Lymphs Abs: 2.1 10*3/uL (ref 0.7–4.0)
MCH: 26.9 pg (ref 26.0–34.0)
MCHC: 31.5 g/dL (ref 30.0–36.0)
MCV: 85.4 fL (ref 80.0–100.0)
Monocytes Absolute: 0.9 10*3/uL (ref 0.1–1.0)
Monocytes Relative: 7 %
Neutro Abs: 11.2 10*3/uL — ABNORMAL HIGH (ref 1.7–7.7)
Neutrophils Relative %: 79 %
Platelets: 290 10*3/uL (ref 150–400)
RBC: 4.39 MIL/uL (ref 4.22–5.81)
RDW: 17.2 % — ABNORMAL HIGH (ref 11.5–15.5)
WBC: 14.3 10*3/uL — ABNORMAL HIGH (ref 4.0–10.5)
nRBC: 0 % (ref 0.0–0.2)

## 2022-08-17 LAB — PHOSPHORUS: Phosphorus: 2.8 mg/dL (ref 2.5–4.6)

## 2022-08-17 LAB — TROPONIN I (HIGH SENSITIVITY): Troponin I (High Sensitivity): 4 ng/L (ref <18)

## 2022-08-17 LAB — BRAIN NATRIURETIC PEPTIDE: B Natriuretic Peptide: 138.3 pg/mL — ABNORMAL HIGH (ref 0.0–100.0)

## 2022-08-17 MED ORDER — POTASSIUM CHLORIDE CRYS ER 20 MEQ PO TBCR
40.0000 meq | EXTENDED_RELEASE_TABLET | Freq: Once | ORAL | Status: DC
  Filled 2022-08-17: qty 2

## 2022-08-17 MED ORDER — ADULT MULTIVITAMIN W/MINERALS CH
1.0000 | ORAL_TABLET | Freq: Every day | ORAL | Status: DC
  Administered 2022-08-18 – 2022-08-21 (×4): 1 via ORAL
  Filled 2022-08-17 (×4): qty 1

## 2022-08-17 MED ORDER — TRAMADOL HCL 50 MG PO TABS: 50.0000 mg | ORAL_TABLET | Freq: Four times a day (QID) | ORAL | Status: DC | PRN

## 2022-08-17 MED ORDER — PANTOPRAZOLE SODIUM 40 MG PO TBEC
40.0000 mg | DELAYED_RELEASE_TABLET | Freq: Every day | ORAL | Status: DC
  Administered 2022-08-17 – 2022-08-18 (×2): 40 mg via ORAL
  Filled 2022-08-17 (×2): qty 1

## 2022-08-17 MED ORDER — DICYCLOMINE HCL 10 MG PO CAPS
10.0000 mg | ORAL_CAPSULE | Freq: Three times a day (TID) | ORAL | Status: DC | PRN
  Administered 2022-08-19 – 2022-08-21 (×3): 10 mg via ORAL
  Filled 2022-08-17 (×4): qty 1

## 2022-08-17 MED ORDER — MIDODRINE HCL 5 MG PO TABS
15.0000 mg | ORAL_TABLET | Freq: Three times a day (TID) | ORAL | Status: DC
  Administered 2022-08-18 – 2022-08-21 (×11): 15 mg via ORAL
  Filled 2022-08-17 (×12): qty 3

## 2022-08-17 MED ORDER — APIXABAN 5 MG PO TABS: 5.0000 mg | ORAL_TABLET | Freq: Two times a day (BID) | ORAL | Status: DC

## 2022-08-17 MED ORDER — ALBUTEROL SULFATE (2.5 MG/3ML) 0.083% IN NEBU: 3.0000 mL | INHALATION_SOLUTION | RESPIRATORY_TRACT | Status: DC | PRN

## 2022-08-17 MED ORDER — ACETAMINOPHEN 325 MG PO TABS: 650.0000 mg | ORAL_TABLET | Freq: Four times a day (QID) | ORAL | Status: DC | PRN

## 2022-08-17 MED ORDER — POTASSIUM CHLORIDE 10 MEQ/100ML IV SOLN
10.0000 meq | INTRAVENOUS | Status: AC
  Administered 2022-08-17 (×2): 10 meq via INTRAVENOUS
  Filled 2022-08-17: qty 100

## 2022-08-17 MED ORDER — MIDODRINE HCL 5 MG PO TABS
15.0000 mg | ORAL_TABLET | Freq: Once | ORAL | Status: AC
  Administered 2022-08-17: 15 mg via ORAL
  Filled 2022-08-17: qty 3

## 2022-08-17 MED ORDER — PROBIOTIC DAILY PO CAPS: 1.0000 | ORAL_CAPSULE | Freq: Every day | ORAL | Status: DC

## 2022-08-17 MED ORDER — SODIUM CHLORIDE 0.9 % IV BOLUS
500.0000 mL | Freq: Once | INTRAVENOUS | Status: AC
  Administered 2022-08-17: 500 mL via INTRAVENOUS

## 2022-08-17 MED ORDER — LOPERAMIDE HCL 2 MG PO CAPS: 2.0000 mg | ORAL_CAPSULE | ORAL | Status: DC | PRN

## 2022-08-17 MED ORDER — AMIODARONE HCL 200 MG PO TABS
200.0000 mg | ORAL_TABLET | Freq: Two times a day (BID) | ORAL | Status: DC
  Administered 2022-08-17 – 2022-08-18 (×2): 200 mg via ORAL
  Filled 2022-08-17 (×2): qty 1

## 2022-08-17 MED ORDER — DIPHENHYDRAMINE HCL 50 MG/ML IJ SOLN
12.5000 mg | Freq: Three times a day (TID) | INTRAMUSCULAR | Status: DC | PRN
  Administered 2022-08-18: 12.5 mg via INTRAVENOUS
  Filled 2022-08-17: qty 1

## 2022-08-17 MED ORDER — FLUDROCORTISONE ACETATE 0.1 MG PO TABS
0.2000 mg | ORAL_TABLET | Freq: Every day | ORAL | Status: DC
  Administered 2022-08-17 – 2022-08-21 (×5): 0.2 mg via ORAL
  Filled 2022-08-17 (×5): qty 2

## 2022-08-17 MED ORDER — POTASSIUM CHLORIDE CRYS ER 20 MEQ PO TBCR
40.0000 meq | EXTENDED_RELEASE_TABLET | ORAL | Status: AC
  Administered 2022-08-17 (×2): 40 meq via ORAL
  Filled 2022-08-17: qty 2

## 2022-08-17 NOTE — Progress Notes (Signed)
Patient returned from [place].  Refuses Midodrine-Dr Blaine Hamper notified.

## 2022-08-17 NOTE — ED Provider Notes (Signed)
[facility] Provider Note    Event Date/Time   First MD Initiated Contact with Patient 08/17/22 1334     (approximate)   History   Chief Complaint: Fall (Patient is here today after sustaining a mechanical fall at home; He has a history of unstable A-fib (recent cardioversion, takes Amiodarone) and orthostatic hypotension (takes Midodrine); No head injury, denies LOC, says he was attempting to sit down in a chair when his legs "gave out"; He does not have any home help except for his wife and states he needs more care than she can provide)   HPI  GURKARAN SCHUCH II is a 77 y.o. male with a history of atrial fibrillation, bowel obstruction, GERD, orthostatic hypotension, obesity who is brought to the ED due to fall at home.  Patient reports that he went to the bathroom, and when walking back to his bed afterward, he got lightheaded and passed out.  Initial blood pressure was reportedly about 70/40.  He has been compliant with all of his medications lately.  Denies headache or head injury.  No recent illness, normal oral intake.     Physical Exam   Triage Vital Signs: ED Triage Vitals  Enc Vitals Group     BP 08/17/22 1152 94/67     Pulse Rate 08/17/22 1152 75     Resp 08/17/22 1152 18     Temp 08/17/22 1152 98.1 F (36.7 C)     Temp Source 08/17/22 1152 Oral     SpO2 08/17/22 1152 99 %     Weight 08/17/22 1151 (!) 310 lb 3 oz (140.7 kg)     Height 08/17/22 1151 '6\' 1"'$  (1.854 m)     Head Circumference --      Peak Flow --      Pain Score 08/17/22 1151 0     Pain Loc --      Pain Edu? --      Excl. in [place]? --     Most recent vital signs: Vitals:   08/17/22 1152  BP: 94/67  Pulse: 75  Resp: 18  Temp: 98.1 F (36.7 C)  SpO2: 99%    General: Awake, no distress.  CV:  Good peripheral perfusion.  Regular rate and rhythm Resp:  Normal effort.  Clear to auscultation bilaterally Abd:  No distention.  Soft nontender Other:  Bilateral lower extremity  edema.  No signs of head trauma.  Normal mental status   ED Results / Procedures / Treatments   Labs (all labs ordered are listed, but only abnormal results are displayed) Labs Reviewed  CBC WITH DIFFERENTIAL/PLATELET - Abnormal; Notable for the following components:      Result Value   WBC 14.3 (*)    Hemoglobin 11.8 (*)    HCT 37.5 (*)    RDW 17.2 (*)    Neutro Abs 11.2 (*)    All other components within normal limits  COMPREHENSIVE METABOLIC PANEL - Abnormal; Notable for the following components:   Potassium 2.8 (*)    Calcium 7.7 (*)    Total Protein 5.3 (*)    Albumin 2.0 (*)    All other components within normal limits  MAGNESIUM  PHOSPHORUS  BRAIN NATRIURETIC PEPTIDE  URINALYSIS, COMPLETE (UACMP) WITH MICROSCOPIC  TROPONIN I (HIGH SENSITIVITY)     EKG Interpreted by me Atrial fibrillation, rate of 100.  Axis, normal intervals.  Normal QRS ST segments and T waves.  Diffuse low voltages.   RADIOLOGY Chest x-ray  interpreted by me, appears normal.  Radiology report reviewed.  CT head pending   PROCEDURES:  Procedures   MEDICATIONS ORDERED IN ED: Medications  potassium chloride 10 mEq in 100 mL IVPB (has no administration in time range)  potassium chloride SA (KLOR-CON M) CR tablet 40 mEq (has no administration in time range)  acetaminophen (TYLENOL) tablet 650 mg (has no administration in time range)  diphenhydrAMINE (BENADRYL) injection 12.5 mg (has no administration in time range)  midodrine (PROAMATINE) tablet 15 mg (15 mg Oral Given 08/17/22 1235)     IMPRESSION / MDM / ASSESSMENT AND PLAN / ED COURSE  I reviewed the triage vital signs and the nursing notes.  DDx: Dehydration, AKI, electrolyte abnormality, anemia, UTI, pneumonia, viral illness, orthostatic hypotension, intracranial hemorrhage  Patient's presentation is most consistent with acute presentation with potential threat to life or bodily function.  Patient presents with dizziness,  syncope, hypertension at home.  Vital signs in the ED are normal.  Serum labs unremarkable except for hypokalemia with a potassium of 2.8.  Will replace p.o. and IV.  Due to the patient's symptoms, case discussed with the hospitalist for further management.       FINAL CLINICAL IMPRESSION(S) / ED DIAGNOSES   Final diagnoses:  Hypotension, unspecified hypotension type  Hypokalemia  Syncope, unspecified syncope type     Rx / DC Orders   ED Discharge Orders     None        Note:  This document was prepared using Dragon voice recognition software and may include unintentional dictation errors.   Carrie Mew, MD 08/17/22 [id number]

## 2022-08-17 NOTE — H&P (Addendum)
History and Physical    Ernest [name] [id number] DOB: Mar 03, 1946 DOA: 08/17/2022  Referring MD/NP/PA:   PCP: Ernest Athens, MD   Patient coming from:  The patient is coming from home.       Chief Complaint: fall  HPI: Ernest [name] is a 77 y.o. male with medical history significant of A fib on Eliquis, orthostatic hypotension, syncope and fall, dCHF, on chronic 4L of O2, GERD, anemia, OSA (not able to tolerate BiPAP), who presents with fall.   Patient was recently hospitalized from 2/2 - 2/9 due to orthostatic hypotension and syncope.  After discharge, Midodrine was increased to 15 mg 3 times daily, Florinef increased to 20 mg daily and placed on abdominal binder and thigh-high compression stockings. Pt finished rehab and currently lives at home with his wife. He states that he still has dizziness and lightheadedness, particularly when standing up. Today he fell accidentally when he was attempting to sit down in a chair, but his legs "gave out. No loss of consciousness.  Denies significant injury.  Patient states that he has chronic diarrhea intermittently.  He is taking Bentyl as needed, currently no diarrhea.  Denies nausea, vomiting or abdominal pain.  Denies chest pain, no worsening shortness breath or coughing.  Denies symptoms of UTI.  No unilateral numbness or tinglings extremities.  Data reviewed independently and ED Course: pt was found to have WBC 14.3, GFR> 60, potassium 2.8, magnesium 1.7, phosphorus 3.8, temperature normal, soft blood pressure 94/67, heart rate 75, RR 18, oxygen saturation 99% on home level of 4 L oxygen.  Chest x-ray negative.  Patient is placed on telemetry bed for obs.  EKG: I have personally reviewed.  Atrial fibrillation, QTc 561, low voltage, artificial effects  Review of Systems:   General: no fevers, chills, no body weight gain, has fatigue HEENT: no blurry vision, hearing changes or sore throat Respiratory: no dyspnea, coughing,  wheezing CV: no chest pain, no palpitations GI: no nausea, vomiting, abdominal pain, has intermittent diarrhea, no constipation GU: no dysuria, burning on urination, increased urinary frequency, hematuria  Ext: Has leg edema Neuro: no unilateral weakness, numbness, or tingling, no vision change or hearing loss. Has fall. Skin: no rash, no skin tear. MSK: No muscle spasm, no deformity, no limitation of range of movement in spin Heme: No easy bruising.  Travel history: No recent long distant travel.   Allergy: No Known Allergies  Past Medical History:  Diagnosis Date   Anemia    Arthritis    osteoarthritis - bilateral knees   Chronic venous insufficiency of lower extremity    Diastolic dysfunction    a. 12/2020 Echo: EF 50-55%, GrI DD; b. 06/2022 Echo: EF 60-65%, no rwma, nl RV fxn, nl LA size, mildly dil RA.   Dyspnea    Essential hypertension    GERD (gastroesophageal reflux disease)    Hypotension    a. 06/2022 midodrine added.   Obesity hypoventilation syndrome (HCC)    Persistent atrial fibrillation ([place])    a. Dx 06/2022-->amio/eliquis. CHA2DS2VASc = 3.   Sleep apnea    Small bowel obstruction ([place]) 2022   Uses roller walker     Past Surgical History:  Procedure Laterality Date   CARDIOVERSION N/A 07/28/2022   Procedure: CARDIOVERSION;  Surgeon: Wellington Hampshire, MD;  Location: [facility];  Service: Cardiovascular;  Laterality: N/A;   CATARACT EXTRACTION W/PHACO Left 08/06/2020   Procedure: CATARACT EXTRACTION PHACO AND INTRAOCULAR LENS PLACEMENT ([place]) LEFT;  Surgeon: Edison Pace,  Josie Saunders, MD;  Location: [place];  Service: Ophthalmology;  Laterality: Left;  3.41 0:31.5   CATARACT EXTRACTION W/PHACO Right 08/27/2020   Procedure: CATARACT EXTRACTION PHACO AND INTRAOCULAR LENS PLACEMENT ([facility]) RIGHT 4.10 00:41.6;  Surgeon: Eulogio Bear, MD;  Location: [place];  Service: Ophthalmology;  Laterality: Right;   COLONOSCOPY WITH ESOPHAGOGASTRODUODENOSCOPY (EGD)      COLONOSCOPY WITH PROPOFOL N/A 11/01/2020   Procedure: COLONOSCOPY WITH PROPOFOL;  Surgeon: Lin Landsman, MD;  Location: [facility] ENDOSCOPY;  Service: Gastroenterology;  Laterality: N/A;   COLONOSCOPY WITH PROPOFOL N/A 03/18/2022   Procedure: COLONOSCOPY WITH BIOPSIES;  Surgeon: Lin Landsman, MD;  Location: [place];  Service: Endoscopy;  Laterality: N/A;   ESOPHAGOGASTRODUODENOSCOPY (EGD) WITH PROPOFOL N/A 11/01/2020   Procedure: ESOPHAGOGASTRODUODENOSCOPY (EGD) WITH PROPOFOL;  Surgeon: Lin Landsman, MD;  Location: [facility] ENDOSCOPY;  Service: Gastroenterology;  Laterality: N/A;   HERNIA REPAIR     inguinal and umbilical   POLYPECTOMY  03/18/2022   Procedure: POLYPECTOMY;  Surgeon: Lin Landsman, MD;  Location: [place];  Service: Endoscopy;;    Social History:  reports that he has never smoked. He has never used smokeless tobacco. He reports current alcohol use. He reports that he does not currently use drugs.  Family History:  Family History  Problem Relation Age of Onset   Esophageal cancer Father    Fibromyalgia Sister      Prior to Admission medications   Medication Sig Start Date End Date Taking? Authorizing Provider  acetaminophen (TYLENOL) 325 MG tablet Take 325 mg by mouth every 8 (eight) hours as needed for moderate pain. 09/01/19   [provider]  amiodarone (PACERONE) 200 MG tablet TAKE 1 TABLET BY MOUTH TWICE A DAY FOR 30 DAYS. THEN TAKE 1 TABLET EVERY DAY 07/18/22   Fenton, Clint R, PA  apixaban (ELIQUIS) 5 MG TABS tablet Take 1 tablet (5 mg total) by mouth 2 (two) times daily. 06/25/22   Croitoru, Mihai, MD  dicyclomine (BENTYL) 10 MG capsule Take 1 capsule (10 mg total) by mouth 3 (three) times daily as needed for spasms. 08/01/22   Sidney Ace, MD  fludrocortisone (FLORINEF) 0.1 MG tablet Take 2 tablets (0.2 mg total) by mouth daily. 08/02/22   Sidney Ace, MD  furosemide (LASIX) 40 MG tablet Take 1 tablet (40  mg total) by mouth daily as needed for fluid or edema. Take one tablet ('40mg'$ ) three times a week. 08/01/22   Sidney Ace, MD  loperamide (IMODIUM) 2 MG capsule Take 1 capsule (2 mg total) by mouth as needed for diarrhea or loose stools. 08/01/22   Sidney Ace, MD  midodrine (PROAMATINE) 10 MG tablet Take 1.5 tablets (15 mg total) by mouth 3 (three) times daily. 08/01/22   Sidney Ace, MD  Multiple Vitamins-Minerals (CENTRUM SILVER 50+MEN PO) Take 1 tablet by mouth in the morning.    [provider]  nystatin (MYCOSTATIN/NYSTOP) powder Apply 1 Application topically 3 (three) times daily. 02/25/22   Ernest Athens, MD  pantoprazole (PROTONIX) 40 MG tablet Take 1 tablet (40 mg total) by mouth daily. 01/20/22   Ernest Athens, MD  potassium chloride SA (KLOR-CON M) 20 MEQ tablet Take 1 tablet (20 mEq total) by mouth 3 (three) times a week. Patient taking differently: Take 20 mEq by mouth 3 (three) times daily. Gerre Scull, 8pm 07/18/22   Kate Sable, MD  Probiotic Product (PROBIOTIC DAILY PO) Take by mouth.    [provider]  traMADol (ULTRAM) 50 MG tablet Take 50 mg by mouth every 6 (six) hours as needed for moderate pain.    [provider]    Physical Exam: Vitals:   08/17/22 1151 08/17/22 1152 08/17/22 1330 08/17/22 1618  BP:  94/67 95/71 (!) 86/50  Pulse:  75 86 79  Resp:  '18 20 20  '$ Temp:  98.1 F (36.7 C)  98.1 F (36.7 C)  TempSrc:  Oral    SpO2:  99% 100% 99%  Weight: (!) 140.7 kg     Height: '6\' 1"'$  (1.854 m)      General: Not in acute distress HEENT:       Eyes: PERRL, EOMI, no scleral icterus.       ENT: No discharge from the ears and nose, no pharynx injection, no tonsillar enlargement.        Neck: No JVD, no bruit, no mass felt. Heme: No neck lymph node enlargement. Cardiac: S1/S2, RRR, No murmurs, No gallops or rubs. Respiratory: No rales, wheezing, rhonchi or rubs. GI: Soft, nondistended, nontender, no rebound pain, no  organomegaly, BS present. GU: No hematuria Ext: 2+ pitting leg edema bilaterally.  1+DP/PT pulse bilaterally. Musculoskeletal: No joint deformities, No joint redness or warmth, no limitation of ROM in spin. Skin: No rashes.  Neuro: Alert, oriented X3, cranial nerves [name]-XII grossly intact, moves all extremities normally.  Psych: Patient is not psychotic, no suicidal or hemocidal ideation.  Labs on Admission: I have personally reviewed following labs and imaging studies  CBC: Recent Labs  Lab 08/17/22 1208  WBC 14.3*  NEUTROABS 11.2*  HGB 11.8*  HCT 37.5*  MCV 85.4  PLT [id number]   Basic Metabolic Panel: Recent Labs  Lab 08/17/22 1208  NA 137  K 2.8*  CL 101  CO2 23  GLUCOSE 93  BUN 15  CREATININE 1.15  CALCIUM 7.7*  MG 1.7  PHOS 2.8   GFR: Estimated Creatinine Clearance: 80.5 mL/min (by C-G formula based on SCr of 1.15 mg/dL). Liver Function Tests: Recent Labs  Lab 08/17/22 1208  AST 31  ALT 16  ALKPHOS 88  BILITOT 0.8  PROT 5.3*  ALBUMIN 2.0*   No results for input(s): "LIPASE", "AMYLASE" in the last 168 hours. No results for input(s): "AMMONIA" in the last 168 hours. Coagulation Profile: No results for input(s): "INR", "PROTIME" in the last 168 hours. Cardiac Enzymes: No results for input(s): "CKTOTAL", "CKMB", "CKMBINDEX", "TROPONINI" in the last 168 hours. BNP (last 3 results) No results for input(s): "PROBNP" in the last 8760 hours. HbA1C: No results for input(s): "HGBA1C" in the last 72 hours. CBG: No results for input(s): "GLUCAP" in the last 168 hours. Lipid Profile: No results for input(s): "CHOL", "HDL", "LDLCALC", "TRIG", "CHOLHDL", "LDLDIRECT" in the last 72 hours. Thyroid Function Tests: No results for input(s): "TSH", "T4TOTAL", "FREET4", "T3FREE", "THYROIDAB" in the last 72 hours. Anemia Panel: No results for input(s): "VITAMINB12", "FOLATE", "FERRITIN", "TIBC", "IRON", "RETICCTPCT" in the last 72 hours. Urine analysis:    Component Value  Date/Time   COLORURINE YELLOW (A) 08/17/2022 1350   APPEARANCEUR HAZY (A) 08/17/2022 1350   LABSPEC 1.011 08/17/2022 1350   PHURINE 5.0 08/17/2022 1350   GLUCOSEU NEGATIVE 08/17/2022 1350   HGBUR NEGATIVE 08/17/2022 1350   BILIRUBINUR NEGATIVE 08/17/2022 1350   KETONESUR NEGATIVE 08/17/2022 1350   PROTEINUR NEGATIVE 08/17/2022 1350   NITRITE NEGATIVE 08/17/2022 1350   LEUKOCYTESUR NEGATIVE 08/17/2022 1350   Sepsis Labs: '@LABRCNTIP'$ (procalcitonin:4,lacticidven:4) )No results found for this or any  previous visit (from the past 240 hour(s)).   Radiological Exams on Admission: DG Chest Portable 1 View  Result Date: 08/17/2022 CLINICAL DATA:  Weakness, orthostatic hypertension EXAM: PORTABLE CHEST 1 VIEW COMPARISON:  Chest x-rays dated 07/26/2022 and 10/05/2020. FINDINGS: Stable cardiomegaly. Lungs are clear. No pleural effusion or pneumothorax is seen. Osseous structures about the chest are unremarkable. IMPRESSION: No active disease. No evidence of pneumonia or pulmonary edema. Stable cardiomegaly. Electronically Signed   By: Franki Cabot M.D.   On: 08/17/2022 12:51      Assessment/Plan Principal Problem:   Fall Active Problems:   Orthostatic hypotension   Hypokalemia   Persistent atrial fibrillation (HCC)   Leukocytosis   Obesity, Class III, BMI 40-49.9 (morbid obesity) (HCC)   Chronic respiratory failure with hypoxia (HCC)   OSA (obstructive sleep apnea)   Chronic diastolic CHF (congestive heart failure) (HCC)   Assessment and Plan:  Fall: Likely due to orthostatic hypotension.  No loss of consciousness.  No significant injury.  Patient is on Eliquis, will get CT of head  -Placed on head of bed for position -Fall precaution -PT/OT -Follow-up CT of head  Orthostatic hypotension -Check orthostatic vital signs -Continue midodrine 15 mg 3 times daily -Florinef 0.2 mg daily -compression TED hose -give 500 cc of NS bolus  Hypokalemia: Potassium 2.8, magnesium 1.7,  phosphorus 2.8 -Repleted potassium  Persistent atrial fibrillation ([place]): Heart rate 75.  Patient had cardioversion in the past.  He is in A-fib today.  Patient ask if we can consult cardiology for possible conversion again. -Continue Eliquis and amiodarone -message sent to Dr. Radford Pax of card for consult  Leukocytosis: WBC 14.3.  No source of infection identified.  No fever.  Chest x-ray negative.  Reactive. -Follow-up urinalysis  Obesity, Class III, BMI 40-49.9 (morbid obesity) ([place]): Body weight 140.7 kg, BMI 40.92 -Encouraged losing weight -Healthy diet and exercise  Chronic respiratory failure with hypoxia [facility]): Patient is on home level 4L of oxygen, with 99% saturation.  No worsening shortness breath. -Continue home level 4 L oxygen -As needed albuterol  OSA (obstructive sleep apnea) -Patient could not tolerate BiPAP -On nasal cannula oxygen  Chronic diastolic CHF (congestive heart failure) ([place]): 2D echo 07/08/2022 showed EF 60 to 65%.  Patient has 2+ leg edema, BNP is 138, chest x-ray negative for pulmonary edema.  Does not seem to have CHF exacerbation.  Blood pressures are soft, 94/67 -Hold Lasix -Watch volume status closely      DVT ppx: on Eliquis  Code Status:  DNR (I discussed with patient and explained the meaning of [place]. Patient wants to be DNR)  Family Communication:  Yes, patient's wife by phone   Disposition Plan:  Anticipate discharge back to previous environment  Consults called:  Dr. Radford Pax of card   Admission status and Level of care: Telemetry Medical:  for obs    Dispo: The patient is from: Home              Anticipated d/c is to:  to be determined              Anticipated d/c date is: 1 day              Patient currently is not medically stable to d/c.    Severity of Illness:  The appropriate patient status for this patient is OBSERVATION. Observation status is judged to be reasonable and necessary in order to provide the required  intensity of service to ensure the patient's  safety. The patient's presenting symptoms, physical exam findings, and initial radiographic and laboratory data in the context of their medical condition is felt to place them at decreased risk for further clinical deterioration. Furthermore, it is anticipated that the patient will be medically stable for discharge from the hospital within 2 midnights of admission.        Date of Service 08/17/2022    Ivor Costa [facility]   If 7PM-7AM, please contact night-coverage [email or web address] 08/17/2022, 6:01 PM

## 2022-08-17 NOTE — Progress Notes (Signed)
Patient arrived on unit, oriented to room and unit with reported understanding.  Patient reports regular medication times with specific diet including sodium-Dr Blaine Hamper notified.  Cardiac monitoring initiated prior to transport to CT in bed.

## 2022-08-17 NOTE — ED Notes (Signed)
MD at bedside. 

## 2022-08-17 NOTE — ED Notes (Signed)
Patient ate 95% lunch tray

## 2022-08-17 NOTE — ED Triage Notes (Signed)
Patient is here today after sustaining a mechanical fall at home; He has a history of unstable A-fib (recent cardioversion, takes Amiodarone) and orthostatic hypotension (takes Midodrine); No head injury, denies LOC, says he was attempting to sit down in a chair when his legs "gave out"; He does not have any home help except for his wife and states he needs more care than she can provide

## 2022-08-17 NOTE — ED Notes (Signed)
Advised nurse that patient has ready bed 

## 2022-08-17 NOTE — ED Notes (Signed)
Patient using RNs phone to call wife at this time

## 2022-08-18 ENCOUNTER — Telehealth: Payer: Self-pay | Admitting: Cardiology

## 2022-08-18 ENCOUNTER — Telehealth: Payer: Self-pay | Admitting: Pulmonary Disease

## 2022-08-18 ENCOUNTER — Ambulatory Visit: Payer: Medicare Other | Admitting: Pulmonary Disease

## 2022-08-18 DIAGNOSIS — M170 Bilateral primary osteoarthritis of knee: Secondary | ICD-10-CM | POA: Diagnosis not present

## 2022-08-18 DIAGNOSIS — Z79899 Other long term (current) drug therapy: Secondary | ICD-10-CM | POA: Diagnosis not present

## 2022-08-18 DIAGNOSIS — R296 Repeated falls: Secondary | ICD-10-CM | POA: Diagnosis present

## 2022-08-18 DIAGNOSIS — R69 Illness, unspecified: Secondary | ICD-10-CM | POA: Diagnosis not present

## 2022-08-18 DIAGNOSIS — G909 Disorder of the autonomic nervous system, unspecified: Secondary | ICD-10-CM | POA: Diagnosis present

## 2022-08-18 DIAGNOSIS — K580 Irritable bowel syndrome with diarrhea: Secondary | ICD-10-CM | POA: Diagnosis not present

## 2022-08-18 DIAGNOSIS — I5032 Chronic diastolic (congestive) heart failure: Secondary | ICD-10-CM | POA: Diagnosis not present

## 2022-08-18 DIAGNOSIS — Z7901 Long term (current) use of anticoagulants: Secondary | ICD-10-CM | POA: Diagnosis not present

## 2022-08-18 DIAGNOSIS — I471 Supraventricular tachycardia, unspecified: Secondary | ICD-10-CM | POA: Diagnosis not present

## 2022-08-18 DIAGNOSIS — E8809 Other disorders of plasma-protein metabolism, not elsewhere classified: Secondary | ICD-10-CM | POA: Diagnosis present

## 2022-08-18 DIAGNOSIS — I4819 Other persistent atrial fibrillation: Secondary | ICD-10-CM | POA: Diagnosis not present

## 2022-08-18 DIAGNOSIS — K219 Gastro-esophageal reflux disease without esophagitis: Secondary | ICD-10-CM | POA: Diagnosis not present

## 2022-08-18 DIAGNOSIS — W19XXXA Unspecified fall, initial encounter: Secondary | ICD-10-CM | POA: Diagnosis not present

## 2022-08-18 DIAGNOSIS — W1830XA Fall on same level, unspecified, initial encounter: Secondary | ICD-10-CM | POA: Diagnosis present

## 2022-08-18 DIAGNOSIS — I951 Orthostatic hypotension: Secondary | ICD-10-CM | POA: Diagnosis not present

## 2022-08-18 DIAGNOSIS — E538 Deficiency of other specified B group vitamins: Secondary | ICD-10-CM | POA: Diagnosis not present

## 2022-08-18 DIAGNOSIS — Z9981 Dependence on supplemental oxygen: Secondary | ICD-10-CM | POA: Diagnosis not present

## 2022-08-18 DIAGNOSIS — E876 Hypokalemia: Secondary | ICD-10-CM | POA: Diagnosis not present

## 2022-08-18 DIAGNOSIS — I872 Venous insufficiency (chronic) (peripheral): Secondary | ICD-10-CM | POA: Diagnosis not present

## 2022-08-18 DIAGNOSIS — Z66 Do not resuscitate: Secondary | ICD-10-CM | POA: Diagnosis present

## 2022-08-18 DIAGNOSIS — K529 Noninfective gastroenteritis and colitis, unspecified: Secondary | ICD-10-CM | POA: Diagnosis present

## 2022-08-18 DIAGNOSIS — G4733 Obstructive sleep apnea (adult) (pediatric): Secondary | ICD-10-CM

## 2022-08-18 DIAGNOSIS — I110 Hypertensive heart disease with heart failure: Secondary | ICD-10-CM | POA: Diagnosis not present

## 2022-08-18 DIAGNOSIS — M62838 Other muscle spasm: Secondary | ICD-10-CM | POA: Diagnosis not present

## 2022-08-18 DIAGNOSIS — D513 Other dietary vitamin B12 deficiency anemia: Secondary | ICD-10-CM | POA: Diagnosis not present

## 2022-08-18 DIAGNOSIS — R531 Weakness: Secondary | ICD-10-CM

## 2022-08-18 DIAGNOSIS — J9611 Chronic respiratory failure with hypoxia: Secondary | ICD-10-CM | POA: Diagnosis not present

## 2022-08-18 DIAGNOSIS — D649 Anemia, unspecified: Secondary | ICD-10-CM | POA: Diagnosis not present

## 2022-08-18 DIAGNOSIS — Z6841 Body Mass Index (BMI) 40.0 and over, adult: Secondary | ICD-10-CM | POA: Diagnosis not present

## 2022-08-18 DIAGNOSIS — Z743 Need for continuous supervision: Secondary | ICD-10-CM | POA: Diagnosis not present

## 2022-08-18 DIAGNOSIS — Y92003 Bedroom of unspecified non-institutional (private) residence as the place of occurrence of the external cause: Secondary | ICD-10-CM | POA: Diagnosis not present

## 2022-08-18 DIAGNOSIS — R55 Syncope and collapse: Secondary | ICD-10-CM | POA: Diagnosis not present

## 2022-08-18 DIAGNOSIS — Z800 Family history of malignant neoplasm of digestive organs: Secondary | ICD-10-CM | POA: Diagnosis not present

## 2022-08-18 DIAGNOSIS — F1090 Alcohol use, unspecified, uncomplicated: Secondary | ICD-10-CM | POA: Diagnosis not present

## 2022-08-18 LAB — CBC
HCT: 29.4 % — ABNORMAL LOW (ref 39.0–52.0)
Hemoglobin: 9.5 g/dL — ABNORMAL LOW (ref 13.0–17.0)
MCH: 27.1 pg (ref 26.0–34.0)
MCHC: 32.3 g/dL (ref 30.0–36.0)
MCV: 84 fL (ref 80.0–100.0)
Platelets: 212 10*3/uL (ref 150–400)
RBC: 3.5 MIL/uL — ABNORMAL LOW (ref 4.22–5.81)
RDW: 17.7 % — ABNORMAL HIGH (ref 11.5–15.5)
WBC: 6.4 10*3/uL (ref 4.0–10.5)
nRBC: 0 % (ref 0.0–0.2)

## 2022-08-18 LAB — BASIC METABOLIC PANEL
Anion gap: 5 (ref 5–15)
BUN: 15 mg/dL (ref 8–23)
CO2: 31 mmol/L (ref 22–32)
Calcium: 7.3 mg/dL — ABNORMAL LOW (ref 8.9–10.3)
Chloride: 104 mmol/L (ref 98–111)
Creatinine, Ser: 1.03 mg/dL (ref 0.61–1.24)
GFR, Estimated: >60 mL/min (ref >60)
Glucose, Bld: 87 mg/dL (ref 70–99)
Potassium: 3.7 mmol/L (ref 3.5–5.1)
Sodium: 140 mmol/L (ref 135–145)

## 2022-08-18 LAB — VASOACTIVE INTESTINAL PEPTIDE (VIP): Vasoactive Intest Polypeptide: 54 pg/mL (ref 0.0–58.8)

## 2022-08-18 MED ORDER — ALBUMIN HUMAN 25 % IV SOLN
25.0000 g | Freq: Once | INTRAVENOUS | Status: AC
  Administered 2022-08-18: 25 g via INTRAVENOUS
  Filled 2022-08-18: qty 100

## 2022-08-18 MED ORDER — AMIODARONE HCL 200 MG PO TABS
200.0000 mg | ORAL_TABLET | Freq: Every day | ORAL | Status: DC
  Administered 2022-08-19 – 2022-08-21 (×3): 200 mg via ORAL
  Filled 2022-08-18 (×3): qty 1

## 2022-08-18 MED ORDER — CYANOCOBALAMIN 1000 MCG/ML IJ SOLN
1000.0000 ug | Freq: Once | INTRAMUSCULAR | Status: AC
  Administered 2022-08-18: 1000 ug via INTRAMUSCULAR
  Filled 2022-08-18: qty 1

## 2022-08-18 MED ORDER — ALUM & MAG HYDROXIDE-SIMETH 200-200-20 MG/5ML PO SUSP: 30.0000 mL | Freq: Four times a day (QID) | ORAL | Status: DC | PRN

## 2022-08-18 MED ORDER — APIXABAN 5 MG PO TABS
5.0000 mg | ORAL_TABLET | Freq: Two times a day (BID) | ORAL | Status: DC
  Administered 2022-08-18 – 2022-08-21 (×7): 5 mg via ORAL
  Filled 2022-08-18 (×7): qty 1

## 2022-08-18 MED ORDER — PANTOPRAZOLE SODIUM 40 MG PO TBEC
40.0000 mg | DELAYED_RELEASE_TABLET | ORAL | Status: DC
  Administered 2022-08-20: 40 mg via ORAL
  Filled 2022-08-18: qty 1

## 2022-08-18 NOTE — Telephone Encounter (Signed)
Right now lets have him have his issues with blood pressure resolved.  The "room calls" actually would have to be done by specific consultation of the hospitalist team to our service.  There is always a provider in our service that covers inpatient consults.  We can certainly reschedule his appointment.

## 2022-08-18 NOTE — Telephone Encounter (Signed)
Please refer to 08/17/2022 mychart message.

## 2022-08-18 NOTE — Telephone Encounter (Signed)
Pt had to cancel upcoming visit due to being hospitalized, but is requesting Dr. Quentin Ore stop by and check on his at [address] [address].

## 2022-08-18 NOTE — Telephone Encounter (Signed)
PT calling to cancel appt due to hospitalization. Req that Dr. Patsey Berthold come to see him in room 109 after she gets off work. HE would like to speak to her.   The hospital is [facility]

## 2022-08-18 NOTE — Evaluation (Signed)
Occupational Therapy Evaluation Patient Details Name: Ernest [name] MRN: [id number] DOB: Nov 30, 1945 Today's Date: 08/18/2022   History of Present Illness Pt is a 77 y.o. male with medical history significant of A fib on Eliquis, orthostatic hypotension, syncope and fall, dCHF, respiratory failure with hypoxia on chronic 4L of O2, GERD, anemia, OSA (not able to tolerate BiPAP), who presents with fall.  MD assessment includes: fall, orthostatic hypotension, hypokalemia, and leukocytosis.   Clinical Impression   Mr View was seen for OT evaluation this date. Prior to hospital admission, pt was MOD I using RW. Pt lives with spouse. Pt presents to acute OT demonstrating impaired ADL performance and functional mobility 2/2 decreased activity tolerance and functional strength/ROM/balance deficits. Pt currently requires CGA + RW sit<>stand x5 from elevated bed height and ~3 ft side steps along EOB. Pt demonstrates fair insight into dizziness, plan to bring abdominal binder from home for next session. Pt would benefit from skilled OT to address noted impairments and functional limitations (see below for any additional details). Upon hospital discharge, recommend STR to maximize pt safety and return to PLOF.    Recommendations for follow up therapy are one component of a multi-disciplinary discharge planning process, led by the attending physician.  Recommendations may be updated based on patient status, additional functional criteria and insurance authorization.   Follow Up Recommendations  Skilled nursing-short term rehab (<3 hours/day)     Assistance Recommended at Discharge Intermittent Supervision/Assistance  Patient can return home with the following A lot of help with bathing/dressing/bathroom;Assistance with cooking/housework;Help with stairs or ramp for entrance;Assist for transportation;A little help with walking and/or transfers    Functional Status Assessment  Patient has had a recent  decline in their functional status and demonstrates the ability to make significant improvements in function in a reasonable and predictable amount of time.  Equipment Recommendations  BSC/3in1    Recommendations for Other Services       Precautions / Restrictions Precautions Precautions: Fall Restrictions Weight Bearing Restrictions: No Other Position/Activity Restrictions: Orthostatic hypotension      Mobility Bed Mobility Overal bed mobility: Modified Independent             General bed mobility comments: Extra time and effort only    Transfers Overall transfer level: Needs assistance Equipment used: Rolling walker (2 wheels) Transfers: Sit to/from Stand, Bed to chair/wheelchair/BSC Sit to Stand: Min guard, From elevated surface           General transfer comment: sit<>stand x5, side steps along EOB ~3 ft      Balance Overall balance assessment: Needs assistance, History of Falls Sitting-balance support: Feet supported Sitting balance-Leahy Scale: Good     Standing balance support: Bilateral upper extremity supported, During functional activity Standing balance-Leahy Scale: Fair                             ADL either performed or assessed with clinical judgement   ADL Overall ADL's : Needs assistance/impaired                                       General ADL Comments: CGA + BRW for simulated BSC t/f.      Pertinent Vitals/Pain Pain Assessment Pain Assessment: No/denies pain     Hand Dominance     Extremity/Trunk Assessment Upper Extremity Assessment Upper Extremity Assessment: Overall [facility]  for tasks assessed   Lower Extremity Assessment Lower Extremity Assessment: Generalized weakness       Communication Communication Communication: No difficulties   Cognition Arousal/Alertness: Awake/alert Behavior During Therapy: WFL for tasks assessed/performed Overall Cognitive Status: Within Functional Limits for tasks  assessed                                        Home Living Family/patient expects to be discharged to:: Private residence Living Arrangements: Spouse/significant other Available Help at Discharge: Family;Available 24 hours/day Type of Home: House Home Access: Stairs to enter [employer] of Steps: 7 (has a stair lift) but +1 step to get to lift   Home Layout: One level           Bathroom Accessibility: Yes   Home Equipment: Rollator (4 wheels);Cane - single point          Prior Functioning/Environment Prior Level of Function : History of Falls (last six months)             Mobility Comments: recently returned home from [employer] where pt was walking 168f          OT Problem List: Decreased strength;Cardiopulmonary status limiting activity;Decreased coordination;Decreased activity tolerance;Decreased safety awareness;Impaired balance (sitting and/or standing);Decreased knowledge of use of DME or AE      OT Treatment/Interventions: Self-care/ADL training;Therapeutic exercise;Therapeutic activities;DME and/or AE instruction;Patient/family education;Balance training;Energy conservation    OT Goals(Current goals can be found in the care plan section) Acute Rehab OT Goals Patient Stated Goal: to go home OT Goal Formulation: With patient/family Time For Goal Achievement: 09/01/22 Potential to Achieve Goals: Good ADL Goals Pt Will Perform Grooming: with min guard assist;standing Pt Will Perform Lower Body Dressing: with modified independence;sit to/from stand;with adaptive equipment Pt Will Transfer to Toilet: with modified independence;ambulating;bedside commode  OT Frequency: Min 2X/week    Co-evaluation              AM-PAC OT "6 Clicks" Daily Activity     Outcome Measure Help from another person eating meals?: None Help from another person taking care of personal grooming?: A Little Help from another person toileting,  which includes using toliet, bedpan, or urinal?: A Lot Help from another person bathing (including washing, rinsing, drying)?: A Lot Help from another person to put on and taking off regular upper body clothing?: A Little Help from another person to put on and taking off regular lower body clothing?: A Lot 6 Click Score: 16   End of Session    Activity Tolerance: Patient tolerated treatment well Patient left: in bed;with call bell/phone within reach;with family/visitor present  OT Visit Diagnosis: Other abnormalities of gait and mobility (R26.89);Muscle weakness (generalized) (M62.81)                Time: [id number] Time Calculation (min): 32 min Charges:  OT General Charges $OT Visit: 1 Visit OT Evaluation $OT Eval Moderate Complexity: 1 Mod OT Treatments $Therapeutic Activity: 8-22 mins  DDessie Coma M.S. OTR/L  08/18/22, 3:47 PM  ascom [phone]

## 2022-08-18 NOTE — Telephone Encounter (Signed)
Please see the MyChart message reply(ies) for my assessment and plan.    This patient gave consent for this Medical Advice Message and is aware that it may result in a bill to [employer], as well as the possibility of receiving a bill for a co-payment or deductible. They are an established patient, but are not seeking medical advice exclusively about a problem treated during an in person or video visit in the last seven days. I did not recommend an in person or video visit within seven days of my reply.    I spent a total of 12 minutes cumulative time within 7 days through [employer].  Kate Sable, MD

## 2022-08-18 NOTE — TOC Initial Note (Signed)
Transition of Care [facility]) - Initial/Assessment Note    Patient Details  Name: Ernest [name] MRN: [id number] Date of Birth: 1945/10/31  Transition of Care [facility]) CM/SW Contact:    Gerilyn Pilgrim, LCSW Phone Number: 08/18/2022, 2:30 PM  Clinical Narrative:    CSW spoke with pt regarding SNF recommendation. Pt reports he just discharged from [facility] two weeks ago and states the day he got home he collapsed. Pt reports he is unsure about going to rehab at this time and would like CSW to follow up with him tomorrow. CSW to follow up tomorrow.               Expected Discharge Plan: Skilled Nursing Facility Barriers to Discharge: Continued Medical Work up   Patient Goals and CMS Choice            Expected Discharge Plan and Services                                              Prior Living Arrangements/Services                       Activities of Daily Living      Permission Sought/Granted                  Emotional Assessment       Orientation: : Oriented to Self, Oriented to Place, Oriented to  Time, Oriented to Situation      Admission diagnosis:  Hypokalemia [E87.6] Fall [W19.XXXA] Hypotension, unspecified hypotension type [I95.9] Syncope, unspecified syncope type [R55] Orthostatic hypotension [I95.1] Patient Active Problem List   Diagnosis Date Noted   Fall 08/17/2022   Orthostatic hypotension 08/17/2022   Leukocytosis 08/17/2022   Chronic diastolic CHF (congestive heart failure) ([place]) 08/17/2022   Near syncope 07/26/2022   Diarrhea 07/26/2022   Chronic anticoagulation 07/26/2022   Chronic venous stasis 07/26/2022   Hypokalemia 07/26/2022   Hypotension 07/26/2022   Hypomagnesemia 07/26/2022   Persistent atrial fibrillation ([place]) 07/14/2022   Atrial fibrillation with rapid ventricular response ([place]) 06/27/2022   Hypercoagulable state due to persistent atrial fibrillation ([place]) 06/27/2022   Chronic right-sided heart  failure ([place]) 06/27/2022   Obesity hypoventilation syndrome ([place]) 06/27/2022   Essential hypertension 06/27/2022   Chronic respiratory failure with hypoxia ([place]) 05/22/2022   OSA (obstructive sleep apnea) 05/22/2022   History of colonic polyps    Polyp of transverse colon    Adenomatous polyp of cecum    Partial small bowel obstruction ([place]) 04/01/2021   Abdominal bloating 11/30/2020   Abdominal pain, epigastric    B12 deficiency anemia    GERD (gastroesophageal reflux disease)    Swollen lymph nodes 07/25/2020   SVT (supraventricular tachycardia) 06/25/2020   Obesity, Class III, BMI 40-49.9 (morbid obesity) ([place]) 06/25/2020   History of umbilical hernia repair [id number]   PCP:  [name], MD Pharmacy:   [id number] [id number]: [phone]: [phone]    Social Determinants of Health (SDOH) Social History: SDOH Screenings   Food Insecurity: No Food Insecurity (07/26/2022)  Housing: Low Risk  (07/26/2022)  Transportation Needs: No Transportation Needs (07/26/2022)  Utilities: Not At Risk (07/26/2022)  Alcohol Screen: Low Risk  (06/21/2021)  Depression (PHQ2-9): Low Risk  (06/21/2021)  Financial Resource Strain:  Low Risk  (06/21/2021)  Physical Activity: Inactive (06/21/2021)  Social Connections: Unknown (06/21/2021)  Stress: No Stress Concern Present (06/21/2021)  Tobacco Use: Low Risk  (08/17/2022)   SDOH Interventions:     Readmission Risk Interventions     No data to display

## 2022-08-18 NOTE — Evaluation (Signed)
Physical Therapy Evaluation Patient Details Name: Ernest [name] MRN: [id number] DOB: 11/27/1945 Today's Date: 08/18/2022  History of Present Illness  Pt is a 77 y.o. male with medical history significant of A fib on Eliquis, orthostatic hypotension, syncope and fall, dCHF, respiratory failure with hypoxia on chronic 4L of O2, GERD, anemia, OSA (not able to tolerate BiPAP), who presents with fall.  MD assessment includes: fall, orthostatic hypotension, hypokalemia, and leukocytosis.   Clinical Impression  Pt was pleasant and motivated to participate during the session and put forth good effort throughout. Pt's orthostatic vitals as follows: supine 91/60, HR 75; sitting 96/69, HR 90; standing 96/81, HR 90 with pt unable to complete entire standing BP in standing secondary to dizziness, last 10 sec of BP completed in sitting.  Pt required no physical assistance with bed mobility tasks but did require significantly increased time and effort to come to sitting.  Pt able to perform sit to stand and SPT with close CGA and with height of bed elevated and required heavy time and effort to complete tasks.  Amb attempt deferred secondary to dizziness in standing.  Pt is at a very high risk for falls and would not be safe to return to his prior living situation at this time.  Pt will benefit from PT services in a SNF setting upon discharge to safely address deficits listed in patient problem list for decreased caregiver assistance and eventual return to PLOF.         Recommendations for follow up therapy are one component of a multi-disciplinary discharge planning process, led by the attending physician.  Recommendations may be updated based on patient status, additional functional criteria and insurance authorization.  Follow Up Recommendations Skilled nursing-short term rehab (<3 hours/day) Can patient physically be transported by private vehicle: No    Assistance Recommended at Discharge Frequent or  constant Supervision/Assistance  Patient can return home with the following  A lot of help with bathing/dressing/bathroom;Assist for transportation;Help with stairs or ramp for entrance;Assistance with cooking/housework;Two people to help with walking and/or transfers    Equipment Recommendations Other (comment) (TBD)  Recommendations for Other Services       Functional Status Assessment Patient has had a recent decline in their functional status and demonstrates the ability to make significant improvements in function in a reasonable and predictable amount of time.     Precautions / Restrictions Precautions Precautions: Fall Restrictions Weight Bearing Restrictions: No Other Position/Activity Restrictions: Orthostatic hypotension      Mobility  Bed Mobility Overal bed mobility: Modified Independent             General bed mobility comments: Extra time and effort only    Transfers Overall transfer level: Needs assistance Equipment used: Rolling walker (2 wheels) Transfers: Sit to/from Stand, Bed to chair/wheelchair/BSC Sit to Stand: Min guard, From elevated surface   Step pivot transfers: From elevated surface, Min guard       General transfer comment: Min verbal cues for sequencing    Ambulation/Gait               General Gait Details: NT secondary to dizziness in standing  Stairs            Wheelchair Mobility    Modified Rankin (Stroke Patients Only)       Balance Overall balance assessment: Needs assistance, History of Falls Sitting-balance support: Feet supported Sitting balance-Leahy Scale: Good     Standing balance support: Bilateral upper extremity supported, During functional activity  Standing balance-Leahy Scale: Fair                               Pertinent Vitals/Pain Pain Assessment Pain Assessment: No/denies pain    Home Living Family/patient expects to be discharged to:: Private residence Living  Arrangements: Spouse/significant other Available Help at Discharge: Family;Available 24 hours/day Type of Home: House Home Access: Stairs to enter   [employer] of Steps: 7 (has a stair lift)   Home Layout: One level Home Equipment: Rollator (4 wheels);Cane - single point      Prior Function Prior Level of Function : History of Falls (last six months)             Mobility Comments: Mod Ind amb with a rollator household distances, 4 falls in the last 6 months.       Hand Dominance        Extremity/Trunk Assessment   Upper Extremity Assessment Upper Extremity Assessment: Generalized weakness    Lower Extremity Assessment Lower Extremity Assessment: Generalized weakness       Communication   Communication: No difficulties  Cognition Arousal/Alertness: Awake/alert Behavior During Therapy: WFL for tasks assessed/performed Overall Cognitive Status: Within Functional Limits for tasks assessed                                          General Comments      Exercises Total Joint Exercises Ankle Circles/Pumps: AROM, Strengthening, Both, 10 reps, 15 reps Quad Sets: Strengthening, Both, 10 reps, 5 reps Gluteal Sets: Strengthening, Both, 10 reps, 15 reps Long Arc Quad: Strengthening, Both, 10 reps, 15 reps Knee Flexion: Strengthening, Both, 10 reps, 15 reps   Assessment/Plan    PT Assessment Patient needs continued PT services  PT Problem List Decreased strength;Decreased activity tolerance;Decreased balance;Decreased mobility       PT Treatment Interventions DME instruction;Gait training;Functional mobility training;Therapeutic activities;Therapeutic exercise;Balance training;Patient/family education    PT Goals (Current goals can be found in the Care Plan section)  Acute Rehab PT Goals Patient Stated Goal: To return home PT Goal Formulation: With patient Time For Goal Achievement: 08/31/22 Potential to Achieve Goals: Fair     Frequency Min 2X/week     Co-evaluation               AM-PAC PT "6 Clicks" Mobility  Outcome Measure Help needed turning from your back to your side while in a flat bed without using bedrails?: A Little Help needed moving from lying on your back to sitting on the side of a flat bed without using bedrails?: A Little Help needed moving to and from a bed to a chair (including a wheelchair)?: A Little Help needed standing up from a chair using your arms (e.g., wheelchair or bedside chair)?: A Little Help needed to walk in hospital room?: A Lot Help needed climbing 3-5 steps with a railing? : Total 6 Click Score: 15    End of Session Equipment Utilized During Treatment: Gait belt;Oxygen Activity Tolerance: Other (comment) (limited by dizziness in standing) Patient left: Other (comment) (Pt left on BSC with CNA at end of session) Nurse Communication: Mobility status PT Visit Diagnosis: Unsteadiness on feet (R26.81);Muscle weakness (generalized) (M62.81);Difficulty in walking, not elsewhere classified (R26.2);History of falling (Z91.81)    Time: [id number] (and [phone] with break between for medications) PT Time Calculation (min) (ACUTE  ONLY): 13 min   Charges:   PT Evaluation $PT Eval Moderate Complexity: 1 Mod PT Treatments $Therapeutic Exercise: 8-22 mins       D. Scott Darrnell Mangiaracina PT, DPT 08/18/22, 11:06 AM

## 2022-08-18 NOTE — Hospital Course (Signed)
Ernest [name] is a 77 y.o. male with medical history significant of A fib on Eliquis, orthostatic hypotension, syncope and fall, dCHF, on chronic 4L of O2, GERD, anemia, OSA (not able to tolerate BiPAP), who presents with fall.   Significant hypotension, received albumin.  Seen by cardiology, appeared to have a chronic hypotension, no additional treatment is available. Patient is pending nursing placement.

## 2022-08-18 NOTE — Consult Note (Signed)
Cardiology Consultation:   Patient ID: PRINCE CASTIGLIONI [id number]; [birth year]   Admit date: 08/17/2022 Date of Consult: 08/18/2022  Primary Care Provider: [name], MD Primary Cardiologist: Garen Lah Primary Electrophysiologist:  Quentin Ore   Patient Profile:   Ernest [name] is a 77 y.o. male with a hx of persistent A-fib, SVT versus junctional tachycardia, orthostatic hypotension, venous insufficiency, partial SBO, morbid obesity with untreated OSA and OHS on home oxygen, and intermittent chronic diarrhea who is being seen today for the evaluation of A-fib at the request of Dr. Roosevelt Locks.  History of Present Illness:   Ernest [name] was recently evaluated for concern of SVT noted on a 06/2020 EKG.  This was felt to most likely represent junctional tachycardia.  However, in the office in 06/2022, he was noted to be in asymptomatic A-fib with RVR with a rate of 138 bpm.  He was hypotensive with a BP of 84/54.  His amiloride-HCTZ was d/c'd and he was placed on digoxin and eliquis. Echo 1/16 showed EF of 60-65% w/o rwma, nl RV fxn, and mildly dil RA. Lasix '40mg'$  three x/wk was started on 1/20. He was seen in afib clinic on 1/22 and rates remained elevated. Digoxin was d/c'd and he was placed on amio '200mg'$  bid. Midodrine '10mg'$  BID was added in the setting of ongoing hypotension. He then established care w/ Dr. Garen Lah on 1/26. BP remained soft in the 80's and midodrine was increased to '10mg'$  TID w/plan for DCCV.  Since being placed on midodrine and amiodarone, he noted intermittent diarrhea.  He was recently admitted to the hospital earlier this month with orthostasis and presyncope in the setting of watery diarrhea.  High-sensitivity opponent was normal.  He remained in A-fib and underwent successful DCCV.  At time of discharge, it was recommended the patient take midodrine 15 mg 3 times daily, fludrocortisone 0.2 mg daily, and utilize thigh-high compression socks with abdominal binder.  He was  discharged home to rehab.  Upon returning home from rehab, the patient's wife indicates it was nearly impossible to get the patient back inside his house secondary to ongoing weakness.  The patient had gotten up to go use the restroom with associated lower extremity weakness.  He had a controlled fall to the ground and did not hit his head or suffer LOC.  He was wearing knee-high compression socks without abdominal binder during this episode.  No chest pain, dyspnea, or palpitations.  Due to persistent weakness, he was admitted to the hospital.  Admission EKG is uninterpretable.  BP has ranged from the 0000000 to [id number] systolic.  High-sensitivity troponin negative.  BNP 138.  Chest x-ray with stable cardiomegaly without acute cardiopulmonary process.  CT head showed no acute intracranial abnormality.  Labs notable for a potassium of 2.8 trending to 3.7, calcium 7.7, albumin 2.0.  Patient currently maintaining sinus rhythm on telemetry.  Orthostatic vital signs: Supine: 91/60, 75 bpm Sitting: 96/69, 90 bpm Standing: 96/81, 90 bpm Standing time 3 minutes: Patient unable to stand secondary to dizziness  Past Medical History:  Diagnosis Date   Anemia    Arthritis    osteoarthritis - bilateral knees   Chronic venous insufficiency of lower extremity    Diastolic dysfunction    a. 12/2020 Echo: EF 50-55%, GrI DD; b. 06/2022 Echo: EF 60-65%, no rwma, nl RV fxn, nl LA size, mildly dil RA.   Dyspnea    Essential hypertension    GERD (gastroesophageal reflux disease)    Hypotension  a. 06/2022 midodrine added.   Obesity hypoventilation syndrome (HCC)    Persistent atrial fibrillation ([place])    a. Dx 06/2022-->amio/eliquis. CHA2DS2VASc = 3.   Sleep apnea    Small bowel obstruction ([place]) 2022   Uses roller walker     Past Surgical History:  Procedure Laterality Date   CARDIOVERSION N/A 07/28/2022   Procedure: CARDIOVERSION;  Surgeon: Wellington Hampshire, MD;  Location: [facility];  Service: Cardiovascular;   Laterality: N/A;   CATARACT EXTRACTION W/PHACO Left 08/06/2020   Procedure: CATARACT EXTRACTION PHACO AND INTRAOCULAR LENS PLACEMENT ([place]) LEFT;  Surgeon: Eulogio Bear, MD;  Location: [place];  Service: Ophthalmology;  Laterality: Left;  3.41 0:31.5   CATARACT EXTRACTION W/PHACO Right 08/27/2020   Procedure: CATARACT EXTRACTION PHACO AND INTRAOCULAR LENS PLACEMENT ([facility]) RIGHT 4.10 00:41.6;  Surgeon: Eulogio Bear, MD;  Location: [place];  Service: Ophthalmology;  Laterality: Right;   COLONOSCOPY WITH ESOPHAGOGASTRODUODENOSCOPY (EGD)     COLONOSCOPY WITH PROPOFOL N/A 11/01/2020   Procedure: COLONOSCOPY WITH PROPOFOL;  Surgeon: Lin Landsman, MD;  Location: [facility] ENDOSCOPY;  Service: Gastroenterology;  Laterality: N/A;   COLONOSCOPY WITH PROPOFOL N/A 03/18/2022   Procedure: COLONOSCOPY WITH BIOPSIES;  Surgeon: Lin Landsman, MD;  Location: [place];  Service: Endoscopy;  Laterality: N/A;   ESOPHAGOGASTRODUODENOSCOPY (EGD) WITH PROPOFOL N/A 11/01/2020   Procedure: ESOPHAGOGASTRODUODENOSCOPY (EGD) WITH PROPOFOL;  Surgeon: Lin Landsman, MD;  Location[facility] END[facility];  Service: Gastroenterology;  Laterality: N/A;   HERNIA REPAIR     inguinal and umbilical   POLYPECTOMY  03/18/2022   Procedure: POLYPECTOMY;  Surgeon: Lin Landsman, MD;  Location: [place];  Service: Endoscopy;;     Home Meds: Prior to Admission medications   Medication Sig Start Date End Date Taking? Authorizing Provider  acetaminophen (TYLENOL) 325 MG tablet Take 325 mg by mouth every 8 (eight) hours as needed for moderate pain. 09/01/19  Yes [provider]  amiodarone (PACERONE) 200 MG tablet TAKE 1 TABLET BY MOUTH TWICE A DAY FOR 30 DAYS. THEN TAKE 1 TABLET EVERY DAY 07/18/22  Yes Fenton, Clint R, PA  apixaban (ELIQUIS) 5 MG TABS tablet Take 1 tablet (5 mg total) by mouth 2 (two) times daily. 06/25/22  Yes Croitoru, Mihai, MD  dicyclomine (BENTYL) 10  MG capsule Take 1 capsule (10 mg total) by mouth 3 (three) times daily as needed for spasms. 08/01/22  Yes Sreenath, Sudheer B, MD  fludrocortisone (FLORINEF) 0.1 MG tablet Take 2 tablets (0.2 mg total) by mouth daily. 08/02/22  Yes Sreenath, Sudheer B, MD  loperamide (IMODIUM) 2 MG capsule Take 1 capsule (2 mg total) by mouth as needed for diarrhea or loose stools. 08/01/22  Yes Sreenath, Sudheer B, MD  midodrine (PROAMATINE) 10 MG tablet Take 1.5 tablets (15 mg total) by mouth 3 (three) times daily. 08/01/22  Yes Sreenath, Sudheer B, MD  Multiple Vitamins-Minerals (CENTRUM SILVER 50+MEN PO) Take 1 tablet by mouth in the morning.   Yes [provider]  nystatin (MYCOSTATIN/NYSTOP) powder Apply 1 Application topically 3 (three) times daily. 02/25/22  Yes Masoud, Viann Shove, MD  furosemide (LASIX) 40 MG tablet Take 1 tablet (40 mg total) by mouth daily as needed for fluid or edema. Take one tablet ('40mg'$ ) three times a week. Patient not taking: Reported on 08/17/2022 08/01/22   Sidney Ace, MD  pantoprazole (PROTONIX) 40 MG tablet Take 1 tablet (40 mg total) by mouth daily. 01/20/22   [name], MD  potassium  chloride SA (KLOR-CON M) 20 MEQ tablet Take 1 tablet (20 mEq total) by mouth 3 (three) times a week. Patient taking differently: Take 20 mEq by mouth 3 (three) times a week. Gerre Scull, 8pm 07/18/22   Kate Sable, MD  Probiotic Product (PROBIOTIC DAILY PO) Take by mouth.    [provider]  traMADol (ULTRAM) 50 MG tablet Take 50 mg by mouth every 6 (six) hours as needed for moderate pain.    [provider]    Inpatient Medications: Scheduled Meds:  amiodarone  200 mg Oral BID   apixaban  5 mg Oral BID   fludrocortisone  0.2 mg Oral Daily   midodrine  15 mg Oral TID AC   multivitamin with minerals  1 tablet Oral Daily   pantoprazole  40 mg Oral Daily   Continuous Infusions:  PRN Meds: acetaminophen, albuterol, dicyclomine, diphenhydrAMINE, loperamide,  traMADol  Allergies:  No Known Allergies  Social History:   Social History   Socioeconomic History   Marital status: Married    Spouse name: Not on file   Number of children: Not on file   Years of education: Not on file   Highest education level: Not on file  Occupational History   Not on file  Tobacco Use   Smoking status: Never   Smokeless tobacco: Never   Tobacco comments:    Never smoke 07/14/22  Substance and Sexual Activity   Alcohol use: Yes    Alcohol/week: 0.0 standard drinks of alcohol    Comment: rarely   Drug use: Not Currently   Sexual activity: Not on file  Other Topics Concern   Not on file  Social History Narrative   Lives locally with wife.  Does not routinely exercise.   Social Determinants of Health   Financial Resource Strain: Low Risk  (06/21/2021)   Overall Financial Resource Strain (CARDIA)    Difficulty of Paying Living Expenses: Not hard at all  Food Insecurity: No Food Insecurity (07/26/2022)   Hunger Vital Sign    Worried About Running Out of Food in the Last Year: Never true    Ran Out of Food in the Last Year: Never true  Transportation Needs: No Transportation Needs (07/26/2022)   PRAPARE - Hydrologist (Medical): No    Lack of Transportation (Non-Medical): No  Physical Activity: Inactive (06/21/2021)   Exercise Vital Sign    Days of Exercise per Week: 0 days    Minutes of Exercise per Session: 0 min  Stress: No Stress Concern Present (06/21/2021)   [place]    Feeling of Stress : Not at all  Social Connections: Unknown (06/21/2021)   Social Connection and Isolation Panel [NHANES]    Frequency of Communication with Friends and Family: Twice a week    Frequency of Social Gatherings with Friends and Family: Never    Attends Religious Services: Patient refused    Marine scientist or Organizations: Patient refused    Attends Theatre manager Meetings: Patient refused    Marital Status: Married  Human resources officer Violence: Not At Risk (07/26/2022)   Humiliation, Afraid, Rape, and Kick questionnaire    Fear of Current or Ex-Partner: No    Emotionally Abused: No    Physically Abused: No    Sexually Abused: No     Family History:   Family History  Problem Relation Age of Onset   Esophageal cancer Father  Fibromyalgia Sister     ROS:  Review of Systems  Constitutional:  Positive for malaise/fatigue. Negative for chills, diaphoresis, fever and weight loss.  HENT:  Negative for congestion.   Eyes:  Negative for discharge and redness.  Respiratory:  Negative for cough, sputum production, shortness of breath and wheezing.   Cardiovascular:  Negative for chest pain, palpitations, orthopnea, claudication, leg swelling and PND.  Gastrointestinal:  Negative for abdominal pain, heartburn, nausea and vomiting.  Musculoskeletal:  Positive for falls. Negative for myalgias.  Skin:  Negative for rash.  Neurological:  Positive for dizziness and weakness. Negative for tingling, tremors, sensory change, speech change, focal weakness and loss of consciousness.       Lower extremity weakness  Endo/Heme/Allergies:  Does not bruise/bleed easily.  Psychiatric/Behavioral:  Negative for substance abuse. The patient is not nervous/anxious.   All other systems reviewed and are negative.     Physical Exam/Data:   Vitals:   08/18/22 0557 08/18/22 0828 08/18/22 0852 08/18/22 1213  BP: (!) 88/61 (!) 91/57  97/69  Pulse: 70 (!) 48 68 69  Resp: '19 20  18  '$ Temp: 97.6 F (36.4 C) 97.8 F (36.6 C)  97.9 F (36.6 C)  TempSrc: Oral Oral    SpO2: 100% 100%  100%  Weight:      Height:        Intake/Output Summary (Last 24 hours) at 08/18/2022 1336 Last data filed at 08/17/2022 2100 Gross per 24 hour  Intake --  Output 500 ml  Net -500 ml   Filed Weights   08/17/22 1151 08/18/22 0230  Weight: (!) 140.7 kg (!) 145.3 kg   Body  mass index is 42.26 kg/m.   Physical Exam: General: Well developed, well nourished, in no acute distress. Head: Normocephalic, atraumatic, sclera non-icteric, no xanthomas, nares without discharge.  Neck: Negative for carotid bruits. JVD not elevated. Lungs: Clear bilaterally to auscultation without wheezes, rales, or rhonchi. Breathing is unlabored. Heart: RRR with S1 S2. No murmurs, rubs, or gallops appreciated. Abdomen: Soft, non-tender, non-distended with normoactive bowel sounds. No hepatomegaly. No rebound/guarding. No obvious abdominal masses. Msk:  Strength and tone appear normal for age. Extremities: No clubbing or cyanosis. No edema. Distal pedal pulses are 2+ and equal bilaterally. Neuro: Alert and oriented X 3. No facial asymmetry. No focal deficit. Moves all extremities spontaneously. Psych:  Responds to questions appropriately with a normal affect.   EKG:  The EKG was personally reviewed and demonstrates: Uninterpretable with significant baseline artifact Telemetry:  Telemetry was personally reviewed and demonstrates: Sinus rhythm with PACs  Weights: Filed Weights   08/17/22 1151 08/18/22 0230  Weight: (!) 140.7 kg (!) 145.3 kg    Relevant CV Studies:  2D echo 07/08/2022: 1. Challenging images.   2. Left ventricular ejection fraction, by estimation, is 60 to 65%. The  left ventricle has normal function. The left ventricle has no regional  wall motion abnormalities. Left ventricular diastolic parameters are  indeterminate.   3. Right ventricular systolic function is normal. The right ventricular  size is mildly enlarged.   4. The mitral valve is normal in structure. No evidence of mitral valve  regurgitation. No evidence of mitral stenosis.   5. The aortic valve was not well visualized. Aortic valve regurgitation  is not visualized. No aortic stenosis is present.   6. The inferior vena cava is normal in size with greater than 50%  respiratory variability,  suggesting right atrial pressure of 3 mmHg.   7. Right  atrial size was mildly dilated.   8. Unable to exclude abnormal rhythm, atrial fibrillation.   Comparison(s): 01/10/21 EF 50-55%.   Laboratory Data:  Chemistry Recent Labs  Lab 08/17/22 1208 08/18/22 0522  NA 137 140  K 2.8* 3.7  CL 101 104  CO2 23 31  GLUCOSE 93 87  BUN 15 15  CREATININE 1.15 1.03  CALCIUM 7.7* 7.3*  GFRNONAA >60 >60  ANIONGAP 13 5    Recent Labs  Lab 08/17/22 1208  PROT 5.3*  ALBUMIN 2.0*  AST 31  ALT 16  ALKPHOS 88  BILITOT 0.8   Hematology Recent Labs  Lab 08/17/22 1208 08/18/22 0522  WBC 14.3* 6.4  RBC 4.39 3.50*  HGB 11.8* 9.5*  HCT 37.5* 29.4*  MCV 85.4 84.0  MCH 26.9 27.1  MCHC 31.5 32.3  RDW 17.2* 17.7*  PLT 290 212   Cardiac EnzymesNo results for input(s): "TROPONINI" in the last 168 hours. No results for input(s): "TROPIPOC" in the last 168 hours.  BNP Recent Labs  Lab 08/17/22 1208  BNP 138.3*    DDimer No results for input(s): "DDIMER" in the last 168 hours.  Radiology/Studies:  CT HEAD WO CONTRAST (5MM)  Result Date: 08/17/2022 IMPRESSION: No acute intracranial abnormality. Electronically Signed   By: Ulyses Jarred M.D.   On: 08/17/2022 19:05   DG Chest Portable 1 View  Result Date: 08/17/2022 IMPRESSION: No active disease. No evidence of pneumonia or pulmonary edema. Stable cardiomegaly. Electronically Signed   By: Franki Cabot M.D.   On: 08/17/2022 12:51    Assessment and Plan:   1.  Persistent A-fib: -Currently in sinus rhythm on telemetry -Admission EKG is not interpretable, have placed order for repeat EKG -No indication for DCCV at this time -Continue amiodarone 200 mg twice daily -Continue apixaban 5 mg twice daily  2.  Orthostatic hypotension: -Orthostatic vital signs negative this morning -PTA midodrine and fludrocortisone -Continue with thigh-high compression socks and abdominal binder. -Unclear if he would benefit from Northera  3.   Weakness: -Likely multifactorial including orthostatic hypotension, physical deconditioning, untreated sleep apnea with OHS, intermittent volume loss through diarrhea, and hypoalbuminemia -Will need PT/OT -Ongoing management per IM  4.  Hypoalbuminemia: -Contributing to overall presentation -Receiving repletion via IV  5.  Hypokalemia: -Repleted  6.  HFpEF: -Volume status is difficult to assess on physical exam secondary to body habitus -Chest x-ray without acute cardiopulmonary process -BNP 138 -Will need to monitor fluid status on fludrocortisone  7.  Morbid obesity with untreated sleep apnea and OHS: -Would benefit from sleep evaluation as outpatient -Intolerant to BiPAP -Remains on supplemental oxygen    For questions or updates, please contact [place] Please consult [email or web address] for contact info under Cardiology/STEMI.   Signed, Christell Faith, PA-C [facility] Pager: [phone] 08/18/2022, 1:36 PM

## 2022-08-18 NOTE — Progress Notes (Signed)
Progress Note   Patient: Ernest [name] [id number] DOB: 18-Aug-1945 DOA: 08/17/2022     0 DOS: the patient was seen and examined on 08/18/2022   Brief hospital course: Ernest [name] is a 77 y.o. male with medical history significant of A fib on Eliquis, orthostatic hypotension, syncope and fall, dCHF, on chronic 4L of O2, GERD, anemia, OSA (not able to tolerate BiPAP), who presents with fall.     Principal Problem:   Fall Active Problems:   Hypotension   Orthostatic hypotension   Hypokalemia   Persistent atrial fibrillation (HCC)   Leukocytosis   Obesity, Class III, BMI 40-49.9 (morbid obesity) (HCC)   B12 deficiency anemia   Chronic respiratory failure with hypoxia (HCC)   OSA (obstructive sleep apnea)   Chronic diastolic CHF (congestive heart failure) (HCC)   Assessment and Plan: Fall at home. Orthostatic hypotension. Patient did not loss consciousness at home, no syncope.  However, he was dizzy with low blood pressure.  Currently he is taking midodrine 15 mg 3 times a day, Florinef 0.2 mg daily.  Patient still feels dizzy today. Reviewed echocardiogram performed in January 2024, ejection fraction 60 to 65% without valvular abnormality. Cortisol level performed on 2/5 was normal at 11.6. Hypotension most likely due to autonomic dysfunction. Patient albumin was low at 2.0, will give 25 g albumin. Patient has been elevated by PT/OT, recommended nursing home placement.  Patient preferred to go home, will continue PT/OT in the hospital to make sure patient is safe to go home with PT.  Vitamin B12 deficient anemia. Small intestine bacterial overgrowth. Patient hemoglobin in the low, B12 on 2/7 was 140.  Will give B12 injection while in the hospital.  Switch to oral B12 at time of discharge.  Patient may have absorption small intestine bacterial overgrowth, will need to follow-up with PCP to determine if oral B12 is sufficient.  Persistent atrial fibrillation. Continue  anticoagulation.  Heart rate under control.  Hypokalemia. Received IV potassium, level has normalized.  Recheck level tomorrow.  Chronic diastolic congestive heart failure.   Chronic respiratory failure with hypoxemia on 4 L oxygen. Morbid obesity. Obstructive sleep apnea. Condition was stable.  Lasix on hold due to hypotension.     Subjective:  Patient still feels dizzy, lightheaded.  Does not feel short of breath, still on 4 L oxygen which is baseline.  Physical Exam: Vitals:   08/18/22 0557 08/18/22 0828 08/18/22 0852 08/18/22 1213  BP: (!) 88/61 (!) 91/57  97/69  Pulse: 70 (!) 48 68 69  Resp: '19 20  18  '$ Temp: 97.6 F (36.4 C) 97.8 F (36.6 C)  97.9 F (36.6 C)  TempSrc: Oral Oral    SpO2: 100% 100%  100%  Weight:      Height:       General exam: Appears calm and comfortable, morbidly obese. Respiratory system: Clear to auscultation. Respiratory effort normal. Cardiovascular system: Irregular. No JVD, murmurs, rubs, gallops or clicks.  Gastrointestinal system: Abdomen is nondistended, soft and nontender. No organomegaly or masses felt. Normal bowel sounds heard. Central nervous system: Alert and oriented. No focal neurological deficits. Extremities: 1+ leg edema Skin: No rashes, lesions or ulcers Psychiatry: Judgement and insight appear normal. Mood & affect appropriate.    Data Reviewed:  Reviewed prior lab results; lab results, echocardiogram results, colonoscopy results, Current CT head and chest x-ray results. Family Communication: Wife updated.  Disposition: Status is: Observation      Time spent: 50 minutes  Author:  Sharen Hones, MD 08/18/2022 12:44 PM  For on call review [email or web address].

## 2022-08-19 DIAGNOSIS — I5032 Chronic diastolic (congestive) heart failure: Secondary | ICD-10-CM | POA: Diagnosis not present

## 2022-08-19 DIAGNOSIS — D513 Other dietary vitamin B12 deficiency anemia: Secondary | ICD-10-CM | POA: Diagnosis not present

## 2022-08-19 DIAGNOSIS — I951 Orthostatic hypotension: Secondary | ICD-10-CM | POA: Diagnosis not present

## 2022-08-19 LAB — CBC
HCT: 28.9 % — ABNORMAL LOW (ref 39.0–52.0)
Hemoglobin: 9.4 g/dL — ABNORMAL LOW (ref 13.0–17.0)
MCH: 27.3 pg (ref 26.0–34.0)
MCHC: 32.5 g/dL (ref 30.0–36.0)
MCV: 84 fL (ref 80.0–100.0)
Platelets: 190 10*3/uL (ref 150–400)
RBC: 3.44 MIL/uL — ABNORMAL LOW (ref 4.22–5.81)
RDW: 17.2 % — ABNORMAL HIGH (ref 11.5–15.5)
WBC: 7.1 10*3/uL (ref 4.0–10.5)
nRBC: 0 % (ref 0.0–0.2)

## 2022-08-19 LAB — BASIC METABOLIC PANEL
Anion gap: 4 — ABNORMAL LOW (ref 5–15)
BUN: 14 mg/dL (ref 8–23)
CO2: 32 mmol/L (ref 22–32)
Calcium: 7 mg/dL — ABNORMAL LOW (ref 8.9–10.3)
Chloride: 103 mmol/L (ref 98–111)
Creatinine, Ser: 0.84 mg/dL (ref 0.61–1.24)
GFR, Estimated: >60 mL/min (ref >60)
Glucose, Bld: 88 mg/dL (ref 70–99)
Potassium: 2.7 mmol/L — CL (ref 3.5–5.1)
Sodium: 139 mmol/L (ref 135–145)

## 2022-08-19 LAB — PHOSPHORUS: Phosphorus: 3.6 mg/dL (ref 2.5–4.6)

## 2022-08-19 LAB — MAGNESIUM: Magnesium: 1.6 mg/dL — ABNORMAL LOW (ref 1.7–2.4)

## 2022-08-19 MED ORDER — POTASSIUM CHLORIDE CRYS ER 20 MEQ PO TBCR
40.0000 meq | EXTENDED_RELEASE_TABLET | ORAL | Status: AC
  Administered 2022-08-19 (×3): 40 meq via ORAL
  Filled 2022-08-19 (×3): qty 2

## 2022-08-19 MED ORDER — CYANOCOBALAMIN 1000 MCG/ML IJ SOLN
1000.0000 ug | Freq: Every day | INTRAMUSCULAR | Status: AC
  Administered 2022-08-19 – 2022-08-21 (×3): 1000 ug via INTRAMUSCULAR
  Filled 2022-08-19 (×3): qty 1

## 2022-08-19 MED ORDER — POTASSIUM CHLORIDE 10 MEQ/100ML IV SOLN
10.0000 meq | INTRAVENOUS | Status: AC
  Administered 2022-08-19 (×3): 10 meq via INTRAVENOUS
  Filled 2022-08-19 (×3): qty 100

## 2022-08-19 MED ORDER — POTASSIUM CHLORIDE CRYS ER 20 MEQ PO TBCR
40.0000 meq | EXTENDED_RELEASE_TABLET | Freq: Four times a day (QID) | ORAL | Status: DC
  Administered 2022-08-19: 40 meq via ORAL
  Filled 2022-08-19: qty 2

## 2022-08-19 MED ORDER — MAGNESIUM SULFATE 2 GM/50ML IV SOLN
2.0000 g | Freq: Once | INTRAVENOUS | Status: AC
  Administered 2022-08-19: 2 g via INTRAVENOUS
  Filled 2022-08-19: qty 50

## 2022-08-19 NOTE — Progress Notes (Addendum)
Progress Note  Patient Name: Ernest [name] Date of Encounter: 08/19/2022  Primary Cardiologist: Garen Lah Primary Electrophysiologist:  Quentin Ore  Subjective   No symptoms of angina or cardiac decompensation.  Maintaining sinus rhythm on telemetry.  Inpatient Medications    Scheduled Meds:  amiodarone  200 mg Oral Daily   apixaban  5 mg Oral BID   fludrocortisone  0.2 mg Oral Daily   midodrine  15 mg Oral TID AC   multivitamin with minerals  1 tablet Oral Daily   [START ON 08/20/2022] pantoprazole  40 mg Oral Once per day on Mon Wed Fri   potassium chloride  40 mEq Oral Q2H   Continuous Infusions:  potassium chloride 10 mEq (08/19/22 1015)   PRN Meds: acetaminophen, albuterol, alum & mag hydroxide-simeth, dicyclomine, diphenhydrAMINE, loperamide, traMADol   Vital Signs    Vitals:   08/19/22 0500 08/19/22 0729 08/19/22 0809 08/19/22 1105  BP: (!) 86/52 (!) '90/56 98/62 91/67 '$  Pulse:  68  79  Resp:  '18 18 20  '$ Temp:  97.8 F (36.6 C)  98 F (36.7 C)  TempSrc:  Oral    SpO2:  100% 100% 100%  Weight:      Height:        Intake/Output Summary (Last 24 hours) at 08/19/2022 1126 Last data filed at 08/19/2022 0929 Gross per 24 hour  Intake 387.67 ml  Output 550 ml  Net -162.33 ml   Filed Weights   08/17/22 1151 08/18/22 0230 08/19/22 0100  Weight: (!) 140.7 kg (!) 145.3 kg (!) 145.1 kg    Telemetry    NSR with PACs and PVCs - Personally Reviewed  ECG    NSR with PVCs, 69 bpm, baseline artifact, poor R wave - Personally Reviewed  Physical Exam   GEN: No acute distress.   Neck: JVD difficult to assess secondary body habitus and beard. Cardiac: RRR, no murmurs, rubs, or gallops.  Respiratory: Clear to auscultation bilaterally.  GI: Soft, nontender, non-distended.   MS: Trace bilateral pretibial edema; compression socks not in place, no deformity. Neuro:  Alert and oriented x 3; Nonfocal.  Psych: Normal affect.  Labs    Chemistry Recent Labs  Lab  08/17/22 1208 08/18/22 0522 08/19/22 0524  NA 137 140 139  K 2.8* 3.7 2.7*  CL 101 104 103  CO2 23 31 32  GLUCOSE 93 87 88  BUN '15 15 14  '$ CREATININE 1.15 1.03 0.84  CALCIUM 7.7* 7.3* 7.0*  PROT 5.3*  --   --   ALBUMIN 2.0*  --   --   AST 31  --   --   ALT 16  --   --   ALKPHOS 88  --   --   BILITOT 0.8  --   --   GFRNONAA >60 >60 >60  ANIONGAP 13 5 4*     Hematology Recent Labs  Lab 08/17/22 1208 08/18/22 0522 08/19/22 0524  WBC 14.3* 6.4 7.1  RBC 4.39 3.50* 3.44*  HGB 11.8* 9.5* 9.4*  HCT 37.5* 29.4* 28.9*  MCV 85.4 84.0 84.0  MCH 26.9 27.1 27.3  MCHC 31.5 32.3 32.5  RDW 17.2* 17.7* 17.2*  PLT 290 212 190    Cardiac EnzymesNo results for input(s): "TROPONINI" in the last 168 hours. No results for input(s): "TROPIPOC" in the last 168 hours.   BNP Recent Labs  Lab 08/17/22 1208  BNP 138.3*     DDimer No results for input(s): "DDIMER" in the last 168 hours.  Radiology    CT HEAD WO CONTRAST (5MM)  Result Date: 08/17/2022 IMPRESSION: No acute intracranial abnormality. Electronically Signed   By: Ulyses Jarred M.D.   On: 08/17/2022 19:05   DG Chest Portable 1 View  Result Date: 08/17/2022 IMPRESSION: No active disease. No evidence of pneumonia or pulmonary edema. Stable cardiomegaly. Electronically Signed   By: Franki Cabot M.D.   On: 08/17/2022 12:51    Cardiac Studies   2D echo 07/08/2022: 1. Challenging images.   2. Left ventricular ejection fraction, by estimation, is 60 to 65%. The  left ventricle has normal function. The left ventricle has no regional  wall motion abnormalities. Left ventricular diastolic parameters are  indeterminate.   3. Right ventricular systolic function is normal. The right ventricular  size is mildly enlarged.   4. The mitral valve is normal in structure. No evidence of mitral valve  regurgitation. No evidence of mitral stenosis.   5. The aortic valve was not well visualized. Aortic valve regurgitation  is not  visualized. No aortic stenosis is present.   6. The inferior vena cava is normal in size with greater than 50%  respiratory variability, suggesting right atrial pressure of 3 mmHg.   7. Right atrial size was mildly dilated.   8. Unable to exclude abnormal rhythm, atrial fibrillation.   Comparison(s): 01/10/21 EF 50-55%.   Patient Profile     77 y.o. male with history of persistent A-fib, SVT versus junctional tachycardia, orthostatic hypotension, venous insufficiency, partial SBO, morbid obesity with untreated OSA and OHS on home oxygen, and intermittent chronic diarrhea who is being seen today for the evaluation of A-fib at the request of Dr. Roosevelt Locks.   Assessment & Plan    1. Persistent A-fib: -Maintaining sinus rhythm this admission -No indication for DCCV at this time -Now on amiodarone 200 mg daily -Continue apixaban 5 mg twice daily   2.  Orthostatic hypotension: -Orthostatic vital signs negative  -PTA midodrine and fludrocortisone -Continue with thigh-high compression socks and abdominal binder. -Unclear if he would benefit from Northera   3.  Weakness and deconditioning: -Likely multifactorial including orthostatic hypotension, physical deconditioning, untreated sleep apnea with OHS, intermittent volume loss through diarrhea, and hypoalbuminemia -Will need PT/OT -Ongoing management per IM   4.  Hypoalbuminemia: -Contributing to overall presentation -Repleted via IV   5.  Hypokalemia/hypomagnesemia: -Repletion recommended per IM   6.  HFpEF: -Volume status is difficult to assess on physical exam secondary to body habitus -Chest x-ray without acute cardiopulmonary process -BNP 138 -Will need to monitor fluid status on fludrocortisone   7.  Morbid obesity with untreated sleep apnea and OHS: -Would benefit from sleep evaluation as outpatient -Intolerant to BiPAP -Remains on supplemental oxygen       For questions or updates, please contact Arthur Please  consult [email or web address] for contact info under Cardiology/STEMI.    Signed, Christell Faith, PA-C [facility] Pager: [phone] 08/19/2022, 11:26 AM

## 2022-08-19 NOTE — Progress Notes (Signed)
       CROSS COVER NOTE  NAME: Ernest [name] MRN: [id number] DOB : [birth year] ATTENDING PHYSICIAN: Ernest Hones, MD    Date of Service   08/19/2022   HPI/Events of Note   Notified of Critical K this morning --> 2.7. Mg 1.6  Interventions   Assessment/Plan:  40 mEQ PO Kx2 2 g IV Mg      To reach the provider On-Call:   7AM- 7PM see care teams to locate the attending and reach out to them via [email or web address] Password: [email or web address] 7PM-7AM contact night-coverage If you still have difficulty reaching the appropriate provider, please page the [facility] (Director on Call) for [facility] on amion for assistance  This document was prepared using Systems analyst and may include unintentional dictation errors.  Ernest Glass DNP, MBA, FNP-BC, PMHNP-BC Nurse Practitioner [facility] Pager [phone]

## 2022-08-19 NOTE — Progress Notes (Signed)
Progress Note   Patient: Ernest [name] DOB: 1945/09/01 DOA: 08/17/2022     1 DOS: the patient was seen and examined on 08/19/2022   Brief hospital course: Ernest [name] is a 77 y.o. male with medical history significant of A fib on Eliquis, orthostatic hypotension, syncope and fall, dCHF, on chronic 4L of O2, GERD, anemia, OSA (not able to tolerate BiPAP), who presents with fall.   Significant hypotension, received albumin.  Seen by cardiology, appeared to have a chronic hypotension, no additional treatment is available. Patient is pending nursing placement.   Principal Problem:   Fall Active Problems:   Hypotension   Hypomagnesemia   Orthostatic hypotension   Hypokalemia   Persistent atrial fibrillation (HCC)   Leukocytosis   Obesity, Class III, BMI 40-49.9 (morbid obesity) (HCC)   B12 deficiency anemia   Chronic respiratory failure with hypoxia (HCC)   OSA (obstructive sleep apnea)   Chronic diastolic CHF (congestive heart failure) (HCC)   Assessment and Plan: Fall at home. Orthostatic hypotension. Patient did not loss consciousness at home, no syncope.  However, he was dizzy with low blood pressure.  Currently he is taking midodrine 15 mg 3 times a day, Florinef 0.2 mg daily.  Patient still feels dizzy today. Reviewed echocardiogram performed in January 2024, ejection fraction 60 to 65% without valvular abnormality. Cortisol level performed on 2/5 was normal at 11.6. Hypotension most likely due to autonomic dysfunction. Patient albumin was low at 2.0, received 25 g albumin. Patient has been elevated by PT/OT, recommended nursing home placement.  Discussed with cardiology, no additional medication available for hypotension.  Patient needs rehab to increase mobility, Ace wrap bilateral lower extremity.   Vitamin B12 deficient anemia. Small intestine bacterial overgrowth. Patient hemoglobin in the low, B12 on 2/7 was 140.  Will give B12 injection while  in the hospital.  Switch to oral B12 at time of discharge.  Patient may have absorption small intestine bacterial overgrowth, will need to follow-up with PCP to determine if oral B12 is sufficient.   Persistent atrial fibrillation. Continue anticoagulation.  Heart rate under control.   Hypokalemia. Hypomagnesemia. Replete potassium magnesium again today.  Check levels tomorrow.   Chronic diastolic congestive heart failure.   Chronic respiratory failure with hypoxemia on 4 L oxygen. Morbid obesity. Obstructive sleep apnea. Conditions are stable.      Subjective:  Patient still has some weakness, no shortness of breath.  Physical Exam: Vitals:   08/19/22 0500 08/19/22 0729 08/19/22 0809 08/19/22 1105  BP: (!) 86/52 (!) '90/56 98/62 91/67 '$  Pulse:  68  79  Resp:  '18 18 20  '$ Temp:  97.8 F (36.6 C)  98 F (36.7 C)  TempSrc:  Oral    SpO2:  100% 100% 100%  Weight:      Height:       General exam: Appears calm and comfortable, obese obese. Respiratory system: Clear to auscultation. Respiratory effort normal. Cardiovascular system: Irregular. No JVD, murmurs, rubs, gallops or clicks. Gastrointestinal system: Abdomen is nondistended, soft and nontender. No organomegaly or masses felt. Normal bowel sounds heard. Central nervous system: Alert and oriented. No focal neurological deficits. Extremities: 2+ leg edema Skin: No rashes, lesions or ulcers Psychiatry: Judgement and insight appear normal. Mood & affect appropriate.    Data Reviewed:  Lab results reviewed.  Family Communication: None  Disposition: Status is: Inpatient Remains inpatient appropriate because: Severity of disease, unsafe discharge.  Pending nursing home placement  Time spent: 35 minutes  Author: Sharen Hones, MD 08/19/2022 12:37 PM  For on call review [email or web address].

## 2022-08-19 NOTE — TOC Progression Note (Signed)
Transition of Care [facility]) - Progression Note    Patient Details  Name: Ernest [name] MRN: [id number] Date of Birth: [birth year]  Transition of Care [facility]) CM/SW Contact  Gerilyn Pilgrim, LCSW Phone Number: 08/19/2022, 12:04 PM  Clinical Narrative:   CSW spoke with pt regarding SNF recommendation, pt just discharged from [facility] two weeks ago. Pt feels he may need LTC due to his wifes inability to assist at discharge. Pt states he will be willing to go to SNF at [facility] but wants to see what his options are. RN in with PT. CSW unable to finish assessment, CSW will follow back up with patient.    Expected Discharge Plan: [place] Barriers to Discharge: Continued Medical Work up  Expected Discharge Plan and Services                                               Social Determinants of Health (SDOH) Interventions SDOH Screenings   Food Insecurity: No Food Insecurity (07/26/2022)  Housing: Low Risk  (07/26/2022)  Transportation Needs: No Transportation Needs (07/26/2022)  Utilities: Not At Risk (07/26/2022)  Alcohol Screen: Low Risk  (06/21/2021)  Depression (PHQ2-9): Low Risk  (06/21/2021)  Financial Resource Strain: Low Risk  (06/21/2021)  Physical Activity: Inactive (06/21/2021)  Social Connections: Unknown (06/21/2021)  Stress: No Stress Concern Present (06/21/2021)  Tobacco Use: Low Risk  (08/17/2022)    Readmission Risk Interventions     No data to display

## 2022-08-20 ENCOUNTER — Ambulatory Visit: Payer: Medicare Other | Admitting: Cardiology

## 2022-08-20 LAB — BASIC METABOLIC PANEL
Anion gap: 4 — ABNORMAL LOW (ref 5–15)
BUN: 15 mg/dL (ref 8–23)
CO2: 28 mmol/L (ref 22–32)
Calcium: 7.2 mg/dL — ABNORMAL LOW (ref 8.9–10.3)
Chloride: 106 mmol/L (ref 98–111)
Creatinine, Ser: 0.82 mg/dL (ref 0.61–1.24)
GFR, Estimated: >60 mL/min (ref >60)
Glucose, Bld: 83 mg/dL (ref 70–99)
Potassium: 3.3 mmol/L — ABNORMAL LOW (ref 3.5–5.1)
Sodium: 138 mmol/L (ref 135–145)

## 2022-08-20 LAB — IRON AND TIBC: Iron: 50 ug/dL (ref 45–182)

## 2022-08-20 LAB — FERRITIN: Ferritin: 131 ng/mL (ref 24–336)

## 2022-08-20 LAB — MAGNESIUM: Magnesium: 2 mg/dL (ref 1.7–2.4)

## 2022-08-20 MED ORDER — POTASSIUM CHLORIDE CRYS ER 20 MEQ PO TBCR
60.0000 meq | EXTENDED_RELEASE_TABLET | Freq: Once | ORAL | Status: AC
  Administered 2022-08-20: 60 meq via ORAL
  Filled 2022-08-20: qty 3

## 2022-08-20 NOTE — Progress Notes (Signed)
Physical Therapy Treatment Patient Details Name: Ernest [name] MRN: [id number] DOB: [birth year] Today's Date: 08/20/2022   History of Present Illness Pt is a 77 y.o. male with medical history significant of A fib on Eliquis, orthostatic hypotension, syncope and fall, dCHF, respiratory failure with hypoxia on chronic 4L of O2, GERD, anemia, OSA (not able to tolerate BiPAP), who presents with fall.  MD assessment includes: fall, orthostatic hypotension, hypokalemia, and leukocytosis.    PT Comments    Pt was pleasant and motivated to participate during the session and put forth good effort throughout. Pt required extra time and effort only going from sup to sit with use of bed rail.  Pt able to perform transfers with cues for proper sequencing with extra effort to stand from elevated surfaces but required no physical assist.  Pt was able to amb 2 x 5' with cues for amb closer to the RW with upright posture for improved stability with only min reported dizziness.  Pt's BP taken once back in sitting at 102/70 with HR 79, abdominal binder donned during the session.  Pt will benefit from PT services in a SNF setting upon discharge to safely address deficits listed in patient problem list for decreased caregiver assistance and eventual return to PLOF.     Recommendations for follow up therapy are one component of a multi-disciplinary discharge planning process, led by the attending physician.  Recommendations may be updated based on patient status, additional functional criteria and insurance authorization.  Follow Up Recommendations  Skilled nursing-short term rehab (<3 hours/day) Can patient physically be transported by private vehicle: No   Assistance Recommended at Discharge Frequent or constant Supervision/Assistance  Patient can return home with the following A lot of help with bathing/dressing/bathroom;Assist for transportation;Help with stairs or ramp for entrance;Assistance with  cooking/housework;A lot of help with walking and/or transfers   Equipment Recommendations  Other (comment) (TBD)    Recommendations for Other Services       Precautions / Restrictions Precautions Precautions: Fall Restrictions Weight Bearing Restrictions: No Other Position/Activity Restrictions: Orthostatic hypotension, has an abdominal binder     Mobility  Bed Mobility Overal bed mobility: Modified Independent             General bed mobility comments: Extra time and effort only    Transfers Overall transfer level: Needs assistance Equipment used: Rolling walker (2 wheels) Transfers: Sit to/from Stand Sit to Stand: Min guard           General transfer comment: Heavy use of BUEs to come to standing; mod verbal and visual cues for increased trunk flexion during stand to sit    Ambulation/Gait Ambulation/Gait assistance: Min guard Gait Distance (Feet): 5 Feet x 2 Assistive device: Rolling walker (2 wheels) Gait Pattern/deviations: Step-through pattern, Trunk flexed, Decreased step length - right, Decreased step length - left Gait velocity: decreased     General Gait Details: Slow cadence with short B step length but steady without LOB   Stairs             Wheelchair Mobility    Modified Rankin (Stroke Patients Only)       Balance Overall balance assessment: Needs assistance, History of Falls Sitting-balance support: Feet supported Sitting balance-Leahy Scale: Normal     Standing balance support: Bilateral upper extremity supported, During functional activity Standing balance-Leahy Scale: Fair  Cognition Arousal/Alertness: Awake/alert Behavior During Therapy: WFL for tasks assessed/performed Overall Cognitive Status: Within Functional Limits for tasks assessed                                          Exercises Total Joint Exercises Ankle Circles/Pumps: AROM, Strengthening, Both,  10 reps Quad Sets: Strengthening, Both, 10 reps Gluteal Sets: Strengthening, Both, 10 reps Hip ABduction/ADduction: Strengthening, Both, 10 reps, AROM Straight Leg Raises: AROM, Strengthening, Both, 10 reps Long Arc Quad: Strengthening, Both, 10 reps Other Exercises Other Exercises: Pt education/practice on staying within RW during turns for improved UE support    General Comments        Pertinent Vitals/Pain Pain Assessment Pain Assessment: No/denies pain    Home Living                          Prior Function            PT Goals (current goals can now be found in the care plan section) Progress towards PT goals: Progressing toward goals    Frequency    Min 2X/week      PT Plan Current plan remains appropriate    Co-evaluation              AM-PAC PT "6 Clicks" Mobility   Outcome Measure  Help needed turning from your back to your side while in a flat bed without using bedrails?: A Little Help needed moving from lying on your back to sitting on the side of a flat bed without using bedrails?: A Little Help needed moving to and from a bed to a chair (including a wheelchair)?: A Little Help needed standing up from a chair using your arms (e.g., wheelchair or bedside chair)?: A Little Help needed to walk in hospital room?: A Lot Help needed climbing 3-5 steps with a railing? : Total 6 Click Score: 15    End of Session Equipment Utilized During Treatment: Gait belt;Oxygen Activity Tolerance: Patient tolerated treatment well Patient left: in chair;with call bell/phone within reach;with chair alarm set Nurse Communication: Mobility status PT Visit Diagnosis: Unsteadiness on feet (R26.81);Muscle weakness (generalized) (M62.81);Difficulty in walking, not elsewhere classified (R26.2);History of falling (Z91.81)     Time: [id number] PT Time Calculation (min) (ACUTE ONLY): 28 min  Charges:  $Therapeutic Exercise: 8-22 mins $Therapeutic Activity: 8-22  mins                     D. Scott Aldair Rickel PT, DPT 08/20/22, 2:48 PM

## 2022-08-20 NOTE — Progress Notes (Addendum)
Progress Note   Patient: Ernest [name] [id number] DOB: 05/03/1946 DOA: 08/17/2022     2 DOS: the patient was seen and examined on 08/20/2022   Brief hospital course: Ernest [name] is a 77 y.o. male with medical history significant of A fib on Eliquis, orthostatic hypotension, syncope and fall, dCHF, on chronic 4L of O2, GERD, anemia, OSA (not able to tolerate BiPAP), who presents with fall.   Significant hypotension, received albumin.  Seen by cardiology, appeared to have a chronic hypotension, no additional treatment is available. Patient is pending nursing placement.   Principal Problem:   Fall Active Problems:   Hypotension   Hypomagnesemia   Orthostatic hypotension   Hypokalemia   Persistent atrial fibrillation (HCC)   Leukocytosis   Obesity, Class III, BMI 40-49.9 (morbid obesity) (HCC)   B12 deficiency anemia   Chronic respiratory failure with hypoxia (HCC)   OSA (obstructive sleep apnea)   Chronic diastolic CHF (congestive heart failure) (HCC)   Assessment and Plan: Fall at home. Orthostatic hypotension. Patient did not loss consciousness at home, no syncope.  However, he was dizzy with low blood pressure.  Currently he is taking midodrine 15 mg 3 times a day, Florinef 0.2 mg daily.  Patient still feels dizzy today. Reviewed echocardiogram performed in January 2024, ejection fraction 60 to 65% without valvular abnormality. Cortisol level performed on 2/5 was normal at 11.6. Hypotension most likely due to autonomic dysfunction. Patient albumin was low at 2.0, received 25 g albumin. Patient has been elevated by PT/OT, recommended nursing home placement.  Discussed with cardiology, no additional medication available for hypotension.  Patient needs rehab to increase mobility, Ace wrap bilateral lower extremity. F/u dr. Mylo Red of cardiology 2 weeks   Vitamin B12 deficient anemia. Small intestine bacterial overgrowth. Patient hemoglobin in the low, B12 on 2/7 was  140.  Will give B12 injection while in the hospital.  Switch to oral B12 at time of discharge.  Patient may have absorption small intestine bacterial overgrowth, will need to follow-up with PCP to determine if oral B12 is sufficient. Will also check iron panel, there is no report of melena or other bleeding.   Persistent atrial fibrillation. Continue anticoagulation.  Heart rate under control.   Hypokalemia. Hypomagnesemia. Replete potassium today.  Check levels tomorrow.   Chronic diastolic congestive heart failure.   Chronic respiratory failure with hypoxemia on 4 L oxygen. Morbid obesity. Obstructive sleep apnea. Conditions are stable.      Subjective:  Feeling well, tolerating diet, worked with OT earlier, no constipation  Physical Exam: Vitals:   08/20/22 0014 08/20/22 0449 08/20/22 0611 08/20/22 0817  BP: 91/61 (!) 82/56 90/60 102/67  Pulse: (!) 53 (!) 53 62 83  Resp: '16 17 18 17  '$ Temp:  98.2 F (36.8 C)  98.4 F (36.9 C)  TempSrc:      SpO2: 98% 100% 100% 100%  Weight:  (!) 143.8 kg    Height:       General exam: Appears calm and comfortable, obese obese. Respiratory system: Clear to auscultation. Respiratory effort normal. Cardiovascular system: Irregular. No JVD, murmurs, rubs, gallops or clicks.  Gastrointestinal system: Abdomen is obese, soft and nontender. No organomegaly or masses felt. Normal bowel sounds heard. Central nervous system: Alert and oriented. No focal neurological deficits. Extremities: legs are ace wrapped Skin: No rashes, lesions or ulcers Psychiatry: Judgement and insight appear normal. Mood & affect appropriate.    Data Reviewed:  Lab results reviewed.  Family  Communication: wife updated telephonically 2/28  Disposition: Status is: Inpatient Remains inpatient appropriate because: pending snf placement     Time spent: 35 minutes  Author: Desma Maxim, MD 08/20/2022 12:08 PM  For on call review [email or web address].

## 2022-08-20 NOTE — TOC Progression Note (Signed)
Transition of Care [facility]) - Progression Note    Patient Details  Name: Ernest [name] MRN: [id number] Date of Birth: 1945-12-11  Transition of Care [facility]) CM/SW Contact  Gerilyn Pilgrim, LCSW Phone Number: 08/20/2022, 3:56 PM  Clinical Narrative:   Josem Kaufmann started for [facility].    Expected Discharge Plan: [place] Barriers to Discharge: Continued Medical Work up  Expected Discharge Plan and Services                                               Social Determinants of Health (SDOH) Interventions SDOH Screenings   Food Insecurity: No Food Insecurity (07/26/2022)  Housing: Low Risk  (07/26/2022)  Transportation Needs: No Transportation Needs (07/26/2022)  Utilities: Not At Risk (07/26/2022)  Alcohol Screen: Low Risk  (06/21/2021)  Depression (PHQ2-9): Low Risk  (06/21/2021)  Financial Resource Strain: Low Risk  (06/21/2021)  Physical Activity: Inactive (06/21/2021)  Social Connections: Unknown (06/21/2021)  Stress: No Stress Concern Present (06/21/2021)  Tobacco Use: Low Risk  (08/17/2022)    Readmission Risk Interventions     No data to display

## 2022-08-20 NOTE — Progress Notes (Signed)
Occupational Therapy Treatment Patient Details Name: Ernest [name] MRN: [id number] DOB: 15-Jun-1946 Today's Date: 08/20/2022   History of present illness Pt is a 77 y.o. male with medical history significant of A fib on Eliquis, orthostatic hypotension, syncope and fall, dCHF, respiratory failure with hypoxia on chronic 4L of O2, GERD, anemia, OSA (not able to tolerate BiPAP), who presents with fall.  MD assessment includes: fall, orthostatic hypotension, hypokalemia, and leukocytosis.   OT comments  [name] was seen for OT treatment on this date. Upon arrival to room pt standing at [facility] with RN/NT. Pt requires MIN A  + BRW for bed<>chair t/f, assist for RW mgmt and cues. MAX A don B socks seated EOB. MIN A don abdominal binder seated. Pt making good progress toward goals, will continue to follow POC. Discharge recommendation remains appropriate.     Recommendations for follow up therapy are one component of a multi-disciplinary discharge planning process, led by the attending physician.  Recommendations may be updated based on patient status, additional functional criteria and insurance authorization.    Follow Up Recommendations  Skilled nursing-short term rehab (<3 hours/day)     Assistance Recommended at Discharge Intermittent Supervision/Assistance  Patient can return home with the following  A lot of help with bathing/dressing/bathroom;Assistance with cooking/housework;Help with stairs or ramp for entrance;Assist for transportation;A little help with walking and/or transfers   Equipment Recommendations  BSC/3in1    Recommendations for Other Services      Precautions / Restrictions Precautions Precautions: Fall Precaution Comments: abdominal binder and ted hose; Restrictions Weight Bearing Restrictions: No Other Position/Activity Restrictions: Orthostatic hypotension       Mobility Bed Mobility Overal bed mobility: Needs Assistance       Supine to sit: Modified  independent (Device/Increase time) Sit to supine: Min assist        Transfers Overall transfer level: Needs assistance Equipment used: Rolling walker (2 wheels) Transfers: Sit to/from Stand, Bed to chair/wheelchair/BSC Sit to Stand: Min guard, From elevated surface     Step pivot transfers: Min assist           Balance Overall balance assessment: Needs assistance, History of Falls Sitting-balance support: Feet supported Sitting balance-Leahy Scale: Good     Standing balance support: Bilateral upper extremity supported, During functional activity Standing balance-Leahy Scale: Fair                             ADL either performed or assessed with clinical judgement   ADL Overall ADL's : Needs assistance/impaired                                       General ADL Comments: MIN A  + BRW for bed<>chair t/f, assist for RW mgmt and cues. MAX A don B socks seated EOB. MIN A don abdominal binder seated.      Cognition Arousal/Alertness: Awake/alert Behavior During Therapy: WFL for tasks assessed/performed Overall Cognitive Status: Within Functional Limits for tasks assessed                                                     Pertinent Vitals/ Pain       Pain Assessment Pain Assessment: No/denies  pain   Frequency  Min 2X/week        Progress Toward Goals  OT Goals(current goals can now be found in the care plan section)  Progress towards OT goals: Progressing toward goals  Acute Rehab OT Goals Patient Stated Goal: to go home OT Goal Formulation: With patient/family Time For Goal Achievement: 09/01/22 Potential to Achieve Goals: Good ADL Goals Pt Will Perform Grooming: with min guard assist;standing Pt Will Perform Lower Body Dressing: with modified independence;sit to/from stand;with adaptive equipment Pt Will Transfer to Toilet: with modified independence;ambulating;bedside commode  Plan Discharge plan  remains appropriate    Co-evaluation                 AM-PAC OT "6 Clicks" Daily Activity     Outcome Measure   Help from another person eating meals?: None Help from another person taking care of personal grooming?: A Little Help from another person toileting, which includes using toliet, bedpan, or urinal?: A Lot Help from another person bathing (including washing, rinsing, drying)?: A Lot Help from another person to put on and taking off regular upper body clothing?: A Little Help from another person to put on and taking off regular lower body clothing?: A Lot 6 Click Score: 16    End of Session Equipment Utilized During Treatment: Rolling walker (2 wheels);Oxygen  OT Visit Diagnosis: Other abnormalities of gait and mobility (R26.89);Muscle weakness (generalized) (M62.81)   Activity Tolerance Patient tolerated treatment well   Patient Left in bed;with call bell/phone within reach;with family/visitor present   Nurse Communication Mobility status        Time: [id number] OT Time Calculation (min): 25 min  Charges: OT General Charges $OT Visit: 1 Visit OT Treatments $Self Care/Home Management : 23-37 mins  Ernest [name], M.S. OTR/L  08/20/22, 10:30 AM  ascom [phone]

## 2022-08-21 DIAGNOSIS — M6281 Muscle weakness (generalized): Secondary | ICD-10-CM | POA: Diagnosis not present

## 2022-08-21 DIAGNOSIS — E611 Iron deficiency: Secondary | ICD-10-CM | POA: Diagnosis not present

## 2022-08-21 DIAGNOSIS — J9601 Acute respiratory failure with hypoxia: Secondary | ICD-10-CM | POA: Diagnosis not present

## 2022-08-21 DIAGNOSIS — M2550 Pain in unspecified joint: Secondary | ICD-10-CM | POA: Diagnosis not present

## 2022-08-21 DIAGNOSIS — I739 Peripheral vascular disease, unspecified: Secondary | ICD-10-CM | POA: Diagnosis not present

## 2022-08-21 DIAGNOSIS — I5030 Unspecified diastolic (congestive) heart failure: Secondary | ICD-10-CM | POA: Diagnosis not present

## 2022-08-21 DIAGNOSIS — R55 Syncope and collapse: Secondary | ICD-10-CM | POA: Diagnosis not present

## 2022-08-21 DIAGNOSIS — Z9981 Dependence on supplemental oxygen: Secondary | ICD-10-CM | POA: Diagnosis not present

## 2022-08-21 DIAGNOSIS — T50904A Poisoning by unspecified drugs, medicaments and biological substances, undetermined, initial encounter: Secondary | ICD-10-CM | POA: Diagnosis not present

## 2022-08-21 DIAGNOSIS — K638219 Small intestinal bacterial overgrowth, unspecified: Secondary | ICD-10-CM | POA: Diagnosis not present

## 2022-08-21 DIAGNOSIS — K909 Intestinal malabsorption, unspecified: Secondary | ICD-10-CM | POA: Diagnosis not present

## 2022-08-21 DIAGNOSIS — R6889 Other general symptoms and signs: Secondary | ICD-10-CM | POA: Diagnosis not present

## 2022-08-21 DIAGNOSIS — N179 Acute kidney failure, unspecified: Secondary | ICD-10-CM | POA: Diagnosis not present

## 2022-08-21 DIAGNOSIS — R69 Illness, unspecified: Secondary | ICD-10-CM | POA: Diagnosis not present

## 2022-08-21 DIAGNOSIS — N390 Urinary tract infection, site not specified: Secondary | ICD-10-CM | POA: Diagnosis not present

## 2022-08-21 DIAGNOSIS — R0602 Shortness of breath: Secondary | ICD-10-CM | POA: Diagnosis not present

## 2022-08-21 DIAGNOSIS — Z6837 Body mass index (BMI) 37.0-37.9, adult: Secondary | ICD-10-CM | POA: Diagnosis not present

## 2022-08-21 DIAGNOSIS — Z515 Encounter for palliative care: Secondary | ICD-10-CM | POA: Diagnosis not present

## 2022-08-21 DIAGNOSIS — E876 Hypokalemia: Secondary | ICD-10-CM | POA: Diagnosis not present

## 2022-08-21 DIAGNOSIS — J9610 Chronic respiratory failure, unspecified whether with hypoxia or hypercapnia: Secondary | ICD-10-CM | POA: Diagnosis not present

## 2022-08-21 DIAGNOSIS — I471 Supraventricular tachycardia, unspecified: Secondary | ICD-10-CM | POA: Diagnosis not present

## 2022-08-21 DIAGNOSIS — D649 Anemia, unspecified: Secondary | ICD-10-CM | POA: Diagnosis not present

## 2022-08-21 DIAGNOSIS — Z8719 Personal history of other diseases of the digestive system: Secondary | ICD-10-CM | POA: Diagnosis not present

## 2022-08-21 DIAGNOSIS — M170 Bilateral primary osteoarthritis of knee: Secondary | ICD-10-CM | POA: Diagnosis not present

## 2022-08-21 DIAGNOSIS — Z8269 Family history of other diseases of the musculoskeletal system and connective tissue: Secondary | ICD-10-CM | POA: Diagnosis not present

## 2022-08-21 DIAGNOSIS — E8809 Other disorders of plasma-protein metabolism, not elsewhere classified: Secondary | ICD-10-CM | POA: Diagnosis not present

## 2022-08-21 DIAGNOSIS — I509 Heart failure, unspecified: Secondary | ICD-10-CM | POA: Diagnosis not present

## 2022-08-21 DIAGNOSIS — G4733 Obstructive sleep apnea (adult) (pediatric): Secondary | ICD-10-CM | POA: Diagnosis not present

## 2022-08-21 DIAGNOSIS — M7989 Other specified soft tissue disorders: Secondary | ICD-10-CM | POA: Diagnosis not present

## 2022-08-21 DIAGNOSIS — E538 Deficiency of other specified B group vitamins: Secondary | ICD-10-CM | POA: Diagnosis not present

## 2022-08-21 DIAGNOSIS — E861 Hypovolemia: Secondary | ICD-10-CM | POA: Diagnosis not present

## 2022-08-21 DIAGNOSIS — K580 Irritable bowel syndrome with diarrhea: Secondary | ICD-10-CM | POA: Diagnosis not present

## 2022-08-21 DIAGNOSIS — R296 Repeated falls: Secondary | ICD-10-CM | POA: Diagnosis not present

## 2022-08-21 DIAGNOSIS — G909 Disorder of the autonomic nervous system, unspecified: Secondary | ICD-10-CM | POA: Diagnosis not present

## 2022-08-21 DIAGNOSIS — M549 Dorsalgia, unspecified: Secondary | ICD-10-CM | POA: Diagnosis not present

## 2022-08-21 DIAGNOSIS — Z1152 Encounter for screening for COVID-19: Secondary | ICD-10-CM | POA: Diagnosis not present

## 2022-08-21 DIAGNOSIS — I110 Hypertensive heart disease with heart failure: Secondary | ICD-10-CM | POA: Diagnosis not present

## 2022-08-21 DIAGNOSIS — I10 Essential (primary) hypertension: Secondary | ICD-10-CM | POA: Diagnosis not present

## 2022-08-21 DIAGNOSIS — Z66 Do not resuscitate: Secondary | ICD-10-CM | POA: Diagnosis not present

## 2022-08-21 DIAGNOSIS — K219 Gastro-esophageal reflux disease without esophagitis: Secondary | ICD-10-CM | POA: Diagnosis not present

## 2022-08-21 DIAGNOSIS — D519 Vitamin B12 deficiency anemia, unspecified: Secondary | ICD-10-CM | POA: Diagnosis not present

## 2022-08-21 DIAGNOSIS — I4819 Other persistent atrial fibrillation: Secondary | ICD-10-CM | POA: Diagnosis not present

## 2022-08-21 DIAGNOSIS — E669 Obesity, unspecified: Secondary | ICD-10-CM | POA: Diagnosis not present

## 2022-08-21 DIAGNOSIS — I5032 Chronic diastolic (congestive) heart failure: Secondary | ICD-10-CM | POA: Diagnosis not present

## 2022-08-21 DIAGNOSIS — M62838 Other muscle spasm: Secondary | ICD-10-CM | POA: Diagnosis not present

## 2022-08-21 DIAGNOSIS — R0989 Other specified symptoms and signs involving the circulatory and respiratory systems: Secondary | ICD-10-CM | POA: Diagnosis not present

## 2022-08-21 DIAGNOSIS — M150 Primary generalized (osteo)arthritis: Secondary | ICD-10-CM | POA: Diagnosis not present

## 2022-08-21 DIAGNOSIS — I950 Idiopathic hypotension: Secondary | ICD-10-CM | POA: Diagnosis not present

## 2022-08-21 DIAGNOSIS — Z79899 Other long term (current) drug therapy: Secondary | ICD-10-CM | POA: Diagnosis not present

## 2022-08-21 DIAGNOSIS — I5043 Acute on chronic combined systolic (congestive) and diastolic (congestive) heart failure: Secondary | ICD-10-CM | POA: Diagnosis not present

## 2022-08-21 DIAGNOSIS — E662 Morbid (severe) obesity with alveolar hypoventilation: Secondary | ICD-10-CM | POA: Diagnosis present

## 2022-08-21 DIAGNOSIS — Z993 Dependence on wheelchair: Secondary | ICD-10-CM | POA: Diagnosis not present

## 2022-08-21 DIAGNOSIS — J9621 Acute and chronic respiratory failure with hypoxia: Secondary | ICD-10-CM | POA: Diagnosis not present

## 2022-08-21 DIAGNOSIS — Z743 Need for continuous supervision: Secondary | ICD-10-CM | POA: Diagnosis not present

## 2022-08-21 DIAGNOSIS — I959 Hypotension, unspecified: Secondary | ICD-10-CM | POA: Diagnosis not present

## 2022-08-21 DIAGNOSIS — K921 Melena: Secondary | ICD-10-CM | POA: Diagnosis not present

## 2022-08-21 DIAGNOSIS — Z7901 Long term (current) use of anticoagulants: Secondary | ICD-10-CM | POA: Diagnosis not present

## 2022-08-21 DIAGNOSIS — I4891 Unspecified atrial fibrillation: Secondary | ICD-10-CM | POA: Diagnosis not present

## 2022-08-21 DIAGNOSIS — R570 Cardiogenic shock: Secondary | ICD-10-CM | POA: Diagnosis not present

## 2022-08-21 DIAGNOSIS — D72829 Elevated white blood cell count, unspecified: Secondary | ICD-10-CM | POA: Diagnosis not present

## 2022-08-21 DIAGNOSIS — F1090 Alcohol use, unspecified, uncomplicated: Secondary | ICD-10-CM | POA: Diagnosis not present

## 2022-08-21 DIAGNOSIS — R5383 Other fatigue: Secondary | ICD-10-CM | POA: Diagnosis not present

## 2022-08-21 DIAGNOSIS — J90 Pleural effusion, not elsewhere classified: Secondary | ICD-10-CM | POA: Diagnosis not present

## 2022-08-21 DIAGNOSIS — Z7189 Other specified counseling: Secondary | ICD-10-CM | POA: Diagnosis not present

## 2022-08-21 DIAGNOSIS — Z7952 Long term (current) use of systemic steroids: Secondary | ICD-10-CM | POA: Diagnosis not present

## 2022-08-21 DIAGNOSIS — I951 Orthostatic hypotension: Secondary | ICD-10-CM | POA: Diagnosis not present

## 2022-08-21 DIAGNOSIS — J9611 Chronic respiratory failure with hypoxia: Secondary | ICD-10-CM | POA: Diagnosis not present

## 2022-08-21 DIAGNOSIS — I872 Venous insufficiency (chronic) (peripheral): Secondary | ICD-10-CM | POA: Diagnosis not present

## 2022-08-21 DIAGNOSIS — G903 Multi-system degeneration of the autonomic nervous system: Secondary | ICD-10-CM | POA: Diagnosis not present

## 2022-08-21 LAB — BASIC METABOLIC PANEL
Anion gap: 3 — ABNORMAL LOW (ref 5–15)
BUN: 14 mg/dL (ref 8–23)
CO2: 30 mmol/L (ref 22–32)
Calcium: 7.3 mg/dL — ABNORMAL LOW (ref 8.9–10.3)
Chloride: 107 mmol/L (ref 98–111)
Creatinine, Ser: 0.85 mg/dL (ref 0.61–1.24)
GFR, Estimated: >60 mL/min (ref >60)
Glucose, Bld: 84 mg/dL (ref 70–99)
Potassium: 3.5 mmol/L (ref 3.5–5.1)
Sodium: 140 mmol/L (ref 135–145)

## 2022-08-21 LAB — CBC
HCT: 28 % — ABNORMAL LOW (ref 39.0–52.0)
Hemoglobin: 8.9 g/dL — ABNORMAL LOW (ref 13.0–17.0)
MCH: 27.3 pg (ref 26.0–34.0)
MCHC: 31.8 g/dL (ref 30.0–36.0)
MCV: 85.9 fL (ref 80.0–100.0)
Platelets: 189 10*3/uL (ref 150–400)
RBC: 3.26 MIL/uL — ABNORMAL LOW (ref 4.22–5.81)
RDW: 17.5 % — ABNORMAL HIGH (ref 11.5–15.5)
WBC: 7 10*3/uL (ref 4.0–10.5)
nRBC: 0 % (ref 0.0–0.2)

## 2022-08-21 MED ORDER — VITAMIN B-12 1000 MCG PO TABS: 1000.0000 ug | ORAL_TABLET | Freq: Every day | ORAL | 1 refills | Status: DC

## 2022-08-21 MED ORDER — TRAMADOL HCL 50 MG PO TABS: 50.0000 mg | ORAL_TABLET | Freq: Four times a day (QID) | ORAL | 0 refills | Status: DC | PRN

## 2022-08-21 MED ORDER — FUROSEMIDE 40 MG PO TABS: 40.0000 mg | ORAL_TABLET | ORAL | Status: DC

## 2022-08-21 MED ORDER — PANTOPRAZOLE SODIUM 40 MG PO TBEC: 40.0000 mg | DELAYED_RELEASE_TABLET | ORAL | Status: DC

## 2022-08-21 NOTE — Care Management Important Message (Signed)
Important Message  Patient Details  Name: Ernest [name] MRN: [id number] Date of Birth: [birth year]   Medicare Important Message Given:  Yes     [name] 08/21/2022, 9:30 AM

## 2022-08-21 NOTE — TOC Progression Note (Signed)
Transition of Care [facility]) - Progression Note    Patient Details  Name: Ernest [name] MRN: [id number] Date of Birth: Mar 08, 1946  Transition of Care [facility]) CM/SW Contact  Gerilyn Pilgrim, LCSW Phone Number: 08/21/2022, 8:30 AM  Clinical Narrative:   Pt has auth to discharge to [place] [id number] navi ID [place] good until 2/28-08/22/2022    Expected Discharge Plan: [place] Barriers to Discharge: Continued Medical Work up  Expected Discharge Plan and Services                                               Social Determinants of Health (SDOH) Interventions SDOH Screenings   Food Insecurity: No Food Insecurity (07/26/2022)  Housing: Low Risk  (07/26/2022)  Transportation Needs: No Transportation Needs (07/26/2022)  Utilities: Not At Risk (07/26/2022)  Alcohol Screen: Low Risk  (06/21/2021)  Depression (PHQ2-9): Low Risk  (06/21/2021)  Financial Resource Strain: Low Risk  (06/21/2021)  Physical Activity: Inactive (06/21/2021)  Social Connections: Unknown (06/21/2021)  Stress: No Stress Concern Present (06/21/2021)  Tobacco Use: Low Risk  (08/17/2022)    Readmission Risk Interventions     No data to display

## 2022-08-21 NOTE — Discharge Summary (Signed)
Ernest [name] [id number] DOB: 1945/08/23 DOA: 08/17/2022  PCP: [name], MD  Admit date: 08/17/2022 Discharge date: 08/21/2022  Time spent: 35 minutes  Recommendations for Outpatient Follow-up:  Pcp, cardiology, gi f/u Referred to hematology for anemia, b12 deficiency  Cbc 1 week to ensure    Discharge Diagnoses:  Principal Problem:   Orthostatic hypotension Active Problems:   Hypotension   Fall   Hypomagnesemia   Hypokalemia   Persistent atrial fibrillation (HCC)   Leukocytosis   Obesity, Class III, BMI 40-49.9 (morbid obesity) ([place])   B12 deficiency anemia   Chronic respiratory failure with hypoxia (HCC)   OSA (obstructive sleep apnea)   Chronic diastolic CHF (congestive heart failure) ([place])   Discharge Condition: stable  Diet recommendation: heart healthy  Filed Weights   08/19/22 0100 08/20/22 0449 08/21/22 0500  Weight: (!) 145.1 kg (!) 143.8 kg (P) 81.4 kg    History of present illness:  From admission h and p  Ernest [name] is a 77 y.o. male with medical history significant of A fib on Eliquis, orthostatic hypotension, syncope and fall, dCHF, on chronic 4L of O2, GERD, anemia, OSA (not able to tolerate BiPAP), who presents with fall.    Patient was recently hospitalized from 2/2 - 2/9 due to orthostatic hypotension and syncope.  After discharge, Midodrine was increased to 15 mg 3 times daily, Florinef increased to 20 mg daily and placed on abdominal binder and thigh-high compression stockings. Pt finished rehab and currently lives at home with his wife. He states that he still has dizziness and lightheadedness, particularly when standing up. Today he fell accidentally when he was attempting to sit down in a chair, but his legs "gave out. No loss of consciousness.  Denies significant injury.  Patient states that he has chronic diarrhea intermittently.  He is taking Bentyl as needed, currently no diarrhea.  Denies nausea, vomiting or abdominal pain.   Denies chest pain, no worsening shortness breath or coughing.  Denies symptoms of UTI.  No unilateral numbness or tinglings extremities.  Hospital Course:  Patient presents with recurrent falls at home, likely 2/2 known orthostatic hypotension. Cardiology consulted, advised continuing home compression of lower extremities, midodrine, fludrocortisone. Discharged to snf. No traumatic injuries from fall. Patient found to be with normocytic anemia, b12 deficiency. Likely 2/2 known sibo. Started on b12 injections here, discharged with oral b12, will need cbc in 1 week and f/u with gi and hematology (referred to the latter). No report of bleeding here and labs not strongly suggestive of iron deficiency.   Procedures: none   Consultations: cardiolgy  Discharge Exam: Vitals:   08/21/22 0533 08/21/22 0757  BP: (!) 92/55 92/60  Pulse: 69 68  Resp: 18 18  Temp: 98.2 F (36.8 C) 98.2 F (36.8 C)  SpO2: 100% 100%    General exam: Appears calm and comfortable, obese obese. Respiratory system: Clear to auscultation. Respiratory effort normal. Cardiovascular system: Irregular. No JVD, murmurs, rubs, gallops or clicks.  Gastrointestinal system: Abdomen is obese, soft and nontender. No organomegaly or masses felt. Normal bowel sounds heard. Central nervous system: Alert and oriented. No focal neurological deficits. Extremities: legs are ace wrapped Skin: No rashes, lesions or ulcers Psychiatry: Judgement and insight appear normal. Mood & affect appropriate.   Discharge Instructions   Discharge Instructions     Ambulatory referral to Hematology / Oncology   Complete by: As directed    Diet - low sodium heart healthy   Complete by: As  directed    Increase activity slowly   Complete by: As directed    No wound care   Complete by: As directed       Allergies as of 08/21/2022   No Known Allergies      Medication List     STOP taking these medications    PROBIOTIC DAILY PO        TAKE these medications    acetaminophen 325 MG tablet Commonly known as: TYLENOL Take 325 mg by mouth every 8 (eight) hours as needed for moderate pain.   amiodarone 200 MG tablet Commonly known as: PACERONE TAKE 1 TABLET BY MOUTH TWICE A DAY FOR 30 DAYS. THEN TAKE 1 TABLET EVERY DAY What changed: See the new instructions.   apixaban 5 MG Tabs tablet Commonly known as: ELIQUIS Take 1 tablet (5 mg total) by mouth 2 (two) times daily.   CENTRUM SILVER 50+MEN PO Take 1 tablet by mouth in the morning.   cyanocobalamin 1000 MCG tablet Commonly known as: VITAMIN B12 Take 1 tablet (1,000 mcg total) by mouth daily.   dicyclomine 10 MG capsule Commonly known as: BENTYL Take 1 capsule (10 mg total) by mouth 3 (three) times daily as needed for spasms.   fludrocortisone 0.1 MG tablet Commonly known as: FLORINEF Take 2 tablets (0.2 mg total) by mouth daily.   furosemide 40 MG tablet Commonly known as: LASIX Take 1 tablet (40 mg total) by mouth 3 (three) times a week. Take one tablet ('40mg'$ ) three times a week. Mon/Wed/Fri Start taking on: August 22, 2022   loperamide 2 MG capsule Commonly known as: IMODIUM Take 1 capsule (2 mg total) by mouth as needed for diarrhea or loose stools.   midodrine 10 MG tablet Commonly known as: PROAMATINE Take 1.5 tablets (15 mg total) by mouth 3 (three) times daily.   nystatin powder Commonly known as: MYCOSTATIN/NYSTOP Apply 1 Application topically 3 (three) times daily.   pantoprazole 40 MG tablet Commonly known as: PROTONIX Take 1 tablet (40 mg total) by mouth 3 (three) times a week. Takes 40 mg po Monday, Wednesday, Friday Start taking on: August 22, 2022   potassium chloride SA 20 MEQ tablet Commonly known as: KLOR-CON M Take 1 tablet (20 mEq total) by mouth 3 (three) times a week. What changed: additional instructions   traMADol 50 MG tablet Commonly known as: ULTRAM Take 1 tablet (50 mg total) by mouth every 6 (six) hours as needed  for moderate pain.               Durable Medical Equipment  (From admission, onward)           Start     Ordered   08/18/22 1531  For home use only DME Walker rolling  Once       Question Answer Comment  Walker: Other   Comments Bariatric   Patient needs a walker to treat with the following condition Generalized weakness      08/18/22 1531   08/18/22 1530  For home use only DME 3 n 1  Once       Comments: Bariatric   08/18/22 1530           No Known Allergies  Contact information for follow-up providers     [name], MD Follow up.   Specialties: Internal Medicine, Cardiology Contact information: [place] [address] [phone]         Lin Landsman, MD Follow up.  Specialty: Gastroenterology Contact information: [place] [address] [phone]         Kate Sable, MD Follow up.   Specialties: Cardiology, Radiology Contact information: [place]  [phone]              Contact information for after-discharge care     [place] [facility] Preferred SNF .   Service: Skilled Nursing Contact information: [place] [phone]                      The results of significant diagnostics from this hospitalization (including imaging, microbiology, ancillary and laboratory) are listed below for reference.    Significant Diagnostic Studies: CT HEAD WO CONTRAST (5MM)  Result Date: 08/17/2022 CLINICAL DATA:  Fall EXAM: CT HEAD WITHOUT CONTRAST TECHNIQUE: Contiguous axial images were obtained from the base of the skull through the vertex without intravenous contrast. RADIATION DOSE REDUCTION: This exam was performed according to the departmental dose-optimization program which includes automated exposure control,  adjustment of the mA and/or kV according to patient size and/or use of iterative reconstruction technique. COMPARISON:  None Available. FINDINGS: Brain: There is no mass, hemorrhage or extra-axial collection. The size and configuration of the ventricles and extra-axial CSF spaces are normal. The brain parenchyma is normal, without acute or chronic infarction. Vascular: No abnormal hyperdensity of the major intracranial arteries or dural venous sinuses. No intracranial atherosclerosis. Skull: The visualized skull base, calvarium and extracranial soft tissues are normal. Sinuses/Orbits: No fluid levels or advanced mucosal thickening of the visualized paranasal sinuses. No mastoid or middle ear effusion. The orbits are normal. IMPRESSION: No acute intracranial abnormality. Electronically Signed   By: Ulyses Jarred M.D.   On: 08/17/2022 19:05   DG Chest Portable 1 View  Result Date: 08/17/2022 CLINICAL DATA:  Weakness, orthostatic hypertension EXAM: PORTABLE CHEST 1 VIEW COMPARISON:  Chest x-rays dated 07/26/2022 and 10/05/2020. FINDINGS: Stable cardiomegaly. Lungs are clear. No pleural effusion or pneumothorax is seen. Osseous structures about the chest are unremarkable. IMPRESSION: No active disease. No evidence of pneumonia or pulmonary edema. Stable cardiomegaly. Electronically Signed   By: Franki Cabot M.D.   On: 08/17/2022 12:51   DG Chest Port 1 View  Result Date: 07/26/2022 CLINICAL DATA:  [id number] Syncope [id number] EXAM: PORTABLE CHEST 1 VIEW COMPARISON:  Chest x-ray 10/05/2020, CT chest 10/05/2020 FINDINGS: Stable enlarged cardiac silhouette. The heart and mediastinal contours are unchanged. Aortic calcification. No focal consolidation. No pulmonary edema. Nonspecific blunting of left costophrenic angle. Trace left pleural effusion not excluded. No right pleural effusion effusion. No pneumothorax. No acute osseous abnormality. IMPRESSION: 1. No active disease. 2. Trace left pleural effusion not excluded. 3.   Aortic Atherosclerosis (ICD10-I70.0). Electronically Signed   By: Iven Finn M.D.   On: 07/26/2022 02:27    Microbiology: No results found for this or any previous visit (from the past 240 hour(s)).   Labs: Basic Metabolic Panel: Recent Labs  Lab 08/17/22 1208 08/18/22 0522 08/19/22 0524 08/20/22 0425 08/21/22 0331  NA 137 140 139 138 140  K 2.8* 3.7 2.7* 3.3* 3.5  CL 101 104 103 106 107  CO2 23 31 32 28 30  GLUCOSE 93 87 88 83 84  BUN '15 15 14 15 14  '$ CREATININE 1.15 1.03 0.84 0.82 0.85  CALCIUM 7.7* 7.3* 7.0* 7.2* 7.3*  MG 1.7  --  1.6* 2.0  --   PHOS 2.8  --  3.6  --   --    Liver Function Tests: Recent Labs  Lab 08/17/22 1208  AST 31  ALT 16  ALKPHOS 88  BILITOT 0.8  PROT 5.3*  ALBUMIN 2.0*   No results for input(s): "LIPASE", "AMYLASE" in the last 168 hours. No results for input(s): "AMMONIA" in the last 168 hours. CBC: Recent Labs  Lab 08/17/22 1208 08/18/22 0522 08/19/22 0524 08/21/22 0331  WBC 14.3* 6.4 7.1 7.0  NEUTROABS 11.2*  --   --   --   HGB 11.8* 9.5* 9.4* 8.9*  HCT 37.5* 29.4* 28.9* 28.0*  MCV 85.4 84.0 84.0 85.9  PLT 290 212 190 189   Cardiac Enzymes: No results for input(s): "CKTOTAL", "CKMB", "CKMBINDEX", "TROPONINI" in the last 168 hours. BNP: BNP (last 3 results) Recent Labs    07/04/22 1537 08/17/22 1208  BNP 158.7* 138.3*    ProBNP (last 3 results) No results for input(s): "PROBNP" in the last 8760 hours.  CBG: No results for input(s): "GLUCAP" in the last 168 hours.     Signed:  Desma Maxim MD.  [facility] 08/21/2022, 9:11 AM

## 2022-08-21 NOTE — Consult Note (Signed)
   [facility] CM Inpatient Consult   08/21/2022  Ernest [name] 1946-04-03 [id number]  [place] Organization [ACO] Patient: [employer] Medicare   Primary Care Provider:  [name], MD from [facility] Liaison remote coverage review for patient admitted to [facility]    Patient was reviewed for transitioning to [facility] affiliated facility this patient can be followed by [place] [facility] RN with traditional Medicare and approved Medicare Advantage plans.    Plan:   Notify [facility] RN who can follow for any known or needs for transitional care needs for returning to post facility care coordination needs to return to community.  For questions or referrals, please contact:   [name], RN BSN [place]  [phone] business mobile phone Toll free office [phone]  *[place]  [phone] Fax number: [phone] [email or web address]

## 2022-08-21 NOTE — Progress Notes (Signed)
Report given to [facility], RN Amy. Assisted patient with getting dressed. EMS called to pick patient up.

## 2022-08-21 NOTE — TOC Transition Note (Signed)
Transition of Care [facility]) - CM/SW Discharge Note   Patient Details  Name: Ernest [name] MRN: [id number] Date of Birth: 1945/10/11  [facility]) CM/SW Contact:  Gerilyn Pilgrim, LCSW Phone Number: 08/21/2022, 9:50 AM   Clinical Narrative:   Pt has orders to discharge to [facility]. DC summary sent to facility. RN given number for report. Medical necessity printed to the unit. CSW will call [facility] to transport pt to facility.     Final next level of care: Skilled Nursing Facility Barriers to Discharge: Barriers Resolved   Patient Goals and CMS Choice CMS Medicare.gov Compare Post Acute Care list provided to:: Patient    Discharge Placement                Patient chooses bed at: [facility] Patient to be transferred to facility by: [facility]      Discharge Plan and Services Additional resources added to the After Visit Summary for                                       Social Determinants of Health (SDOH) Interventions SDOH Screenings   Food Insecurity: No Food Insecurity (07/26/2022)  Housing: Low Risk  (07/26/2022)  Transportation Needs: No Transportation Needs (07/26/2022)  Utilities: Not At Risk (07/26/2022)  Alcohol Screen: Low Risk  (06/21/2021)  Depression (PHQ2-9): Low Risk  (06/21/2021)  Financial Resource Strain: Low Risk  (06/21/2021)  Physical Activity: Inactive (06/21/2021)  Social Connections: Unknown (06/21/2021)  Stress: No Stress Concern Present (06/21/2021)  Tobacco Use: Low Risk  (08/17/2022)     Readmission Risk Interventions     No data to display

## 2022-08-21 NOTE — NC FL2 (Signed)
[place] LEVEL OF CARE FORM     IDENTIFICATION  Patient Name: Ernest [name] Birthdate: March 12, 1946 Sex: male Admission Date (Current Location): 08/17/2022  [place] and [place] Number:  Engineering geologist and Address:  [facility], [address], [place], [place] [address]      Provider Number: [id number]  Attending Physician Name and Address:  Gwynne Edinger, MD  Relative Name and Phone Number:       Current Level of Care: Hospital Recommended Level of Care: [place] Prior Approval Number:    Date Approved/Denied:   PASRR Number: [id number]  Discharge Plan: SNF    Current Diagnoses: Patient Active Problem List   Diagnosis Date Noted   Fall 08/17/2022   Orthostatic hypotension 08/17/2022   Leukocytosis 08/17/2022   Chronic diastolic CHF (congestive heart failure) ([place]) 08/17/2022   Near syncope 07/26/2022   Diarrhea 07/26/2022   Chronic anticoagulation 07/26/2022   Chronic venous stasis 07/26/2022   Hypokalemia 07/26/2022   Hypotension 07/26/2022   Hypomagnesemia 07/26/2022   Persistent atrial fibrillation ([place]) 07/14/2022   Atrial fibrillation with rapid ventricular response ([place]) 06/27/2022   Hypercoagulable state due to persistent atrial fibrillation ([place]) 06/27/2022   Chronic right-sided heart failure ([place]) 06/27/2022   Obesity hypoventilation syndrome ([place]) 06/27/2022   Essential hypertension 06/27/2022   Chronic respiratory failure with hypoxia ([place]) 05/22/2022   OSA (obstructive sleep apnea) 05/22/2022   History of colonic polyps    Polyp of transverse colon    Adenomatous polyp of cecum    Partial small bowel obstruction (HCC) 04/01/2021   Abdominal bloating 11/30/2020   Abdominal pain, epigastric    B12 deficiency anemia    GERD (gastroesophageal reflux disease)    Swollen lymph nodes 07/25/2020   SVT (supraventricular tachycardia) 06/25/2020   Obesity, Class III, BMI 40-49.9  (morbid obesity) ([place]) 06/25/2020   History of umbilical hernia repair [id number]    Orientation RESPIRATION BLADDER Height & Weight     Self, Time, Situation, Place  O2 (4L) Continent Weight: (P) 179 lb 7.3 oz (81.4 kg) Height:  '6\' 1"'$  (185.4 cm)  BEHAVIORAL SYMPTOMS/MOOD NEUROLOGICAL BOWEL NUTRITION STATUS      Incontinent Diet  AMBULATORY STATUS COMMUNICATION OF NEEDS Skin   Extensive Assist    (skin tear to left elbow mepilex PRN)                       Personal Care Assistance Level of Assistance    Bathing Assistance: Maximum assistance Feeding assistance: Limited assistance Dressing Assistance: Maximum assistance     Functional Limitations Info  Sight, Hearing, Speech Sight Info: Adequate Hearing Info: Impaired Speech Info: Adequate    SPECIAL CARE FACTORS FREQUENCY  PT (By licensed PT), OT (By licensed OT)     PT Frequency: 5 times a week OT Frequency: 5 times a week            Contractures Contractures Info: Not present    Additional Factors Info  Code Status Code Status Info: DNR             Current Medications (08/21/2022):  This is the current hospital active medication list Current Facility-Administered Medications  Medication Dose Route Frequency Provider Last Rate Last Admin   acetaminophen (TYLENOL) tablet 650 mg  650 mg Oral Q6H PRN Ivor Costa, MD       albuterol (PROVENTIL) (2.5 MG/3ML) 0.083% nebulizer solution 3 mL  3 mL Inhalation Q4H PRN Blaine Hamper,  Soledad Gerlach, MD       alum & mag hydroxide-simeth (MAALOX/MYLANTA) 200-200-20 MG/5ML suspension 30 mL  30 mL Oral Q6H PRN Sharen Hones, MD       amiodarone (PACERONE) tablet 200 mg  200 mg Oral Daily Sharen Hones, MD   200 mg at 08/21/22 0858   apixaban (ELIQUIS) tablet 5 mg  5 mg Oral BID Ivor Costa, MD   5 mg at 08/21/22 0858   dicyclomine (BENTYL) capsule 10 mg  10 mg Oral TID PRN Ivor Costa, MD   10 mg at 08/19/22 1705   diphenhydrAMINE (BENADRYL) injection 12.5 mg  12.5 mg Intravenous Q8H PRN Ivor Costa, MD   12.5 mg at 08/18/22 0957   fludrocortisone (FLORINEF) tablet 0.2 mg  0.2 mg Oral Daily Ivor Costa, MD   0.2 mg at 08/21/22 [id number]   loperamide (IMODIUM) capsule 2 mg  2 mg Oral PRN Ivor Costa, MD       midodrine (PROAMATINE) tablet 15 mg  15 mg Oral TID Renella Cunas, MD   15 mg at 08/21/22 [id number]   multivitamin with minerals tablet 1 tablet  1 tablet Oral Daily Ivor Costa, MD   1 tablet at 08/21/22 0858   pantoprazole (PROTONIX) EC tablet 40 mg  40 mg Oral Once per day on Mon Wed Fri Zhang, Dekui, MD   40 mg at 08/20/22 0844   traMADol (ULTRAM) tablet 50 mg  50 mg Oral Q6H PRN Ivor Costa, MD         Discharge Medications: Please see discharge summary for a list of discharge medications.  Relevant Imaging Results:  Relevant Lab Results:   Additional Information SS- [id number]  Gerilyn Pilgrim, LCSW

## 2022-08-22 DIAGNOSIS — I10 Essential (primary) hypertension: Secondary | ICD-10-CM | POA: Diagnosis not present

## 2022-08-22 DIAGNOSIS — K219 Gastro-esophageal reflux disease without esophagitis: Secondary | ICD-10-CM | POA: Diagnosis not present

## 2022-08-22 DIAGNOSIS — K638219 Small intestinal bacterial overgrowth, unspecified: Secondary | ICD-10-CM | POA: Diagnosis not present

## 2022-08-22 DIAGNOSIS — I951 Orthostatic hypotension: Secondary | ICD-10-CM | POA: Diagnosis not present

## 2022-08-22 DIAGNOSIS — D519 Vitamin B12 deficiency anemia, unspecified: Secondary | ICD-10-CM | POA: Diagnosis not present

## 2022-08-22 DIAGNOSIS — K580 Irritable bowel syndrome with diarrhea: Secondary | ICD-10-CM | POA: Diagnosis not present

## 2022-08-22 DIAGNOSIS — M6281 Muscle weakness (generalized): Secondary | ICD-10-CM | POA: Diagnosis not present

## 2022-08-22 DIAGNOSIS — R296 Repeated falls: Secondary | ICD-10-CM | POA: Diagnosis not present

## 2022-08-22 DIAGNOSIS — I4819 Other persistent atrial fibrillation: Secondary | ICD-10-CM | POA: Diagnosis not present

## 2022-08-22 DIAGNOSIS — M150 Primary generalized (osteo)arthritis: Secondary | ICD-10-CM | POA: Diagnosis not present

## 2022-08-25 ENCOUNTER — Inpatient Hospital Stay: Payer: Medicare Other

## 2022-08-25 ENCOUNTER — Encounter: Payer: Self-pay | Admitting: Internal Medicine

## 2022-08-25 ENCOUNTER — Inpatient Hospital Stay: Payer: Medicare Other | Attending: Internal Medicine | Admitting: Internal Medicine

## 2022-08-25 ENCOUNTER — Telehealth: Payer: Self-pay

## 2022-08-25 VITALS — BP 88/60 | HR 84 | Temp 97.8°F | Resp 20 | Wt 308.0 lb

## 2022-08-25 DIAGNOSIS — E611 Iron deficiency: Secondary | ICD-10-CM | POA: Diagnosis not present

## 2022-08-25 DIAGNOSIS — G4733 Obstructive sleep apnea (adult) (pediatric): Secondary | ICD-10-CM

## 2022-08-25 DIAGNOSIS — M549 Dorsalgia, unspecified: Secondary | ICD-10-CM

## 2022-08-25 DIAGNOSIS — M2550 Pain in unspecified joint: Secondary | ICD-10-CM

## 2022-08-25 DIAGNOSIS — E669 Obesity, unspecified: Secondary | ICD-10-CM | POA: Diagnosis not present

## 2022-08-25 DIAGNOSIS — E876 Hypokalemia: Secondary | ICD-10-CM | POA: Diagnosis not present

## 2022-08-25 DIAGNOSIS — K921 Melena: Secondary | ICD-10-CM

## 2022-08-25 DIAGNOSIS — I5030 Unspecified diastolic (congestive) heart failure: Secondary | ICD-10-CM

## 2022-08-25 DIAGNOSIS — Z9981 Dependence on supplemental oxygen: Secondary | ICD-10-CM | POA: Diagnosis not present

## 2022-08-25 DIAGNOSIS — I951 Orthostatic hypotension: Secondary | ICD-10-CM | POA: Diagnosis not present

## 2022-08-25 DIAGNOSIS — K638219 Small intestinal bacterial overgrowth, unspecified: Secondary | ICD-10-CM | POA: Diagnosis not present

## 2022-08-25 DIAGNOSIS — I110 Hypertensive heart disease with heart failure: Secondary | ICD-10-CM

## 2022-08-25 DIAGNOSIS — Z79899 Other long term (current) drug therapy: Secondary | ICD-10-CM | POA: Diagnosis not present

## 2022-08-25 DIAGNOSIS — M7989 Other specified soft tissue disorders: Secondary | ICD-10-CM

## 2022-08-25 DIAGNOSIS — I4819 Other persistent atrial fibrillation: Secondary | ICD-10-CM | POA: Diagnosis not present

## 2022-08-25 DIAGNOSIS — Z7901 Long term (current) use of anticoagulants: Secondary | ICD-10-CM | POA: Diagnosis not present

## 2022-08-25 DIAGNOSIS — Z800 Family history of malignant neoplasm of digestive organs: Secondary | ICD-10-CM

## 2022-08-25 DIAGNOSIS — J9610 Chronic respiratory failure, unspecified whether with hypoxia or hypercapnia: Secondary | ICD-10-CM | POA: Diagnosis not present

## 2022-08-25 DIAGNOSIS — R0602 Shortness of breath: Secondary | ICD-10-CM | POA: Insufficient documentation

## 2022-08-25 DIAGNOSIS — K909 Intestinal malabsorption, unspecified: Secondary | ICD-10-CM | POA: Diagnosis not present

## 2022-08-25 DIAGNOSIS — Z993 Dependence on wheelchair: Secondary | ICD-10-CM

## 2022-08-25 DIAGNOSIS — D649 Anemia, unspecified: Secondary | ICD-10-CM | POA: Diagnosis not present

## 2022-08-25 DIAGNOSIS — Z8269 Family history of other diseases of the musculoskeletal system and connective tissue: Secondary | ICD-10-CM

## 2022-08-25 DIAGNOSIS — Z8719 Personal history of other diseases of the digestive system: Secondary | ICD-10-CM

## 2022-08-25 DIAGNOSIS — R5383 Other fatigue: Secondary | ICD-10-CM

## 2022-08-25 LAB — CBC WITH DIFFERENTIAL/PLATELET
Abs Immature Granulocytes: 0.18 10*3/uL — ABNORMAL HIGH (ref 0.00–0.07)
Basophils Absolute: 0 10*3/uL (ref 0.0–0.1)
Basophils Relative: 0 %
Eosinophils Absolute: 0 10*3/uL (ref 0.0–0.5)
Eosinophils Relative: 0 %
HCT: 35.4 % — ABNORMAL LOW (ref 39.0–52.0)
Hemoglobin: 11.5 g/dL — ABNORMAL LOW (ref 13.0–17.0)
Immature Granulocytes: 1 %
Lymphocytes Relative: 24 %
Lymphs Abs: 3.1 10*3/uL (ref 0.7–4.0)
MCH: 27.4 pg (ref 26.0–34.0)
MCHC: 32.5 g/dL (ref 30.0–36.0)
MCV: 84.3 fL (ref 80.0–100.0)
Monocytes Absolute: 1.1 10*3/uL — ABNORMAL HIGH (ref 0.1–1.0)
Monocytes Relative: 9 %
Neutro Abs: 8.3 10*3/uL — ABNORMAL HIGH (ref 1.7–7.7)
Neutrophils Relative %: 66 %
Platelets: 300 10*3/uL (ref 150–400)
RBC: 4.2 MIL/uL — ABNORMAL LOW (ref 4.22–5.81)
RDW: 17.3 % — ABNORMAL HIGH (ref 11.5–15.5)
WBC: 12.7 10*3/uL — ABNORMAL HIGH (ref 4.0–10.5)
nRBC: 0 % (ref 0.0–0.2)

## 2022-08-25 LAB — COMPREHENSIVE METABOLIC PANEL
ALT: 17 U/L (ref 0–44)
AST: 23 U/L (ref 15–41)
Albumin: 2.1 g/dL — ABNORMAL LOW (ref 3.5–5.0)
Alkaline Phosphatase: 91 U/L (ref 38–126)
Anion gap: 8 (ref 5–15)
BUN: 14 mg/dL (ref 8–23)
CO2: 28 mmol/L (ref 22–32)
Calcium: 7.4 mg/dL — ABNORMAL LOW (ref 8.9–10.3)
Chloride: 101 mmol/L (ref 98–111)
Creatinine, Ser: 1.12 mg/dL (ref 0.61–1.24)
GFR, Estimated: >60 mL/min (ref >60)
Glucose, Bld: 95 mg/dL (ref 70–99)
Potassium: 2.7 mmol/L — CL (ref 3.5–5.1)
Sodium: 137 mmol/L (ref 135–145)
Total Bilirubin: 0.8 mg/dL (ref 0.3–1.2)
Total Protein: 5.3 g/dL — ABNORMAL LOW (ref 6.5–8.1)

## 2022-08-25 LAB — VITAMIN B12: Vitamin B-12: 1142 pg/mL — ABNORMAL HIGH (ref 180–914)

## 2022-08-25 LAB — FOLATE: Folate: 7.6 ng/mL (ref >5.9)

## 2022-08-25 LAB — RETICULOCYTES
Immature Retic Fract: 7 % (ref 2.3–15.9)
RBC.: 4.2 MIL/uL — ABNORMAL LOW (ref 4.22–5.81)
Retic Count, Absolute: 78.1 10*3/uL (ref 19.0–186.0)
Retic Ct Pct: 1.9 % (ref 0.4–3.1)

## 2022-08-25 LAB — IRON AND TIBC
Iron: 44 ug/dL — ABNORMAL LOW (ref 45–182)
Saturation Ratios: 44 % — ABNORMAL HIGH (ref 17.9–39.5)
TIBC: 99 ug/dL — ABNORMAL LOW (ref 250–450)
UIBC: 55 ug/dL

## 2022-08-25 LAB — SAMPLE TO BLOOD BANK

## 2022-08-25 LAB — FERRITIN: Ferritin: 207 ng/mL (ref 24–336)

## 2022-08-25 LAB — ABO/RH: ABO/RH(D): O POS

## 2022-08-25 LAB — LACTATE DEHYDROGENASE: LDH: 128 U/L (ref 98–192)

## 2022-08-25 MED ORDER — OYSTER SHELL CALCIUM/D3 500-5 MG-MCG PO TABS: 2.0000 | ORAL_TABLET | Freq: Two times a day (BID) | ORAL | 0 refills | Status: DC

## 2022-08-25 MED ORDER — POTASSIUM CHLORIDE CRYS ER 20 MEQ PO TBCR: 20.0000 meq | EXTENDED_RELEASE_TABLET | Freq: Two times a day (BID) | ORAL | 0 refills | Status: DC

## 2022-08-25 NOTE — Telephone Encounter (Signed)
Levada Dy at [facility] made aware of K+ 2.7 result today.  Patient is currently taking Potassium 20 MEQ on Mon, Wed, Fri.  Dr. B wants patient to take Potassium 20 MEQ BID, #60 and start Calcium 500 +D BID.  Orders and prescriptions faxed to [facility] Levada Dy to [phone]

## 2022-08-25 NOTE — Assessment & Plan Note (Addendum)
#   FEB 2024- Acute anemia hemoglobin 8-9 [Jan 2024-normal]; normocytic  normal white count platelets.  On admission noted to have low B12 [140].  Interestingly patient's-hemoglobin was normal earlier [facility] 2024.   # I had a long discussion with patient regarding possible etiologies of iron deficiency including-including but not limited to blood loss versus lack of absorption [SIBO]; primary bone marrow problems versus others.  Recommend labs CBC CMP LDH myeloma panel reticulocyte count review of smear erythropoietin levels LDH haptoglobin.  Discussed regarding possible iron infusion blood work/also discussed regarding B12 injections.   # #Etiology of iron deficiency:?  Malabsorption secondary to- SIBO [Dr.vanga] status post GI evaluation-EGD colonoscopy- see below  # SEP 2023-[Dilateed Small bowels] 2.8 cm low-density lesion in the uncinate process of the pancreas is not substantially changed in the interval and reportedly stable back to 2021. There is no associated dilatation of the main pancreatic duct.  Defer to PCP for annual surveillaince.   # Cardiology : History of CHF history of A-fib on Eliquis [started jan 2024; [place]] amiodarone-history of hemorrhoidal bleeding.  Monitor closely on Eliquis/anemia.  # Chronic respiratory failure pulmonary[Dr.Gonzalez] on home O2 stable.  # Orthostasis midodrine/Florinef- [as per cardiology]-overall stable.  Monitor for electrolytes.  #  SIBO [hx of hernia operation; ? 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 NEGATIVE FOR HIGH-GRADE DYSPLASIA AND MALIGNANCY.  Defer to GI/PCP for further recommendations.  # I also discussed t the possible need for blood transfusion if hemoglobin less than 8.  And I also the potential risk of transfusion including but not limited to risk of infusion reactions; transmission of infections amongst others.  However the risk is extremely small; and  the benefits of the infusion overweighs the risk.  Patient agreement to proceed with transfusion if needed.  Check ABO/Rh; hold tube.   Thank you Dr.Wouk  for allowing me to participate in the care of your pleasant patient. Please do not hesitate to contact me with questions or concerns in the interim.  # DISPOSITION: # labs today- ordered # follow up next week- MD; No labs; possible venofer/ B12 injection- Dr.B  Addendum potassium 2.7-likely secondary Lasix/Florinef-recommend K-Dur twice a day.  Staff informed patient.  Prescription sent.

## 2022-08-25 NOTE — Progress Notes (Signed)
[place] NOTE  Patient Care Team: [name], MD as PCP - General (Internal Medicine) Kate Sable, MD as PCP - Cardiology (Cardiology)  CHIEF COMPLAINTS/PURPOSE OF CONSULTATION: ANEMIA   HEMATOLOGY HISTORY  # ANEMIA[Hb; MCV-platelets- WBC; Iron sat; ferritin;  GFR- CT/US; EGD/colonoscopy-  HISTORY OF PRESENTING ILLNESS: In a wheelchair-obese on home O2.  Accompanied by his wife. Ernest [name] 77 y.o.  male pleasant patient with multiple medical problems including A-fib on Eliquis orthostatic hypotension diastolic heart failure/chronic respiratory failure/OSA; Hx of SIBO   was been referred to [place] for further evaluation of anemia.  Patient was recently evaluated at another hospital earlier last month for orthostatic hypotension/syncope. At discharge, Midodrine was increased to 15 mg 3 times daily, Florinef increased to 20 mg daily and placed on abdominal binder and thigh-high compression stockings.  Patient was discharged to rehab.  Patient noted to have hemoglobin around 9 on admission to hospital.  Normocytic.  Previously earlier in the year his hemoglobin was interestingly normal.  B12 was low.  Started on injections.  Refer to hematology for further recommendations.  Blood in stools: Chronic hx of hemorrhoids once every 3  days. Colonoscopy- OCT 2023-large villous adenoma of the ileocecal region intact; EGD- 2021 [Dr.Vanga] Blood in urine:none Difficulty swallowing:none Prior blood transfusion:none Liver disease:none Alcohol: rare Bariatric surgery:none  Prior evaluation with hematology: none Prior bone marrow biopsy: none  Oral iron:MVT.  Prior IV iron infusions: none   Review of Systems  Constitutional:  Positive for malaise/fatigue. Negative for chills, diaphoresis, fever and weight loss.  HENT:  Negative for nosebleeds and sore throat.   Eyes:  Negative for double vision.  Respiratory:  Positive for shortness of breath. Negative  for cough, hemoptysis, sputum production and wheezing.   Cardiovascular:  Positive for leg swelling. Negative for chest pain, palpitations and orthopnea.  Gastrointestinal:  Negative for abdominal pain, blood in stool, constipation, diarrhea, heartburn, melena, nausea and vomiting.  Genitourinary:  Negative for dysuria, frequency and urgency.  Musculoskeletal:  Positive for back pain and joint pain.  Skin: Negative.  Negative for itching and rash.  Neurological:  Negative for dizziness, tingling, focal weakness, weakness and headaches.  Endo/Heme/Allergies:  Does not bruise/bleed easily.  Psychiatric/Behavioral:  Negative for depression. The patient is not nervous/anxious and does not have insomnia.    MEDICAL HISTORY:  Past Medical History:  Diagnosis Date   Anemia    Arthritis    osteoarthritis - bilateral knees   Chronic venous insufficiency of lower extremity    Diastolic dysfunction    a. 12/2020 Echo: EF 50-55%, GrI DD; b. 06/2022 Echo: EF 60-65%, no rwma, nl RV fxn, nl LA size, mildly dil RA.   Dyspnea    Essential hypertension    GERD (gastroesophageal reflux disease)    Hypotension    a. 06/2022 midodrine added.   Obesity hypoventilation syndrome (HCC)    Persistent atrial fibrillation ([place])    a. Dx 06/2022-->amio/eliquis. CHA2DS2VASc = 3.   Sleep apnea    Small bowel obstruction ([place]) 2022   Uses roller walker     SURGICAL HISTORY: Past Surgical History:  Procedure Laterality Date   CARDIOVERSION N/A 07/28/2022   Procedure: CARDIOVERSION;  Surgeon: Wellington Hampshire, MD;  Location: [facility];  Service: Cardiovascular;  Laterality: N/A;   CATARACT EXTRACTION W/PHACO Left 08/06/2020   Procedure: CATARACT EXTRACTION PHACO AND INTRAOCULAR LENS PLACEMENT ([place]) LEFT;  Surgeon: Eulogio Bear, MD;  Location: [facility];  Service: Ophthalmology;  Laterality: Left;  3.41 0:31.5   CATARACT EXTRACTION W/PHACO Right 08/27/2020   Procedure: CATARACT EXTRACTION PHACO AND  INTRAOCULAR LENS PLACEMENT ([facility]) RIGHT 4.10 00:41.6;  Surgeon: Eulogio Bear, MD;  Location: [place];  Service: Ophthalmology;  Laterality: Right;   COLONOSCOPY WITH ESOPHAGOGASTRODUODENOSCOPY (EGD)     COLONOSCOPY WITH PROPOFOL N/A 11/01/2020   Procedure: COLONOSCOPY WITH PROPOFOL;  Surgeon: Lin Landsman, MD;  [facility] ENDOSCOPY;  Service: Gastroenterology;  Laterality: N/A;   COLONOSCOPY WITH PROPOFOL N/A 03/18/2022   Procedure: COLONOSCOPY WITH BIOPSIES;  Surgeon: Lin Landsman, MD;  Location: [facility];  Service: Endoscopy;  Laterality: N/A;   ESOPHAGOGASTRODUODENOSCOPY (EGD) WITH PROPOFOL N/A 11/01/2020   Procedure: ESOPHAGOGASTRODUODENOSCOPY (EGD) WITH PROPOFOL;  Surgeon: Lin Landsman, MD;  Location: [facility] ENDOSCOPY;  Service: Gastroenterology;  Laterality: N/A;   HERNIA REPAIR     inguinal and umbilical   POLYPECTOMY  03/18/2022   Procedure: POLYPECTOMY;  Surgeon: Lin Landsman, MD;  Location: [facility];  Service: Endoscopy;;    SOCIAL HISTORY: Social History   Socioeconomic History   Marital status: Married    Spouse name: Not on file   Number of children: Not on file   Years of education: Not on file   Highest education level: Not on file  Occupational History   Not on file  Tobacco Use   Smoking status: Never   Smokeless tobacco: Never   Tobacco comments:    Never smoke 07/14/22  Substance and Sexual Activity   Alcohol use: Yes    Alcohol/week: 0.0 standard drinks of alcohol    Comment: rarely   Drug use: Not Currently   Sexual activity: Not on file  Other Topics Concern   Not on file  Social History Narrative   Lives locally with wife.  Does not routinely exercise.   Social Determinants of Health   Financial Resource Strain: Low Risk  (06/21/2021)   Overall Financial Resource Strain (CARDIA)    Difficulty of Paying Living Expenses: Not hard at all  Food Insecurity: No Food Insecurity (08/25/2022)    Hunger Vital Sign    Worried About Running Out of Food in the Last Year: Never true    Ran Out of Food in the Last Year: Never true  Transportation Needs: No Transportation Needs (08/25/2022)   PRAPARE - Hydrologist (Medical): No    Lack of Transportation (Non-Medical): No  Physical Activity: Inactive (06/21/2021)   Exercise Vital Sign    Days of Exercise per Week: 0 days    Minutes of Exercise per Session: 0 min  Stress: No Stress Concern Present (06/21/2021)   [place]    Feeling of Stress : Not at all  Social Connections: Unknown (06/21/2021)   Social Connection and Isolation Panel [NHANES]    Frequency of Communication with Friends and Family: Twice a week    Frequency of Social Gatherings with Friends and Family: Never    Attends Religious Services: Patient refused    Marine scientist or Organizations: Patient refused    Attends Archivist Meetings: Patient refused    Marital Status: Married  Human resources officer Violence: Not At Risk (08/25/2022)   Humiliation, Afraid, Rape, and Kick questionnaire    Fear of Current or Ex-Partner: No    Emotionally Abused: No    Physically Abused: No    Sexually Abused: No    FAMILY HISTORY: Family History  Problem Relation Age of Onset   Esophageal cancer Father    Fibromyalgia Sister     ALLERGIES:  has No Known Allergies.  MEDICATIONS:  Current Outpatient Medications  Medication Sig Dispense Refill   acetaminophen (TYLENOL) 325 MG tablet Take 325 mg by mouth every 8 (eight) hours as needed for moderate pain.     amiodarone (PACERONE) 200 MG tablet TAKE 1 TABLET BY MOUTH TWICE A DAY FOR 30 DAYS. THEN TAKE 1 TABLET EVERY DAY (Patient taking differently: Take 200 mg by mouth daily.) 90 tablet 0   apixaban (ELIQUIS) 5 MG TABS tablet Take 1 tablet (5 mg total) by mouth 2 (two) times daily. 180 tablet 3   cyanocobalamin (VITAMIN  B12) 1000 MCG tablet Take 1 tablet (1,000 mcg total) by mouth daily. 30 tablet 1   dicyclomine (BENTYL) 10 MG capsule Take 1 capsule (10 mg total) by mouth 3 (three) times daily as needed for spasms.     fludrocortisone (FLORINEF) 0.1 MG tablet Take 2 tablets (0.2 mg total) by mouth daily.     furosemide (LASIX) 40 MG tablet Take 1 tablet (40 mg total) by mouth 3 (three) times a week. Take one tablet ('40mg'$ ) three times a week. Mon/Wed/Fri     loperamide (IMODIUM) 2 MG capsule Take 1 capsule (2 mg total) by mouth as needed for diarrhea or loose stools. 30 capsule 0   midodrine (PROAMATINE) 10 MG tablet Take 1.5 tablets (15 mg total) by mouth 3 (three) times daily. 90 tablet 3   Multiple Vitamins-Minerals (CENTRUM SILVER 50+MEN PO) Take 1 tablet by mouth in the morning.     nystatin (MYCOSTATIN/NYSTOP) powder Apply 1 Application topically 3 (three) times daily. 60 g 6   potassium chloride SA (KLOR-CON M) 20 MEQ tablet Take 1 tablet (20 mEq total) by mouth 3 (three) times a week. (Patient taking differently: Take 20 mEq by mouth 3 (three) times a week. 8am, NOON, 8pm) 13 tablet 3   traMADol (ULTRAM) 50 MG tablet Take 1 tablet (50 mg total) by mouth every 6 (six) hours as needed for moderate pain. 30 tablet 0   pantoprazole (PROTONIX) 40 MG tablet Take 1 tablet (40 mg total) by mouth 3 (three) times a week. Takes 40 mg po Monday, Wednesday, Friday (Patient not taking: Reported on 08/25/2022)     No current facility-administered medications for this visit.     PHYSICAL EXAMINATION:   Vitals:   08/25/22 1100  BP: (!) 88/60  Pulse: 84  Resp: 20  Temp: 97.8 F (36.6 C)   Filed Weights   08/25/22 1100  Weight: (!) 308 lb (139.7 kg)    Physical Exam Vitals and nursing note reviewed.  HENT:     Head: Normocephalic and atraumatic.     Mouth/Throat:     Pharynx: Oropharynx is clear.  Eyes:     Extraocular Movements: Extraocular movements intact.     Pupils: Pupils are equal, round, and  reactive to light.  Cardiovascular:     Rate and Rhythm: Normal rate and regular rhythm.  Pulmonary:     Comments: Decreased breath sounds bilaterally.  Abdominal:     Palpations: Abdomen is soft.  Musculoskeletal:        General: Normal range of motion.     Cervical back: Normal range of motion.  Skin:    General: Skin is warm.  Neurological:     General: No focal deficit present.     Mental Status: He is alert and oriented to  person, place, and time.  Psychiatric:        Behavior: Behavior normal.        Judgment: Judgment normal.      LABORATORY DATA:  I have reviewed the data as listed Lab Results  Component Value Date   WBC 12.7 (H) 08/25/2022   HGB 11.5 (L) 08/25/2022   HCT 35.4 (L) 08/25/2022   MCV 84.3 08/25/2022   PLT 300 08/25/2022   Recent Labs    07/25/22 2101 07/26/22 1100 08/17/22 1208 08/18/22 0522 08/20/22 0425 08/21/22 0331 08/25/22 1237  NA 136   < > 137   < > 138 140 137  K 3.2*   < > 2.8*   < > 3.3* 3.5 2.7*  CL 93*   < > 101   < > 106 107 101  CO2 31   < > 23   < > '28 30 28  '$ GLUCOSE 104*   < > 93   < > 83 84 95  BUN 17   < > 15   < > '15 14 14  '$ CREATININE 1.08   < > 1.15   < > 0.82 0.85 1.12  CALCIUM 7.8*   < > 7.7*   < > 7.2* 7.3* 7.4*  GFRNONAA >60   < > >60   < > >60 >60 >60  PROT 5.8*  --  5.3*  --   --   --  5.3*  ALBUMIN 2.4*  --  2.0*  --   --   --  2.1*  AST 19  --  31  --   --   --  23  ALT 9  --  16  --   --   --  17  ALKPHOS 71  --  88  --   --   --  91  BILITOT 1.3*  --  0.8  --   --   --  0.8   < > = values in this interval not displayed.     CT HEAD WO CONTRAST (5MM)  Result Date: 08/17/2022 CLINICAL DATA:  [place] EXAM: CT HEAD WITHOUT CONTRAST TECHNIQUE: Contiguous axial images were obtained from the base of the skull through the vertex without intravenous contrast. RADIATION DOSE REDUCTION: This exam was performed according to the departmental dose-optimization program which includes automated exposure control, adjustment  of the mA and/or kV according to patient size and/or use of iterative reconstruction technique. COMPARISON:  None Available. FINDINGS: Brain: There is no mass, hemorrhage or extra-axial collection. The size and configuration of the ventricles and extra-axial CSF spaces are normal. The brain parenchyma is normal, without acute or chronic infarction. Vascular: No abnormal hyperdensity of the major intracranial arteries or dural venous sinuses. No intracranial atherosclerosis. Skull: The visualized skull base, calvarium and extracranial soft tissues are normal. Sinuses/Orbits: No fluid levels or advanced mucosal thickening of the visualized paranasal sinuses. No mastoid or middle ear effusion. The orbits are normal. IMPRESSION: No acute intracranial abnormality. Electronically Signed   By: Ulyses Jarred M.D.   On: 08/17/2022 19:05   DG Chest Portable 1 View  Result Date: 08/17/2022 CLINICAL DATA:  Weakness, orthostatic hypertension EXAM: PORTABLE CHEST 1 VIEW COMPARISON:  Chest x-rays dated 07/26/2022 and 10/05/2020. FINDINGS: Stable cardiomegaly. Lungs are clear. No pleural effusion or pneumothorax is seen. Osseous structures about the chest are unremarkable. IMPRESSION: No active disease. No evidence of pneumonia or pulmonary edema. Stable cardiomegaly. Electronically Signed   By: Roxy Horseman.D.  On: 08/17/2022 12:51    ASSESSMENT & PLAN:   Symptomatic anemia # FEB 2024- Acute anemia hemoglobin 8-9 [Jan 2024-normal]; normocytic  normal white count platelets.  On admission noted to have low B12 [140].  Interestingly patient's-hemoglobin was normal earlier [facility] 2024.   # I had a long discussion with patient regarding possible etiologies of iron deficiency including-including but not limited to blood loss versus lack of absorption [SIBO]; primary bone marrow problems versus others.  Recommend labs CBC CMP LDH myeloma panel reticulocyte count review of smear erythropoietin levels LDH haptoglobin.   Discussed regarding possible iron infusion blood work/also discussed regarding B12 injections.   # #Etiology of iron deficiency:?  Malabsorption secondary to- SIBO [Dr.vanga] status post GI evaluation-EGD colonoscopy- see below  # SEP 2023-[Dilateed Small bowels] 2.8 cm low-density lesion in the uncinate process of the pancreas is not substantially changed in the interval and reportedly stable back to 2021. There is no associated dilatation of the main pancreatic duct.  Defer to PCP for annual surveillaince.   # Cardiology : History of CHF history of A-fib on Eliquis [started jan 2024; [place]] amiodarone-history of hemorrhoidal bleeding.  Monitor closely on Eliquis/anemia.  # Chronic respiratory failure pulmonary[Dr.Gonzalez] on home O2 stable.  # Orthostasis midodrine/Florinef- [as per cardiology]-overall stable.  Monitor for electrolytes.  #  SIBO [hx of hernia operation; ? 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 NEGATIVE FOR HIGH-GRADE DYSPLASIA AND MALIGNANCY.  Defer to GI/PCP for further recommendations.  # I also discussed t the possible need for blood transfusion if hemoglobin less than 8.  And I also the potential risk of transfusion including but not limited to risk of infusion reactions; transmission of infections amongst others.  However the risk is extremely small; and the benefits of the infusion overweighs the risk.  Patient agreement to proceed with transfusion if needed.  Check ABO/Rh; hold tube.   Thank you Dr.Wouk  for allowing me to participate in the care of your pleasant patient. Please do not hesitate to contact me with questions or concerns in the interim.  # DISPOSITION: # labs today- ordered # follow up next week- MD; No labs; possible venofer/ B12 injection- Dr.B  Addendum potassium 2.7-likely secondary Lasix/Florinef-recommend K-Dur twice a day.  Staff informed patient.  Prescription  sent.   All questions were answered. The patient knows to call the clinic with any problems, questions or concerns.  Cammie Sickle, MD 08/25/2022 2:35 PM

## 2022-08-25 NOTE — Progress Notes (Signed)
New patient evaluation.  Concerned with increasing SOBr.

## 2022-08-26 ENCOUNTER — Encounter: Payer: Self-pay | Admitting: Internal Medicine

## 2022-08-26 DIAGNOSIS — I951 Orthostatic hypotension: Secondary | ICD-10-CM | POA: Diagnosis not present

## 2022-08-26 DIAGNOSIS — M150 Primary generalized (osteo)arthritis: Secondary | ICD-10-CM | POA: Diagnosis not present

## 2022-08-26 DIAGNOSIS — K580 Irritable bowel syndrome with diarrhea: Secondary | ICD-10-CM | POA: Diagnosis not present

## 2022-08-26 DIAGNOSIS — K219 Gastro-esophageal reflux disease without esophagitis: Secondary | ICD-10-CM | POA: Diagnosis not present

## 2022-08-26 DIAGNOSIS — I4819 Other persistent atrial fibrillation: Secondary | ICD-10-CM | POA: Diagnosis not present

## 2022-08-26 DIAGNOSIS — D519 Vitamin B12 deficiency anemia, unspecified: Secondary | ICD-10-CM | POA: Diagnosis not present

## 2022-08-26 DIAGNOSIS — R296 Repeated falls: Secondary | ICD-10-CM | POA: Diagnosis not present

## 2022-08-26 LAB — KAPPA/LAMBDA LIGHT CHAINS
Kappa free light chain: 44.5 mg/L — ABNORMAL HIGH (ref 3.3–19.4)
Kappa, lambda light chain ratio: 0.93 (ref 0.26–1.65)
Lambda free light chains: 47.9 mg/L — ABNORMAL HIGH (ref 5.7–26.3)

## 2022-08-26 LAB — HAPTOGLOBIN: Haptoglobin: 197 mg/dL (ref 34–355)

## 2022-08-26 LAB — ERYTHROPOIETIN: Erythropoietin: 15.3 m[IU]/mL (ref 2.6–18.5)

## 2022-08-28 DIAGNOSIS — E876 Hypokalemia: Secondary | ICD-10-CM | POA: Diagnosis not present

## 2022-08-29 DIAGNOSIS — R296 Repeated falls: Secondary | ICD-10-CM | POA: Diagnosis not present

## 2022-08-29 DIAGNOSIS — K219 Gastro-esophageal reflux disease without esophagitis: Secondary | ICD-10-CM | POA: Diagnosis not present

## 2022-08-29 DIAGNOSIS — K580 Irritable bowel syndrome with diarrhea: Secondary | ICD-10-CM | POA: Diagnosis not present

## 2022-08-29 DIAGNOSIS — M150 Primary generalized (osteo)arthritis: Secondary | ICD-10-CM | POA: Diagnosis not present

## 2022-08-29 DIAGNOSIS — I4819 Other persistent atrial fibrillation: Secondary | ICD-10-CM | POA: Diagnosis not present

## 2022-08-29 DIAGNOSIS — M6281 Muscle weakness (generalized): Secondary | ICD-10-CM | POA: Diagnosis not present

## 2022-08-29 DIAGNOSIS — D519 Vitamin B12 deficiency anemia, unspecified: Secondary | ICD-10-CM | POA: Diagnosis not present

## 2022-08-29 DIAGNOSIS — I951 Orthostatic hypotension: Secondary | ICD-10-CM | POA: Diagnosis not present

## 2022-08-29 DIAGNOSIS — K638219 Small intestinal bacterial overgrowth, unspecified: Secondary | ICD-10-CM | POA: Diagnosis not present

## 2022-08-29 MED FILL — Iron Sucrose Inj 20 MG/ML (Fe Equiv): INTRAVENOUS | Qty: 10 | Status: AC

## 2022-08-30 ENCOUNTER — Encounter: Payer: Self-pay | Admitting: Internal Medicine

## 2022-08-30 LAB — METHYLMALONIC ACID, SERUM: Methylmalonic Acid, Quantitative: 243 nmol/L (ref 0–378)

## 2022-08-31 DIAGNOSIS — I10 Essential (primary) hypertension: Secondary | ICD-10-CM | POA: Diagnosis not present

## 2022-09-01 ENCOUNTER — Inpatient Hospital Stay: Payer: Medicare Other | Admitting: Internal Medicine

## 2022-09-01 ENCOUNTER — Inpatient Hospital Stay: Payer: Medicare Other

## 2022-09-01 DIAGNOSIS — N390 Urinary tract infection, site not specified: Secondary | ICD-10-CM | POA: Diagnosis not present

## 2022-09-01 DIAGNOSIS — E876 Hypokalemia: Secondary | ICD-10-CM | POA: Diagnosis not present

## 2022-09-01 LAB — MULTIPLE MYELOMA PANEL, SERUM
Albumin SerPl Elph-Mcnc: 2.2 g/dL — ABNORMAL LOW (ref 2.9–4.4)
Albumin/Glob SerPl: 0.8 (ref 0.7–1.7)
Alpha 1: 0.2 g/dL (ref 0.0–0.4)
Alpha2 Glob SerPl Elph-Mcnc: 0.6 g/dL (ref 0.4–1.0)
B-Globulin SerPl Elph-Mcnc: 0.7 g/dL (ref 0.7–1.3)
Gamma Glob SerPl Elph-Mcnc: 1.3 g/dL (ref 0.4–1.8)
Globulin, Total: 2.9 g/dL (ref 2.2–3.9)
IgA: 491 mg/dL — ABNORMAL HIGH (ref 61–437)
IgG (Immunoglobin G), Serum: 1096 mg/dL (ref 603–1613)
IgM (Immunoglobulin M), Srm: 89 mg/dL (ref 15–143)
Total Protein ELP: 5.1 g/dL — ABNORMAL LOW (ref 6.0–8.5)

## 2022-09-03 ENCOUNTER — Inpatient Hospital Stay: Payer: Medicare Other

## 2022-09-03 ENCOUNTER — Encounter: Payer: Self-pay | Admitting: Internal Medicine

## 2022-09-03 ENCOUNTER — Inpatient Hospital Stay (HOSPITAL_BASED_OUTPATIENT_CLINIC_OR_DEPARTMENT_OTHER): Payer: Medicare Other | Admitting: Internal Medicine

## 2022-09-03 VITALS — BP 91/66 | HR 79 | Temp 97.7°F | Resp 18

## 2022-09-03 DIAGNOSIS — I951 Orthostatic hypotension: Secondary | ICD-10-CM | POA: Diagnosis not present

## 2022-09-03 DIAGNOSIS — D649 Anemia, unspecified: Secondary | ICD-10-CM | POA: Diagnosis not present

## 2022-09-03 DIAGNOSIS — M2550 Pain in unspecified joint: Secondary | ICD-10-CM | POA: Diagnosis not present

## 2022-09-03 DIAGNOSIS — G4733 Obstructive sleep apnea (adult) (pediatric): Secondary | ICD-10-CM | POA: Diagnosis not present

## 2022-09-03 DIAGNOSIS — M7989 Other specified soft tissue disorders: Secondary | ICD-10-CM | POA: Diagnosis not present

## 2022-09-03 DIAGNOSIS — M549 Dorsalgia, unspecified: Secondary | ICD-10-CM | POA: Diagnosis not present

## 2022-09-03 DIAGNOSIS — I5030 Unspecified diastolic (congestive) heart failure: Secondary | ICD-10-CM | POA: Diagnosis not present

## 2022-09-03 DIAGNOSIS — I4819 Other persistent atrial fibrillation: Secondary | ICD-10-CM | POA: Diagnosis not present

## 2022-09-03 DIAGNOSIS — J9610 Chronic respiratory failure, unspecified whether with hypoxia or hypercapnia: Secondary | ICD-10-CM | POA: Diagnosis not present

## 2022-09-03 DIAGNOSIS — E611 Iron deficiency: Secondary | ICD-10-CM | POA: Diagnosis not present

## 2022-09-03 DIAGNOSIS — Z993 Dependence on wheelchair: Secondary | ICD-10-CM | POA: Diagnosis not present

## 2022-09-03 DIAGNOSIS — K909 Intestinal malabsorption, unspecified: Secondary | ICD-10-CM | POA: Diagnosis not present

## 2022-09-03 DIAGNOSIS — Z8719 Personal history of other diseases of the digestive system: Secondary | ICD-10-CM | POA: Diagnosis not present

## 2022-09-03 DIAGNOSIS — Z7901 Long term (current) use of anticoagulants: Secondary | ICD-10-CM | POA: Diagnosis not present

## 2022-09-03 DIAGNOSIS — Z79899 Other long term (current) drug therapy: Secondary | ICD-10-CM | POA: Diagnosis not present

## 2022-09-03 DIAGNOSIS — R5383 Other fatigue: Secondary | ICD-10-CM | POA: Diagnosis not present

## 2022-09-03 DIAGNOSIS — K638219 Small intestinal bacterial overgrowth, unspecified: Secondary | ICD-10-CM | POA: Diagnosis not present

## 2022-09-03 DIAGNOSIS — R0602 Shortness of breath: Secondary | ICD-10-CM | POA: Diagnosis not present

## 2022-09-03 DIAGNOSIS — I110 Hypertensive heart disease with heart failure: Secondary | ICD-10-CM | POA: Diagnosis not present

## 2022-09-03 DIAGNOSIS — E876 Hypokalemia: Secondary | ICD-10-CM | POA: Diagnosis not present

## 2022-09-03 DIAGNOSIS — Z8269 Family history of other diseases of the musculoskeletal system and connective tissue: Secondary | ICD-10-CM | POA: Diagnosis not present

## 2022-09-03 DIAGNOSIS — K921 Melena: Secondary | ICD-10-CM | POA: Diagnosis not present

## 2022-09-03 DIAGNOSIS — Z9981 Dependence on supplemental oxygen: Secondary | ICD-10-CM | POA: Diagnosis not present

## 2022-09-03 NOTE — Progress Notes (Signed)
[place] NOTE  Patient Care Team: [name], MD as PCP - General (Internal Medicine) Kate Sable, MD as PCP - Cardiology (Cardiology)  CHIEF COMPLAINTS/PURPOSE OF CONSULTATION: ANEMIA   HEMATOLOGY HISTORY  # ANEMIA[Hb; MCV-platelets- WBC; Iron sat; ferritin;  GFR- CT/US; EGD/colonoscopy-  # diastolic heart failure/chronic respiratory failure/OSA; Hx of SIBO [Dr.Vanga]  Orthostatic hypotension -[s/p cards]- midodrine- 15 mg 3 times daily, Florinef -20 mg daily/abdominal binder /thigh-high compression stockings.  HISTORY OF PRESENTING ILLNESS: In a stretcher-obese on home O2.  Transported by EMS.  Accompanied by his wife.  Ernest [name] 77 y.o.  male pleasant patient with multiple medical problems including A-fib on Eliquis orthostatic hypotension diastolic heart failure/chronic respiratory failure/OSA; Hx of SIBO is here for follow-up anemia.  At last visit patient was noted to have severe hypokalemia started on potassium supplementation twice daily.  Also currently on B12 pills at the rehab.  Patient continues to be rehab, and is in the process of being transferred home.  Patient is concerned about his syncopal episodes/falls.     Review of Systems  Constitutional:  Positive for malaise/fatigue. Negative for chills, diaphoresis, fever and weight loss.  HENT:  Negative for nosebleeds and sore throat.   Eyes:  Negative for double vision.  Respiratory:  Positive for shortness of breath. Negative for cough, hemoptysis, sputum production and wheezing.   Cardiovascular:  Positive for leg swelling. Negative for chest pain, palpitations and orthopnea.  Gastrointestinal:  Negative for abdominal pain, blood in stool, constipation, diarrhea, heartburn, melena, nausea and vomiting.  Genitourinary:  Negative for dysuria, frequency and urgency.  Musculoskeletal:  Positive for back pain and joint pain.  Skin: Negative.  Negative for itching and rash.   Neurological:  Negative for dizziness, tingling, focal weakness, weakness and headaches.  Endo/Heme/Allergies:  Does not bruise/bleed easily.  Psychiatric/Behavioral:  Negative for depression. The patient is not nervous/anxious and does not have insomnia.    MEDICAL HISTORY:  Past Medical History:  Diagnosis Date   Anemia    Arthritis    osteoarthritis - bilateral knees   Chronic venous insufficiency of lower extremity    Diastolic dysfunction    a. 12/2020 Echo: EF 50-55%, GrI DD; b. 06/2022 Echo: EF 60-65%, no rwma, nl RV fxn, nl LA size, mildly dil RA.   Dyspnea    Essential hypertension    GERD (gastroesophageal reflux disease)    Hypotension    a. 06/2022 midodrine added.   Obesity hypoventilation syndrome (HCC)    Persistent atrial fibrillation ([place])    a. Dx 06/2022-->amio/eliquis. CHA2DS2VASc = 3.   Sleep apnea    Small bowel obstruction ([place]) 2022   Uses roller walker     SURGICAL HISTORY: Past Surgical History:  Procedure Laterality Date   CARDIOVERSION N/A 07/28/2022   Procedure: CARDIOVERSION;  Surgeon: Wellington Hampshire, MD;  Location: [facility];  Service: Cardiovascular;  Laterality: N/A;   CATARACT EXTRACTION W/PHACO Left 08/06/2020   Procedure: CATARACT EXTRACTION PHACO AND INTRAOCULAR LENS PLACEMENT ([place]) LEFT;  Surgeon: Eulogio Bear, MD;  Location: [place];  Service: Ophthalmology;  Laterality: Left;  3.41 0:31.5   CATARACT EXTRACTION W/PHACO Right 08/27/2020   Procedure: CATARACT EXTRACTION PHACO AND INTRAOCULAR LENS PLACEMENT ([facility]) RIGHT 4.10 00:41.6;  Surgeon: Eulogio Bear, MD;  Location: [place];  Service: Ophthalmology;  Laterality: Right;   COLONOSCOPY WITH ESOPHAGOGASTRODUODENOSCOPY (EGD)     COLONOSCOPY WITH PROPOFOL N/A 11/01/2020   Procedure: COLONOSCOPY WITH PROPOFOL;  Surgeon:  Lin Landsman, MD;  Location: [facility];  Service: Gastroenterology;  Laterality: N/A;   COLONOSCOPY WITH PROPOFOL N/A 03/18/2022    Procedure: COLONOSCOPY WITH BIOPSIES;  Surgeon: Lin Landsman, MD;  Location: [place];  Service: Endoscopy;  Laterality: N/A;   ESOPHAGOGASTRODUODENOSCOPY (EGD) WITH PROPOFOL N/A 11/01/2020   Procedure: ESOPHAGOGASTRODUODENOSCOPY (EGD) WITH PROPOFOL;  Surgeon: Lin Landsman, MD;  Location: [facility] ENDOSCOPY;  Service: Gastroenterology;  Laterality: N/A;   HERNIA REPAIR     inguinal and umbilical   POLYPECTOMY  03/18/2022   Procedure: POLYPECTOMY;  Surgeon: Lin Landsman, MD;  Location: [place];  Service: Endoscopy;;    SOCIAL HISTORY: Social History   Socioeconomic History   Marital status: Married    Spouse name: Not on file   Number of children: Not on file   Years of education: Not on file   Highest education level: Not on file  Occupational History   Not on file  Tobacco Use   Smoking status: Never   Smokeless tobacco: Never   Tobacco comments:    Never smoke 07/14/22  Substance and Sexual Activity   Alcohol use: Yes    Alcohol/week: 0.0 standard drinks of alcohol    Comment: rarely   Drug use: Not Currently   Sexual activity: Not on file  Other Topics Concern   Not on file  Social History Narrative   Lives locally with wife.  Does not routinely exercise.   Social Determinants of Health   Financial Resource Strain: Low Risk  (06/21/2021)   Overall Financial Resource Strain (CARDIA)    Difficulty of Paying Living Expenses: Not hard at all  Food Insecurity: No Food Insecurity (08/25/2022)   Hunger Vital Sign    Worried About Running Out of Food in the Last Year: Never true    Ran Out of Food in the Last Year: Never true  Transportation Needs: No Transportation Needs (08/25/2022)   PRAPARE - Hydrologist (Medical): No    Lack of Transportation (Non-Medical): No  Physical Activity: Inactive (06/21/2021)   Exercise Vital Sign    Days of Exercise per Week: 0 days    Minutes of Exercise per Session: 0 min   Stress: No Stress Concern Present (06/21/2021)   [place]    Feeling of Stress : Not at all  Social Connections: Unknown (06/21/2021)   Social Connection and Isolation Panel [NHANES]    Frequency of Communication with Friends and Family: Twice a week    Frequency of Social Gatherings with Friends and Family: Never    Attends Religious Services: Patient refused    Marine scientist or Organizations: Patient refused    Attends Archivist Meetings: Patient refused    Marital Status: Married  Human resources officer Violence: Not At Risk (08/25/2022)   Humiliation, Afraid, Rape, and Kick questionnaire    Fear of Current or Ex-Partner: No    Emotionally Abused: No    Physically Abused: No    Sexually Abused: No    FAMILY HISTORY: Family History  Problem Relation Age of Onset   Esophageal cancer Father    Fibromyalgia Sister     ALLERGIES:  has No Known Allergies.  MEDICATIONS:  Current Outpatient Medications  Medication Sig Dispense Refill   acetaminophen (TYLENOL) 325 MG tablet Take 325 mg by mouth every 8 (eight) hours as needed for moderate pain.     amiodarone (PACERONE) 200 MG  tablet TAKE 1 TABLET BY MOUTH TWICE A DAY FOR 30 DAYS. THEN TAKE 1 TABLET EVERY DAY (Patient taking differently: Take 200 mg by mouth daily.) 90 tablet 0   apixaban (ELIQUIS) 5 MG TABS tablet Take 1 tablet (5 mg total) by mouth 2 (two) times daily. 180 tablet 3   calcium-vitamin D (OSCAL WITH D) 500-5 MG-MCG tablet Take 2 tablets by mouth 2 (two) times daily. 60 tablet 0   cyanocobalamin (VITAMIN B12) 1000 MCG tablet Take 1 tablet (1,000 mcg total) by mouth daily. 30 tablet 1   dicyclomine (BENTYL) 10 MG capsule Take 1 capsule (10 mg total) by mouth 3 (three) times daily as needed for spasms.     fludrocortisone (FLORINEF) 0.1 MG tablet Take 2 tablets (0.2 mg total) by mouth daily.     furosemide (LASIX) 40 MG tablet Take 1 tablet  (40 mg total) by mouth 3 (three) times a week. Take one tablet ('40mg'$ ) three times a week. Mon/Wed/Fri     loperamide (IMODIUM) 2 MG capsule Take 1 capsule (2 mg total) by mouth as needed for diarrhea or loose stools. 30 capsule 0   midodrine (PROAMATINE) 10 MG tablet Take 1.5 tablets (15 mg total) by mouth 3 (three) times daily. 90 tablet 3   Multiple Vitamins-Minerals (CENTRUM SILVER 50+MEN PO) Take 1 tablet by mouth in the morning.     nystatin (MYCOSTATIN/NYSTOP) powder Apply 1 Application topically 3 (three) times daily. 60 g 6   pantoprazole (PROTONIX) 40 MG tablet Take 1 tablet (40 mg total) by mouth 3 (three) times a week. Takes 40 mg po Monday, Wednesday, Friday (Patient not taking: Reported on 08/25/2022)     potassium chloride SA (KLOR-CON M) 20 MEQ tablet Take 1 tablet (20 mEq total) by mouth 3 (three) times a week. (Patient taking differently: Take 20 mEq by mouth 3 (three) times a week. 8am, NOON, 8pm) 13 tablet 3   potassium chloride SA (KLOR-CON M) 20 MEQ tablet Take 1 tablet (20 mEq total) by mouth 2 (two) times daily. 60 tablet 0   traMADol (ULTRAM) 50 MG tablet Take 1 tablet (50 mg total) by mouth every 6 (six) hours as needed for moderate pain. 30 tablet 0   No current facility-administered medications for this visit.     PHYSICAL EXAMINATION:   Vitals:   09/03/22 1442  BP: 91/66  Pulse: 79  Resp: 18  Temp: 97.7 F (36.5 C)  SpO2: 100%    Filed Weights     Physical Exam Vitals and nursing note reviewed.  HENT:     Head: Normocephalic and atraumatic.     Mouth/Throat:     Pharynx: Oropharynx is clear.  Eyes:     Extraocular Movements: Extraocular movements intact.     Pupils: Pupils are equal, round, and reactive to light.  Cardiovascular:     Rate and Rhythm: Normal rate and regular rhythm.  Pulmonary:     Comments: Decreased breath sounds bilaterally.  Abdominal:     Palpations: Abdomen is soft.  Musculoskeletal:        General: Normal range of motion.      Cervical back: Normal range of motion.  Skin:    General: Skin is warm.  Neurological:     General: No focal deficit present.     Mental Status: He is alert and oriented to person, place, and time.  Psychiatric:        Behavior: Behavior normal.        Judgment: Judgment  normal.      LABORATORY DATA:  I have reviewed the data as listed Lab Results  Component Value Date   WBC 12.7 (H) 08/25/2022   HGB 11.5 (L) 08/25/2022   HCT 35.4 (L) 08/25/2022   MCV 84.3 08/25/2022   PLT 300 08/25/2022   Recent Labs    07/25/22 2101 07/26/22 1100 08/17/22 1208 08/18/22 0522 08/20/22 0425 08/21/22 0331 08/25/22 1237  NA 136   < > 137   < > 138 140 137  K 3.2*   < > 2.8*   < > 3.3* 3.5 2.7*  CL 93*   < > 101   < > 106 107 101  CO2 31   < > 23   < > '28 30 28  '$ GLUCOSE 104*   < > 93   < > 83 84 95  BUN 17   < > 15   < > '15 14 14  '$ CREATININE 1.08   < > 1.15   < > 0.82 0.85 1.12  CALCIUM 7.8*   < > 7.7*   < > 7.2* 7.3* 7.4*  GFRNONAA >60   < > >60   < > >60 >60 >60  PROT 5.8*  --  5.3*  --   --   --  5.3*  ALBUMIN 2.4*  --  2.0*  --   --   --  2.1*  AST 19  --  31  --   --   --  23  ALT 9  --  16  --   --   --  17  ALKPHOS 71  --  88  --   --   --  91  BILITOT 1.3*  --  0.8  --   --   --  0.8   < > = values in this interval not displayed.     CT HEAD WO CONTRAST (5MM)  Result Date: 08/17/2022 CLINICAL DATA:  Fall EXAM: CT HEAD WITHOUT CONTRAST TECHNIQUE: Contiguous axial images were obtained from the base of the skull through the vertex without intravenous contrast. RADIATION DOSE REDUCTION: This exam was performed according to the departmental dose-optimization program which includes automated exposure control, adjustment of the mA and/or kV according to patient size and/or use of iterative reconstruction technique. COMPARISON:  None Available. FINDINGS: Brain: There is no mass, hemorrhage or extra-axial collection. The size and configuration of the ventricles and extra-axial CSF  spaces are normal. The brain parenchyma is normal, without acute or chronic infarction. Vascular: No abnormal hyperdensity of the major intracranial arteries or dural venous sinuses. No intracranial atherosclerosis. Skull: The visualized skull base, calvarium and extracranial soft tissues are normal. Sinuses/Orbits: No fluid levels or advanced mucosal thickening of the visualized paranasal sinuses. No mastoid or middle ear effusion. The orbits are normal. IMPRESSION: No acute intracranial abnormality. Electronically Signed   By: Ulyses Jarred M.D.   On: 08/17/2022 19:05   DG Chest Portable 1 View  Result Date: 08/17/2022 CLINICAL DATA:  Weakness, orthostatic hypertension EXAM: PORTABLE CHEST 1 VIEW COMPARISON:  Chest x-rays dated 07/26/2022 and 10/05/2020. FINDINGS: Stable cardiomegaly. Lungs are clear. No pleural effusion or pneumothorax is seen. Osseous structures about the chest are unremarkable. IMPRESSION: No active disease. No evidence of pneumonia or pulmonary edema. Stable cardiomegaly. Electronically Signed   By: Franki Cabot M.D.   On: 08/17/2022 12:51    ASSESSMENT & PLAN:   Symptomatic anemia # FEB 2024- Acute anemia hemoglobin 8-9 [Jan 2024-normal]; normocytic  normal  white count platelets.  On admission noted to have low B12 [140].  Interestingly patient's-hemoglobin was normal earlier [facility] 2024.  FEB 2024-iron studies ferritin 207; iron saturation-44.  Notice of iron deficiency.  Monoclonal gammopathy-multiple myeloma panel/kappa lambda light chain ratio-negative.  No evidence of hemolysis.  # B12 deficiency -FEB 2024 [140]-continue oral supplementation.  Follow-up B12 injections.  # Etiology of anemia: ? Malabsorption secondary to- SIBO [Dr.vanga] status post GI evaluation-EGD colonoscopy.  Hold off IV iron infusion.  # Severe Hypokalemia: potassium 2.7-likely secondary Lasix/Florinef- continue K-Dur twice a day.  Again discussed importance of close follow-up at the nursing  home/home.  # SEP 2023-[Dilateed Small bowels] 2.8 cm low-density lesion in the uncinate process of the pancreas is not substantially changed in the interval and reportedly stable back to 2021. There is no associated dilatation of the main pancreatic duct.  Defer to PCP for annual surveillaince.   # Cardiology : Orthostasis midodrine/Florinef- [as per cardiology]/History of CHF history of A-fib on Eliquis [started jan 2024; [place]] amiodarone-history of hemorrhoidal bleeding.  Monitor closely on Eliquis/anemia.  # Chronic respiratory failure pulmonary[Dr.Gonzalez] on home O2 stable.  #  SIBO [hx of hernia operation; ? 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 NEGATIVE FOR HIGH-GRADE DYSPLASIA AND MALIGNANCY.  Defer to GI/PCP for further recommendations.  # Decreased mobility-secondary to orthostatic hypotension.  As requested would recommend vertical platform lift as per patient's request.  However, patient will need to reach his PCP/cardiology-if he needs any insurance pre-authorization tec.   # DISPOSITION: # HOLD venofer/B12 injections  # follow up in 6 months- MD; labs-CBC CMP iron studies ferritin B12; Vit D- 25 OH levels;  possible Venofer / 12 injection-Dr.B  All questions were answered. The patient knows to call the clinic with any problems, questions or concerns.  Cammie Sickle, MD 09/03/2022 4:23 PM

## 2022-09-03 NOTE — Progress Notes (Signed)
Arrived from [facility] via EMS. Was readmitted to facility post fall at home. Denies dizziness when sitting or lying down. Has immediate sensation of dizziness when he tries to stand or stands for 1 minute or less.. Has syncopal episodes coincidentally while on Bedside commode. Syncope x 1 month now. New Afib since January. Learning to use a bedpan for bowel movements.

## 2022-09-03 NOTE — Assessment & Plan Note (Addendum)
#   FEB 2024- Acute anemia hemoglobin 8-9 [Jan 2024-normal]; normocytic  normal white count platelets.  On admission noted to have low B12 [140].  Interestingly patient's-hemoglobin was normal earlier [facility] 2024.  FEB 2024-iron studies ferritin 207; iron saturation-44.  Notice of iron deficiency.  Monoclonal gammopathy-multiple myeloma panel/kappa lambda light chain ratio-negative.  No evidence of hemolysis.  # B12 deficiency -FEB 2024 [140]-continue oral supplementation.  Follow-up B12 injections.  # Etiology of anemia: ? Malabsorption secondary to- SIBO [Dr.vanga] status post GI evaluation-EGD colonoscopy.  Hold off IV iron infusion.  # Severe Hypokalemia: potassium 2.7-likely secondary Lasix/Florinef- continue K-Dur twice a day.  Again discussed importance of close follow-up at the nursing home/home.  # SEP 2023-[Dilateed Small bowels] 2.8 cm low-density lesion in the uncinate process of the pancreas is not substantially changed in the interval and reportedly stable back to 2021. There is no associated dilatation of the main pancreatic duct.  Defer to PCP for annual surveillaince.   # Cardiology : Orthostasis midodrine/Florinef- [as per cardiology]/History of CHF history of A-fib on Eliquis [started jan 2024; [place]] amiodarone-history of hemorrhoidal bleeding.  Monitor closely on Eliquis/anemia.  # Chronic respiratory failure pulmonary[Dr.Gonzalez] on home O2 stable.  #  SIBO [hx of hernia operation; ? 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 NEGATIVE FOR HIGH-GRADE DYSPLASIA AND MALIGNANCY.  Defer to GI/PCP for further recommendations.  # Decreased mobility-secondary to orthostatic hypotension.  As requested would recommend vertical platform lift as per patient's request.  However, patient will need to reach his PCP/cardiology-if he needs any insurance pre-authorization tec.   # DISPOSITION: # HOLD venofer/B12  injections  # follow up in 6 months- MD; labs-CBC CMP iron studies ferritin B12; Vit D- 25 OH levels;  possible Venofer / 12 injection-Dr.B

## 2022-09-07 DIAGNOSIS — J9601 Acute respiratory failure with hypoxia: Secondary | ICD-10-CM | POA: Diagnosis not present

## 2022-09-08 ENCOUNTER — Inpatient Hospital Stay
Admission: EM | Admit: 2022-09-08 | Discharge: 2022-10-22 | DRG: 286 | Disposition: E | Payer: Medicare Other | Source: Skilled Nursing Facility | Attending: Internal Medicine | Admitting: Internal Medicine

## 2022-09-08 ENCOUNTER — Emergency Department: Payer: Medicare Other

## 2022-09-08 ENCOUNTER — Other Ambulatory Visit: Payer: Self-pay

## 2022-09-08 ENCOUNTER — Encounter: Payer: Self-pay | Admitting: Cardiology

## 2022-09-08 ENCOUNTER — Ambulatory Visit: Payer: Medicare Other | Attending: Cardiology | Admitting: Cardiology

## 2022-09-08 VITALS — BP 92/64 | HR 118 | Ht 73.0 in | Wt 294.0 lb

## 2022-09-08 DIAGNOSIS — E538 Deficiency of other specified B group vitamins: Secondary | ICD-10-CM | POA: Diagnosis present

## 2022-09-08 DIAGNOSIS — Z66 Do not resuscitate: Secondary | ICD-10-CM | POA: Diagnosis present

## 2022-09-08 DIAGNOSIS — Z6837 Body mass index (BMI) 37.0-37.9, adult: Secondary | ICD-10-CM | POA: Diagnosis not present

## 2022-09-08 DIAGNOSIS — G909 Disorder of the autonomic nervous system, unspecified: Secondary | ICD-10-CM | POA: Diagnosis not present

## 2022-09-08 DIAGNOSIS — I739 Peripheral vascular disease, unspecified: Secondary | ICD-10-CM | POA: Diagnosis not present

## 2022-09-08 DIAGNOSIS — Z515 Encounter for palliative care: Secondary | ICD-10-CM

## 2022-09-08 DIAGNOSIS — D649 Anemia, unspecified: Secondary | ICD-10-CM | POA: Diagnosis not present

## 2022-09-08 DIAGNOSIS — Z1152 Encounter for screening for COVID-19: Secondary | ICD-10-CM

## 2022-09-08 DIAGNOSIS — J9621 Acute and chronic respiratory failure with hypoxia: Secondary | ICD-10-CM | POA: Diagnosis not present

## 2022-09-08 DIAGNOSIS — D72828 Other elevated white blood cell count: Secondary | ICD-10-CM | POA: Diagnosis present

## 2022-09-08 DIAGNOSIS — E662 Morbid (severe) obesity with alveolar hypoventilation: Secondary | ICD-10-CM | POA: Diagnosis present

## 2022-09-08 DIAGNOSIS — I5032 Chronic diastolic (congestive) heart failure: Secondary | ICD-10-CM | POA: Diagnosis present

## 2022-09-08 DIAGNOSIS — Z7189 Other specified counseling: Secondary | ICD-10-CM | POA: Diagnosis not present

## 2022-09-08 DIAGNOSIS — I4891 Unspecified atrial fibrillation: Secondary | ICD-10-CM | POA: Diagnosis not present

## 2022-09-08 DIAGNOSIS — I959 Hypotension, unspecified: Secondary | ICD-10-CM

## 2022-09-08 DIAGNOSIS — I110 Hypertensive heart disease with heart failure: Principal | ICD-10-CM | POA: Diagnosis present

## 2022-09-08 DIAGNOSIS — G903 Multi-system degeneration of the autonomic nervous system: Secondary | ICD-10-CM | POA: Diagnosis not present

## 2022-09-08 DIAGNOSIS — J9611 Chronic respiratory failure with hypoxia: Secondary | ICD-10-CM | POA: Diagnosis present

## 2022-09-08 DIAGNOSIS — E876 Hypokalemia: Secondary | ICD-10-CM | POA: Diagnosis present

## 2022-09-08 DIAGNOSIS — D72829 Elevated white blood cell count, unspecified: Secondary | ICD-10-CM | POA: Diagnosis present

## 2022-09-08 DIAGNOSIS — K638219 Small intestinal bacterial overgrowth, unspecified: Secondary | ICD-10-CM | POA: Diagnosis present

## 2022-09-08 DIAGNOSIS — I5043 Acute on chronic combined systolic (congestive) and diastolic (congestive) heart failure: Secondary | ICD-10-CM | POA: Diagnosis present

## 2022-09-08 DIAGNOSIS — Z9981 Dependence on supplemental oxygen: Secondary | ICD-10-CM | POA: Diagnosis not present

## 2022-09-08 DIAGNOSIS — N179 Acute kidney failure, unspecified: Secondary | ICD-10-CM

## 2022-09-08 DIAGNOSIS — Z751 Person awaiting admission to adequate facility elsewhere: Secondary | ICD-10-CM

## 2022-09-08 DIAGNOSIS — R0602 Shortness of breath: Secondary | ICD-10-CM | POA: Diagnosis not present

## 2022-09-08 DIAGNOSIS — Z79899 Other long term (current) drug therapy: Secondary | ICD-10-CM | POA: Diagnosis not present

## 2022-09-08 DIAGNOSIS — E8809 Other disorders of plasma-protein metabolism, not elsewhere classified: Secondary | ICD-10-CM | POA: Diagnosis present

## 2022-09-08 DIAGNOSIS — R0989 Other specified symptoms and signs involving the circulatory and respiratory systems: Secondary | ICD-10-CM | POA: Diagnosis present

## 2022-09-08 DIAGNOSIS — Z9889 Other specified postprocedural states: Secondary | ICD-10-CM

## 2022-09-08 DIAGNOSIS — E861 Hypovolemia: Secondary | ICD-10-CM | POA: Diagnosis not present

## 2022-09-08 DIAGNOSIS — Z9181 History of falling: Secondary | ICD-10-CM

## 2022-09-08 DIAGNOSIS — I4819 Other persistent atrial fibrillation: Secondary | ICD-10-CM

## 2022-09-08 DIAGNOSIS — I509 Heart failure, unspecified: Secondary | ICD-10-CM | POA: Diagnosis not present

## 2022-09-08 DIAGNOSIS — Z7901 Long term (current) use of anticoagulants: Secondary | ICD-10-CM | POA: Diagnosis not present

## 2022-09-08 DIAGNOSIS — I950 Idiopathic hypotension: Secondary | ICD-10-CM | POA: Diagnosis not present

## 2022-09-08 DIAGNOSIS — E66813 Obesity, class 3: Secondary | ICD-10-CM | POA: Diagnosis present

## 2022-09-08 DIAGNOSIS — R570 Cardiogenic shock: Secondary | ICD-10-CM | POA: Diagnosis not present

## 2022-09-08 DIAGNOSIS — Z7401 Bed confinement status: Secondary | ICD-10-CM

## 2022-09-08 DIAGNOSIS — K219 Gastro-esophageal reflux disease without esophagitis: Secondary | ICD-10-CM | POA: Diagnosis not present

## 2022-09-08 DIAGNOSIS — I5030 Unspecified diastolic (congestive) heart failure: Secondary | ICD-10-CM

## 2022-09-08 DIAGNOSIS — Z800 Family history of malignant neoplasm of digestive organs: Secondary | ICD-10-CM

## 2022-09-08 DIAGNOSIS — Z7952 Long term (current) use of systemic steroids: Secondary | ICD-10-CM

## 2022-09-08 DIAGNOSIS — I951 Orthostatic hypotension: Secondary | ICD-10-CM | POA: Diagnosis present

## 2022-09-08 DIAGNOSIS — M170 Bilateral primary osteoarthritis of knee: Secondary | ICD-10-CM | POA: Diagnosis present

## 2022-09-08 DIAGNOSIS — J90 Pleural effusion, not elsewhere classified: Secondary | ICD-10-CM | POA: Diagnosis not present

## 2022-09-08 LAB — URINALYSIS, COMPLETE (UACMP) WITH MICROSCOPIC
Bilirubin Urine: NEGATIVE
Glucose, UA: NEGATIVE mg/dL
Hgb urine dipstick: NEGATIVE
Ketones, ur: NEGATIVE mg/dL
Leukocytes,Ua: NEGATIVE
Nitrite: NEGATIVE
Protein, ur: NEGATIVE mg/dL
Specific Gravity, Urine: 1.005 (ref 1.005–1.030)
pH: 5 (ref 5.0–8.0)

## 2022-09-08 LAB — CBC
HCT: 38.4 % — ABNORMAL LOW (ref 39.0–52.0)
Hemoglobin: 12.5 g/dL — ABNORMAL LOW (ref 13.0–17.0)
MCH: 27.8 pg (ref 26.0–34.0)
MCHC: 32.6 g/dL (ref 30.0–36.0)
MCV: 85.3 fL (ref 80.0–100.0)
Platelets: 340 10*3/uL (ref 150–400)
RBC: 4.5 MIL/uL (ref 4.22–5.81)
RDW: 17 % — ABNORMAL HIGH (ref 11.5–15.5)
WBC: 15.2 10*3/uL — ABNORMAL HIGH (ref 4.0–10.5)
nRBC: 0 % (ref 0.0–0.2)

## 2022-09-08 LAB — TROPONIN I (HIGH SENSITIVITY)
Troponin I (High Sensitivity): 5 ng/L (ref <18)
Troponin I (High Sensitivity): 6 ng/L (ref <18)

## 2022-09-08 LAB — RESPIRATORY PANEL BY PCR

## 2022-09-08 LAB — RESP PANEL BY RT-PCR (RSV, FLU A&B, COVID)  RVPGX2
Influenza A by PCR: NEGATIVE
Influenza B by PCR: NEGATIVE
Resp Syncytial Virus by PCR: NEGATIVE
SARS Coronavirus 2 by RT PCR: NEGATIVE

## 2022-09-08 LAB — BASIC METABOLIC PANEL
Anion gap: 15 (ref 5–15)
BUN: 18 mg/dL (ref 8–23)
CO2: 26 mmol/L (ref 22–32)
Calcium: 7.5 mg/dL — ABNORMAL LOW (ref 8.9–10.3)
Chloride: 97 mmol/L — ABNORMAL LOW (ref 98–111)
Creatinine, Ser: 1.3 mg/dL — ABNORMAL HIGH (ref 0.61–1.24)
GFR, Estimated: 57 mL/min — ABNORMAL LOW (ref >60)
Glucose, Bld: 100 mg/dL — ABNORMAL HIGH (ref 70–99)
Potassium: 3.1 mmol/L — ABNORMAL LOW (ref 3.5–5.1)
Sodium: 138 mmol/L (ref 135–145)

## 2022-09-08 LAB — GLUCOSE, CAPILLARY: Glucose-Capillary: 93 mg/dL (ref 70–99)

## 2022-09-08 LAB — BRAIN NATRIURETIC PEPTIDE: B Natriuretic Peptide: 144.4 pg/mL — ABNORMAL HIGH (ref 0.0–100.0)

## 2022-09-08 LAB — PREALBUMIN: Prealbumin: 7 mg/dL — ABNORMAL LOW (ref 18–38)

## 2022-09-08 LAB — MRSA NEXT GEN BY PCR, NASAL: MRSA by PCR Next Gen: NOT DETECTED

## 2022-09-08 LAB — MAGNESIUM: Magnesium: 1.5 mg/dL — ABNORMAL LOW (ref 1.7–2.4)

## 2022-09-08 LAB — PROTIME-INR
INR: 2.1 — ABNORMAL HIGH (ref 0.8–1.2)
Prothrombin Time: 23.5 seconds — ABNORMAL HIGH (ref 11.4–15.2)

## 2022-09-08 MED ORDER — ORAL CARE MOUTH RINSE: 15.0000 mL | OROMUCOSAL | Status: DC | PRN

## 2022-09-08 MED ORDER — POTASSIUM CHLORIDE 10 MEQ/100ML IV SOLN
10.0000 meq | Freq: Once | INTRAVENOUS | Status: AC
  Administered 2022-09-08: 10 meq via INTRAVENOUS
  Filled 2022-09-08: qty 100

## 2022-09-08 MED ORDER — VITAMIN B-12 1000 MCG PO TABS
1000.0000 ug | ORAL_TABLET | Freq: Every day | ORAL | Status: DC
  Administered 2022-09-10 – 2022-09-23 (×14): 1000 ug via ORAL
  Filled 2022-09-08 (×15): qty 1

## 2022-09-08 MED ORDER — MIDODRINE HCL 5 MG PO TABS: 15.0000 mg | ORAL_TABLET | Freq: Three times a day (TID) | ORAL | Status: DC

## 2022-09-08 MED ORDER — SODIUM CHLORIDE 0.9 % IV SOLN: 250.0000 mL | INTRAVENOUS | Status: DC | PRN

## 2022-09-08 MED ORDER — SODIUM CHLORIDE 0.9 % IV SOLN: INTRAVENOUS | Status: DC

## 2022-09-08 MED ORDER — CHLORHEXIDINE GLUCONATE CLOTH 2 % EX PADS
6.0000 | MEDICATED_PAD | Freq: Every day | CUTANEOUS | Status: DC
  Administered 2022-09-08 – 2022-09-17 (×6): 6 via TOPICAL
  Filled 2022-09-08: qty 6

## 2022-09-08 MED ORDER — SODIUM CHLORIDE 0.9% FLUSH: 3.0000 mL | INTRAVENOUS | Status: DC | PRN

## 2022-09-08 MED ORDER — APIXABAN 5 MG PO TABS
5.0000 mg | ORAL_TABLET | Freq: Two times a day (BID) | ORAL | Status: DC
  Administered 2022-09-08: 5 mg via ORAL
  Filled 2022-09-08: qty 1

## 2022-09-08 MED ORDER — AMIODARONE HCL 200 MG PO TABS
400.0000 mg | ORAL_TABLET | Freq: Two times a day (BID) | ORAL | Status: DC
  Administered 2022-09-08 – 2022-09-23 (×29): 400 mg via ORAL
  Filled 2022-09-08 (×31): qty 2

## 2022-09-08 MED ORDER — DICYCLOMINE HCL 10 MG PO CAPS
10.0000 mg | ORAL_CAPSULE | Freq: Three times a day (TID) | ORAL | Status: DC | PRN
  Administered 2022-09-08 – 2022-09-24 (×14): 10 mg via ORAL
  Filled 2022-09-08 (×18): qty 1

## 2022-09-08 MED ORDER — MIDODRINE HCL 5 MG PO TABS
15.0000 mg | ORAL_TABLET | Freq: Once | ORAL | Status: AC
  Administered 2022-09-08: 15 mg via ORAL
  Filled 2022-09-08: qty 3

## 2022-09-08 MED ORDER — MAGNESIUM SULFATE 2 GM/50ML IV SOLN
2.0000 g | Freq: Once | INTRAVENOUS | Status: AC
  Administered 2022-09-08: 2 g via INTRAVENOUS
  Filled 2022-09-08: qty 50

## 2022-09-08 MED ORDER — FUROSEMIDE 10 MG/ML IJ SOLN
40.0000 mg | Freq: Once | INTRAMUSCULAR | Status: AC
  Administered 2022-09-08: 40 mg via INTRAVENOUS
  Filled 2022-09-08: qty 4

## 2022-09-08 MED ORDER — MIDODRINE HCL 5 MG PO TABS
15.0000 mg | ORAL_TABLET | Freq: Three times a day (TID) | ORAL | Status: DC
  Administered 2022-09-08 – 2022-09-24 (×43): 15 mg via ORAL
  Filled 2022-09-08 (×43): qty 3

## 2022-09-08 MED ORDER — POTASSIUM CHLORIDE CRYS ER 20 MEQ PO TBCR
20.0000 meq | EXTENDED_RELEASE_TABLET | Freq: Once | ORAL | Status: AC
  Administered 2022-09-08: 20 meq via ORAL
  Filled 2022-09-08: qty 1

## 2022-09-08 MED ORDER — PANTOPRAZOLE SODIUM 40 MG PO TBEC
40.0000 mg | DELAYED_RELEASE_TABLET | ORAL | Status: DC
  Administered 2022-09-10 – 2022-09-22 (×6): 40 mg via ORAL
  Filled 2022-09-08 (×8): qty 1

## 2022-09-08 MED ORDER — NOREPINEPHRINE 4 MG/250ML-% IV SOLN: 2.0000 ug/min | INTRAVENOUS | Status: DC

## 2022-09-08 MED ORDER — POTASSIUM CHLORIDE CRYS ER 20 MEQ PO TBCR
20.0000 meq | EXTENDED_RELEASE_TABLET | ORAL | Status: DC
  Administered 2022-09-08: 20 meq via ORAL
  Filled 2022-09-08: qty 1

## 2022-09-08 MED ORDER — SODIUM CHLORIDE 0.9 % IV SOLN: 250.0000 mL | INTRAVENOUS | Status: DC

## 2022-09-08 MED ORDER — DIPHENHYDRAMINE HCL 25 MG PO CAPS
25.0000 mg | ORAL_CAPSULE | Freq: Once | ORAL | Status: DC
  Filled 2022-09-08: qty 1

## 2022-09-08 MED ORDER — FUROSEMIDE 10 MG/ML IJ SOLN
60.0000 mg | Freq: Two times a day (BID) | INTRAMUSCULAR | Status: DC
  Administered 2022-09-08 – 2022-09-09 (×2): 60 mg via INTRAVENOUS
  Filled 2022-09-08: qty 8
  Filled 2022-09-08: qty 6

## 2022-09-08 MED ORDER — SODIUM CHLORIDE 0.9% FLUSH
3.0000 mL | Freq: Two times a day (BID) | INTRAVENOUS | Status: DC
  Administered 2022-09-08 – 2022-09-10 (×4): 3 mL via INTRAVENOUS

## 2022-09-08 MED ORDER — POTASSIUM CHLORIDE CRYS ER 20 MEQ PO TBCR
40.0000 meq | EXTENDED_RELEASE_TABLET | Freq: Once | ORAL | Status: AC
  Administered 2022-09-08: 40 meq via ORAL
  Filled 2022-09-08: qty 2

## 2022-09-08 NOTE — H&P (Signed)
History and Physical    Patient: Ernest [name] [id number] DOB: Nov 12, 1945 DOA: 08/31/2022 DOS: the patient was seen and examined on 09/05/2022 PCP: Pcp, No  Patient coming from: SNF  Chief Complaint: Shortness of breath and dizziness  Chief Complaint  Patient presents with   Shortness of Breath   HPI: Ernest [name] is a 77 y.o. male with medical history significant of class III obesity, chronic venous insufficiency of lower extremities on Lasix, GERD, obesity hypoventilation syndrome and chronic respiratory failure on home O2 at 4L, HTN, recent diagnosis of A-fib, on amiodarone and Eliquis who underwent cardioversion in January 2024, chronic hypotension on midodrine, who presents to the ED after he went for a follow-up visit with his cardiologist today and was directed to come to the emergency room for further management. While at his cardiologist office patient was found to have dizziness after he had sat down for about 3 and half hours with associated shortness of breath. His cardiologist found him to have acutely change in his condition and therefore told to come to the emergency room.  History was obtained from patient as well as wife present at bedside.  He denied chest pain, nausea vomiting, abdominal pain, chest pain, headache or urinary complaints.   ED course and data reviewed: Blood pressure 86/56, temperature 97.6, pulse 80-84 ,respiratory rate 14-22, saturating 90 mid to high 90s on 4 L intranasal oxygen. Potassium 3.1, sodium 138, chloride 97, creatinine 1.3, magnesium 1.5, BNP 144. Patient's was found to have blood pressure in the 0000000 systolic however when cuff was adjusted blood pressure systolic SBP [id number].  ED physician spoke with cardiologist on-call who is aware of patient and following closely along with recommendation to start vasopressin if needed.  Intensivist have also been consulted and case discussed with me.  Patient was deemed to be acceptable for stepdown by  cardiologist as well as intensivist at this time and so hospitalist was informed to admit patient with above consultants on board.    Assessment and Plan:  Acute on chronic respiratory failure secondary to fluid overload in the setting of possible diastolic dysfunction Echo obtained January 2024 showed ejection fraction of 60 to [id number] however diastolic function was indeterminate. Cardiologist recommend continuation of IV Lasix 60 mg twice daily Planning on right heart catheterization tomorrow Neurology is consulted we appreciate input Monitor input and output Daily weight  Fluid restriction to 1.5 L/day   Acute on chronic persistent atrial fibrillation Currently rate controlled We will continue home dose of apixaban We will continue amiodarone as recommended by cardiologist   Hyp0tension likely orthostatic hypotension versus cardiogenic shock Orthostatic hypotension Continue midodrine According to cardiologist vasopressin can also be used if blood pressure becomes low Cardiologist suspects BP cuff dysfunction Will continue to monitor blood pressure closely while in stepdown Intensivist have been informed in case patient needs more pressors intensivist on board  Vitamin B12 deficiency Continue vitamin B12 supplementation  Hypokalemia-continue repletion of potassium and monitoring  Morbid obesity-we will counsel on weight loss when medically stable  Obesity hypoventilation syndrome-continue current oxygen supplementation To treat obesity when medically stable  GERD Continue PPI therapy  Normocytic anemia Currently hemoglobin 12.5  no indication for acute transfusion at this time  Leukocytosis likely reactive Presented with WBC 15.2 No evidence of infection at this time although patient met SIRS criteria We will continue to monitor closely Chest x-ray reviewed by me showed pulmonary vascular congestion with no infiltrate detected I will obtain urinalysis  DVT  prophylaxis-continue Eliquis   Review of Systems: As mentioned in the history of present illness. All other systems reviewed and are negative.    Past Medical History:  Diagnosis Date   Anemia    Arthritis    osteoarthritis - bilateral knees   Chronic venous insufficiency of lower extremity    Diastolic dysfunction    a. 12/2020 Echo: EF 50-55%, GrI DD; b. 06/2022 Echo: EF 60-65%, no rwma, nl RV fxn, nl LA size, mildly dil RA.   Dyspnea    Essential hypertension    GERD (gastroesophageal reflux disease)    Hypotension    a. 06/2022 midodrine added.   Obesity hypoventilation syndrome (HCC)    Persistent atrial fibrillation ([place])    a. Dx 06/2022-->amio/eliquis. CHA2DS2VASc = 3.   Sleep apnea    Small bowel obstruction ([place]) 2022   Uses roller walker    Past Surgical History:  Procedure Laterality Date   CARDIOVERSION N/A 07/28/2022   Procedure: CARDIOVERSION;  Surgeon: Wellington Hampshire, MD;  Location: [facility];  Service: Cardiovascular;  Laterality: N/A;   CATARACT EXTRACTION W/PHACO Left 08/06/2020   Procedure: CATARACT EXTRACTION PHACO AND INTRAOCULAR LENS PLACEMENT ([place]) LEFT;  Surgeon: Eulogio Bear, MD;  Location: [place];  Service: Ophthalmology;  Laterality: Left;  3.41 0:31.5   CATARACT EXTRACTION W/PHACO Right 08/27/2020   Procedure: CATARACT EXTRACTION PHACO AND INTRAOCULAR LENS PLACEMENT ([facility]) RIGHT 4.10 00:41.6;  Surgeon: Eulogio Bear, MD;  Location: [place];  Service: Ophthalmology;  Laterality: Right;   COLONOSCOPY WITH ESOPHAGOGASTRODUODENOSCOPY (EGD)     COLONOSCOPY WITH PROPOFOL N/A 11/01/2020   Procedure: COLONOSCOPY WITH PROPOFOL;  Surgeon: Lin Landsman, MD;  Location: [facility] ENDOSCOPY;  Service: Gastroenterology;  Laterality: N/A;   COLONOSCOPY WITH PROPOFOL N/A 03/18/2022   Procedure: COLONOSCOPY WITH BIOPSIES;  Surgeon: Lin Landsman, MD;  Location: [place];  Service: Endoscopy;  Laterality: N/A;    ESOPHAGOGASTRODUODENOSCOPY (EGD) WITH PROPOFOL N/A 11/01/2020   Procedure: ESOPHAGOGASTRODUODENOSCOPY (EGD) WITH PROPOFOL;  Surgeon: Lin Landsman, MD;  Location: [facility] ENDOSCOPY;  Service: Gastroenterology;  Laterality: N/A;   HERNIA REPAIR     inguinal and umbilical   POLYPECTOMY  03/18/2022   Procedure: POLYPECTOMY;  Surgeon: Lin Landsman, MD;  Location: [place];  Service: Endoscopy;;   Social History:  reports that he has never smoked. He has never used smokeless tobacco. He reports current alcohol use. He reports that he does not currently use drugs.  No Known Allergies  Family History  Problem Relation Age of Onset   Esophageal cancer Father    Fibromyalgia Sister     Prior to Admission medications   Medication Sig Start Date End Date Taking? Authorizing Provider  acetaminophen (TYLENOL) 325 MG tablet Take 325 mg by mouth every 8 (eight) hours as needed for moderate pain. 09/01/19  Yes [provider]  amiodarone (PACERONE) 200 MG tablet TAKE 1 TABLET BY MOUTH TWICE A DAY FOR 30 DAYS. THEN TAKE 1 TABLET EVERY DAY Patient taking differently: Take 200 mg by mouth daily. 07/18/22  Yes Fenton, Clint R, PA  apixaban (ELIQUIS) 5 MG TABS tablet Take 1 tablet (5 mg total) by mouth 2 (two) times daily. 06/25/22  Yes Croitoru, Mihai, MD  calcium-vitamin D (OSCAL WITH D) 500-5 MG-MCG tablet Take 2 tablets by mouth 2 (two) times daily. 08/25/22  Yes Cammie Sickle, MD  cyanocobalamin (VITAMIN B12) 1000 MCG tablet Take 1 tablet (1,000 mcg total) by mouth daily. 08/21/22  Yes Wouk, Ailene Rud, MD  dicyclomine (BENTYL) 10 MG capsule Take 1 capsule (10 mg total) by mouth 3 (three) times daily as needed for spasms. 08/01/22  Yes Sreenath, Sudheer B, MD  fludrocortisone (FLORINEF) 0.1 MG tablet Take 2 tablets (0.2 mg total) by mouth daily. 08/02/22  Yes Sreenath, Sudheer B, MD  furosemide (LASIX) 40 MG tablet Take 1 tablet (40 mg total) by mouth 3 (three) times a week.  Take one tablet (40mg ) three times a week. Mon/Wed/Fri 08/22/22  Yes Wouk, Ailene Rud, MD  loperamide (IMODIUM) 2 MG capsule Take 1 capsule (2 mg total) by mouth as needed for diarrhea or loose stools. 08/01/22  Yes Sreenath, Sudheer B, MD  midodrine (PROAMATINE) 10 MG tablet Take 1.5 tablets (15 mg total) by mouth 3 (three) times daily. 08/01/22  Yes Sreenath, Sudheer B, MD  Multiple Vitamins-Minerals (CENTRUM SILVER 50+MEN PO) Take 1 tablet by mouth in the morning.   Yes [provider]  nystatin (MYCOSTATIN/NYSTOP) powder Apply 1 Application topically 3 (three) times daily. 02/25/22  Yes Masoud, Viann Shove, MD  pantoprazole (PROTONIX) 40 MG tablet Take 1 tablet (40 mg total) by mouth 3 (three) times a week. Takes 40 mg po Monday, Wednesday, Friday 08/22/22  Yes Wouk, Ailene Rud, MD  potassium chloride SA (KLOR-CON M) 20 MEQ tablet Take 1 tablet (20 mEq total) by mouth 2 (two) times daily. Patient taking differently: Take 20 mEq by mouth daily. 08/25/22  Yes Cammie Sickle, MD  traMADol (ULTRAM) 50 MG tablet Take 1 tablet (50 mg total) by mouth every 6 (six) hours as needed for moderate pain. 08/21/22  Yes Wouk, Ailene Rud, MD  potassium chloride SA (KLOR-CON M) 20 MEQ tablet Take 1 tablet (20 mEq total) by mouth 3 (three) times a week. Patient not taking: Reported on 09/13/2022 07/18/22   Kate Sable, MD       Physical Exam: Vitals:   08/24/2022 1315 09/21/2022 1330 09/15/2022 1400 09/16/2022 1415  BP: 105/64 99/66 95/65  98/73  Pulse: 86 95 84 84  Resp: 14 (!) 27 13 14   Temp:      TempSrc:      SpO2: 100% 100% 100% 100%  Weight:      Height:       GENERAL: Patient in mild respiratory distress requiring 4 L of intranasal oxygen NECK: Supple, No masses.  CHEST: Decreased breath sounds especially at the bases CARDIAC: Heart sounds 1 and 2 heard rate irregularly irregular ABDOMEN: Soft, obese but nondistended EXTREMITIES: Bilateral lower extremity pitting edema 3+ LYMPHATIC: No  axillary or supraclavicular lymphadenopathy.  NEUROLOGIC: Patient is oriented x3 with no focal or lateralizing neurologic deficits.    Data Reviewed: Laboratory results reviewed as documented above in the HPI    Advance Care Planning:   Code Status: DNR goals of care were discussed with patient as well as wife present at bedside and they agreed to DNR patient has a DNR batch on the wrist as well.  Consults: Intensivist, cardiologist  Family Communication: Patient's wife present at bedside  Severity of Illness: The appropriate patient status for this patient is INPATIENT. Inpatient status is judged to be reasonable and necessary in order to provide the required intensity of service to ensure the patient's safety. The patient's presenting symptoms, physical exam findings, and initial radiographic and laboratory data in the context of their chronic comorbidities is felt to place them at high risk for further clinical deterioration. Furthermore, it is not anticipated that the patient will be medically  stable for discharge from the hospital within 2 midnights of admission.   * I certify that at the point of admission it is my clinical judgment that the patient will require inpatient hospital care spanning beyond 2 midnights from the point of admission due to high intensity of service, high risk for further deterioration and high frequency of surveillance required.*   I spent a total of 1 hour admitting this patient reviewing previous chart discussing the case with the intensivist, patient's wife as well as cardiologist and ED physician including patient's nurse.  Author: Verline Lema, MD 09/19/2022 3:23 PM  For on call review [email or web address].

## 2022-09-08 NOTE — Consult Note (Signed)
PHARMACY CONSULT NOTE - FOLLOW UP  Pharmacy Consult for Electrolyte Monitoring and Replacement   Recent Labs: Potassium (mmol/L)  Date Value  09/08/2022 3.1 (L)   Magnesium (mg/dL)  Date Value  09/08/2022 1.5 (L)   Calcium (mg/dL)  Date Value  09/08/2022 7.5 (L)   Albumin (g/dL)  Date Value  08/25/2022 2.1 (L)   Phosphorus (mg/dL)  Date Value  08/19/2022 3.6   Sodium (mmol/L)  Date Value  09/08/2022 138  07/04/2022 141     Assessment: 77 year old male with a PMH of PVD  with chronic 3+ BLLE,chronic hypoxemic respiratory failure on 4 L oxygen at home,  and recent diagnosis of atrial fibrillation started on apixaban and amiodarone outpatient, presents with SOB, hypotension, and vascular congestion. Pharmacy consulted to monitor and replace electrolytes while in ICU.  Meds:  + Lasix 60 mg IV BID started 3/18  Goal of Therapy:  K+ 4.0-5.1; Mg 2-2.4 ISO afib All other electrolytes WNL  Plan:  K 3.1: patient received KCL PO 40 mEq x 1 + KCL 10 mEq IV  x 1 as ordered by MD Will order additional PO 20 mEq x 1 KCL given started on IV lasix D/c PTA: PO KCL 20 mEq 3/weekly while replenishing acutely - patient received 1 dose  Mag 1.5: 2 gram IV x 1 ordered by MD Recheck all electrolytes with AM labs   Dorothe Pea, PharmD, BCPS Clinical Pharmacist   09/08/2022 5:29 PM

## 2022-09-08 NOTE — ED Provider Notes (Signed)
[name]    Event Date/Time   First MD Initiated Contact with Patient 09/08/22 1148     (approximate)   History   Shortness of Breath   HPI  Ernest [name] is a 77 y.o. male with history of atrial fibrillation on Eliquis, orthostatic hypotension, syncope, on 4 L at baseline who comes in with concerns for shortness of breath.  Patient seen at cardiology clinic noted to have increased swelling, compliant with his Lasix noted to be in A-fib with RVR concern for low blood pressure sent here for further evaluation.  He is supposed to be getting discharged from this facility shortly.  On review of hospital admission patient is supposed to be on midodrine and fluid cortisone for his low blood pressures.  He reports been compliant with his Lasix 40 mg he takes Monday Wednesday Friday.   Physical Exam   Triage Vital Signs: ED Triage Vitals [09/08/22 1143]  Enc Vitals Group     BP (!) 67/57     Pulse Rate (!) 129     Resp 20     Temp 97.6 F (36.4 C)     Temp Source Oral     SpO2 93 %     Weight      Height      Head Circumference      Peak Flow      Pain Score 5     Pain Loc      Pain Edu?      Excl. in [place]?     Most recent vital signs: Vitals:   09/08/22 1143  BP: (!) 67/57  Pulse: (!) 129  Resp: 20  Temp: 97.6 F (36.4 C)  SpO2: 93%     General: Awake, no distress.  CV:  Good peripheral perfusion.  Irregular, tachycardic Resp:  Normal effort.  Abd:  No distention.  Other:  Edema noted in bilateral legs   ED Results / Procedures / Treatments   Labs (all labs ordered are listed, but only abnormal results are displayed) Labs Reviewed  BASIC METABOLIC PANEL  CBC  TROPONIN I (HIGH SENSITIVITY)     EKG  My interpretation of EKG:  Atrial fibrillation rate of 128 without any ST elevation or T wave versions, QTc prolonged at 554  RADIOLOGY I have reviewed the xray personally and interpreted and patient has  some vascular congestion with enlarged heart  PROCEDURES:  Critical Care performed: Yes, see critical care procedure note(s)  .1-3 Lead EKG Interpretation  Performed by: Vanessa Shawnee, MD Authorized by: Vanessa Knights[place], MD     Interpretation: abnormal     ECG rate:  80   ECG rate assessment: normal     Rhythm: atrial fibrillation     Ectopy: none     Conduction: normal   .Critical Care  Performed by: Vanessa [place], MD Authorized by: Vanessa [place], MD   Critical care provider statement:    Critical care time (minutes):  30   Critical care was necessary to treat or prevent imminent or life-threatening deterioration of the following conditions:  Cardiac failure   Critical care was time spent personally by me on the following activities:  Development of treatment plan with patient or surrogate, discussions with consultants, evaluation of patient's response to treatment, examination of patient, ordering and review of laboratory studies, ordering and review of radiographic studies, ordering and performing treatments and interventions, pulse oximetry, re-evaluation of patient's condition and  review of old charts    MEDICATIONS ORDERED IN ED: Medications  magnesium sulfate IVPB 2 g 50 mL (2 g Intravenous New Bag/Given 09/08/22 1357)  potassium chloride 10 mEq in 100 mL IVPB (10 mEq Intravenous New Bag/Given 09/08/22 1358)  0.9 %  sodium chloride infusion (has no administration in time range)  midodrine (PROAMATINE) tablet 15 mg (15 mg Oral Given 09/08/22 1211)  potassium chloride SA (KLOR-CON M) CR tablet 40 mEq (40 mEq Oral Given 09/08/22 1431)  furosemide (LASIX) injection 40 mg (40 mg Intravenous Given 09/08/22 1431)     IMPRESSION / MDM / ASSESSMENT AND PLAN / ED COURSE  I reviewed the triage vital signs and the nursing notes.   Patient's presentation is most consistent with acute presentation with potential threat to life or bodily function.   Patient comes in with A-fib with RVR  but upon patient is resting in the bed his heart rates actually come down to the 80s to 90s indication for cardioversion.  His blood pressures have been maps of over 65 he chronically runs low I did give him a dose of his home midodrine.  Labs ordered evaluate for Electra abnormalities, AKI.  Doubt PE given on blood thinner.  Seems most likely related to CHF.  Mag is low.  Potassium low.  CBC shows stable hemoglobin  1:30 PM  Discussed with Dr Leland Her give a dose of IV Lasix will hold off on vasopressors at this time given his blood pressures are stable.  Patient is blood pressures started to go low therefore the heart failure team was consulted patient was evaluated.  He went to start patient on vasopressin admit to stepdown.  ICU was also been evaluating the patient but this admission will be admitted to hospitalist and if ICU is take over due to continuing lowering blood pressure that can.  The patient is on the cardiac monitor to evaluate for evidence of arrhythmia and/or significant heart rate changes.      FINAL CLINICAL IMPRESSION(S) / ED DIAGNOSES   Final diagnoses:  Acute on chronic congestive heart failure, unspecified heart failure type (HCC)  Atrial fibrillation, unspecified type ([place])  Hypomagnesemia  Hypokalemia     Rx / DC Orders   ED Discharge Orders     None        Note:  This document was prepared using Dragon voice recognition software and may include unintentional dictation errors.   Vanessa [place], MD 09/08/22 [phone]

## 2022-09-08 NOTE — Progress Notes (Signed)
       CROSS COVER NOTE  NAME: Ernest [name] MRN: [id number] DOB : 01/02/1946 ATTENDING PHYSICIAN: Verline Lema, MD    Date of Service   09/08/2022   HPI/Events of Note   Report ***Pt request for benadryl as sleep aid and SCDs for BLE compression. He would like to wear SCDs and take a break from compression stockings On Review of chart *** Bedside eval*** HPI***  Interventions   Assessment/Plan: Benadryl 25mg  PO x1 X X    *** professional thanks      To reach the provider On-Call:   7AM- 7PM see care teams to locate the attending and reach out to them via [email or web address] Password: [email or web address] 7PM-7AM contact night-coverage If you still have difficulty reaching the appropriate provider, please page the [facility] (Director on Call) for [facility] on amion for assistance  This document was prepared using Systems analyst and may include unintentional dictation errors.  Neomia Glass DNP, MBA, FNP-BC, PMHNP-BC Nurse Practitioner [facility] Pager [phone]

## 2022-09-08 NOTE — ED Triage Notes (Addendum)
Pt to ED via POV from cardiologist office. Pt sent by Dr. Gery Pray for afib RVR, SOB and LE edema.   Pt BP in triage 67/57 and taken to room 13

## 2022-09-08 NOTE — Consult Note (Signed)
CRITICAL CARE    Name: Ernest [name] MRN: [id number] DOB: January 25, 1946     LOS: 0   SUBJECTIVE FINDINGS & SIGNIFICANT EVENTS    History of presenting illness 77 year old male with a PMH of morbid obesity with a BMI of over 39, peripheral vascular disease with chronic 3+ lower extremity edema on Lasix at home, GERD, OSA with obesity hypoventilation, does have chronic hypoxemic respiratory failure and utilizes 4 L of supplemental oxygen at home, essential hypertension, recent diagnosis of atrial fibrillation has been seen by cardiology placed on antiarrhythmic agents with amiodarone and anticoagulation with Eliquis.  Also does have a background history of orthostatic hypotension uses midodrine at home.  He was seen in cardiology clinic with lightheadedness presyncope and disequilibrium asked by staff to be seen in emergency department for hospitalization and additional evaluation.  Wife is at bedside with report of progressively worsening dyspnea, in the ED he was found to be hypotensive despite taking his midodrine, was on home 4 L of oxygen with saturation over 90% does have mild CKD without AKI BNP was ordered which was mildly elevated in the context morbid obesity.  He does have compression stockings on with 3+ lower extremity pitting edema.  He was evaluated by cardiology and was placed on vasopressor support with pulmonary critical care consultation placed for additional evaluation management while in stepdown.  Lines/tubes :   Microbiology/Sepsis markers: Results for orders placed or performed during the hospital encounter of 07/25/22  Gastrointestinal Panel by PCR , Stool     Status: None   Collection Time: 07/26/22  1:39 AM   Specimen: STOOL  Result Value Ref Range Status   Campylobacter species NOT DETECTED  NOT DETECTED Final   Plesimonas shigelloides NOT DETECTED NOT DETECTED Final   Salmonella species NOT DETECTED NOT DETECTED Final   Yersinia enterocolitica NOT DETECTED NOT DETECTED Final   Vibrio species NOT DETECTED NOT DETECTED Final   Vibrio cholerae NOT DETECTED NOT DETECTED Final   Enteroaggregative E coli (EAEC) NOT DETECTED NOT DETECTED Final   Enteropathogenic E coli (EPEC) NOT DETECTED NOT DETECTED Final   Enterotoxigenic E coli (ETEC) NOT DETECTED NOT DETECTED Final   Shiga like toxin producing E coli (STEC) NOT DETECTED NOT DETECTED Final   Shigella/Enteroinvasive E coli (EIEC) NOT DETECTED NOT DETECTED Final   Cryptosporidium NOT DETECTED NOT DETECTED Final   Cyclospora cayetanensis NOT DETECTED NOT DETECTED Final   Entamoeba histolytica NOT DETECTED NOT DETECTED Final   Giardia lamblia NOT DETECTED NOT DETECTED Final   Adenovirus F40/41 NOT DETECTED NOT DETECTED Final   Astrovirus NOT DETECTED NOT DETECTED Final   Norovirus GI/GII NOT DETECTED NOT DETECTED Final   Rotavirus A NOT DETECTED NOT DETECTED Final   Sapovirus (I, [name], IV, and V) NOT DETECTED NOT DETECTED Final    Comment: Performed at [facility], [address] [place], [place] [address]  C Difficile Quick Screen w PCR reflex     Status: None   Collection Time: 07/26/22  7:13 AM   Specimen: STOOL  Result Value Ref Range Status   C Diff antigen NEGATIVE NEGATIVE Final   C Diff toxin NEGATIVE NEGATIVE Final   C Diff interpretation No C. difficile detected.  Final    Comment: Performed at [facility], [address] [place], [place] [address]    Anti-infectives:  Anti-infectives (From admission, onward)    None        Consults: Treatment Team:  Wellington Hampshire, MD  PAST MEDICAL HISTORY   Past Medical History:  Diagnosis Date   Anemia    Arthritis    osteoarthritis - bilateral knees   Chronic venous insufficiency of lower extremity    Diastolic dysfunction    a.  12/2020 Echo: EF 50-55%, GrI DD; b. 06/2022 Echo: EF 60-65%, no rwma, nl RV fxn, nl LA size, mildly dil RA.   Dyspnea    Essential hypertension    GERD (gastroesophageal reflux disease)    Hypotension    a. 06/2022 midodrine added.   Obesity hypoventilation syndrome (HCC)    Persistent atrial fibrillation ([place])    a. Dx 06/2022-->amio/eliquis. CHA2DS2VASc = 3.   Sleep apnea    Small bowel obstruction ([place]) 2022   Uses roller walker      SURGICAL HISTORY   Past Surgical History:  Procedure Laterality Date   CARDIOVERSION N/A 07/28/2022   Procedure: CARDIOVERSION;  Surgeon: Wellington Hampshire, MD;  Location: [facility];  Service: Cardiovascular;  Laterality: N/A;   CATARACT EXTRACTION W/PHACO Left 08/06/2020   Procedure: CATARACT EXTRACTION PHACO AND INTRAOCULAR LENS PLACEMENT ([place]) LEFT;  Surgeon: Eulogio Bear, MD;  Location: [place];  Service: Ophthalmology;  Laterality: Left;  3.41 0:31.5   CATARACT EXTRACTION W/PHACO Right 08/27/2020   Procedure: CATARACT EXTRACTION PHACO AND INTRAOCULAR LENS PLACEMENT ([facility]) RIGHT 4.10 00:41.6;  Surgeon: Eulogio Bear, MD;  Location: [place];  Service: Ophthalmology;  Laterality: Right;   COLONOSCOPY WITH ESOPHAGOGASTRODUODENOSCOPY (EGD)     COLONOSCOPY WITH PROPOFOL N/A 11/01/2020   Procedure: COLONOSCOPY WITH PROPOFOL;  Surgeon: Lin Landsman, MD;  Location: [facility] ENDOSCOPY;  Service: Gastroenterology;  Laterality: N/A;   COLONOSCOPY WITH PROPOFOL N/A 03/18/2022   Procedure: COLONOSCOPY WITH BIOPSIES;  Surgeon: Lin Landsman, MD;  Location: [place];  Service: Endoscopy;  Laterality: N/A;   ESOPHAGOGASTRODUODENOSCOPY (EGD) WITH PROPOFOL N/A 11/01/2020   Procedure: ESOPHAGOGASTRODUODENOSCOPY (EGD) WITH PROPOFOL;  Surgeon: Lin Landsman, MD;  Location: [facility] ENDOSCOPY;  Service: Gastroenterology;  Laterality: N/A;   HERNIA REPAIR     inguinal and umbilical   POLYPECTOMY  03/18/2022   Procedure:  POLYPECTOMY;  Surgeon: Lin Landsman, MD;  Location: [place];  Service: Endoscopy;;     FAMILY HISTORY   Family History  Problem Relation Age of Onset   Esophageal cancer Father    Fibromyalgia Sister      SOCIAL HISTORY   Social History   Tobacco Use   Smoking status: Never   Smokeless tobacco: Never   Tobacco comments:    Never smoke 07/14/22  Substance Use Topics   Alcohol use: Yes    Alcohol/week: 0.0 standard drinks of alcohol    Comment: rarely   Drug use: Not Currently     MEDICATIONS   Current Medication:  Current Facility-Administered Medications:    0.9 %  sodium chloride infusion, 250 mL, Intravenous, PRN, Idolina Primer, Ryan M, PA-C   [START ON 09/09/2022] 0.9 %  sodium chloride infusion, , Intravenous, Continuous, Dunn, Ryan M, PA-C   amiodarone (PACERONE) tablet 400 mg, 400 mg, Oral, BID, Djan, Gordy Councilman, MD   apixaban (ELIQUIS) tablet 5 mg, 5 mg, Oral, BID, Djan, Gordy Councilman, MD   Chlorhexidine Gluconate Cloth 2 % PADS 6 each, 6 each, Topical, Daily, Djan, Gordy Councilman, MD   [START ON 09/09/2022] cyanocobalamin (VITAMIN B12) tablet 1,000 mcg, 1,000 mcg, Oral, Daily, Djan, Prince T, MD   furosemide (LASIX) injection 60 mg, 60 mg, Intravenous, BID, Sabharwal, Aditya, DO  midodrine (PROAMATINE) tablet 15 mg, 15 mg, Oral, TID WC, Sabharwal, Aditya, DO   [START ON 09/05/2022] pantoprazole (PROTONIX) EC tablet 40 mg, 40 mg, Oral, Q M,W,F, Djan, Prince T, MD   potassium chloride SA (KLOR-CON M) CR tablet 20 mEq, 20 mEq, Oral, Once per day on Mon Wed Fri, Djan, Prince T, MD   sodium chloride flush (NS) 0.9 % injection 3 mL, 3 mL, Intravenous, Q12H, Dunn, Ryan M, PA-C   sodium chloride flush (NS) 0.9 % injection 3 mL, 3 mL, Intravenous, PRN, Idolina Primer, Ryan M, PA-C  Current Outpatient Medications:    acetaminophen (TYLENOL) 325 MG tablet, Take 325 mg by mouth every 8 (eight) hours as needed for moderate pain., Disp: , Rfl:    amiodarone (PACERONE) 200 MG tablet, TAKE  1 TABLET BY MOUTH TWICE A DAY FOR 30 DAYS. THEN TAKE 1 TABLET EVERY DAY (Patient taking differently: Take 200 mg by mouth daily.), Disp: 90 tablet, Rfl: 0   apixaban (ELIQUIS) 5 MG TABS tablet, Take 1 tablet (5 mg total) by mouth 2 (two) times daily., Disp: 180 tablet, Rfl: 3   calcium-vitamin D (OSCAL WITH D) 500-5 MG-MCG tablet, Take 2 tablets by mouth 2 (two) times daily., Disp: 60 tablet, Rfl: 0   cyanocobalamin (VITAMIN B12) 1000 MCG tablet, Take 1 tablet (1,000 mcg total) by mouth daily., Disp: 30 tablet, Rfl: 1   dicyclomine (BENTYL) 10 MG capsule, Take 1 capsule (10 mg total) by mouth 3 (three) times daily as needed for spasms., Disp: , Rfl:    fludrocortisone (FLORINEF) 0.1 MG tablet, Take 2 tablets (0.2 mg total) by mouth daily., Disp: , Rfl:    furosemide (LASIX) 40 MG tablet, Take 1 tablet (40 mg total) by mouth 3 (three) times a week. Take one tablet (40mg ) three times a week. Mon/Wed/Fri, Disp: , Rfl:    loperamide (IMODIUM) 2 MG capsule, Take 1 capsule (2 mg total) by mouth as needed for diarrhea or loose stools., Disp: 30 capsule, Rfl: 0   midodrine (PROAMATINE) 10 MG tablet, Take 1.5 tablets (15 mg total) by mouth 3 (three) times daily., Disp: 90 tablet, Rfl: 3   Multiple Vitamins-Minerals (CENTRUM SILVER 50+MEN PO), Take 1 tablet by mouth in the morning., Disp: , Rfl:    nystatin (MYCOSTATIN/NYSTOP) powder, Apply 1 Application topically 3 (three) times daily., Disp: 60 g, Rfl: 6   pantoprazole (PROTONIX) 40 MG tablet, Take 1 tablet (40 mg total) by mouth 3 (three) times a week. Takes 40 mg po Monday, Wednesday, Friday, Disp: , Rfl:    potassium chloride SA (KLOR-CON M) 20 MEQ tablet, Take 1 tablet (20 mEq total) by mouth 2 (two) times daily. (Patient taking differently: Take 20 mEq by mouth daily.), Disp: 60 tablet, Rfl: 0   traMADol (ULTRAM) 50 MG tablet, Take 1 tablet (50 mg total) by mouth every 6 (six) hours as needed for moderate pain., Disp: 30 tablet, Rfl: 0   potassium  chloride SA (KLOR-CON M) 20 MEQ tablet, Take 1 tablet (20 mEq total) by mouth 3 (three) times a week. (Patient not taking: Reported on 08/29/2022), Disp: 13 tablet, Rfl: 3    ALLERGIES   Patient has no known allergies.    REVIEW OF SYSTEMS    10 point review of systems is done and is negative except as per subjective findings in HPI  PHYSICAL EXAMINATION   Vital Signs: Temp:  [97.6 F (36.4 C)] 97.6 F (36.4 C) (03/18 1544) Pulse Rate:  [78-129] 84 (03/18 1415) Resp:  [  13-27] 14 (03/18 1415) BP: (67-105)/(57-78) 98/73 (03/18 1415) SpO2:  [93 %-100 %] 100 % (03/18 1415) Weight:  [133.4 kg-134.5 kg] 134.5 kg (03/18 1153)  GENERAL: Morbidly obese age-appropriate HEAD: Normocephalic, atraumatic.  EYES: Pupils equal, round, reactive to light.  No scleral icterus.  MOUTH: Moist mucosal membrane.  Poor dentition NECK: Supple. No thyromegaly. No nodules. No JVD.  PULMONARY: Patent expiratory crackles worse posteriorly CARDIOVASCULAR: S1 and S2. Regular rate and rhythm. No murmurs, rubs, or gallops.  GASTROINTESTINAL: Soft, nontender, non-distended. No masses. Positive bowel sounds. No hepatosplenomegaly.  MUSCULOSKELETAL: No swelling, clubbing, or edema.  NEUROLOGIC: Mild distress due to acute illness SKIN:intact,warm,dry   PERTINENT DATA     Infusions:  sodium chloride     [START ON 09/09/2022] sodium chloride     Scheduled Medications:  amiodarone  400 mg Oral BID   apixaban  5 mg Oral BID   Chlorhexidine Gluconate Cloth  6 each Topical Daily   [START ON 09/09/2022] cyanocobalamin  1,000 mcg Oral Daily   furosemide  60 mg Intravenous BID   midodrine  15 mg Oral TID WC   [START ON 09/03/2022] pantoprazole  40 mg Oral Q M,W,F   potassium chloride SA  20 mEq Oral Once per day on Mon Wed Fri   sodium chloride flush  3 mL Intravenous Q12H   PRN Medications: sodium chloride, sodium chloride flush Hemodynamic parameters:   Intake/Output: No intake/output data  recorded.  Ventilator  Settings:    LAB RESULTS:  Basic Metabolic Panel: Recent Labs  Lab 09/07/2022 1205  NA 138  K 3.1*  CL 97*  CO2 26  GLUCOSE 100*  BUN 18  CREATININE 1.30*  CALCIUM 7.5*  MG 1.5*   Liver Function Tests: No results for input(s): "AST", "ALT", "ALKPHOS", "BILITOT", "PROT", "ALBUMIN" in the last 168 hours. No results for input(s): "LIPASE", "AMYLASE" in the last 168 hours. No results for input(s): "AMMONIA" in the last 168 hours. CBC: Recent Labs  Lab 09/09/2022 1205  WBC 15.2*  HGB 12.5*  HCT 38.4*  MCV 85.3  PLT 340   Cardiac Enzymes: No results for input(s): "CKTOTAL", "CKMB", "CKMBINDEX", "TROPONINI" in the last 168 hours. BNP: Invalid input(s): "POCBNP" CBG: No results for input(s): "GLUCAP" in the last 168 hours.     IMAGING RESULTS:  Imaging: DG Chest Port 1 View  Result Date: 08/30/2022 CLINICAL DATA:  Shortness of breath EXAM: PORTABLE CHEST 1 VIEW COMPARISON:  X-ray 08/17/2022 FINDINGS: Enlarged cardiopericardial silhouette with calcified and tortuous aorta. Central vascular congestion. Tiny pleural effusions. No pneumothorax or edema. Overlapping cardiac leads. Curvature and degenerative changes along the spine. Underinflation. Film is under penetrated IMPRESSION: Enlarged cardiopericardial silhouette with vascular congestion and tiny effusions. The inferior right costophrenic angle is clipped off the edge of the film Electronically Signed   By: Jill Side M.D.   On: 09/04/2022 12:30   @PROBHOSP @ DG Chest Port 1 View  Result Date: 09/20/2022 CLINICAL DATA:  Shortness of breath EXAM: PORTABLE CHEST 1 VIEW COMPARISON:  X-ray 08/17/2022 FINDINGS: Enlarged cardiopericardial silhouette with calcified and tortuous aorta. Central vascular congestion. Tiny pleural effusions. No pneumothorax or edema. Overlapping cardiac leads. Curvature and degenerative changes along the spine. Underinflation. Film is under penetrated IMPRESSION: Enlarged  cardiopericardial silhouette with vascular congestion and tiny effusions. The inferior right costophrenic angle is clipped off the edge of the film Electronically Signed   By: Jill Side M.D.   On: 09/13/2022 12:30        ASSESSMENT AND  PLAN    -Multidisciplinary rounds held today  Acute on chronic hypoxic Respiratory Failure -Possibly due to atelectasis with interstitial edema secondary to cardiac dysfunction versus lower respiratory tract infection.  Patient does have leukocytosis which may be indicative of active infection.  He is currently not on systemic steroids nor takes steroids at home. -Chest x-ray with blunted cardiophrenic and bilateral costophrenic angles with cephalization suggestive of pleural effusions with pulmonary interstitial edema, no grossly apparent consolidated infiltrate.  Notable cardiomegaly -Diuresis as able -Viral testing including COVID and non-COVID panel Most recent transthoracic echo performed July 08, 2022 with RV and RA enlargement but normal LVEF.  Acute on chronic decompensated diastolic heart failure with rapid atrial fibrillation -Status post cardiology evaluation-appreciate input decompensated heart failure with atrial fibrillation with RVR -lasix 60 bid ordered  -vasopressor support as per cardiology    Renal Failure-KDIGO stage 2 - acute on chronic -follow chem 7 -follow UO -continue Foley Catheter-assess need daily -dc non essential nephrotoxins  Obstructive sleep apnea with pickwickian and OHS overlap syndrome  - May benefit from CPAP overnight with respiratory therapy for settings  GI/Nutrition GI PROPHYLAXIS as indicated DIET-->TF's as tolerated Constipation protocol as indicated  ENDO - ICU hypoglycemic\Hyperglycemia protocol -check FSBS per protocol   ELECTROLYTES -follow labs as needed -replace as needed -pharmacy consultation   DVT/GI PRX ordered -SCDs  TRANSFUSIONS AS NEEDED MONITOR FSBS ASSESS the need for  LABS as needed  Critical care provider statement:   Total critical care time: 33 minutes   Performed by: Lanney Gins MD   Critical care time was exclusive of separately billable procedures and treating other patients.   Critical care was necessary to treat or prevent imminent or life-threatening deterioration.   Critical care was time spent personally by me on the following activities: development of treatment plan with patient and/or surrogate as well as nursing, discussions with consultants, evaluation of patient's response to treatment, examination of patient, obtaining history from patient or surrogate, ordering and performing treatments and interventions, ordering and review of laboratory studies, ordering and review of radiographic studies, pulse oximetry and re-evaluation of patient's condition.    Ottie Glazier, M.D.  Pulmonary & Critical Care Medicine     This document was prepared using Dragon voice recognition software and may include unintentional dictation errors.    Ottie Glazier, M.D.  Division of [place]

## 2022-09-08 NOTE — Patient Instructions (Addendum)
SENT PATIENT TO ED   Medication Instructions:   Your physician recommends that you continue on your current medications as directed. Please refer to the Current Medication list given to you today.  *If you need a refill on your cardiac medications before your next appointment, please call your pharmacy*   Lab Work:  None Ordered  If you have labs (blood work) drawn today and your tests are completely normal, you will receive your results only by: [place] (if you have MyChart) OR A paper copy in the mail If you have any lab test that is abnormal or we need to change your treatment, we will call you to review the results.   Testing/Procedures:  None Ordered   Follow-Up: At [facility], you and your health needs are our priority.  As part of our continuing mission to provide you with exceptional heart care, we have created designated Provider Care Teams.  These Care Teams include your primary Cardiologist (physician) and Advanced Practice Providers (APPs -  Physician Assistants and Nurse Practitioners) who all work together to provide you with the care you need, when you need it.  We recommend signing up for the patient portal called "MyChart".  Sign up information is provided on this After Visit Summary.  MyChart is used to connect with patients for Virtual Visits (Telemedicine).  Patients are able to view lab/test results, encounter notes, upcoming appointments, etc.  Non-urgent messages can be sent to your provider as well.   To learn more about what you can do with MyChart, go to [email or web address].    Your next appointment:   3 month(s)  Provider:   You may see Kate Sable, MD or one of the following Advanced Practice Providers on your designated Care Team:   Murray Hodgkins, NP Christell Faith, PA-C Cadence Kathlen Mody, PA-C Gerrie Nordmann, NP  Other Instructions  SENT PATIENT TO ED  Referred to EP - Dr. Quentin Ore - ASAP Referred to HF clinic

## 2022-09-08 NOTE — Progress Notes (Signed)
Cardiology Office Note:    Date:  09/08/2022   ID:  Ernest [name], DOB February 12, 1946, MRN [id number]  PCP:  Kathyrn Lass   [place] Prov[facility] Cardiologist:  Kate Sable, MD     Referring MD: [name], MD   Chief Complaint  Patient presents with   Follow-up    6 week f/u, SOB, Dizziness, requesting documents signed    History of Present Illness:    Ernest [name] is a 77 y.o. male with a hx of persistent atrial fibrillation s/p DC cardioversion 2/24, hypertension, morbid obesity, sleep apnea, chronic respiratory failure on home oxygen presenting for follow-up.  Last seen for A-fib and hypotension.  Midodrine increased to 10 mg 3 times daily.  Admitted to the hospital with symptoms of diarrhea, persistent atrial fibrillation noted, underwent DC cardioversion successfully.  Currently in a skilled nursing facility, has not been doing much of physical therapy due to being very weak.  Takes Lasix 3 times weekly, endorses leg edema, shortness of breath.  Did not tolerate CPAP machine in the past.  Blood pressures have been running low despite midodrine 15 mg 3 times daily.  His PCP recently stopped working, looking for a new provider.  Prior notes/studies Did not tolerate CPAP, not candidate for inspire device due to weight. Echocardiogram 06/2022 showed normal EF 60 to 65%,  Past Medical History:  Diagnosis Date   Anemia    Arthritis    osteoarthritis - bilateral knees   Chronic venous insufficiency of lower extremity    Diastolic dysfunction    a. 12/2020 Echo: EF 50-55%, GrI DD; b. 06/2022 Echo: EF 60-65%, no rwma, nl RV fxn, nl LA size, mildly dil RA.   Dyspnea    Essential hypertension    GERD (gastroesophageal reflux disease)    Hypotension    a. 06/2022 midodrine added.   Obesity hypoventilation syndrome (HCC)    Persistent atrial fibrillation ([place])    a. Dx 06/2022-->amio/eliquis. CHA2DS2VASc = 3.   Sleep apnea    Small bowel obstruction ([place]) 2022    Uses roller walker     Past Surgical History:  Procedure Laterality Date   CARDIOVERSION N/A 07/28/2022   Procedure: CARDIOVERSION;  Surgeon: Wellington Hampshire, MD;  Location: [facility]S;  Service: Cardiovascular;  Laterality: N/A;   CATARACT EXTRACTION W/PHACO Left 08/06/2020   Procedure: CATARACT EXTRACTION PHACO AND INTRAOCULAR LENS PLACEMENT ([place]) LEFT;  Surgeon: Eulogio Bear, MD;  Location: [place];  Service: Ophthalmology;  Laterality: Left;  3.41 0:31.5   CATARACT EXTRACTION W/PHACO Right 08/27/2020   Procedure: CATARACT EXTRACTION PHACO AND INTRAOCULAR LENS PLACEMENT (IOC) RIGHT 4.10 00:41.6;  Surgeon: Eulogio Bear, MD;  Location: [place];  Service: Ophthalmology;  Laterality: Right;   COLONOSCOPY WITH ESOPHAGOGASTRODUODENOSCOPY (EGD)     COLONOSCOPY WITH PROPOFOL N/A 11/01/2020   Procedure: COLONOSCOPY WITH PROPOFOL;  Surgeon: Lin Landsman, MD;  Location: [facility] ENDOSCOPY;  Service: Gastroenterology;  Laterality: N/A;   COLONOSCOPY WITH PROPOFOL N/A 03/18/2022   Procedure: COLONOSCOPY WITH BIOPSIES;  Surgeon: Lin Landsman, MD;  Location: [place];  Service: Endoscopy;  Laterality: N/A;   ESOPHAGOGASTRODUODENOSCOPY (EGD) WITH PROPOFOL N/A 11/01/2020   Procedure: ESOPHAGOGASTRODUODENOSCOPY (EGD) WITH PROPOFOL;  Surgeon: Lin Landsman, MD;  Location: [facility] ENDOSCOPY;  Service: Gastroenterology;  Laterality: N/A;   HERNIA REPAIR     inguinal and umbilical   POLYPECTOMY  03/18/2022   Procedure: POLYPECTOMY;  Surgeon: Lin Landsman, MD;  Location: [facility];  Service: Endoscopy;;    Current Medications: Current Meds  Medication Sig   acetaminophen (TYLENOL) 325 MG tablet Take 325 mg by mouth every 8 (eight) hours as needed for moderate pain.   amiodarone (PACERONE) 200 MG tablet TAKE 1 TABLET BY MOUTH TWICE A DAY FOR 30 DAYS. THEN TAKE 1 TABLET EVERY DAY (Patient taking differently: Take 200 mg by mouth daily.)    apixaban (ELIQUIS) 5 MG TABS tablet Take 1 tablet (5 mg total) by mouth 2 (two) times daily.   calcium-vitamin D (OSCAL WITH D) 500-5 MG-MCG tablet Take 2 tablets by mouth 2 (two) times daily.   cyanocobalamin (VITAMIN B12) 1000 MCG tablet Take 1 tablet (1,000 mcg total) by mouth daily.   dicyclomine (BENTYL) 10 MG capsule Take 1 capsule (10 mg total) by mouth 3 (three) times daily as needed for spasms.   fludrocortisone (FLORINEF) 0.1 MG tablet Take 2 tablets (0.2 mg total) by mouth daily.   furosemide (LASIX) 40 MG tablet Take 1 tablet (40 mg total) by mouth 3 (three) times a week. Take one tablet (40mg ) three times a week. Mon/Wed/Fri   loperamide (IMODIUM) 2 MG capsule Take 1 capsule (2 mg total) by mouth as needed for diarrhea or loose stools.   midodrine (PROAMATINE) 10 MG tablet Take 1.5 tablets (15 mg total) by mouth 3 (three) times daily.   Multiple Vitamins-Minerals (CENTRUM SILVER 50+MEN PO) Take 1 tablet by mouth in the morning.   nystatin (MYCOSTATIN/NYSTOP) powder Apply 1 Application topically 3 (three) times daily. (Patient taking differently: Apply 1 Application topically 3 (three) times daily. After shower)   pantoprazole (PROTONIX) 40 MG tablet Take 1 tablet (40 mg total) by mouth 3 (three) times a week. Takes 40 mg po Monday, Wednesday, Friday   potassium chloride SA (KLOR-CON M) 20 MEQ tablet Take 1 tablet (20 mEq total) by mouth 2 (two) times daily.   traMADol (ULTRAM) 50 MG tablet Take 1 tablet (50 mg total) by mouth every 6 (six) hours as needed for moderate pain.     Allergies:   Patient has no known allergies.   Social History   Socioeconomic History   Marital status: Married    Spouse name: Not on file   Number of children: Not on file   Years of education: Not on file   Highest education level: Not on file  Occupational History   Not on file  Tobacco Use   Smoking status: Never   Smokeless tobacco: Never   Tobacco comments:    Never smoke 07/14/22  Substance  and Sexual Activity   Alcohol use: Yes    Alcohol/week: 0.0 standard drinks of alcohol    Comment: rarely   Drug use: Not Currently   Sexual activity: Not on file  Other Topics Concern   Not on file  Social History Narrative   Lives locally with wife.  Does not routinely exercise.   Social Determinants of Health   Financial Resource Strain: Low Risk  (06/21/2021)   Overall Financial Resource Strain (CARDIA)    Difficulty of Paying Living Expenses: Not hard at all  Food Insecurity: No Food Insecurity (08/25/2022)   Hunger Vital Sign    Worried About Running Out of Food in the Last Year: Never true    Ran Out of Food in the Last Year: Never true  Transportation Needs: No Transportation Needs (08/25/2022)   PRAPARE - Hydrologist (Medical): No    Lack of Transportation (  Non-Medical): No  Physical Activity: Inactive (06/21/2021)   Exercise Vital Sign    Days of Exercise per Week: 0 days    Minutes of Exercise per Session: 0 min  Stress: No Stress Concern Present (06/21/2021)   [place]    Feeling of Stress : Not at all  Social Connections: Unknown (06/21/2021)   Social Connection and Isolation Panel [NHANES]    Frequency of Communication with Friends and Family: Twice a week    Frequency of Social Gatherings with Friends and Family: Never    Attends Religious Services: Patient declined    Marine scientist or Organizations: Patient declined    Attends Archivist Meetings: Patient declined    Marital Status: Married     Family History: The patient's family history includes Esophageal cancer in his father; Fibromyalgia in his sister.  ROS:   Please see the history of present illness.     All other systems reviewed and are negative.  EKGs/Labs/Other Studies Reviewed:    The following studies were reviewed today:   EKG:  EKG is  ordered today.  The ekg ordered today  demonstrates atrial fibrillation with rapid ventricular response.  Recent Labs: 07/14/2022: TSH 1.791 08/17/2022: B Natriuretic Peptide 138.3 08/20/2022: Magnesium 2.0 08/25/2022: ALT 17; BUN 14; Creatinine, Ser 1.12; Hemoglobin 11.5; Platelets 300; Potassium 2.7; Sodium 137  Recent Lipid Panel No results found for: "CHOL", "TRIG", "HDL", "CHOLHDL", "VLDL", "LDLCALC", "LDLDIRECT"   Risk Assessment/Calculations:             Physical Exam:    VS:  BP 92/64 (BP Location: Right Arm, Patient Position: Sitting, Cuff Size: Large)   Pulse (!) 118   Ht 6\' 1"  (1.854 m)   Wt 294 lb (133.4 kg)   SpO2 99% Comment: 4 liter  BMI 38.79 kg/m     Wt Readings from Last 3 Encounters:  09/08/22 296 lb 8.3 oz (134.5 kg)  09/08/22 294 lb (133.4 kg)  08/25/22 (!) 308 lb (139.7 kg)     GEN: Moderate respiratory distress at rest HEENT: Normal NECK: No JVD; No carotid bruits CARDIAC: Irregular irregular RESPIRATORY: Diminished breath sounds, no wheezing, no rhonchi ABDOMEN: Soft, non-tender, distended MUSCULOSKELETAL:  2+ edema; No deformity  SKIN: Warm and dry NEUROLOGIC:  Alert and oriented x 3 PSYCHIATRIC:  Normal affect   ASSESSMENT:    1. Persistent atrial fibrillation ([place])   2. Idiopathic hypotension   3. Morbid obesity ([place])   4. Heart failure with preserved ejection fraction, unspecified HF chronicity ([place])    PLAN:    In order of problems listed above:  Persistent A-fib, s/p DC cardioversion 2/24.  EKG showing A-fib heart rate 118.  Continue amiodarone, Eliquis.  Patient appears dyspneic, very uncomfortable.  Recommend he goes to the emergency room, previously declined, now agreeable.  Consider cardioversion, will need EP input ?consider Tikosyn. Hypotension, systolic 0000000 today.  Continue midodrine, Florinef. Morbid obesity, chronic respiratory failure, untreated sleep apnea.  HFpEF, patient very dyspneic on minimal exertion, 2+ leg edema.  Lasix 3 times weekly, low blood pressures  preventing up titration of diuretics.  Consider pressors with diuretics if patient gets admitted.  Refer to advanced heart failure clinic.     Follow-up in 1 month.  Medication Adjustments/Labs and Tests Ordered: Current medicines are reviewed at length with the patient today.  Concerns regarding medicines are outlined above.  Orders Placed This Encounter  Procedures   Ambulatory referral to Cardiac  Electrophysiology   AMB referral to CHF clinic   EKG 12-Lead   No orders of the defined types were placed in this encounter.   Patient Instructions  SENT PATIENT TO ED   Medication Instructions:   Your physician recommends that you continue on your current medications as directed. Please refer to the Current Medication list given to you today.  *If you need a refill on your cardiac medications before your next appointment, please call your pharmacy*   Lab Work:  None Ordered  If you have labs (blood work) drawn today and your tests are completely normal, you will receive your results only by: [place] (if you have MyChart) OR A paper copy in the mail If you have any lab test that is abnormal or we need to change your treatment, we will call you to review the results.   Testing/Procedures:  None Ordered   Follow-Up: At [facility], you and your health needs are our priority.  As part of our continuing mission to provide you with exceptional heart care, we have created designated Provider Care Teams.  These Care Teams include your primary Cardiologist (physician) and Advanced Practice Providers (APPs -  Physician Assistants and Nurse Practitioners) who all work together to provide you with the care you need, when you need it.  We recommend signing up for the patient portal called "MyChart".  Sign up information is provided on this After Visit Summary.  MyChart is used to connect with patients for Virtual Visits (Telemedicine).  Patients are able to view lab/test  results, encounter notes, upcoming appointments, etc.  Non-urgent messages can be sent to your provider as well.   To learn more about what you can do with MyChart, go to [email or web address].    Your next appointment:   3 month(s)  Provider:   You may see Kate Sable, MD or one of the following Advanced Practice Providers on your designated Care Team:   Murray Hodgkins, NP Christell Faith, PA-C Cadence Kathlen Mody, PA-C Gerrie Nordmann, NP  Other Instructions  SENT PATIENT TO ED  Referred to EP - Dr. Quentin Ore - ASAP Referred to HF clinic    Signed, Kate Sable, MD  09/08/2022 12:06 PM    [place]

## 2022-09-08 NOTE — Consult Note (Signed)
ADVANCED HEART FAILURE CONSULT NOTE  Referring Physician: No ref. provider found  Primary Care: Pcp, No  HPI: Ernest [name] is a 77 y.o. male with persistent atrial fibrillation status post cardioversion in February 2024, hypertension, morbid obesity, chronic respiratory failure on home O2 that currently lives in a skilled nursing facility presenting as a direct admission from cardiology clinic due to hypotension, decompensated heart failure and atrial fibrillation with RVR. Over the past 2 months Ernest [name] has had 2 prolonged admissions at [facility].  He was admitted from February 2 to February 9 for orthostatic hypotension and syncope.  During that admission he was started on midodrine 15 mg 3 times daily, Florinef 20mg  and had an abdominal binder placed.  He was sent to rehab but unfortunately had another fall there.  He was once again admitted to [facility] where he had an extensive evaluation for hypotension including workup for VIPoma.  Workup was essentially unremarkable.  Today on my evaluation patient sitting in bed comfortably but with conversational dyspnea.  3-4+ pitting edema to his thighs.  After moving his blood pressure cuff to his right upper extremity, blood pressure of 88/58.  Past Medical History:  Diagnosis Date   Anemia    Arthritis    osteoarthritis - bilateral knees   Chronic venous insufficiency of lower extremity    Diastolic dysfunction    a. 12/2020 Echo: EF 50-55%, GrI DD; b. 06/2022 Echo: EF 60-65%, no rwma, nl RV fxn, nl LA size, mildly dil RA.   Dyspnea    Essential hypertension    GERD (gastroesophageal reflux disease)    Hypotension    a. 06/2022 midodrine added.   Obesity hypoventilation syndrome (HCC)    Persistent atrial fibrillation ([place])    a. Dx 06/2022-->amio/eliquis. CHA2DS2VASc = 3.   Sleep apnea    Small bowel obstruction (HCC) 2022   Uses roller walker     Current Facility-Administered Medications  Medication Dose Route Frequency Provider Last  Rate Last Admin   magnesium sulfate IVPB 2 g 50 mL  2 g Intravenous Once Vanessa [place], MD 50 mL/hr at 09/08/22 1357 2 g at 09/08/22 1357   potassium chloride 10 mEq in 100 mL IVPB  10 mEq Intravenous Once Vanessa [place], MD 50 mL/hr at 09/08/22 1358 10 mEq at 09/08/22 1358   Current Outpatient Medications  Medication Sig Dispense Refill   acetaminophen (TYLENOL) 325 MG tablet Take 325 mg by mouth every 8 (eight) hours as needed for moderate pain.     amiodarone (PACERONE) 200 MG tablet TAKE 1 TABLET BY MOUTH TWICE A DAY FOR 30 DAYS. THEN TAKE 1 TABLET EVERY DAY (Patient taking differently: Take 200 mg by mouth daily.) 90 tablet 0   apixaban (ELIQUIS) 5 MG TABS tablet Take 1 tablet (5 mg total) by mouth 2 (two) times daily. 180 tablet 3   calcium-vitamin D (OSCAL WITH D) 500-5 MG-MCG tablet Take 2 tablets by mouth 2 (two) times daily. 60 tablet 0   cyanocobalamin (VITAMIN B12) 1000 MCG tablet Take 1 tablet (1,000 mcg total) by mouth daily. 30 tablet 1   dicyclomine (BENTYL) 10 MG capsule Take 1 capsule (10 mg total) by mouth 3 (three) times daily as needed for spasms.     fludrocortisone (FLORINEF) 0.1 MG tablet Take 2 tablets (0.2 mg total) by mouth daily.     furosemide (LASIX) 40 MG tablet Take 1 tablet (40 mg total) by mouth 3 (three) times a week. Take one tablet (  40mg ) three times a week. Mon/Wed/Fri     loperamide (IMODIUM) 2 MG capsule Take 1 capsule (2 mg total) by mouth as needed for diarrhea or loose stools. 30 capsule 0   midodrine (PROAMATINE) 10 MG tablet Take 1.5 tablets (15 mg total) by mouth 3 (three) times daily. 90 tablet 3   Multiple Vitamins-Minerals (CENTRUM SILVER 50+MEN PO) Take 1 tablet by mouth in the morning.     nystatin (MYCOSTATIN/NYSTOP) powder Apply 1 Application topically 3 (three) times daily. 60 g 6   pantoprazole (PROTONIX) 40 MG tablet Take 1 tablet (40 mg total) by mouth 3 (three) times a week. Takes 40 mg po Monday, Wednesday, Friday     potassium chloride SA  (KLOR-CON M) 20 MEQ tablet Take 1 tablet (20 mEq total) by mouth 2 (two) times daily. (Patient taking differently: Take 20 mEq by mouth daily.) 60 tablet 0   traMADol (ULTRAM) 50 MG tablet Take 1 tablet (50 mg total) by mouth every 6 (six) hours as needed for moderate pain. 30 tablet 0   potassium chloride SA (KLOR-CON M) 20 MEQ tablet Take 1 tablet (20 mEq total) by mouth 3 (three) times a week. (Patient not taking: Reported on 09/08/2022) 13 tablet 3    No Known Allergies    Social History   Socioeconomic History   Marital status: Married    Spouse name: Not on file   Number of children: Not on file   Years of education: Not on file   Highest education level: Not on file  Occupational History   Not on file  Tobacco Use   Smoking status: Never   Smokeless tobacco: Never   Tobacco comments:    Never smoke 07/14/22  Substance and Sexual Activity   Alcohol use: Yes    Alcohol/week: 0.0 standard drinks of alcohol    Comment: rarely   Drug use: Not Currently   Sexual activity: Not on file  Other Topics Concern   Not on file  Social History Narrative   Lives locally with wife.  Does not routinely exercise.   Social Determinants of Health   Financial Resource Strain: Low Risk  (06/21/2021)   Overall Financial Resource Strain (CARDIA)    Difficulty of Paying Living Expenses: Not hard at all  Food Insecurity: No Food Insecurity (08/25/2022)   Hunger Vital Sign    Worried About Running Out of Food in the Last Year: Never true    Ran Out of Food in the Last Year: Never true  Transportation Needs: No Transportation Needs (08/25/2022)   PRAPARE - Hydrologist (Medical): No    Lack of Transportation (Non-Medical): No  Physical Activity: Inactive (06/21/2021)   Exercise Vital Sign    Days of Exercise per Week: 0 days    Minutes of Exercise per Session: 0 min  Stress: No Stress Concern Present (06/21/2021)   [place]    Feeling of Stress : Not at all  Social Connections: Unknown (06/21/2021)   Social Connection and Isolation Panel [NHANES]    Frequency of Communication with Friends and Family: Twice a week    Frequency of Social Gatherings with Friends and Family: Never    Attends Religious Services: Patient declined    Marine scientist or Organizations: Patient declined    Attends Archivist Meetings: Patient declined    Marital Status: Married  Human resources officer Violence: Not At Risk (08/25/2022)  Humiliation, Afraid, Rape, and Kick questionnaire    Fear of Current or Ex-Partner: No    Emotionally Abused: No    Physically Abused: No    Sexually Abused: No      Family History  Problem Relation Age of Onset   Esophageal cancer Father    Fibromyalgia Sister     PHYSICAL EXAM: Vitals:   09/08/22 1400 09/08/22 1415  BP: 95/65 98/73  Pulse: 84 84  Resp: 13 14  Temp:    SpO2: 100% 100%   GENERAL: Well nourished, well developed, and in no apparent distress at rest.  HEENT: Negative for arcus senilis or xanthelasma. There is no scleral icterus.  The mucous membranes are pink and moist.   NECK: Supple, No masses. Normal carotid upstrokes without bruits. No masses or thyromegaly.    CHEST: There are no chest wall deformities. There is no chest wall tenderness. Respirations are unlabored.  Lungs-decreased lung sounds bilaterally CARDIAC:  JVP: Difficult to visualize         Normal S1, S2  Normal rate with irregular rhythm. No murmurs, rubs or gallops.  Pulses are 2+ and symmetrical in upper and lower extremities.  3+ edema to the thighs.  ABDOMEN: Soft, non-tender, non-distended. There are no masses or hepatomegaly. There are normal bowel sounds.  EXTREMITIES: Warm and well perfused with no cyanosis, clubbing.  LYMPHATIC: No axillary or supraclavicular lymphadenopathy.  NEUROLOGIC: Patient is oriented x3 with no focal or lateralizing neurologic  deficits.  PSYCH: Patients affect is appropriate, there is no evidence of anxiety or depression.  SKIN: Warm and dry; no lesions or wounds.   DATA REVIEW  ECG: Atrial fibrillation as per my personal interpretation  ECHO: Preserved LV function on 07/08/2022.  RV not well-visualized as per my personal interpretation  ASSESSMENT & PLAN:  Severe volume overload/persistent hypotension -Echocardiogram from 07/08/2022 demonstrates preserved LV function.  The right ventricle is not well-visualized but appears to have relatively preserved systolic function. -Continue IV Lasix 60 mg twice daily -Plan for right heart cath tomorrow.  Based on visible exam alone I am unable to gauge how much intravascular volume M[place] ankle has.  Unable to identify IVC by bedside ultrasound due to body habitus.  He has 3+ pitting edema however chest x-ray and BNP do not suggest severe volume overload.  Will plan for her invasive hemodynamics to evaluate for RV failure and filling pressures. -He is likely 3rd spacing due to hypoalbunemia (albumin 2).  -Would hold off on florinef for today; BP is 88/58 and it has not appeared to help his condition significantly.   2. Persistent atrial fibrillation - Rate controlled - Amiodarone 400mg  BID for now.  - Continue apixaban   3. Hypotension  - Likely due to underlying autonomic dysfunction. I also suspect cuff pressures are not accurate (measuring [id number] systolics).  - Continue midodrine 15mg  TID - [name] use vasopressin if he becomes persistently hypotensive.   Diamone Whistler Advanced Heart Failure Mechanical Circulatory Support

## 2022-09-09 DIAGNOSIS — R570 Cardiogenic shock: Secondary | ICD-10-CM | POA: Diagnosis not present

## 2022-09-09 LAB — BASIC METABOLIC PANEL
Anion gap: 9 (ref 5–15)
BUN: 17 mg/dL (ref 8–23)
CO2: 30 mmol/L (ref 22–32)
Calcium: 7.1 mg/dL — ABNORMAL LOW (ref 8.9–10.3)
Chloride: 98 mmol/L (ref 98–111)
Creatinine, Ser: 1.08 mg/dL (ref 0.61–1.24)
GFR, Estimated: >60 mL/min (ref >60)
Glucose, Bld: 83 mg/dL (ref 70–99)
Potassium: 3.1 mmol/L — ABNORMAL LOW (ref 3.5–5.1)
Sodium: 137 mmol/L (ref 135–145)

## 2022-09-09 LAB — CBC WITH DIFFERENTIAL/PLATELET
Abs Immature Granulocytes: 0.03 10*3/uL (ref 0.00–0.07)
Basophils Absolute: 0 10*3/uL (ref 0.0–0.1)
Basophils Relative: 0 %
Eosinophils Absolute: 0 10*3/uL (ref 0.0–0.5)
Eosinophils Relative: 0 %
HCT: 33.8 % — ABNORMAL LOW (ref 39.0–52.0)
Hemoglobin: 11.1 g/dL — ABNORMAL LOW (ref 13.0–17.0)
Immature Granulocytes: 0 %
Lymphocytes Relative: 40 %
Lymphs Abs: 3.5 10*3/uL (ref 0.7–4.0)
MCH: 27.7 pg (ref 26.0–34.0)
MCHC: 32.8 g/dL (ref 30.0–36.0)
MCV: 84.3 fL (ref 80.0–100.0)
Monocytes Absolute: 1.1 10*3/uL — ABNORMAL HIGH (ref 0.1–1.0)
Monocytes Relative: 12 %
Neutro Abs: 4.1 10*3/uL (ref 1.7–7.7)
Neutrophils Relative %: 48 %
Platelets: 201 10*3/uL (ref 150–400)
RBC: 4.01 MIL/uL — ABNORMAL LOW (ref 4.22–5.81)
RDW: 17.2 % — ABNORMAL HIGH (ref 11.5–15.5)
WBC: 8.7 10*3/uL (ref 4.0–10.5)
nRBC: 0 % (ref 0.0–0.2)

## 2022-09-09 LAB — PHOSPHORUS: Phosphorus: 3.5 mg/dL (ref 2.5–4.6)

## 2022-09-09 LAB — MAGNESIUM: Magnesium: 1.9 mg/dL (ref 1.7–2.4)

## 2022-09-09 MED ORDER — FUROSEMIDE 10 MG/ML IJ SOLN
80.0000 mg | Freq: Two times a day (BID) | INTRAMUSCULAR | Status: DC
  Administered 2022-09-09 – 2022-09-10 (×2): 80 mg via INTRAVENOUS
  Filled 2022-09-09 (×2): qty 8

## 2022-09-09 MED ORDER — POTASSIUM CHLORIDE CRYS ER 20 MEQ PO TBCR
40.0000 meq | EXTENDED_RELEASE_TABLET | Freq: Two times a day (BID) | ORAL | Status: AC
  Administered 2022-09-09 (×2): 40 meq via ORAL
  Filled 2022-09-09 (×2): qty 2

## 2022-09-09 MED ORDER — POTASSIUM CHLORIDE CRYS ER 20 MEQ PO TBCR: 40.0000 meq | EXTENDED_RELEASE_TABLET | ORAL | Status: DC

## 2022-09-09 MED ORDER — MAGNESIUM SULFATE 2 GM/50ML IV SOLN
2.0000 g | Freq: Once | INTRAVENOUS | Status: AC
  Administered 2022-09-09: 2 g via INTRAVENOUS
  Filled 2022-09-09: qty 50

## 2022-09-09 MED ORDER — NYSTATIN 100000 UNIT/GM EX POWD
Freq: Every day | CUTANEOUS | Status: DC
  Filled 2022-09-09: qty 15

## 2022-09-09 MED ORDER — ACETAZOLAMIDE SODIUM 500 MG IJ SOLR
500.0000 mg | Freq: Once | INTRAMUSCULAR | Status: AC
  Administered 2022-09-09: 500 mg via INTRAVENOUS
  Filled 2022-09-09: qty 500

## 2022-09-09 MED ORDER — SPIRONOLACTONE 25 MG PO TABS
25.0000 mg | ORAL_TABLET | Freq: Every day | ORAL | Status: DC
  Administered 2022-09-09 – 2022-09-10 (×2): 25 mg via ORAL
  Filled 2022-09-09 (×2): qty 1

## 2022-09-09 MED ORDER — POTASSIUM CHLORIDE 20 MEQ PO PACK: 60.0000 meq | PACK | Freq: Once | ORAL | Status: DC

## 2022-09-09 NOTE — Consult Note (Signed)
PHARMACY CONSULT NOTE - FOLLOW UP  Pharmacy Consult for Electrolyte Monitoring and Replacement   Recent Labs: Potassium (mmol/L)  Date Value  09/09/2022 3.1 (L)   Magnesium (mg/dL)  Date Value  09/09/2022 1.9   Calcium (mg/dL)  Date Value  09/09/2022 7.1 (L)   Albumin (g/dL)  Date Value  08/25/2022 2.1 (L)   Phosphorus (mg/dL)  Date Value  09/09/2022 3.5   Sodium (mmol/L)  Date Value  09/09/2022 137  07/04/2022 141     Assessment: 77 year old male with a PMH of PVD  with chronic 3+ BLLE,chronic hypoxemic respiratory failure on 4 L oxygen at home,  and recent diagnosis of atrial fibrillation started on apixaban and amiodarone outpatient, presents with SOB, hypotension, and vascular congestion. Pharmacy consulted to monitor and replace electrolytes while in ICU.  Meds:  Lasix 60 mg IV BID started 3/18  Goal of Therapy:  K+ 4.0-5.1; Mg 2-2.4 ISO afib All other electrolytes WNL  Plan:  Potassium chloride 40 mEq PO x 2 Magnesium sulfate 2 g IV x 1 Recheck electrolytes with AM labs   Delena Bali, PharmD Clinical Pharmacist   09/09/2022 7:04 AM

## 2022-09-09 NOTE — H&P (View-Only) (Signed)
ADVANCED HEART FAILURE CONSULT NOTE  Referring Physician: No ref. provider found  Primary Care: Pcp, No  HPI: Ernest [name] is a 77 y.o. male with persistent atrial fibrillation status post cardioversion in February 2024, hypertension, morbid obesity, chronic respiratory failure on home O2 that currently lives in a skilled nursing facility presenting as a direct admission from cardiology clinic due to hypotension, decompensated heart failure and atrial fibrillation with RVR. Over the past 2 months Mr. Ernest [name] has had 2 prolonged admissions at [facility].  He was admitted from February 2 to February 9 for orthostatic hypotension and syncope.  During that admission he was started on midodrine 15 mg 3 times daily, Florinef 20mg  and had an abdominal binder placed.  He was sent to rehab but unfortunately had another fall there.  He was once again admitted to [facility] where he had an extensive evaluation for hypotension including workup for VIPoma.  Workup was essentially unremarkable.  Interval hx / Subjective - Feels that his mentation has improved since yesterday  - 2.3L urine output, negative 2.2 L since admit late yesterday evening. sCr now normal.   PHYSICAL EXAM: Vitals:   09/09/22 0400 09/09/22 0500  BP:  92/68  Pulse:  68  Resp:  19  Temp:    SpO2: 100% 98%   GENERAL: elderly male, NAD.  HEENT: Negative for arcus senilis or xanthelasma. There is no scleral icterus.  The mucous membranes are pink and moist.   NECK: Supple, No masses. Normal carotid upstrokes without bruits. No masses or thyromegaly.    CHEST: There are no chest wall deformities. There is no chest wall tenderness. Respirations are unlabored.  Lungs-decreased lung sounds bilaterally CARDIAC:  JVP: 10-11         Normal S1, S2  Normal rate with irregular rhythm. No murmurs, rubs or gallops.  Pulses are 2+ and symmetrical in upper and lower extremities.  1-2+ edema to the thighs.  ABDOMEN: Soft, non-tender, non-distended. There  are no masses or hepatomegaly. There are normal bowel sounds.  EXTREMITIES: Warm and well perfused with no cyanosis, clubbing.  LYMPHATIC: No axillary or supraclavicular lymphadenopathy.  NEUROLOGIC: Patient is oriented x3 with no focal or lateralizing neurologic deficits.  PSYCH: Patients affect is appropriate, there is no evidence of anxiety or depression.  SKIN: Warm and dry; no lesions or wounds.   DATA REVIEW  ECG: Atrial fibrillation as per my personal interpretation  ECHO: Preserved LV function on 07/08/2022.  RV not well-visualized as per my personal interpretation  ASSESSMENT & PLAN:  Severe volume overload/persistent hypotension -Echocardiogram from 07/08/2022 demonstrates preserved LV function.  The right ventricle is not well-visualized but appears to have relatively preserved systolic function. -Plan for right heart cath today or tomorrow.  Based on exam alone I am unable to gauge how much intravascular volume he has.  Unable to identify IVC by bedside ultrasound due to body habitus.  HChest x-ray and BNP do not suggest severe volume overload. He is likely 3rd spacing due to hypoalbunemia (albumin 2).  -Would hold off on florinef. - 2.3L urine output over the past 24h with improvement in sCr to 1.08. Will continue diuresis with IV lasix 80mg  BID & diamox 500mg  x 1.   2. Persistent atrial fibrillation - Rate controlled - Amiodarone 400mg  BID for now.  - Continue apixaban   3. Hypotension  - Likely due to underlying autonomic dysfunction. I also suspect cuff pressures are not accurate (measuring [id number] systolics).  - Continue midodrine 15mg   TID - [name] use vasopressin if he becomes persistently hypotensive.  - Bps stable in the upper [id number], low 0000000 systolic.   Ernest [name] Advanced Heart Failure Mechanical Circulatory Support

## 2022-09-09 NOTE — Progress Notes (Signed)
CRITICAL CARE    Name: Ernest [name] MRN: [id number] DOB: [birth year]     LOS: 1   SUBJECTIVE FINDINGS & SIGNIFICANT EVENTS    History of presenting illness 77 year old male with a PMH of morbid obesity with a BMI of over 39, peripheral vascular disease with chronic 3+ lower extremity edema on Lasix at home, GERD, OSA with obesity hypoventilation, does have chronic hypoxemic respiratory failure and utilizes 4 L of supplemental oxygen at home, essential hypertension, recent diagnosis of atrial fibrillation has been seen by cardiology placed on antiarrhythmic agents with amiodarone and anticoagulation with Eliquis.  Also does have a background history of orthostatic hypotension uses midodrine at home.  He was seen in cardiology clinic with lightheadedness presyncope and disequilibrium asked by staff to be seen in emergency department for hospitalization and additional evaluation.  Wife is at bedside with report of progressively worsening dyspnea, in the ED he was found to be hypotensive despite taking his midodrine, was on home 4 L of oxygen with saturation over 90% does have mild CKD without AKI BNP was ordered which was mildly elevated in the context morbid obesity.  He does have compression stockings on with 3+ lower extremity pitting edema.  He was evaluated by cardiology and was placed on vasopressor support with pulmonary critical care consultation placed for additional evaluation management while in stepdown.  09/09/22- patient is awake and stable, plan for cardiac cath today. He's clinically improved from yesterday. Elelctrolytes repleting also improved. Resolution of leukocytosis on CBC. RVP negative for viral etiology of shock, UA negative for UTI associated septic shock.   Lines/tubes :   Microbiology/Sepsis  markers: Results for orders placed or performed during the hospital encounter of 08/23/2022  MRSA Next Gen by PCR, Nasal     Status: None   Collection Time: 08/25/2022  4:46 PM   Specimen: Nasal Mucosa; Nasal Swab  Result Value Ref Range Status   MRSA by PCR Next Gen NOT DETECTED NOT DETECTED Final    Comment: (NOTE) The GeneXpert MRSA Assay (FDA approved for NASAL specimens only), is one component of a comprehensive MRSA colonization surveillance program. It is not intended to diagnose MRSA infection nor to guide or monitor treatment for MRSA infections. Test performance is not FDA approved in patients less than 77 years old. Performed at [facility], [address] [place], [place] [address]   Resp panel by RT-PCR (RSV, Flu A&B, Covid)     Status: None   Collection Time: 09/07/2022  4:56 PM  Result Value Ref Range Status   SARS Coronavirus 2 by RT PCR NEGATIVE NEGATIVE Final    Comment: (NOTE) SARS-CoV-2 target nucleic acids are NOT DETECTED.  The SARS-CoV-2 RNA is generally detectable in upper respiratory specimens during the acute phase of infection. The lowest concentration of SARS-CoV-2 viral copies this assay can detect is 138 copies/mL. A negative result does not preclude SARS-Cov-2 infection and should not be used as the sole basis for treatment or other patient management decisions. A negative result may occur with  improper specimen collection/handling, submission of specimen other than nasopharyngeal swab, presence of viral mutation(s) within the areas targeted by this assay, and inadequate number of viral copies(<138 copies/mL). A negative result must be combined with clinical observations, patient history, and epidemiological information. The expected result is Negative.  Fact Sheet for Patients:  EntrepreneurPulse.com.au  Fact Sheet for Healthcare Providers:  IncredibleEmployment.be  This test is no t yet approved or  cleared by the [place]  States FDA and  has been authorized for detection and/or diagnosis of SARS-CoV-2 by FDA under an Emergency Use Authorization (EUA). This EUA will remain  in effect (meaning this test can be used) for the duration of the COVID-19 declaration under Section 564(b)(1) of the Act, 21 U.S.C.section 360bbb-3(b)(1), unless the authorization is terminated  or revoked sooner.       Influenza A by PCR NEGATIVE NEGATIVE Final   Influenza B by PCR NEGATIVE NEGATIVE Final    Comment: (NOTE) The Xpert Xpress SARS-CoV-2/FLU/RSV plus assay is intended as an aid in the diagnosis of influenza from Nasopharyngeal swab specimens and should not be used as a sole basis for treatment. Nasal washings and aspirates are unacceptable for Xpert Xpress SARS-CoV-2/FLU/RSV testing.  Fact Sheet for Patients: EntrepreneurPulse.com.au  Fact Sheet for Healthcare Providers: IncredibleEmployment.be  This test is not yet approved or cleared by the [place] FDA and has been authorized for detection and/or diagnosis of SARS-CoV-2 by FDA under an Emergency Use Authorization (EUA). This EUA will remain in effect (meaning this test can be used) for the duration of the COVID-19 declaration under Section 564(b)(1) of the Act, 21 U.S.C. section 360bbb-3(b)(1), unless the authorization is terminated or revoked.     Resp Syncytial Virus by PCR NEGATIVE NEGATIVE Final    Comment: (NOTE) Fact Sheet for Patients: EntrepreneurPulse.com.au  Fact Sheet for Healthcare Providers: IncredibleEmployment.be  This test is not yet approved or cleared by the [place] FDA and has been authorized for detection and/or diagnosis of SARS-CoV-2 by FDA under an Emergency Use Authorization (EUA). This EUA will remain in effect (meaning this test can be used) for the duration of the COVID-19 declaration under Section 564(b)(1) of the Act, 21  U.S.C. section 360bbb-3(b)(1), unless the authorization is terminated or revoked.  Performed at [facility], [place], [place] [address]   Respiratory (~20 pathogens) panel by PCR     Status: None   Collection Time: 09/15/2022  4:56 PM  Result Value Ref Range Status   Adenovirus NOT DETECTED NOT DETECTED Final   Coronavirus 229E NOT DETECTED NOT DETECTED Final    Comment: (NOTE) The Coronavirus on the Respiratory Panel, DOES NOT test for the novel  Coronavirus (2019 nCoV)    Coronavirus HKU1 NOT DETECTED NOT DETECTED Final   Coronavirus NL63 NOT DETECTED NOT DETECTED Final   Coronavirus OC43 NOT DETECTED NOT DETECTED Final   Metapneumovirus NOT DETECTED NOT DETECTED Final   Rhinovirus / Enterovirus NOT DETECTED NOT DETECTED Final   Influenza A NOT DETECTED NOT DETECTED Final   Influenza B NOT DETECTED NOT DETECTED Final   Parainfluenza Virus 1 NOT DETECTED NOT DETECTED Final   Parainfluenza Virus 2 NOT DETECTED NOT DETECTED Final   Parainfluenza Virus 3 NOT DETECTED NOT DETECTED Final   Parainfluenza Virus 4 NOT DETECTED NOT DETECTED Final   Respiratory Syncytial Virus NOT DETECTED NOT DETECTED Final   Bordetella pertussis NOT DETECTED NOT DETECTED Final   Bordetella Parapertussis NOT DETECTED NOT DETECTED Final   Chlamydophila pneumoniae NOT DETECTED NOT DETECTED Final   Mycoplasma pneumoniae NOT DETECTED NOT DETECTED Final    Comment: Performed at [facility], [address] [place], [place] [address]    Anti-infectives:  Anti-infectives (From admission, onward)    None        Consults: Treatment Team:  Wellington Hampshire, MD    PAST MEDICAL HISTORY   Past Medical History:  Diagnosis Date   Anemia  Arthritis    osteoarthritis - bilateral knees   Chronic venous insufficiency of lower extremity    Diastolic dysfunction    a. 12/2020 Echo: EF 50-55%, GrI DD; b. 06/2022 Echo: EF 60-65%, no rwma, nl RV fxn, nl LA size, mildly dil  RA.   Dyspnea    Essential hypertension    GERD (gastroesophageal reflux disease)    Hypotension    a. 06/2022 midodrine added.   Obesity hypoventilation syndrome (HCC)    Persistent atrial fibrillation ([place])    a. Dx 06/2022-->amio/eliquis. CHA2DS2VASc = 3.   Sleep apnea    Small bowel obstruction ([place]) 2022   Uses roller walker      SURGICAL HISTORY   Past Surgical History:  Procedure Laterality Date   CARDIOVERSION N/A 07/28/2022   Procedure: CARDIOVERSION;  Surgeon: Wellington Hampshire, MD;  Location: [facility];  Service: Cardiovascular;  Laterality: N/A;   CATARACT EXTRACTION W/PHACO Left 08/06/2020   Procedure: CATARACT EXTRACTION PHACO AND INTRAOCULAR LENS PLACEMENT ([place]) LEFT;  Surgeon: Eulogio Bear, MD;  Location: [place];  Service: Ophthalmology;  Laterality: Left;  3.41 0:31.5   CATARACT EXTRACTION W/PHACO Right 08/27/2020   Procedure: CATARACT EXTRACTION PHACO AND INTRAOCULAR LENS PLACEMENT ([facility]) RIGHT 4.10 00:41.6;  Surgeon: Eulogio Bear, MD;  Location: [place];  Service: Ophthalmology;  Laterality: Right;   COLONOSCOPY WITH ESOPHAGOGASTRODUODENOSCOPY (EGD)     COLONOSCOPY WITH PROPOFOL N/A 11/01/2020   Procedure: COLONOSCOPY WITH PROPOFOL;  Surgeon: Lin Landsman, MD;  Location: [facility]OSCOPY;  Service: Gastroenterology;  Laterality: N/A;   COLONOSCOPY WITH PROPOFOL N/A 03/18/2022   Procedure: COLONOSCOPY WITH BIOPSIES;  Surgeon: Lin Landsman, MD;  Location: [place];  Service: Endoscopy;  Laterality: N/A;   ESOPHAGOGASTRODUODENOSCOPY (EGD) WITH PROPOFOL N/A 11/01/2020   Procedure: ESOPHAGOGASTRODUODENOSCOPY (EGD) WITH PROPOFOL;  Surgeon: Lin Landsman, MD;  Location: [facility]OSCOPY;  Service: Gastroenterology;  Laterality: N/A;   HERNIA REPAIR     inguinal and umbilical   POLYPECTOMY  03/18/2022   Procedure: POLYPECTOMY;  Surgeon: Lin Landsman, MD;  Location: [place];  Service: Endoscopy;;      FAMILY HISTORY   Family History  Problem Relation Age of Onset   Esophageal cancer Father    Fibromyalgia Sister      SOCIAL HISTORY   Social History   Tobacco Use   Smoking status: Never   Smokeless tobacco: Never   Tobacco comments:    Never smoke 07/14/22  Substance Use Topics   Alcohol use: Yes    Alcohol/week: 0.0 standard drinks of alcohol    Comment: rarely   Drug use: Not Currently     MEDICATIONS   Current Medication:  Current Facility-Administered Medications:    0.9 %  sodium chloride infusion, 250 mL, Intravenous, PRN, Idolina Primer, Ryan M, PA-C   0.9 %  sodium chloride infusion, , Intravenous, Continuous, Dunn, Ryan M, PA-C, Last Rate: 10 mL/hr at 09/09/22 0540, New Bag at 09/09/22 0540   acetaZOLAMIDE (DIAMOX) injection 500 mg, 500 mg, Intravenous, Once, Sabharwal, Aditya, DO   amiodarone (PACERONE) tablet 400 mg, 400 mg, Oral, BID, Djan, Prince T, MD, 400 mg at 09/09/22 0820   Chlorhexidine Gluconate Cloth 2 % PADS 6 each, 6 each, Topical, Daily, Djan, Gordy Councilman, MD, 6 each at 09/09/22 0825   cyanocobalamin (VITAMIN B12) tablet 1,000 mcg, 1,000 mcg, Oral, Daily, Djan, Prince T, MD   dicyclomine (BENTYL) capsule 10 mg, 10 mg, Oral, TID PRN, Lang Snow, NP,  10 mg at 09/20/2022 2203   diphenhydrAMINE (BENADRYL) capsule 25 mg, 25 mg, Oral, Once, Foust, Katy L, NP   furosemide (LASIX) injection 80 mg, 80 mg, Intravenous, BID, Sabharwal, Aditya, DO   magnesium sulfate IVPB 2 g 50 mL, 2 g, Intravenous, Once, Delena Bali, [facility], Last Rate: 50 mL/hr at 09/09/22 0830, 2 g at 09/09/22 0830   midodrine (PROAMATINE) tablet 15 mg, 15 mg, Oral, TID WC, Sabharwal, Aditya, DO, 15 mg at 09/09/22 0820   Oral care mouth rinse, 15 mL, Mouth Rinse, PRN, Verline Lema, MD   [START ON 09/01/2022] pantoprazole (PROTONIX) EC tablet 40 mg, 40 mg, Oral, Q M,W,F, Djan, Prince T, MD   potassium chloride SA (KLOR-CON M) CR tablet 40 mEq, 40 mEq, Oral, BID, Delena Bali, RPH   sodium chloride flush (NS) 0.9 % injection 3 mL, 3 mL, Intravenous, Q12H, Dunn, Ryan M, PA-C, 3 mL at 09/09/22 0825   sodium chloride flush (NS) 0.9 % injection 3 mL, 3 mL, Intravenous, PRN, Rise Mu, PA-C    ALLERGIES   Patient has no known allergies.    REVIEW OF SYSTEMS    10 point review of systems is done and is negative except as per subjective findings in HPI  PHYSICAL EXAMINATION   Vital Signs: Temp:  [97.6 F (36.4 C)-98.2 F (36.8 C)] 98.1 F (36.7 C) (03/19 0200) Pulse Rate:  [68-129] 68 (03/19 0500) Resp:  [13-27] 19 (03/19 0500) BP: (67-105)/(47-78) 92/68 (03/19 0500) SpO2:  [93 %-100 %] 98 % (03/19 0500) Weight:  [132.1 kg-134.5 kg] 132.1 kg (03/19 0500)  GENERAL: Morbidly obese age-appropriate HEAD: Normocephalic, atraumatic.  EYES: Pupils equal, round, reactive to light.  No scleral icterus.  MOUTH: Moist mucosal membrane.  Poor dentition NECK: Supple. No thyromegaly. No nodules. No JVD.  PULMONARY: Patent expiratory crackles worse posteriorly CARDIOVASCULAR: S1 and S2. Regular rate and rhythm. No murmurs, rubs, or gallops.  GASTROINTESTINAL: Soft, nontender, non-distended. No masses. Positive bowel sounds. No hepatosplenomegaly.  MUSCULOSKELETAL: No swelling, clubbing, or edema.  NEUROLOGIC: Mild distress due to acute illness SKIN:intact,warm,dry   PERTINENT DATA     Infusions:  sodium chloride     sodium chloride 10 mL/hr at 09/09/22 0540   magnesium sulfate bolus IVPB 2 g (09/09/22 0830)   Scheduled Medications:  acetaZOLAMIDE  500 mg Intravenous Once   amiodarone  400 mg Oral BID   Chlorhexidine Gluconate Cloth  6 each Topical Daily   cyanocobalamin  1,000 mcg Oral Daily   diphenhydrAMINE  25 mg Oral Once   furosemide  80 mg Intravenous BID   midodrine  15 mg Oral TID WC   [START ON 08/23/2022] pantoprazole  40 mg Oral Q M,W,F   potassium chloride  40 mEq Oral BID   sodium chloride flush  3 mL Intravenous Q12H    PRN Medications: sodium chloride, dicyclomine, mouth rinse, sodium chloride flush Hemodynamic parameters:   Intake/Output: 03/18 0701 - 03/19 0700 In: 150 [IV Piggyback:150] Out: 2375 [Urine:2375]  Ventilator  Settings:    LAB RESULTS:  Basic Metabolic Panel: Recent Labs  Lab 08/22/2022 1205 09/09/22 0615  NA 138 137  K 3.1* 3.1*  CL 97* 98  CO2 26 30  GLUCOSE 100* 83  BUN 18 17  CREATININE 1.30* 1.08  CALCIUM 7.5* 7.1*  MG 1.5* 1.9  PHOS  --  3.5    Liver Function Tests: No results for input(s): "AST", "ALT", "ALKPHOS", "BILITOT", "PROT", "ALBUMIN" in the last 168 hours.  No results for input(s): "LIPASE", "AMYLASE" in the last 168 hours. No results for input(s): "AMMONIA" in the last 168 hours. CBC: Recent Labs  Lab 09/20/2022 1205 09/09/22 0615  WBC 15.2* 8.7  NEUTROABS  --  4.1  HGB 12.5* 11.1*  HCT 38.4* 33.8*  MCV 85.3 84.3  PLT 340 201    Cardiac Enzymes: No results for input(s): "CKTOTAL", "CKMB", "CKMBINDEX", "TROPONINI" in the last 168 hours. BNP: Invalid input(s): "POCBNP" CBG: Recent Labs  Lab 09/21/2022 1645  GLUCAP 93       IMAGING RESULTS:  Imaging: DG Chest Port 1 View  Result Date: 09/19/2022 CLINICAL DATA:  Shortness of breath EXAM: PORTABLE CHEST 1 VIEW COMPARISON:  X-ray 08/17/2022 FINDINGS: Enlarged cardiopericardial silhouette with calcified and tortuous aorta. Central vascular congestion. Tiny pleural effusions. No pneumothorax or edema. Overlapping cardiac leads. Curvature and degenerative changes along the spine. Underinflation. Film is under penetrated IMPRESSION: Enlarged cardiopericardial silhouette with vascular congestion and tiny effusions. The inferior right costophrenic angle is clipped off the edge of the film Electronically Signed   By: Jill Side M.D.   On: 09/17/2022 12:30   @PROBHOSP @ DG Chest Port 1 View  Result Date: 08/26/2022 CLINICAL DATA:  Shortness of breath EXAM: PORTABLE CHEST 1 VIEW COMPARISON:  X-ray  08/17/2022 FINDINGS: Enlarged cardiopericardial silhouette with calcified and tortuous aorta. Central vascular congestion. Tiny pleural effusions. No pneumothorax or edema. Overlapping cardiac leads. Curvature and degenerative changes along the spine. Underinflation. Film is under penetrated IMPRESSION: Enlarged cardiopericardial silhouette with vascular congestion and tiny effusions. The inferior right costophrenic angle is clipped off the edge of the film Electronically Signed   By: Jill Side M.D.   On: 09/16/2022 12:30        ASSESSMENT AND PLAN    -Multidisciplinary rounds held today  Acute on chronic hypoxic Respiratory Failure -Possibly due to atelectasis with interstitial edema secondary to cardiac dysfunction versus lower respiratory tract infection.  Patient does have leukocytosis which may be indicative of active infection.  He is currently not on systemic steroids nor takes steroids at home. -Chest x-ray with blunted cardiophrenic and bilateral costophrenic angles with cephalization suggestive of pleural effusions with pulmonary interstitial edema, no grossly apparent consolidated infiltrate.  Notable cardiomegaly -Diuresis as able -Viral testing including COVID and non-COVID panel Most recent transthoracic echo performed July 08, 2022 with RV and RA enlargement but normal LVEF.  Acute on chronic decompensated diastolic heart failure with rapid atrial fibrillation -Status post cardiology evaluation-appreciate input decompensated heart failure with atrial fibrillation with RVR -lasix 60 bid ordered  -vasopressor support as per cardiology    Renal Failure-KDIGO stage 2 - acute on chronic -follow chem 7 -follow UO -continue Foley Catheter-assess need daily -dc non essential nephrotoxins  Obstructive sleep apnea with pickwickian and OHS overlap syndrome  - May benefit from CPAP overnight with respiratory therapy for settings  GI/Nutrition GI PROPHYLAXIS as  indicated DIET-->TF's as tolerated Constipation protocol as indicated  ENDO - ICU hypoglycemic\Hyperglycemia protocol -check FSBS per protocol   ELECTROLYTES -follow labs as needed -replace as needed -pharmacy consultation   DVT/GI PRX ordered -SCDs  TRANSFUSIONS AS NEEDED MONITOR FSBS ASSESS the need for LABS as needed  Critical care provider statement:   Total critical care time: 33 minutes   Performed by: Lanney Gins MD   Critical care time was exclusive of separately billable procedures and treating other patients.   Critical care was necessary to treat or prevent imminent or life-threatening deterioration.   Critical care was  time spent personally by me on the following activities: development of treatment plan with patient and/or surrogate as well as nursing, discussions with consultants, evaluation of patient's response to treatment, examination of patient, obtaining history from patient or surrogate, ordering and performing treatments and interventions, ordering and review of laboratory studies, ordering and review of radiographic studies, pulse oximetry and re-evaluation of patient's condition.    Ottie Glazier, M.D.  Pulmonary & Critical Care Medicine

## 2022-09-09 NOTE — Progress Notes (Signed)
ADVANCED HEART FAILURE CONSULT NOTE  Referring Physician: No ref. provider found  Primary Care: Pcp, No  HPI: Ernest [name] is a 77 y.o. male with persistent atrial fibrillation status post cardioversion in February 2024, hypertension, morbid obesity, chronic respiratory failure on home O2 that currently lives in a skilled nursing facility presenting as a direct admission from cardiology clinic due to hypotension, decompensated heart failure and atrial fibrillation with RVR. Over the past 2 months Ernest [name] has had 2 prolonged admissions at [facility].  He was admitted from February 2 to February 9 for orthostatic hypotension and syncope.  During that admission he was started on midodrine 15 mg 3 times daily, Florinef 20mg  and had an abdominal binder placed.  He was sent to rehab but unfortunately had another fall there.  He was once again admitted to [facility] where he had an extensive evaluation for hypotension including workup for VIPoma.  Workup was essentially unremarkable.  Interval hx / Subjective - Feels that his mentation has improved since yesterday  - 2.3L urine output, negative 2.2 L since admit late yesterday evening. sCr now normal.   PHYSICAL EXAM: Vitals:   09/09/22 0400 09/09/22 0500  BP:  92/68  Pulse:  68  Resp:  19  Temp:    SpO2: 100% 98%   GENERAL: elderly male, NAD.  HEENT: Negative for arcus senilis or xanthelasma. There is no scleral icterus.  The mucous membranes are pink and moist.   NECK: Supple, No masses. Normal carotid upstrokes without bruits. No masses or thyromegaly.    CHEST: There are no chest wall deformities. There is no chest wall tenderness. Respirations are unlabored.  Lungs-decreased lung sounds bilaterally CARDIAC:  JVP: 10-11         Normal S1, S2  Normal rate with irregular rhythm. No murmurs, rubs or gallops.  Pulses are 2+ and symmetrical in upper and lower extremities.  1-2+ edema to the thighs.  ABDOMEN: Soft, non-tender, non-distended. There  are no masses or hepatomegaly. There are normal bowel sounds.  EXTREMITIES: Warm and well perfused with no cyanosis, clubbing.  LYMPHATIC: No axillary or supraclavicular lymphadenopathy.  NEUROLOGIC: Patient is oriented x3 with no focal or lateralizing neurologic deficits.  PSYCH: Patients affect is appropriate, there is no evidence of anxiety or depression.  SKIN: Warm and dry; no lesions or wounds.   DATA REVIEW  ECG: Atrial fibrillation as per my personal interpretation  ECHO: Preserved LV function on 07/08/2022.  RV not well-visualized as per my personal interpretation  ASSESSMENT & PLAN:  Severe volume overload/persistent hypotension -Echocardiogram from 07/08/2022 demonstrates preserved LV function.  The right ventricle is not well-visualized but appears to have relatively preserved systolic function. -Plan for right heart cath today or tomorrow.  Based on exam alone I am unable to gauge how much intravascular volume he has.  Unable to identify IVC by bedside ultrasound due to body habitus.  HChest x-ray and BNP do not suggest severe volume overload. He is likely 3rd spacing due to hypoalbunemia (albumin 2).  -Would hold off on florinef. - 2.3L urine output over the past 24h with improvement in sCr to 1.08. Will continue diuresis with IV lasix 80mg  BID & diamox 500mg  x 1.   2. Persistent atrial fibrillation - Rate controlled - Amiodarone 400mg  BID for now.  - Continue apixaban   3. Hypotension  - Likely due to underlying autonomic dysfunction. I also suspect cuff pressures are not accurate (measuring 123XX123 systolics).  - Continue midodrine 15mg   TID - [name] use vasopressin if he becomes persistently hypotensive.  - Bps stable in the upper 123XX123, low 0000000 systolic.   Ernest [name] Advanced Heart Failure Mechanical Circulatory Support

## 2022-09-09 NOTE — Progress Notes (Signed)
Progress Note   Patient: Ernest [name] [id number] DOB: 07/27/1945 DOA: 08/28/2022     1 DOS: the patient was seen and examined on 09/09/2022   Subjective: Patient seen and examined at bedside this morning Currently diuresing very well Blood pressure sustaining around 123XX123 to 0000000 systolic Denies chest pain nausea vomiting or abdominal pain   Brief hospital course: GEOF PIMENTA II is a 77 y.o. male with medical history significant of class III obesity, chronic venous insufficiency of lower extremities on Lasix, GERD, obesity hypoventilation syndrome and chronic respiratory failure on home O2 at 4L, HTN, recent diagnosis of A-fib, on amiodarone and Eliquis who underwent cardioversion in January 2024, chronic hypotension on midodrine, who presents to the ED after he went for a follow-up visit with his cardiologist today and was directed to come to the emergency room for further management. While at his cardiologist office patient was found to have dizziness after he had sat down for about 3 and half hours with associated shortness of breath. His cardiologist found him to have acute change in his condition and therefore told to come to the emergency room.  Cardiologist and intensivist  on board and monitoring for possible vasopressor need given borderline hypotension.  Assessment and Plan: Acute on chronic respiratory failure secondary to fluid overload in the setting of acute on chronic diastolic dysfunction Echo obtained January 2024 showed ejection fraction of 60 to [id number] however diastolic function was indeterminate. Cardiologist recommend continuation of IV Lasix 80 mg twice daily Planning on right heart catheterization tomorrow Monitor input and output Daily weight  Fluid restriction to 1.5 L/day   Acute kidney injury secondary to above-improved Continue above management  Acute on chronic persistent atrial fibrillation Currently rate controlled We will continue home dose of  apixaban We will continue amiodarone as recommended by cardiologist     Hypotension likely orthostatic hypotension versus cardiogenic shock Orthostatic hypotension Continue midodrine According to cardiologist vasopressin can also be used if blood pressure becomes low with systolic BP in the [id number] Cardiologist suspects BP cuff dysfunction Will continue to monitor blood pressure closely while in stepdown Intensivist on board on account of hypotension and possible vasopressor need    Vitamin B12 deficiency Continue vitamin B12 supplementation   Hypokalemia-continue repletion of potassium and monitoring   Morbid obesity-we will counsel on weight loss when medically stable   Obesity hypoventilation syndrome-continue current oxygen supplementation To treat obesity when medically stable   GERD Continue PPI therapy   Normocytic anemia Currently hemoglobin 12.5  no indication for acute transfusion at this time   Leukocytosis likely reactive Presented with WBC 15.2 No evidence of infection at this time although patient met SIRS criteria We will continue to monitor closely Chest x-ray reviewed by me showed pulmonary vascular congestion with no infiltrate detected I will obtain urinalysis   DVT prophylaxis-continue Eliquis           Physical Exam: GENERAL: Patient in mild respiratory distress requiring 4 L of intranasal oxygen NECK: Supple, No masses.  CHEST: Decreased breath sounds especially at the bases CARDIAC: Heart sounds 1 and 2 heard rate irregularly irregular ABDOMEN: Soft, obese but nondistended EXTREMITIES: Bilateral lower extremity pitting edema 3+ LYMPHATIC: No axillary or supraclavicular lymphadenopathy.  NEUROLOGIC: Patient is oriented x3 with no focal or lateralizing neurologic deficits.   Vitals:   09/09/22 0300 09/09/22 0400 09/09/22 0500 09/09/22 1001  BP:   92/68 102/74  Pulse:   68   Resp:   19  Temp:    98.2 F (36.8 C)  TempSrc:    Oral  SpO2:  100% 100% 98%   Weight:   132.1 kg   Height:         Author: Verline Lema, MD 09/09/2022 4:54 PM  For on call review [email or web address].

## 2022-09-10 ENCOUNTER — Encounter: Admission: EM | Disposition: E | Payer: Self-pay | Source: Skilled Nursing Facility | Attending: Internal Medicine

## 2022-09-10 DIAGNOSIS — R570 Cardiogenic shock: Secondary | ICD-10-CM | POA: Diagnosis not present

## 2022-09-10 HISTORY — PX: RIGHT HEART CATH: CATH118263

## 2022-09-10 LAB — POCT I-STAT EG7
Acid-Base Excess: 6 mmol/L — ABNORMAL HIGH (ref 0.0–2.0)
Acid-Base Excess: 7 mmol/L — ABNORMAL HIGH (ref 0.0–2.0)
Bicarbonate: 30.6 mmol/L — ABNORMAL HIGH (ref 20.0–28.0)
Bicarbonate: 31.2 mmol/L — ABNORMAL HIGH (ref 20.0–28.0)
Calcium, Ion: 1.09 mmol/L — ABNORMAL LOW (ref 1.15–1.40)
Calcium, Ion: 1.1 mmol/L — ABNORMAL LOW (ref 1.15–1.40)
HCT: 35 % — ABNORMAL LOW (ref 39.0–52.0)
HCT: 35 % — ABNORMAL LOW (ref 39.0–52.0)
Hemoglobin: 11.9 g/dL — ABNORMAL LOW (ref 13.0–17.0)
Hemoglobin: 11.9 g/dL — ABNORMAL LOW (ref 13.0–17.0)
O2 Saturation: 59 %
O2 Saturation: 60 %
Potassium: 3.2 mmol/L — ABNORMAL LOW (ref 3.5–5.1)
Potassium: 3.2 mmol/L — ABNORMAL LOW (ref 3.5–5.1)
Sodium: 138 mmol/L (ref 135–145)
Sodium: 139 mmol/L (ref 135–145)
TCO2: 32 mmol/L (ref 22–32)
TCO2: 33 mmol/L — ABNORMAL HIGH (ref 22–32)
pCO2, Ven: 42.8 mmHg — ABNORMAL LOW (ref 44–60)
pCO2, Ven: 43.4 mmHg — ABNORMAL LOW (ref 44–60)
pH, Ven: 7.462 — ABNORMAL HIGH (ref 7.25–7.43)
pH, Ven: 7.465 — ABNORMAL HIGH (ref 7.25–7.43)
pO2, Ven: 29 mmHg — CL (ref 32–45)
pO2, Ven: 30 mmHg — CL (ref 32–45)

## 2022-09-10 LAB — CBC WITH DIFFERENTIAL/PLATELET
Abs Immature Granulocytes: 0.02 10*3/uL (ref 0.00–0.07)
Basophils Absolute: 0 10*3/uL (ref 0.0–0.1)
Basophils Relative: 0 %
Eosinophils Absolute: 0 10*3/uL (ref 0.0–0.5)
Eosinophils Relative: 0 %
HCT: 33.4 % — ABNORMAL LOW (ref 39.0–52.0)
Hemoglobin: 10.9 g/dL — ABNORMAL LOW (ref 13.0–17.0)
Immature Granulocytes: 0 %
Lymphocytes Relative: 39 %
Lymphs Abs: 3.1 10*3/uL (ref 0.7–4.0)
MCH: 27.7 pg (ref 26.0–34.0)
MCHC: 32.6 g/dL (ref 30.0–36.0)
MCV: 84.8 fL (ref 80.0–100.0)
Monocytes Absolute: 0.9 10*3/uL (ref 0.1–1.0)
Monocytes Relative: 11 %
Neutro Abs: 3.9 10*3/uL (ref 1.7–7.7)
Neutrophils Relative %: 50 %
Platelets: 209 10*3/uL (ref 150–400)
RBC: 3.94 MIL/uL — ABNORMAL LOW (ref 4.22–5.81)
RDW: 17.4 % — ABNORMAL HIGH (ref 11.5–15.5)
WBC: 8 10*3/uL (ref 4.0–10.5)
nRBC: 0 % (ref 0.0–0.2)

## 2022-09-10 LAB — BASIC METABOLIC PANEL
Anion gap: 6 (ref 5–15)
BUN: 19 mg/dL (ref 8–23)
CO2: 29 mmol/L (ref 22–32)
Calcium: 6.7 mg/dL — ABNORMAL LOW (ref 8.9–10.3)
Chloride: 102 mmol/L (ref 98–111)
Creatinine, Ser: 1.22 mg/dL (ref 0.61–1.24)
GFR, Estimated: >60 mL/min (ref >60)
Glucose, Bld: 86 mg/dL (ref 70–99)
Potassium: 3.1 mmol/L — ABNORMAL LOW (ref 3.5–5.1)
Sodium: 137 mmol/L (ref 135–145)

## 2022-09-10 LAB — MAGNESIUM: Magnesium: 2.1 mg/dL (ref 1.7–2.4)

## 2022-09-10 LAB — PHOSPHORUS: Phosphorus: 3.6 mg/dL (ref 2.5–4.6)

## 2022-09-10 SURGERY — RIGHT HEART CATH
Anesthesia: Moderate Sedation

## 2022-09-10 MED ORDER — HEPARIN (PORCINE) IN NACL 1000-0.9 UT/500ML-% IV SOLN
INTRAVENOUS | Status: DC | PRN
  Administered 2022-09-10: 500 mL

## 2022-09-10 MED ORDER — POTASSIUM CHLORIDE CRYS ER 20 MEQ PO TBCR: 40.0000 meq | EXTENDED_RELEASE_TABLET | ORAL | Status: AC

## 2022-09-10 MED ORDER — POTASSIUM CHLORIDE CRYS ER 20 MEQ PO TBCR
40.0000 meq | EXTENDED_RELEASE_TABLET | Freq: Once | ORAL | Status: AC
  Administered 2022-09-10: 40 meq via ORAL
  Filled 2022-09-10: qty 2

## 2022-09-10 MED ORDER — CALCIUM GLUCONATE-NACL 2-0.675 GM/100ML-% IV SOLN
2.0000 g | Freq: Once | INTRAVENOUS | Status: AC
  Administered 2022-09-10: 2000 mg via INTRAVENOUS
  Filled 2022-09-10: qty 100

## 2022-09-10 SURGICAL SUPPLY — 9 items
CATH BALLN WEDGE 5F 110CM (CATHETERS) IMPLANT
DRAPE BRACHIAL (DRAPES) IMPLANT
KIT RIGHT HEART ACIST (MISCELLANEOUS) IMPLANT
KIT SYRINGE INJ CVI SPIKEX1 (MISCELLANEOUS) IMPLANT
PACK CARDIAC CATH (CUSTOM PROCEDURE TRAY) IMPLANT
PROTECTION STATION PRESSURIZED (MISCELLANEOUS) ×1
SHEATH GLIDE SLENDER 4/5FR (SHEATH) IMPLANT
STATION PROTECTION PRESSURIZED (MISCELLANEOUS) IMPLANT
WIRE NITINOL .018 (WIRE) IMPLANT

## 2022-09-10 NOTE — Progress Notes (Addendum)
0725 Report given to Nurse in Cardiac Cath Lab. 1005 Back to room. 59 Family meeting requested by patient and wife with Dr.Sabharwal, Dr Arida,Dr. Feliberto Gottron and T.Johnson case manager to help with patient's home vs skilled nursing care. Secure chat sent to all requesting this meeting. Dr.Sira called and informed nurse he was speaking with care manager about this issue.  46 Dr. Feliberto Gottron was the only MD to respond to request for MD/Patient meeting.

## 2022-09-10 NOTE — Consult Note (Signed)
PHARMACY CONSULT NOTE - FOLLOW UP  Pharmacy Consult for Electrolyte Monitoring and Replacement   Recent Labs: Potassium (mmol/L)  Date Value  09/10/2022 3.1 (L)   Magnesium (mg/dL)  Date Value  09/10/2022 2.1   Calcium (mg/dL)  Date Value  09/10/2022 6.7 (L)   Albumin (g/dL)  Date Value  08/25/2022 2.1 (L)   Phosphorus (mg/dL)  Date Value  09/10/2022 3.6   Sodium (mmol/L)  Date Value  09/10/2022 137  07/04/2022 141     Assessment: 77 year old male with a PMH of PVD  with chronic 3+ BLLE,chronic hypoxemic respiratory failure on 4 L oxygen at home,  and recent diagnosis of atrial fibrillation started on apixaban and amiodarone outpatient, presents with SOB, hypotension, and vascular congestion. Pharmacy consulted to monitor and replace electrolytes.  Meds:  Lasix 80 mg IV BID Spironolactone 25 mg PO daily  Goal of Therapy:  K+ 4.0-5.1; Mg 2-2.4 ISO afib All other electrolytes WNL  Plan:  Potassium chloride 40 mEq PO x 2 Recheck BMP, Mg with AM labs   Delena Bali, PharmD Clinical Pharmacist   09/10/2022 6:30 AM

## 2022-09-10 NOTE — Progress Notes (Signed)
   ADVANCED HEART FAILURE CONSULT NOTE  Referring Physician: No ref. provider found  Primary Care: Pcp, No  HPI: Ernest [name] is a 77 y.o. male with persistent atrial fibrillation status post cardioversion in February 2024, hypertension, morbid obesity, chronic respiratory failure on home O2 that currently lives in a skilled nursing facility presenting as a direct admission from cardiology clinic due to hypotension, decompensated heart failure and atrial fibrillation with RVR. Over the past 2 months Mr. Ernest [name] has had 2 prolonged admissions at [facility].  He was admitted from February 2 to February 9 for orthostatic hypotension and syncope.  During that admission he was started on midodrine 15 mg 3 times daily, Florinef 20mg  and had an abdominal binder placed.  He was sent to rehab but unfortunately had another fall there.  He was once again admitted to [facility] where he had an extensive evaluation for hypotension including workup for VIPoma.  Workup was essentially unremarkable.  Interval hx / Subjective -Right heart cath today with low filling pressures. -Received IV Lasix this morning.  Will hold off on further diuretics.  PHYSICAL EXAM: Vitals:   09/10/22 1200 09/10/22 1300  BP: (!) 83/60 (!) 78/59  Pulse: 95 84  Resp: (!) 26 17  Temp:    SpO2: 100% 100%   GENERAL: elderly male, NAD.  HEENT: Negative for arcus senilis or xanthelasma. There is no scleral icterus.  The mucous membranes are pink and moist.   NECK: Supple, No masses. Normal carotid upstrokes without bruits. No masses or thyromegaly.    CHEST: There are no chest wall deformities. There is no chest wall tenderness. Respirations are unlabored.  Lungs-decreased lung sounds bilaterally CARDIAC:  JVP: 10-11         Normal S1, S2  Normal rate with irregular rhythm. No murmurs, rubs or gallops.  Pulses are 2+ and symmetrical in upper and lower extremities.  1-2+ edema to the thighs.  ABDOMEN: Soft, non-tender, non-distended. There  are no masses or hepatomegaly. There are normal bowel sounds.  EXTREMITIES: Warm and well perfused with no cyanosis, clubbing.  LYMPHATIC: No axillary or supraclavicular lymphadenopathy.  NEUROLOGIC: Patient is oriented x3 with no focal or lateralizing neurologic deficits.  PSYCH: Patients affect is appropriate, there is no evidence of anxiety or depression.  SKIN: Warm and dry; no lesions or wounds.   DATA REVIEW  ECG: Atrial fibrillation as per my personal interpretation  ECHO: Preserved LV function on 07/08/2022.  RV not well-visualized as per my personal interpretation  ASSESSMENT & PLAN:  Severe volume overload/persistent hypotension -Echocardiogram from 07/08/2022 demonstrates preserved LV function.  The right ventricle is not well-visualized but appears to have relatively preserved systolic function. -Ernest [name] is now net -4.8 L since admission.  Right heart cath today with preserved cardiac index and otherwise very low filling pressures.  At this time his hypotension is not explained by a cardiac etiology and most likely consistent with dysautonomia.  Will hold off on further diuretics today and start torsemide tomorrow.  2. Persistent atrial fibrillation - Rate controlled - Amiodarone 400mg  BID for now.  - Continue apixaban   3. Hypotension  - Likely due to underlying autonomic dysfunction. I also suspect cuff pressures are not accurate (measuring 123XX123 systolics).  - Continue midodrine 15mg  TID - [name] use vasopressin if he becomes persistently hypotensive.  - Bps stable in the upper 123XX123, low 0000000 systolic.   Ernest [name] Advanced Heart Failure Mechanical Circulatory Support

## 2022-09-10 NOTE — Progress Notes (Signed)
  Progress Note   Patient: Ernest [name] [id number] DOB: [birth year] DOA: 08/25/2022     2 DOS: the patient was seen and examined on 09/21/2022   Brief hospital course:  Assessment and Plan: Acute on chronic respiratory failure secondary to fluid overload in the setting of acute on chronic diastolic dysfunction - s/p R heart cath on 08/27/2022 - Aldactone 25 mg PO daily  - Cardio/heart failure team is following    Acute kidney injury secondary to above - Resolved    Acute on chronic persistent atrial fibrillation - Amiodarone 400 mg PO bid    Hypotension likely orthostatic hypotension versus cardiogenic shock Orthostatic hypotension - Midodrine 15 mg PO tid    Vitamin B12 deficiency - B12 1000 mcg PO daily   Hypokalemia - Potassium 40 meq PO x 2    Morbid obesity - Complicating care    Obesity hypoventilation syndrome - O2 support - Complicating care    GERD - Protonix 40 mg PO MWF   Normocytic anemia - Monitor    Leukocytosis likely reactive - Monitor    DVT prophylaxis: SCD's ordered      Subjective: Pt seen and examined at the bedside. He is s/p R heart cath. Spoke with his wife at the bedside today. They are interested in long term placement (not rehab) as the pt's wife can not longer care for the pt at home.  Physical Exam: Vitals:   08/29/2022 1000 08/29/2022 1100 08/29/2022 1200 09/20/2022 1300  BP: (!) 83/67 (!) 84/66 (!) 83/60 (!) 78/59  Pulse: 78 88 95 84  Resp: 14 15 (!) 26 17  Temp: 97.8 F (36.6 C)     TempSrc:      SpO2: 98% 100% 100% 100%  Weight:      Height:       Physical Exam Constitutional:      Appearance: He is well-developed.  HENT:     Head: Normocephalic.     Mouth/Throat:     Mouth: Mucous membranes are moist.  Cardiovascular:     Rate and Rhythm: Normal rate and regular rhythm.  Pulmonary:     Effort: Pulmonary effort is normal.  Abdominal:     Palpations: Abdomen is soft.  Skin:    General: Skin is warm.   Neurological:     Mental Status: He is alert and oriented to person, place, and time.  Psychiatric:        Mood and Affect: Mood normal.     Data Reviewed:   Disposition: Status is: Inpatient  Planned Discharge Destination: Barriers to discharge: Hypotension and long term placement     Time spent: 35 minutes  Author: Lucienne Minks , MD 09/11/2022 3:14 PM  For on call review [email or web address].

## 2022-09-10 NOTE — Interval H&P Note (Signed)
History and Physical Interval Note:  09/10/2022 8:01 AM  Ernest [name]  has presented today for surgery, with the diagnosis of heart failure with reduced ejection fraction.  The various methods of treatment have been discussed with the patient and family. After consideration of risks, benefits and other options for treatment, the patient has consented to  Procedure(s): RIGHT HEART CATH (N/A) as a surgical intervention.  The patient's history has been reviewed, patient examined, no change in status, stable for surgery.  I have reviewed the patient's chart and labs.  Questions were answered to the patient's satisfaction.     Bridget Westbrooks

## 2022-09-11 ENCOUNTER — Encounter: Payer: Self-pay | Admitting: Cardiology

## 2022-09-11 DIAGNOSIS — R570 Cardiogenic shock: Secondary | ICD-10-CM | POA: Diagnosis not present

## 2022-09-11 LAB — COMPREHENSIVE METABOLIC PANEL
ALT: 22 U/L (ref 0–44)
AST: 20 U/L (ref 15–41)
Albumin: 1.6 g/dL — ABNORMAL LOW (ref 3.5–5.0)
Alkaline Phosphatase: 110 U/L (ref 38–126)
Anion gap: 7 (ref 5–15)
BUN: 21 mg/dL (ref 8–23)
CO2: 30 mmol/L (ref 22–32)
Calcium: 7.1 mg/dL — ABNORMAL LOW (ref 8.9–10.3)
Chloride: 100 mmol/L (ref 98–111)
Creatinine, Ser: 1.19 mg/dL (ref 0.61–1.24)
GFR, Estimated: >60 mL/min (ref >60)
Glucose, Bld: 89 mg/dL (ref 70–99)
Potassium: 3.4 mmol/L — ABNORMAL LOW (ref 3.5–5.1)
Sodium: 137 mmol/L (ref 135–145)
Total Bilirubin: 0.9 mg/dL (ref 0.3–1.2)
Total Protein: 4.3 g/dL — ABNORMAL LOW (ref 6.5–8.1)

## 2022-09-11 LAB — PHOSPHORUS: Phosphorus: 3.4 mg/dL (ref 2.5–4.6)

## 2022-09-11 LAB — CBC
HCT: 32.5 % — ABNORMAL LOW (ref 39.0–52.0)
Hemoglobin: 10.7 g/dL — ABNORMAL LOW (ref 13.0–17.0)
MCH: 27.6 pg (ref 26.0–34.0)
MCHC: 32.9 g/dL (ref 30.0–36.0)
MCV: 84 fL (ref 80.0–100.0)
Platelets: 204 10*3/uL (ref 150–400)
RBC: 3.87 MIL/uL — ABNORMAL LOW (ref 4.22–5.81)
RDW: 17.2 % — ABNORMAL HIGH (ref 11.5–15.5)
WBC: 7.2 10*3/uL (ref 4.0–10.5)
nRBC: 0 % (ref 0.0–0.2)

## 2022-09-11 LAB — MAGNESIUM: Magnesium: 2 mg/dL (ref 1.7–2.4)

## 2022-09-11 MED ORDER — APIXABAN 5 MG PO TABS
5.0000 mg | ORAL_TABLET | Freq: Two times a day (BID) | ORAL | Status: DC
  Administered 2022-09-11 – 2022-09-23 (×26): 5 mg via ORAL
  Filled 2022-09-11 (×27): qty 1

## 2022-09-11 MED ORDER — ALUM & MAG HYDROXIDE-SIMETH 200-200-20 MG/5ML PO SUSP
30.0000 mL | Freq: Four times a day (QID) | ORAL | Status: DC | PRN
  Administered 2022-09-12 – 2022-09-23 (×8): 30 mL via ORAL
  Filled 2022-09-11 (×8): qty 30

## 2022-09-11 MED ORDER — POTASSIUM CHLORIDE CRYS ER 20 MEQ PO TBCR
40.0000 meq | EXTENDED_RELEASE_TABLET | Freq: Once | ORAL | Status: AC
  Administered 2022-09-11: 40 meq via ORAL
  Filled 2022-09-11: qty 2

## 2022-09-11 MED ORDER — ALUM & MAG HYDROXIDE-SIMETH 200-200-20 MG/5ML PO SUSP
30.0000 mL | Freq: Once | ORAL | Status: AC
  Administered 2022-09-11: 30 mL via ORAL
  Filled 2022-09-11: qty 30

## 2022-09-11 NOTE — NC FL2 (Signed)
[place] LEVEL OF CARE FORM     IDENTIFICATION  Patient Name: Ernest [name] Birthdate: [birth year] Sex: male Admission Date (Current Location): 09/08/2022  [place] and [place] Number:  Engineering geologist and Address:  [facility], [address] [place],  [address]      Provider Number: [id number]  Attending Physician Name and Address:  Lucienne Minks, MD  Relative Name and Phone Number:  Toye Painter [phone]    Current Level of Care: Hospital Recommended Level of Care: [place] Prior Approval Number:    Date Approved/Denied:   PASRR Number: [id number]  Discharge Plan: SNF    Current Diagnoses: Patient Active Problem List   Diagnosis Date Noted   Cardiogenic shock ([place]) 09/08/2022   Fall 08/17/2022   Orthostatic hypotension 08/17/2022   Leukocytosis 08/17/2022   Chronic diastolic CHF (congestive heart failure) ([place]) 08/17/2022   Near syncope 07/26/2022   Diarrhea 07/26/2022   Chronic anticoagulation 07/26/2022   Chronic venous stasis 07/26/2022   Hypokalemia 07/26/2022   Hypotension 07/26/2022   Hypomagnesemia 07/26/2022   Persistent atrial fibrillation ([place]) 07/14/2022   Atrial fibrillation with rapid ventricular response ([place]) 06/27/2022   Hypercoagulable state due to persistent atrial fibrillation ([place]) 06/27/2022   Chronic right-sided heart failure ([place]) 06/27/2022   Obesity hypoventilation syndrome ([place]) 06/27/2022   Essential hypertension 06/27/2022   Chronic respiratory failure with hypoxia ([place]) 05/22/2022   OSA (obstructive sleep apnea) 05/22/2022   History of colonic polyps    Polyp of transverse colon    Adenomatous polyp of cecum    Partial small bowel obstruction ([facility]) 04/01/2021   Abdominal bloating 11/30/2020   Abdominal pain, epigastric    Symptomatic anemia    GERD (gastroesophageal reflux disease)    Swollen lymph nodes 07/25/2020   SVT (supraventricular  tachycardia) 06/25/2020   Obesity, Class III, BMI 40-49.9 (morbid obesity) ([place]) 06/25/2020   History of umbilical hernia repair [id number]    Orientation RESPIRATION BLADDER Height & Weight     Self, Time, Place, Situation    Continent Weight: 290 lb 2 oz (131.6 kg) Height:  6\' 1"  (185.4 cm)  BEHAVIORAL SYMPTOMS/MOOD NEUROLOGICAL BOWEL NUTRITION STATUS      Incontinent Diet (see discharge summary)  AMBULATORY STATUS COMMUNICATION OF NEEDS Skin   Extensive Assist Verbally Other (Comment) (pressure injury sacrum)                       Personal Care Assistance Level of Assistance  Bathing, Feeding, Dressing, Total care Bathing Assistance: Maximum assistance Feeding assistance: Limited assistance Dressing Assistance: Maximum assistance Total Care Assistance: Maximum assistance   Functional Limitations Info  Sight, Hearing, Speech Sight Info: Adequate Hearing Info: Impaired Speech Info: Adequate    SPECIAL CARE FACTORS FREQUENCY  PT (By licensed PT), OT (By licensed OT)     PT Frequency: 5x weekly OT Frequency: 5x weekly            Contractures Contractures Info: Not present    Additional Factors Info  Code Status, Allergies Code Status Info: DNR Allergies Info: NKA           Current Medications (09/11/2022):  This is the current hospital active medication list Current Facility-Administered Medications  Medication Dose Route Frequency Provider Last Rate Last Admin   alum & mag hydroxide-simeth (MAALOX/MYLANTA) 200-200-20 MG/5ML suspension 30 mL  30 mL Oral Once Lucienne Minks, MD       alum & mag hydroxide-simeth (MAALOX/MYLANTA)  200-200-20 MG/5ML suspension 30 mL  30 mL Oral Q6H PRN Lucienne Minks, MD       amiodarone (PACERONE) tablet 400 mg  400 mg Oral BID Marguerita Merles T, MD   400 mg at 09/10/22 2101   Chlorhexidine Gluconate Cloth 2 % PADS 6 each  6 each Topical Daily Verline Lema, MD   6 each at 09/09/22 0825   cyanocobalamin (VITAMIN B12) tablet 1,000  mcg  1,000 mcg Oral Daily Marguerita Merles T, MD   1,000 mcg at 09/10/22 1140   dicyclomine (BENTYL) capsule 10 mg  10 mg Oral TID PRN Lang Snow, NP   10 mg at 09/10/22 1153   diphenhydrAMINE (BENADRYL) capsule 25 mg  25 mg Oral Once Foust, Katy L, NP       midodrine (PROAMATINE) tablet 15 mg  15 mg Oral TID WC Sabharwal, Aditya, DO   15 mg at 09/10/22 1736   nystatin (MYCOSTATIN/NYSTOP) topical powder   Topical Daily Verline Lema, MD   Given at 09/09/22 1529   Oral care mouth rinse  15 mL Mouth Rinse PRN Verline Lema, MD       pantoprazole (PROTONIX) EC tablet 40 mg  40 mg Oral Q M,W,F Djan, Prince T, MD   40 mg at 09/10/22 1140   potassium chloride SA (KLOR-CON M) CR tablet 40 mEq  40 mEq Oral Once Lucienne Minks, MD       potassium chloride SA (KLOR-CON M) CR tablet 40 mEq  40 mEq Oral Once Lucienne Minks, MD         Discharge Medications: Please see discharge summary for a list of discharge medications.  Relevant Imaging Results:  Relevant Lab Results:   Additional Information SSN: [id number]  Tiburcio Bash, LCSW

## 2022-09-11 NOTE — Progress Notes (Signed)
   ADVANCED HEART FAILURE CONSULT NOTE  Referring Physician: No ref. provider found  Primary Care: Pcp, No  HPI: Ernest [name] is a 77 y.o. male with persistent atrial fibrillation status post cardioversion in February 2024, hypertension, morbid obesity, chronic respiratory failure on home O2 that currently lives in a skilled nursing facility presenting as a direct admission from cardiology clinic due to hypotension, decompensated heart failure and atrial fibrillation with RVR. Over the past 2 months Mr. Ernest [name] has had 2 prolonged admissions at [facility].  He was admitted from February 2 to February 9 for orthostatic hypotension and syncope.  During that admission he was started on midodrine 15 mg 3 times daily, Florinef 20mg  and had an abdominal binder placed.  He was sent to rehab but unfortunately had another fall there.  He was once again admitted to [facility] where he had an extensive evaluation for hypotension including workup for VIPoma.  Workup was essentially unremarkable.  Interval hx / Subjective -RHC yesterday with low filling pressures.   PHYSICAL EXAM: Vitals:   09/11/22 0326 09/11/22 0755  BP: (!) 80/58 (!) 83/62  Pulse: 78 80  Resp: 18 16  Temp: 98.1 F (36.7 C) 97.6 F (36.4 C)  SpO2: 100% 100%   GENERAL: elderly male, NAD.  HEENT: Negative for arcus senilis or xanthelasma. There is no scleral icterus.  The mucous membranes are pink and moist.   NECK: Supple, No masses. Normal carotid upstrokes without bruits. No masses or thyromegaly.    CHEST: There are no chest wall deformities. There is no chest wall tenderness. Respirations are unlabored.  Lungs-decreased lung sounds bilaterally CARDIAC:  JVP: <7         Normal S1, S2  Normal rate with irregular rhythm. No murmurs, rubs or gallops.  Pulses are 2+ and symmetrical in upper and lower extremities.  1-2+ edema to the thighs.  ABDOMEN: Soft, non-tender, non-distended. There are no masses or hepatomegaly. There are normal bowel  sounds.  EXTREMITIES: Warm and well perfused with no cyanosis, clubbing.  LYMPHATIC: No axillary or supraclavicular lymphadenopathy.  NEUROLOGIC: Patient is oriented x3 with no focal or lateralizing neurologic deficits.  PSYCH: Patients affect is appropriate, there is no evidence of anxiety or depression.  SKIN: Warm and dry; no lesions or wounds.   DATA REVIEW  ECG: Atrial fibrillation as per my personal interpretation  ECHO: Preserved LV function on 07/08/2022.  RV not well-visualized as per my personal interpretation  ASSESSMENT & PLAN:  Severe volume overload/persistent hypotension -Echocardiogram from 07/08/2022 demonstrates preserved LV function.  The right ventricle is not well-visualized but appears to have relatively preserved systolic function. -RHC with low normal cardiac index and low filling pressures. [name] is now well diuresed and hypovolemic. -Would hold further diuretics for the time being; [name] discharge on torsemide 40mg  PRN for weight gain or lower extremity edema.   2. Persistent atrial fibrillation - Rate controlled - Amiodarone 400mg  BID for now.  - Continue apixaban   3. Hypotension  - Likely due to underlying autonomic dysfunction. I also suspect cuff pressures are not accurate (measuring 123XX123 systolics).  - Continue midodrine 15mg  TID - Unfortunately, Mr. Holtzclaw continues to remain significantly hypotension without a clear etiology. Work-up thus far has remained negative. [name] consider referral to tertiary care center or dysautonomia clinic etc for consideration for other therapies (?droxidopa, etc). His hypotension is highly unlikely to be secondary to cardiac etiologies.   Neno Hohensee Advanced Heart Failure Mechanical Circulatory Support

## 2022-09-11 NOTE — Care Management Important Message (Signed)
Important Message  Patient Details  Name: Ernest [name] MRN: [id number] Date of Birth: 04-Sep-1945   Medicare Important Message Given:  Yes     Dannette Barbara 09/11/2022, 12:27 PM

## 2022-09-11 NOTE — TOC Initial Note (Signed)
Transition of Care [facility]) - Initial/Assessment Note    Patient Details  Name: Ernest [name] MRN: [id number] Date of Birth: Jun 28, 1945  Transition of Care [facility]) CM/SW Contact:    Tiburcio Bash, LCSW Phone Number: 09/11/2022, 9:07 AM  Clinical Narrative:                  CSW spoke with patient's spouse [name] regarding discharge planning, they are interested in long term care placement. [name] reports they make too much for Medicaid, not enough to private pay, and patient has Clinical research associate retirement. She reports in addition to [facility] he also has tricare.   [name] is in agreement with referral to be made to [employer] to follow up with placement options given current finances.   CSW has made referral to [facility], referrals have also been sent.   Expected Discharge Plan: Skilled Nursing Facility Barriers to Discharge: Continued Medical Work up   Patient Goals and CMS Choice Patient states their goals for this hospitalization and ongoing recovery are:: long term care          Expected Discharge Plan and Services       Living arrangements for the past 2 months: Single Family Home                                      Prior Living Arrangements/Services Living arrangements for the past 2 months: Single Family Home Lives with:: Spouse                   Activities of Daily Living Home Assistive Devices/Equipment: Hearing aid, Oxygen, Wheelchair, Reacher ADL Screening (condition at time of admission) Patient's cognitive ability adequate to safely complete daily activities?: Yes Is the patient deaf or have difficulty hearing?: Yes Does the patient have difficulty seeing, even when wearing glasses/contacts?: No Does the patient have difficulty concentrating, remembering, or making decisions?: No Patient able to express need for assistance with ADLs?: Yes Does the patient have difficulty dressing or bathing?: Yes Independently performs ADLs?:  No Communication: Independent Dressing (OT): Needs assistance Is this a change from baseline?: Pre-admission baseline Grooming: Needs assistance Is this a change from baseline?: Pre-admission baseline Feeding: Independent Bathing: Dependent Is this a change from baseline?: Pre-admission baseline Toileting: Dependent Is this a change from baseline?: Pre-admission baseline In/Out Bed: Needs assistance Is this a change from baseline?: Pre-admission baseline Walks in Home: Dependent Is this a change from baseline?: Pre-admission baseline Does the patient have difficulty walking or climbing stairs?: Yes Weakness of Legs: Both Weakness of Arms/Hands: None  Permission Sought/Granted                  Emotional Assessment              Admission diagnosis:  Cardiogenic shock ([place]) [R57.0] Hypokalemia [E87.6] Hypomagnesemia [E83.42] Atrial fibrillation, unspecified type ([place]) [I48.91] Acute on chronic congestive heart failure, unspecified heart failure type [facility]) [I50.9] Patient Active Problem List   Diagnosis Date Noted   Cardiogenic shock ([place]) 09/08/2022   Fall 08/17/2022   Orthostatic hypotension 08/17/2022   Leukocytosis 08/17/2022   Chronic diastolic CHF (congestive heart failure) ([place]) 08/17/2022   Near syncope 07/26/2022   Diarrhea 07/26/2022   Chronic anticoagulation 07/26/2022   Chronic venous stasis 07/26/2022   Hypokalemia 07/26/2022   Hypotension 07/26/2022   Hypomagnesemia 07/26/2022   Persistent atrial fibrillation ([place]) 07/14/2022  Atrial fibrillation with rapid ventricular response ([place]) 06/27/2022   Hypercoagulable state due to persistent atrial fibrillation ([place]) 06/27/2022   Chronic right-sided heart failure ([place]) 06/27/2022   Obesity hypoventilation syndrome ([place]) 06/27/2022   Essential hypertension 06/27/2022   Chronic respiratory failure with hypoxia ([place]) 05/22/2022   OSA (obstructive sleep apnea) 05/22/2022   History of colonic polyps     Polyp of transverse colon    Adenomatous polyp of cecum    Partial small bowel obstruction ([place] ) 04/01/2021   Abdominal bloating 11/30/2020   Abdominal pain, epigastric    Symptomatic anemia    GERD (gastroesophageal reflux disease)    Swollen lymph nodes 07/25/2020   SVT (supraventricular tachycardia) 06/25/2020   Obesity, Class III, BMI 40-49.9 (morbid obesity) ([place]) 06/25/2020   History of umbilical hernia repair [id number]   PCP:  Pcp, No Pharmacy:   [id number] - [place], [place] - [address] [place] [address] Phone: [phone] Fax: [phone]     Social Determinants of Health (SDOH) Social History: SDOH Screenings   Food Insecurity: No Food Insecurity (09/08/2022)  Housing: Low Risk  (09/08/2022)  Transportation Needs: No Transportation Needs (09/08/2022)  Utilities: Not At Risk (09/08/2022)  Alcohol Screen: Low Risk  (06/21/2021)  Depression (PHQ2-9): Low Risk  (08/25/2022)  Financial Resource Strain: Low Risk  (06/21/2021)  Physical Activity: Inactive (06/21/2021)  Social Connections: Unknown (06/21/2021)  Stress: No Stress Concern Present (06/21/2021)  Tobacco Use: Low Risk  (09/11/2022)   SDOH Interventions:     Readmission Risk Interventions     No data to display

## 2022-09-11 NOTE — TOC Progression Note (Signed)
Transition of Care [facility]) - Progression Note    Patient Details  Name: ADHAV CROXFORD MRN: [id number] Date of Birth: 08-28-1945  Transition of Care [facility]) CM/SW Contact  Laurena Slimmer, RN Phone Number: 09/11/2022, 4:27 PM  Clinical Narrative:    Attempt to contact Farrel Conners at [facility] @ [phone]. No answer. Left a message regarding discharge plan.   Expected Discharge Plan: [place] Barriers to Discharge: Continued Medical Work up  Expected Discharge Plan and Services       Living arrangements for the past 2 months: Single Family Home                                       Social Determinants of Health (SDOH) Interventions SDOH Screenings   Food Insecurity: No Food Insecurity (09/08/2022)  Housing: Low Risk  (09/08/2022)  Transportation Needs: No Transportation Needs (09/08/2022)  Utilities: Not At Risk (09/08/2022)  Alcohol Screen: Low Risk  (06/21/2021)  Depression (PHQ2-9): Low Risk  (08/25/2022)  Financial Resource Strain: Low Risk  (06/21/2021)  Physical Activity: Inactive (06/21/2021)  Social Connections: Unknown (06/21/2021)  Stress: No Stress Concern Present (06/21/2021)  Tobacco Use: Low Risk  (09/11/2022)    Readmission Risk Interventions     No data to display

## 2022-09-11 NOTE — Progress Notes (Signed)
PT Cancellation Note  Patient Details Name: Ernest [name] MRN: [id number] DOB: 1945/09/13   Cancelled Treatment:     Consult received and chart reviewed.  Patient resting in bed upon arrival to room; semi-supine in bed, SCDs donned, LEs elevated.  Order for thigh-high TEDs and abdominal binder secured from physician.  TEDS issued; wife to bring personal binder this PM (patient has custom-ordered binder due to abdominal girth).  Will hold mobility assessment until all components are available, and session coordinated with midodrine administration, to optimize ability to tolerate transition to upright.  Per patient, came to hospital from [facility] for [place]; remained globally limited by orthostasis (dysautonomia), becoming symptomatic even with transition to fully upright sitting. Had been able to stand minimal times, but tolerates just a few seconds before becoming symptomatic and requiring return to sitting/supine.  Question remaining STR benefits; message out to [facility] to discuss.  Should patient need to transition to home or LTC, anticipate need for high-back reclining manual WC (22W x 18D) with elevating leg rests, brake extensions, anti-tippers and pressure-relief cushion; hospital bed; hoyer lift.   Will re-attempt evaluation this PM/tomorrow AM once all support devices are available and patient most likely to safely tolerate full assessment.   Analisse Randle H. Owens Shark, PT, DPT, NCS 09/11/22, 12:21 PM [phone]

## 2022-09-11 NOTE — Consult Note (Signed)
[place] for Electrolyte Monitoring and Replacement   Recent Labs: Potassium (mmol/L)  Date Value  09/11/2022 3.4 (L)   Magnesium (mg/dL)  Date Value  09/11/2022 2.0   Calcium (mg/dL)  Date Value  09/11/2022 7.1 (L)   Albumin (g/dL)  Date Value  09/11/2022 1.6 (L)   Phosphorus (mg/dL)  Date Value  09/11/2022 3.4   Sodium (mmol/L)  Date Value  09/11/2022 137  07/04/2022 141   Assessment: 77 year old male with a PMH of PVD  with chronic 3+ BLLE,chronic hypoxemic respiratory failure on 4 L oxygen at home,  and recent diagnosis of atrial fibrillation started on apixaban and amiodarone outpatient, presents with SOB, hypotension, and vascular congestion. Pharmacy consulted to monitor and replace electrolytes.  Meds:  Lasix 80 mg IV BID (discontinued 3/20) Spironolactone 25 mg PO daily  Goal of Therapy:  K+ > 4 Mg > 2 ISO Afib on amiodarone All other electrolytes within normal limits  Plan:  --K 3.4, Kcl 40 mEq PO x 2 doses ordered for today per primary team --Patient care transitioned from PCCM to [facility]. Will discontinue electrolyte consult at this time. Defer further ordering of labs and electrolyte replacement to primary team --Pharmacy will continue to follow along peripherally  Benita Gutter 09/11/2022 7:35 AM

## 2022-09-11 NOTE — Progress Notes (Addendum)
  Progress Note   Patient: Ernest [name] [id number] DOB: [birth year] DOA: 09/03/2022     3 DOS: the patient was seen and examined on 09/11/2022   Brief hospital course:  Assessment and Plan: Acute on chronic respiratory failure secondary to fluid overload in the setting of acute on chronic diastolic dysfunction - s/p R heart cath on 09/21/2022 - Cardio/heart failure team assistance appreciated    Acute kidney injury secondary to above - Resolved    Acute on chronic persistent atrial fibrillation - Amiodarone 400 mg PO bid  - Eliquis 5 mg PO bid     Hypotension likely orthostatic hypotension versus cardiogenic shock Orthostatic hypotension - Midodrine 15 mg PO tid    Vitamin B12 deficiency - B12 1000 mcg PO daily   Hypokalemia - Potassium 40 meq PO x 2    Morbid obesity - Complicating care    Obesity hypoventilation syndrome - O2 support - Complicating care    GERD - Protonix 40 mg PO MWF   Normocytic anemia - Monitor    Leukocytosis likely reactive - Monitor    DVT prophylaxis: Eliquis 5 mg PO bid        Subjective: Pt seen and examined at the bedside. Case management is working on placement for this pt. Cardio/heart failure has advised that the pt's low bp's are likely not cardiac origin but rather autonomic dysfunction. Disposition per case management.  Physical Exam: Vitals:   08/30/2022 2100 08/28/2022 2348 09/11/22 0326 09/11/22 0755  BP: (!) 72/58 (!) 71/59 (!) 80/58 (!) 83/62  Pulse: 79 81 78 80  Resp: 20 18 18 16   Temp:  97.8 F (36.6 C) 98.1 F (36.7 C) 97.6 F (36.4 C)  TempSrc: Oral  Temporal Oral  SpO2: 100% 100% 100% 100%  Weight:   131.6 kg   Height:       Constitutional:      Appearance: He is well-developed.  HENT:     Head: Normocephalic.     Mouth/Throat:     Mouth: Mucous membranes are moist.  Cardiovascular:     Rate and Rhythm: Normal rate and regular rhythm.  Pulmonary:     Effort: Pulmonary effort is normal.   Abdominal:     Palpations: Abdomen is soft.  Skin:    General: Skin is warm.  Neurological:     Mental Status: He is alert and oriented to person, place, and time.  Psychiatric:        Mood and Affect: Mood normal.   Data Reviewed:   Disposition: Status is: Inpatient  Planned Discharge Destination: Skilled nursing facility    Time spent: 35 minutes  Author: Lucienne Minks , MD 09/11/2022 9:34 AM  For on call review [email or web address].

## 2022-09-12 DIAGNOSIS — R570 Cardiogenic shock: Secondary | ICD-10-CM | POA: Diagnosis not present

## 2022-09-12 LAB — COMPREHENSIVE METABOLIC PANEL
ALT: 21 U/L (ref 0–44)
AST: 22 U/L (ref 15–41)
Albumin: 1.6 g/dL — ABNORMAL LOW (ref 3.5–5.0)
Alkaline Phosphatase: 109 U/L (ref 38–126)
Anion gap: 10 (ref 5–15)
BUN: 21 mg/dL (ref 8–23)
CO2: 30 mmol/L (ref 22–32)
Calcium: 7.6 mg/dL — ABNORMAL LOW (ref 8.9–10.3)
Chloride: 100 mmol/L (ref 98–111)
Creatinine, Ser: 1.14 mg/dL (ref 0.61–1.24)
GFR, Estimated: >60 mL/min (ref >60)
Glucose, Bld: 83 mg/dL (ref 70–99)
Potassium: 4 mmol/L (ref 3.5–5.1)
Sodium: 140 mmol/L (ref 135–145)
Total Bilirubin: 0.5 mg/dL (ref 0.3–1.2)
Total Protein: 4.3 g/dL — ABNORMAL LOW (ref 6.5–8.1)

## 2022-09-12 LAB — CBC
HCT: 32.9 % — ABNORMAL LOW (ref 39.0–52.0)
Hemoglobin: 10.7 g/dL — ABNORMAL LOW (ref 13.0–17.0)
MCH: 27.6 pg (ref 26.0–34.0)
MCHC: 32.5 g/dL (ref 30.0–36.0)
MCV: 85 fL (ref 80.0–100.0)
Platelets: 200 10*3/uL (ref 150–400)
RBC: 3.87 MIL/uL — ABNORMAL LOW (ref 4.22–5.81)
RDW: 17.4 % — ABNORMAL HIGH (ref 11.5–15.5)
WBC: 8.6 10*3/uL (ref 4.0–10.5)
nRBC: 0 % (ref 0.0–0.2)

## 2022-09-12 LAB — PHOSPHORUS: Phosphorus: 3.4 mg/dL (ref 2.5–4.6)

## 2022-09-12 LAB — MAGNESIUM: Magnesium: 2.1 mg/dL (ref 1.7–2.4)

## 2022-09-12 NOTE — Progress Notes (Signed)
ADVANCED HEART FAILURE CONSULT NOTE  Referring Physician: No ref. provider found  Primary Care: Pcp, No  HPI: DENARIO BAIK II is a 77 y.o. male with persistent atrial fibrillation status post cardioversion in February 2024, hypertension, morbid obesity, chronic respiratory failure on home O2 that currently lives in a skilled nursing facility presenting as a direct admission from cardiology clinic due to hypotension, decompensated heart failure and atrial fibrillation with RVR. Over the past 2 months [name] has had 2 prolonged admissions at [facility].  He was admitted from February 2 to February 9 for orthostatic hypotension and syncope.  During that admission he was started on midodrine 15 mg 3 times daily, Florinef 20mg  and had an abdominal binder placed.  He was sent to rehab but unfortunately had another fall there.  He was once again admitted to [facility] where he had an extensive evaluation for hypotension including workup for VIPoma.  Workup was essentially unremarkable.  Interval hx / Subjective -Remains hypovolemic on exam -Partaking in physical therapy now.   PHYSICAL EXAM: Vitals:   09/12/22 0333 09/12/22 0744  BP: (!) 85/65 (!) 85/62  Pulse: 82 80  Resp: 18 20  Temp: 97.7 F (36.5 C) 97.7 F (36.5 C)  SpO2: 98% 100%   GENERAL: elderly male, NAD.sitting in  HEENT: Negative for arcus senilis or xanthelasma. There is no scleral icterus.  The mucous membranes are pink and moist.   NECK: Supple, No masses. Normal carotid upstrokes without bruits. No masses or thyromegaly.    CHEST: There are no chest wall deformities. There is no chest wall tenderness. Respirations are unlabored.  Lungs-decreased lung sounds bilaterally CARDIAC:  JVP: <<7         Normal S1, S2  Normal rate with irregular rhythm. No murmurs, rubs or gallops.  Pulses are 2+ and symmetrical in upper and lower extremities.  No edema.  ABDOMEN: Soft, non-tender, non-distended. There are no masses or hepatomegaly. There  are normal bowel sounds.  EXTREMITIES: Warm and well perfused with no cyanosis, clubbing.  LYMPHATIC: No axillary or supraclavicular lymphadenopathy.  NEUROLOGIC: Patient is oriented x3 with no focal or lateralizing neurologic deficits.  PSYCH: Patients affect is appropriate, there is no evidence of anxiety or depression.  SKIN: Warm and dry; no lesions or wounds.   DATA REVIEW  ECG: Atrial fibrillation as per my personal interpretation  ECHO: Preserved LV function on 07/08/2022.  RV not well-visualized as per my personal interpretation  ASSESSMENT & PLAN:  Severe volume overload/persistent hypotension -Echocardiogram from 07/08/2022 demonstrates preserved LV function.  The right ventricle is not well-visualized but appears to have relatively preserved systolic function. -RHC with low normal cardiac index and low filling pressures. M -Remains hypovolemic on exam.  -Would hold further diuretics for the time being; can discharge on torsemide 40mg  PRN for weight gain or lower extremity edema.  -HF will sign off.   2. Persistent atrial fibrillation - Rate controlled - Amiodarone 400mg  BID for now.  - Continue apixaban   3. Hypotension  - Likely due to underlying autonomic dysfunction. I also suspect cuff pressures are not accurate (measuring 123XX123 systolics).  - Continue midodrine 15mg  TID - Unfortunately, Mr. Tolman continues to remain significantly hypotension without a clear etiology. Work-up thus far has remained negative. Can consider referral to tertiary care center or dysautonomia clinic etc for consideration for other therapies (?droxidopa, etc). His hypotension is highly unlikely to be secondary to cardiac etiologies.   Alon Mazor Advanced Heart Failure Mechanical Circulatory  Support

## 2022-09-12 NOTE — Progress Notes (Signed)
  Progress Note   Patient: Ernest [name] [id number] DOB: 17-Nov-1945 DOA: 08/29/2022     4 DOS: the patient was seen and examined on 09/12/2022   Brief hospital course:  Assessment and Plan: Acute on chronic respiratory failure secondary to fluid overload in the setting of acute on chronic diastolic dysfunction - s/p R heart cath on 09/09/2022 - Cardio/heart failure team has signed off   Acute kidney injury secondary to above - Resolved    Acute on chronic persistent atrial fibrillation - Amiodarone 400 mg PO bid  - Eliquis 5 mg PO bid     Hypotension likely orthostatic hypotension versus cardiogenic shock Orthostatic hypotension - Midodrine 15 mg PO tid    Vitamin B12 deficiency - B12 1000 mcg PO daily   Hypokalemia - Resolved    Morbid obesity - Complicating care    Obesity hypoventilation syndrome - O2 support - Complicating care    GERD - Protonix 40 mg PO MWF   Normocytic anemia - Monitor    Leukocytosis likely reactive - Resolved    DVT prophylaxis: Eliquis 5 mg PO bid      Subjective: Pt seen and examined at the bedside. Heart failure cardio has signed off. At this point the pt is pending long term placement (the pt's wife is not interested in rehab).  Physical Exam: Vitals:   09/11/22 1922 09/11/22 2351 09/12/22 0333 09/12/22 0744  BP: (!) 83/59 (!) 88/65 (!) 85/65 (!) 85/62  Pulse: 74 83 82 80  Resp: 18 19 18 20   Temp: 98.1 F (36.7 C) 98.5 F (36.9 C) 97.7 F (36.5 C) 97.7 F (36.5 C)  TempSrc:      SpO2: 99% 99% 98% 100%  Weight:   129.2 kg   Height:       Constitutional:      Appearance: He is well-developed.  HENT:     Head: Normocephalic.     Mouth/Throat:     Mouth: Mucous membranes are moist.  Cardiovascular:     Rate and Rhythm: Normal rate and regular rhythm.  Pulmonary:     Effort: Pulmonary effort is normal.  Abdominal:     Palpations: Abdomen is soft.  Skin:    General: Skin is warm.  Neurological:     Mental  Status: He is alert and oriented to person, place, and time.  Psychiatric:        Mood and Affect: Mood normal.   Data Reviewed:   Disposition: Status is: Inpatient  Planned Discharge Destination: Skilled nursing facility    Time spent: 35 minutes  Author: Lucienne Minks , MD 09/12/2022 8:56 AM  For on call review [email or web address].

## 2022-09-12 NOTE — TOC Progression Note (Signed)
Transition of Care [facility]) - Progression Note    Patient Details  Name: Ernest [name] MRN: [id number] Date of Birth: 1946/03/31  [facility]) CM/SW Contact  Laurena Slimmer, RN Phone Number: 09/12/2022, 4:31 PM  Clinical Narrative:    Damaris Schooner with Patsy Lager at [facility]. They are requesting updated clinical be sent.    Expected Discharge Plan: [place] Barriers to Discharge: Continued Medical Work up  Expected Discharge Plan and Services       Living arrangements for the past 2 months: Single Family Home                                       Social Determinants of Health (SDOH) Interventions SDOH Screenings   Food Insecurity: No Food Insecurity (09/08/2022)  Housing: Low Risk  (09/08/2022)  Transportation Needs: No Transportation Needs (09/08/2022)  Utilities: Not At Risk (09/08/2022)  Alcohol Screen: Low Risk  (06/21/2021)  Depression (PHQ2-9): Low Risk  (08/25/2022)  Financial Resource Strain: Low Risk  (06/21/2021)  Physical Activity: Inactive (06/21/2021)  Social Connections: Unknown (06/21/2021)  Stress: No Stress Concern Present (06/21/2021)  Tobacco Use: Low Risk  (09/11/2022)    Readmission Risk Interventions     No data to display

## 2022-09-12 NOTE — Evaluation (Signed)
Physical Therapy Evaluation Patient Details Name: Ernest [name] MRN: [id number] DOB: [birth year] Today's Date: 09/12/2022  History of Present Illness  presented to ER secondary to SOB, dizziness; admitted for management of acute/chronic respiratory failure secondary to fluid overload, afib and chronic orthostatic hypotension (likely dysautonomia)  Clinical Impression  Patient resting supine in bed upon arrival to room; alert and oriented, follows commands and eager to participate as tolerated.  Denies pain; does endorse persistent intolerance to upright positioning.  Generally weak and deconditioned throughout all extremities, but no focal weakness appreciated.  Currently requiring mod assist for rolling; limited ability to fully rotate torso and pelvis, heavy use of UEs to assist. Generally hypotensive throughout session (TEDs, abdominal binder donned) with minimal chronotropic response to UE/LE/core therex.  Resting BP 85/65(70), HR 82; after therex BP 81/59(67), HR 77.  Patient endorsing mild onset of lightheadedness with supine therex.  Unable to tolerate attempts at sitting at this time.  Will continue to assess/progress as medically appropriate in subsequent sessions. Would benefit from skilled PT to address above deficits and promote optimal return to PLOF.; recommend transition to STR upon discharge from acute hospitalization.     Question remaining STR benefits; message out to [facility] to discuss. Should patient need to transition to home or LTC, anticipate need for high-back reclining manual WC (22W x 18D) with elevating leg rests, brake extensions, anti-tippers and pressure-relief cushion; hospital bed; hoyer lift.    Recommendations for follow up therapy are one component of a multi-disciplinary discharge planning process, led by the attending physician.  Recommendations may be updated based on patient status, additional functional criteria and insurance authorization.  Follow Up  Recommendations Skilled nursing-short term rehab (<3 hours/day) Can patient physically be transported by private vehicle: No    Assistance Recommended at Discharge Frequent or constant Supervision/Assistance  Patient can return home with the following  Two people to help with walking and/or transfers;Two people to help with bathing/dressing/bathroom    Equipment Recommendations    Recommendations for Other Services       Functional Status Assessment Patient has had a recent decline in their functional status and demonstrates the ability to make significant improvements in function in a reasonable and predictable amount of time.     Precautions / Restrictions Precautions Precautions: Fall Precaution Comments: abdominal binder and ted hose Restrictions Weight Bearing Restrictions: No      Mobility  Bed Mobility Overal bed mobility: Needs Assistance Bed Mobility: Rolling Rolling: Mod assist              Transfers                   General transfer comment: unsafe/unable to tolerate due to symptomatic hypotension    Ambulation/Gait               General Gait Details: unsafe/unable to tolerate due to symptomatic hypotension  Stairs            Wheelchair Mobility    Modified Rankin (Stroke Patients Only)       Balance                                             Pertinent Vitals/Pain Pain Assessment Pain Assessment: No/denies pain    Home Living Family/patient expects to be discharged to:: Skilled nursing facility Living Arrangements: Spouse/significant other Available Help at Discharge:  Family;Available 24 hours/day Type of Home: House Home Access: Stairs to enter   [employer] of Steps: 7 (has a stair lift) but +1 step to get to lift   Home Layout: One level Home Equipment: Rollator (4 wheels);Cane - single point Additional Comments: sleeps in recliner;    Prior Function Prior Level of Function :  History of Falls (last six months)             Mobility Comments: most recently at [facility] for [employer]; working on tolerance to sitting, standing and transfers for OOB to chair       Hand Dominance   Dominant Hand: Left    Extremity/Trunk Assessment   Upper Extremity Assessment Upper Extremity Assessment: Generalized weakness (grossly 4/5 throughout)    Lower Extremity Assessment Lower Extremity Assessment: Generalized weakness (grossly 4-/5 throughout)       Communication   Communication: No difficulties  Cognition Arousal/Alertness: Awake/alert Behavior During Therapy: WFL for tasks assessed/performed Overall Cognitive Status: Within Functional Limits for tasks assessed                                          General Comments      Exercises Other Exercises Other Exercises: Supine UE/LE/core therex, 1x10, active ROM (with manual resistance as appropriate).  Intermittent rest periods after each therex; mild reports of 'lightheadedness' even with supine theres.   Assessment/Plan    PT Assessment Patient needs continued PT services  PT Problem List Decreased strength;Decreased activity tolerance;Decreased balance;Decreased mobility       PT Treatment Interventions DME instruction;Gait training;Functional mobility training;Therapeutic activities;Therapeutic exercise;Balance training;Patient/family education    PT Goals (Current goals can be found in the Care Plan section)  Acute Rehab PT Goals Patient Stated Goal: To return home PT Goal Formulation: With patient Time For Goal Achievement: 10/06/2022 Potential to Achieve Goals: Fair    Frequency Min 2X/week     Co-evaluation               AM-PAC PT "6 Clicks" Mobility  Outcome Measure Help needed turning from your back to your side while in a flat bed without using bedrails?: A Lot Help needed moving from lying on your back to sitting on the side of a flat bed without using  bedrails?: Total Help needed moving to and from a bed to a chair (including a wheelchair)?: Total Help needed standing up from a chair using your arms (e.g., wheelchair or bedside chair)?: Total Help needed to walk in hospital room?: Total Help needed climbing 3-5 steps with a railing? : Total 6 Click Score: 7    End of Session Equipment Utilized During Treatment: Oxygen Activity Tolerance: Treatment limited secondary to medical complications (Comment) (chronic, symptomatic orthostasis) Patient left: in bed;with call bell/phone within reach;with bed alarm set Nurse Communication: Mobility status PT Visit Diagnosis: Unsteadiness on feet (R26.81);Muscle weakness (generalized) (M62.81);Difficulty in walking, not elsewhere classified (R26.2);History of falling (Z91.81)    Time: [id number] PT Time Calculation (min) (ACUTE ONLY): 25 min   Charges:   PT Evaluation $PT Eval High Complexity: 1 High          Kareena Arrambide H. Owens Shark, PT, DPT, NCS 09/12/22, 2:18 PM [phone]

## 2022-09-13 DIAGNOSIS — R570 Cardiogenic shock: Secondary | ICD-10-CM | POA: Diagnosis not present

## 2022-09-13 LAB — COMPREHENSIVE METABOLIC PANEL
ALT: 21 U/L (ref 0–44)
AST: 23 U/L (ref 15–41)
Albumin: 1.5 g/dL — ABNORMAL LOW (ref 3.5–5.0)
Alkaline Phosphatase: 102 U/L (ref 38–126)
Anion gap: 4 — ABNORMAL LOW (ref 5–15)
BUN: 20 mg/dL (ref 8–23)
CO2: 29 mmol/L (ref 22–32)
Calcium: 7.1 mg/dL — ABNORMAL LOW (ref 8.9–10.3)
Chloride: 102 mmol/L (ref 98–111)
Creatinine, Ser: 1.2 mg/dL (ref 0.61–1.24)
GFR, Estimated: >60 mL/min (ref >60)
Glucose, Bld: 87 mg/dL (ref 70–99)
Potassium: 3.9 mmol/L (ref 3.5–5.1)
Sodium: 135 mmol/L (ref 135–145)
Total Bilirubin: 0.5 mg/dL (ref 0.3–1.2)
Total Protein: 4.3 g/dL — ABNORMAL LOW (ref 6.5–8.1)

## 2022-09-13 LAB — CBC
HCT: 34 % — ABNORMAL LOW (ref 39.0–52.0)
Hemoglobin: 10.9 g/dL — ABNORMAL LOW (ref 13.0–17.0)
MCH: 27.3 pg (ref 26.0–34.0)
MCHC: 32.1 g/dL (ref 30.0–36.0)
MCV: 85.2 fL (ref 80.0–100.0)
Platelets: 202 10*3/uL (ref 150–400)
RBC: 3.99 MIL/uL — ABNORMAL LOW (ref 4.22–5.81)
RDW: 17.2 % — ABNORMAL HIGH (ref 11.5–15.5)
WBC: 8.5 10*3/uL (ref 4.0–10.5)
nRBC: 0 % (ref 0.0–0.2)

## 2022-09-13 LAB — PHOSPHORUS: Phosphorus: 3.5 mg/dL (ref 2.5–4.6)

## 2022-09-13 LAB — MAGNESIUM: Magnesium: 2.2 mg/dL (ref 1.7–2.4)

## 2022-09-13 NOTE — TOC Progression Note (Signed)
Transition of Care [facility]) - Progression Note    Patient Details  Name: Ernest [name] MRN: [id number] Date of Birth: 22-Apr-1946  Transition of Care [facility]) CM/SW Contact  Izola Price, RN Phone Number: 09/13/2022, 5:33 PM  Clinical Narrative:  3/23: Contacted spouse, [name], to go over bed offers so far. Local offers included [place] and [facility], but she did not like [facility] rating and flags on [email or web address]. She was willing to call [facility] to discuss further and had a meeting with [employer] in patient's room tomorrow, 09/14/22. RN CM weekend number given in case [employer] wants to discuss with spouse/patient present/permission given. She would like to stay locally if possible. Simmie Davies RN CM     Expected Discharge Plan: Skilled Nursing Facility Barriers to Discharge: Continued Medical Work up  Expected Discharge Plan and Services       Living arrangements for the past 2 months: Single Family Home                                       Social Determinants of Health (SDOH) Interventions SDOH Screenings   Food Insecurity: No Food Insecurity (09/08/2022)  Housing: Low Risk  (09/08/2022)  Transportation Needs: No Transportation Needs (09/08/2022)  Utilities: Not At Risk (09/08/2022)  Alcohol Screen: Low Risk  (06/21/2021)  Depression (PHQ2-9): Low Risk  (08/25/2022)  Financial Resource Strain: Low Risk  (06/21/2021)  Physical Activity: Inactive (06/21/2021)  Social Connections: Unknown (06/21/2021)  Stress: No Stress Concern Present (06/21/2021)  Tobacco Use: Low Risk  (09/11/2022)    Readmission Risk Interventions     No data to display

## 2022-09-13 NOTE — Progress Notes (Signed)
  Progress Note   Patient: Ernest [name] [id number] DOB: 12/16/1945 DOA: 09/11/2022     5 DOS: the patient was seen and examined on 09/13/2022   Brief hospital course:  Assessment and Plan: Acute on chronic respiratory failure secondary to fluid overload in the setting of acute on chronic diastolic dysfunction - s/p R heart cath on 09/02/2022 - Cardio/heart failure team has signed off   Acute kidney injury secondary to above - Resolved    Acute on chronic persistent atrial fibrillation - Amiodarone 400 mg PO bid  - Eliquis 5 mg PO bid     Orthostatic hypotension - Midodrine 15 mg PO tid  - TEDs and abdominal binder   Vitamin B12 deficiency - B12 1000 mcg PO daily   Hypokalemia - Resolved    Morbid obesity - Complicating care    Obesity hypoventilation syndrome - O2 support - Complicating care    GERD - Protonix 40 mg PO MWF   Normocytic anemia - Monitor    Leukocytosis likely reactive - Resolved    DVT prophylaxis: Eliquis 5 mg PO bid       Subjective: Pt seen and examined at the bedside. He is awaiting placement for SNF, based on the PT documentation.  Physical Exam: Vitals:   09/12/22 2000 09/12/22 2351 09/13/22 0520 09/13/22 0846  BP: 104/63 (!) 85/61 93/75 (!) 77/65  Pulse: 80 79 89 (!) 50  Resp: 18 20 18    Temp: 98.2 F (36.8 C) (!) 97.5 F (36.4 C) (!) 97.5 F (36.4 C) 97.7 F (36.5 C)  TempSrc: Oral Oral  Oral  SpO2: 100% 99% 100% 100%  Weight:      Height:       Constitutional:      Appearance: He is well-developed.  HENT:     Head: Normocephalic.     Mouth/Throat:     Mouth: Mucous membranes are moist.  Cardiovascular:     Rate and Rhythm: Normal rate and regular rhythm.  Pulmonary:     Effort: Pulmonary effort is normal.  Abdominal:     Palpations: Abdomen is soft.  Skin:    General: Skin is warm.  Neurological:     Mental Status: He is alert and oriented to person, place, and time.  Psychiatric:        Mood and  Affect: Mood normal.   Data Reviewed:   Disposition: Status is: Inpatient  Planned Discharge Destination: Skilled nursing facility    Time spent: 35 minutes  Author: Lucienne Minks , MD 09/13/2022 10:05 AM  For on call review [email or web address].

## 2022-09-14 DIAGNOSIS — J9611 Chronic respiratory failure with hypoxia: Secondary | ICD-10-CM

## 2022-09-14 DIAGNOSIS — I951 Orthostatic hypotension: Secondary | ICD-10-CM

## 2022-09-14 DIAGNOSIS — R570 Cardiogenic shock: Secondary | ICD-10-CM | POA: Diagnosis not present

## 2022-09-14 DIAGNOSIS — D72829 Elevated white blood cell count, unspecified: Secondary | ICD-10-CM

## 2022-09-14 DIAGNOSIS — I4819 Other persistent atrial fibrillation: Secondary | ICD-10-CM | POA: Diagnosis not present

## 2022-09-14 DIAGNOSIS — N179 Acute kidney failure, unspecified: Secondary | ICD-10-CM

## 2022-09-14 DIAGNOSIS — I5032 Chronic diastolic (congestive) heart failure: Secondary | ICD-10-CM

## 2022-09-14 LAB — COMPREHENSIVE METABOLIC PANEL
ALT: 21 U/L (ref 0–44)
AST: 19 U/L (ref 15–41)
Albumin: <1.5 g/dL — ABNORMAL LOW (ref 3.5–5.0)
Alkaline Phosphatase: 102 U/L (ref 38–126)
Anion gap: 3 — ABNORMAL LOW (ref 5–15)
BUN: 22 mg/dL (ref 8–23)
CO2: 28 mmol/L (ref 22–32)
Calcium: 7 mg/dL — ABNORMAL LOW (ref 8.9–10.3)
Chloride: 104 mmol/L (ref 98–111)
Creatinine, Ser: 1.15 mg/dL (ref 0.61–1.24)
GFR, Estimated: >60 mL/min (ref >60)
Glucose, Bld: 81 mg/dL (ref 70–99)
Potassium: 3.6 mmol/L (ref 3.5–5.1)
Sodium: 135 mmol/L (ref 135–145)
Total Bilirubin: 0.7 mg/dL (ref 0.3–1.2)
Total Protein: 4.2 g/dL — ABNORMAL LOW (ref 6.5–8.1)

## 2022-09-14 LAB — CBC
HCT: 33.6 % — ABNORMAL LOW (ref 39.0–52.0)
Hemoglobin: 11 g/dL — ABNORMAL LOW (ref 13.0–17.0)
MCH: 27.9 pg (ref 26.0–34.0)
MCHC: 32.7 g/dL (ref 30.0–36.0)
MCV: 85.3 fL (ref 80.0–100.0)
Platelets: 240 10*3/uL (ref 150–400)
RBC: 3.94 MIL/uL — ABNORMAL LOW (ref 4.22–5.81)
RDW: 17 % — ABNORMAL HIGH (ref 11.5–15.5)
WBC: 8.7 10*3/uL (ref 4.0–10.5)
nRBC: 0 % (ref 0.0–0.2)

## 2022-09-14 LAB — PHOSPHORUS: Phosphorus: 3.6 mg/dL (ref 2.5–4.6)

## 2022-09-14 LAB — MAGNESIUM: Magnesium: 2.3 mg/dL (ref 1.7–2.4)

## 2022-09-14 MED ORDER — ALBUMIN HUMAN 25 % IV SOLN
25.0000 g | Freq: Every day | INTRAVENOUS | Status: DC
  Administered 2022-09-14 – 2022-09-17 (×4): 25 g via INTRAVENOUS
  Filled 2022-09-14 (×4): qty 100

## 2022-09-14 NOTE — Assessment & Plan Note (Signed)
Resolved

## 2022-09-14 NOTE — Assessment & Plan Note (Signed)
Cardiogenic shock ruled out, patient did had right heart catheterization which shows decreased filling pressures.  Cardiology does not think that his current presentation is due to any cardiac reason.  Most likely autonomic dysfunction.

## 2022-09-14 NOTE — Assessment & Plan Note (Signed)
-  Continue amiodarone -Continue Eliquis

## 2022-09-14 NOTE — Assessment & Plan Note (Signed)
Likely reactive.  Resolved °

## 2022-09-14 NOTE — Assessment & Plan Note (Signed)
Estimated body mass index is 37.78 kg/m as calculated from the following:   Height as of this encounter: 6\' 1"  (1.854 m).   Weight as of this encounter: 129.9 kg.   -This complicates overall prognosis

## 2022-09-14 NOTE — Hospital Course (Addendum)
Taken from prior notes.  Ernest [name] is a 77 y.o. male with medical history significant of class III obesity, chronic venous insufficiency of lower extremities on Lasix, GERD, obesity hypoventilation syndrome and chronic respiratory failure on home O2 at 4L, HTN, recent diagnosis of A-fib, on amiodarone and Eliquis who underwent cardioversion in January 2024, chronic hypotension on midodrine, who presents to the ED after he went for a follow-up visit with his cardiologist today and was directed to come to the emergency room for further management. While at his cardiologist office patient was found to have dizziness after he had sat down for about 3 and half hours with associated shortness of breath.   Over the past 2 months Ernest [name] has had 2 prolonged admissions at [facility]. He was admitted from February 2 to February 9 for orthostatic hypotension and syncope. During that admission he was started on midodrine 15 mg 3 times daily, Florinef 20mg  and had an abdominal binder placed. He was sent to rehab but unfortunately had another fall there. He was once again admitted to [facility] where he had an extensive evaluation for hypotension including workup for VIPoma. Workup was essentially unremarkable.    ED course and data reviewed: Blood pressure 86/56, temperature 97.6, pulse 80-84 ,respiratory rate 14-22, saturating 90 mid to high 90s on 4 L intranasal oxygen. Potassium 3.1, sodium 138, chloride 97, creatinine 1.3, magnesium 1.5, BNP 144.  Patient underwent right heart catheterization which shows low pre and post filling pressures and preserved cardiac index. Echocardiogram with normal EF. Cardiology converted IV Lasix to torsemide as needed for weight gain or lower extremity edema.  Does not think that his hypotension is due to any cardiac reason. Most likely autonomic dysfunction, they were recommending potential tertiary care center referral or dysautonomia clinic.  PT recommending SNF, patient is to  go for long term care, wife is not interested in rehab.  3/24: Blood pressure remained borderline at 84/64.  Labs seems stable except significant hypoalbuminemia at <1.5.  Giving some IV albumin.  Need to have high-protein diet.  3/25: No new concern.  Still awaiting placement.  Albumin with some improvement to 1.8-continuing daily IV albumin and encouraging increased p.o. intake.

## 2022-09-14 NOTE — Progress Notes (Signed)
Progress Note   Patient: Ernest [name] [id number] DOB: March 17, 1946 DOA: 08/22/2022     6 DOS: the patient was seen and examined on 09/14/2022   Brief hospital course: Taken from prior notes.  Ernest [name] is a 77 y.o. male with medical history significant of class III obesity, chronic venous insufficiency of lower extremities on Lasix, GERD, obesity hypoventilation syndrome and chronic respiratory failure on home O2 at 4L, HTN, recent diagnosis of A-fib, on amiodarone and Eliquis who underwent cardioversion in January 2024, chronic hypotension on midodrine, who presents to the ED after he went for a follow-up visit with his cardiologist today and was directed to come to the emergency room for further management. While at his cardiologist office patient was found to have dizziness after he had sat down for about 3 and half hours with associated shortness of breath.   Over the past 2 months Ernest [name] has had 2 prolonged admissions at [facility]. He was admitted from February 2 to February 9 for orthostatic hypotension and syncope. During that admission he was started on midodrine 15 mg 3 times daily, Florinef 20mg  and had an abdominal binder placed. He was sent to rehab but unfortunately had another fall there. He was once again admitted to [facility] where he had an extensive evaluation for hypotension including workup for VIPoma. Workup was essentially unremarkable.    ED course and data reviewed: Blood pressure 86/56, temperature 97.6, pulse 80-84 ,respiratory rate 14-22, saturating 90 mid to high 90s on 4 L intranasal oxygen. Potassium 3.1, sodium 138, chloride 97, creatinine 1.3, magnesium 1.5, BNP 144.  Patient underwent right heart catheterization which shows low pre and post filling pressures and preserved cardiac index. Echocardiogram with normal EF. Cardiology converted IV Lasix to torsemide as needed for weight gain or lower extremity edema.  Does not think that his hypotension is due to  any cardiac reason. Most likely autonomic dysfunction, they were recommending potential tertiary care center referral or dysautonomia clinic.  PT recommending SNF, patient is to go for long term care, wife is not interested in rehab.  3/24: Blood pressure remained borderline at 84/64.  Labs seems stable except significant hypoalbuminemia at <1.5.  Giving some IV albumin.  Need to have high-protein diet.          Assessment and Plan: * Cardiogenic shock ([place]) Cardiogenic shock ruled out, patient did had right heart catheterization which shows decreased filling pressures.  Cardiology does not think that his current presentation is due to any cardiac reason.  Most likely autonomic dysfunction.  Orthostatic hypotension Most likely autonomic dysfunction.  Patient also has severe hypoalbuminemia which [name] be contributory. -Giving some albumin -Counseled for high-protein diet -Continue with midodrine 15 mg 3 times daily -TED hose and abdominal binder  Chronic diastolic CHF (congestive heart failure) ([place]) Patient admitted with concern of acute on chronic diastolic heart failure and volume overload.  Right heart cath with low filling pressures-none on 09/14/2022 Heart failure team signed off.  Persistent atrial fibrillation (HCC) -Continue amiodarone -Continue Eliquis  Chronic respiratory failure with hypoxia (HCC) Initial concern of acute on chronic respiratory failure secondary to fluid overload, now resolved and patient is back to baseline. -Continue with supplemental oxygen  Leukocytosis Likely reactive.  Resolved  Obesity, Class III, BMI 40-49.9 (morbid obesity) (HCC) Estimated body mass index is 37.78 kg/m as calculated from the following:   Height as of this encounter: 6\' 1"  (1.854 m).   Weight as of this encounter: 129.9 kg.   -  This complicates overall prognosis  Obesity hypoventilation syndrome (HCC) -Continue supplemental oxygen  AKI (acute kidney injury)  ([place]) Resolved.        Subjective: Patient was seen and examined today.  No new complaints.  Will discuss about taking high-protein diet as low albumin [name] also cause worsening of his orthostatic symptoms.  Physical Exam: Vitals:   09/13/22 1948 09/14/22 0601 09/14/22 0838 09/14/22 1303  BP: (!) 84/65 (!) 88/62 (!) 84/64 (!) 88/64  Pulse: 84 81 94 78  Resp: 18 18 18 18   Temp: 98.5 F (36.9 C) 97.9 F (36.6 C) 97.7 F (36.5 C)   TempSrc: Oral     SpO2: 98% 99% 100% 100%  Weight:  129.9 kg    Height:       General.  Obese gentleman, in no acute distress. Pulmonary.  Lungs clear bilaterally, normal respiratory effort. CV.  Regular rate and rhythm, no JVD, rub or murmur. Abdomen.  Soft, nontender, nondistended, BS positive. CNS.  Alert and oriented .  No focal neurologic deficit. Extremities.  No edema, no cyanosis, pulses intact and symmetrical. Psychiatry.  Judgment and insight appears normal.   Data Reviewed: Prior data reviewed  Family Communication: Discussed with wife on phone.  Disposition: Status is: Inpatient Remains inpatient appropriate because: Severity of illness  Planned Discharge Destination: Skilled nursing facility  DVT prophylaxis.  Eliquis Time spent: 40 minutes  This record has been created using Systems analyst. Errors have been sought and corrected,but may not always be located. Such creation errors do not reflect on the standard of care.   Author: Lorella Nimrod, MD 09/14/2022 1:16 PM  For on call review [email or web address].

## 2022-09-14 NOTE — Assessment & Plan Note (Signed)
Continue supplemental oxygen. 

## 2022-09-14 NOTE — Assessment & Plan Note (Signed)
Most likely autonomic dysfunction.  Patient also has severe hypoalbuminemia which can be contributory. -Giving some albumin -Counseled for high-protein diet -Continue with midodrine 15 mg 3 times daily -TED hose and abdominal binder

## 2022-09-14 NOTE — Assessment & Plan Note (Signed)
Patient admitted with concern of acute on chronic diastolic heart failure and volume overload.  Right heart cath with low filling pressures-none on 09/10/2022 Heart failure team signed off.

## 2022-09-14 NOTE — Assessment & Plan Note (Signed)
Initial concern of acute on chronic respiratory failure secondary to fluid overload, now resolved and patient is back to baseline. -Continue with supplemental oxygen

## 2022-09-15 DIAGNOSIS — I5032 Chronic diastolic (congestive) heart failure: Secondary | ICD-10-CM | POA: Diagnosis not present

## 2022-09-15 DIAGNOSIS — I951 Orthostatic hypotension: Secondary | ICD-10-CM | POA: Diagnosis not present

## 2022-09-15 DIAGNOSIS — R570 Cardiogenic shock: Secondary | ICD-10-CM | POA: Diagnosis not present

## 2022-09-15 DIAGNOSIS — I4819 Other persistent atrial fibrillation: Secondary | ICD-10-CM | POA: Diagnosis not present

## 2022-09-15 NOTE — Progress Notes (Signed)
Progress Note   Patient: Ernest [name] DOB: 10/31/1945 DOA: 09/09/2022     7 DOS: the patient was seen and examined on 09/15/2022   Brief hospital course: Taken from prior notes.  Ernest [name] is a 77 y.o. male with medical history significant of class III obesity, chronic venous insufficiency of lower extremities on Lasix, GERD, obesity hypoventilation syndrome and chronic respiratory failure on home O2 at 4L, HTN, recent diagnosis of A-fib, on amiodarone and Eliquis who underwent cardioversion in January 2024, chronic hypotension on midodrine, who presents to the ED after he went for a follow-up visit with his cardiologist today and was directed to come to the emergency room for further management. While at his cardiologist office patient was found to have dizziness after he had sat down for about 3 and half hours with associated shortness of breath.   Over the past 2 months Mr. Ernest [name] has had 2 prolonged admissions at [facility]. He was admitted from February 2 to February 9 for orthostatic hypotension and syncope. During that admission he was started on midodrine 15 mg 3 times daily, Florinef 20mg  and had an abdominal binder placed. He was sent to rehab but unfortunately had another fall there. He was once again admitted to [facility] where he had an extensive evaluation for hypotension including workup for VIPoma. Workup was essentially unremarkable.    ED course and data reviewed: Blood pressure 86/56, temperature 97.6, pulse 80-84 ,respiratory rate 14-22, saturating 90 mid to high 90s on 4 L intranasal oxygen. Potassium 3.1, sodium 138, chloride 97, creatinine 1.3, magnesium 1.5, BNP 144.  Patient underwent right heart catheterization which shows low pre and post filling pressures and preserved cardiac index. Echocardiogram with normal EF. Cardiology converted IV Lasix to torsemide as needed for weight gain or lower extremity edema.  Does not think that his hypotension is due to  any cardiac reason. Most likely autonomic dysfunction, they were recommending potential tertiary care center referral or dysautonomia clinic.  PT recommending SNF, patient is to go for long term care, wife is not interested in rehab.  3/24: Blood pressure remained borderline at 84/64.  Labs seems stable except significant hypoalbuminemia at <1.5.  Giving some IV albumin.  Need to have high-protein diet.  3/25: No new concern.   Assessment and Plan: * Cardiogenic shock ([place]) Cardiogenic shock ruled out, patient did had right heart catheterization which shows decreased filling pressures.  Cardiology does not think that his current presentation is due to any cardiac reason.  Most likely autonomic dysfunction.  Orthostatic hypotension Most likely autonomic dysfunction.  Patient also has severe hypoalbuminemia which [name] be contributory. -Giving some albumin -Counseled for high-protein diet -Continue with midodrine 15 mg 3 times daily -TED hose and abdominal binder  Chronic diastolic CHF (congestive heart failure) ([place]) Patient admitted with concern of acute on chronic diastolic heart failure and volume overload.  Right heart cath with low filling pressures-none on 09/19/2022 Heart failure team signed off.  Persistent atrial fibrillation (HCC) -Continue amiodarone -Continue Eliquis  Chronic respiratory failure with hypoxia (HCC) Initial concern of acute on chronic respiratory failure secondary to fluid overload, now resolved and patient is back to baseline. -Continue with supplemental oxygen  Leukocytosis Likely reactive.  Resolved  Obesity, Class III, BMI 40-49.9 (morbid obesity) (HCC) Estimated body mass index is 37.78 kg/m as calculated from the following:   Height as of this encounter: 6\' 1"  (1.854 m).   Weight as of this encounter: 129.9 kg.   -  This complicates overall prognosis  Obesity hypoventilation syndrome (HCC) -Continue supplemental oxygen  AKI (acute kidney  injury) ([place]) Resolved.        Subjective: Patient was lying comfortably in bed when seen today.  No new concerns.  Physical Exam: Vitals:   09/15/22 0012 09/15/22 0349 09/15/22 0815 09/15/22 1225  BP: (!) 88/63 90/67 (!) 89/67 (!) 85/64  Pulse: 76 74  74  Resp: 18 18 18 18   Temp: (!) 97.5 F (36.4 C) (!) 97.4 F (36.3 C) 98.1 F (36.7 C) 98.2 F (36.8 C)  TempSrc:   Oral Oral  SpO2: 99% 99% 100% 100%  Weight:  128 kg    Height:       General.  Obese elderly man, In no acute distress. Pulmonary.  Lungs clear bilaterally, normal respiratory effort. CV.  Regular rate and rhythm, no JVD, rub or murmur. Abdomen.  Soft, nontender, nondistended, BS positive. CNS.  Alert and oriented .  No focal neurologic deficit. Extremities.  No edema, no cyanosis, pulses intact and symmetrical. Psychiatry.  Judgment and insight appears normal.   Data Reviewed: Prior data reviewed  Family Communication: Discussed with wife on phone.  Disposition: Status is: Inpatient Remains inpatient appropriate because: Severity of illness  Planned Discharge Destination: Skilled nursing facility  DVT prophylaxis.  Eliquis Time spent: 39 minutes  This record has been created using Systems analyst. Errors have been sought and corrected,but may not always be located. Such creation errors do not reflect on the standard of care.   Author: Lorella Nimrod, MD 09/15/2022 2:19 PM  For on call review [email or web address].

## 2022-09-15 NOTE — Consult Note (Signed)
   [facility] CM Inpatient Consult   09/15/2022  Ernest [name] [birth year] [id number]  Orientation with [name], [facility] Liaison for review.   Location: [facility] Liaison screened remotely [facility]).   [facility]) Accountable Care Organization [ACO] Patient: [name])    Primary Care Provider:  Dr. Lavera Guise  with [facility]   Patient screened to have a 30 days readmission hospitalization with noted medium risk score for unplanned readmission risk.  Pt with 3 IP within 6 months.     Plan:  [facility] RN liaison will continue to follow ongoing disposition to assess for post hospital community care coordination/management needs.  Current plan for STR at SNF.  3/27 Pt d/c to [facility] for STR PAC-RN Does not follow patients at this SNF.  [facility] does not replace or interfere with any arrangements made by the Inpatient Transition of Care team.   For questions contact:    Raina Mina, RN, [place] Hours M-F 8:00 am to 5 pm [phone] mobile [phone] [Office toll free line][facility] Office Hours are M-F 8:30 - 5 pm 24 hour nurse advise line [phone] Conceirge  [email or web address]

## 2022-09-15 NOTE — Care Management Important Message (Signed)
Important Message  Patient Details  Name: Ernest [name] MRN: [id number] Date of Birth: 11/23/1945   Medicare Important Message Given:  Yes     Dannette Barbara 09/15/2022, 12:48 PM

## 2022-09-15 NOTE — Plan of Care (Signed)

## 2022-09-15 NOTE — Progress Notes (Signed)
Physical Therapy Treatment Patient Details Name: Ernest [name] MRN: [id number] DOB: 1946-03-26 Today's Date: 09/15/2022   History of Present Illness presented to ER secondary to SOB, dizziness; admitted for management of acute/chronic respiratory failure secondary to fluid overload, afib and chronic orthostatic hypotension (likely dysautonomia)   PT Comments    Patient is agreeable to PT and was seen after midodrine. Blood pressure monitored throughout session. Abdominal binder placed in sitting position and compression hose on throughout session. Blood pressures were, 77/60 HR 81 in supine, and sitting blood pressures taken every 1.5 minutes were 79/60, 74/58, 66/57 with heart rate in the 130's. The patient did not feel dizzy, however he has limited activity tolerance in sitting and deferred standing attempts due to hypotension in sitting. Patient participated with seated level exercises. Recommend to continue PT to maximize independence and facilitate return to prior level of function.    Recommendations for follow up therapy are one component of a multi-disciplinary discharge planning process, led by the attending physician.  Recommendations may be updated based on patient status, additional functional criteria and insurance authorization.  Follow Up Recommendations  Can patient physically be transported by private vehicle: No    Assistance Recommended at Discharge Frequent or constant Supervision/Assistance  Patient can return home with the following Two people to help with walking and/or transfers;Two people to help with bathing/dressing/bathroom   Equipment Recommendations   (to de determined at next level of care)    Recommendations for Other Services       Precautions / Restrictions Precautions Precautions: Fall Restrictions Weight Bearing Restrictions: No     Mobility  Bed Mobility Overal bed mobility: Needs Assistance       Supine to sit: Mod assist Sit to  supine: Mod assist   General bed mobility comments: assistance for BLE support. verbal cues for sequencing. head of bed elevated    Transfers                   General transfer comment: unable to progress to standing due to blood pressure. mean arterial pressures continued to drop slowly while sitting despite adominal binder and TED hose in place. sitting BP's taken every 1.5 minutes- 79/60, 74/58, 66/57 with heart rates in the 130's. no profound dizziness is reported with activity    Ambulation/Gait                   Stairs             Wheelchair Mobility    Modified Rankin (Stroke Patients Only)       Balance Overall balance assessment: Needs assistance Sitting-balance support: Feet supported Sitting balance-Leahy Scale: Good                                      Cognition Arousal/Alertness: Awake/alert Behavior During Therapy: WFL for tasks assessed/performed Overall Cognitive Status: Within Functional Limits for tasks assessed                                          Exercises Total Joint Exercises Long Arc Quad: AROM, Strengthening, Both, 10 reps, Seated Other Exercises Other Exercises: no loss of balance with seated exercises    General Comments        Pertinent Vitals/Pain Pain Assessment Pain Assessment: No/denies pain  Home Living                          Prior Function            PT Goals (current goals can now be found in the care plan section) Acute Rehab PT Goals Patient Stated Goal: To return home PT Goal Formulation: With patient Time For Goal Achievement: 10/18/2022 Potential to Achieve Goals: Fair Progress towards PT goals: Progressing toward goals    Frequency    Min 2X/week      PT Plan Current plan remains appropriate    Co-evaluation              AM-PAC PT "6 Clicks" Mobility   Outcome Measure  Help needed turning from your back to your side while  in a flat bed without using bedrails?: A Lot Help needed moving from lying on your back to sitting on the side of a flat bed without using bedrails?: A Lot Help needed moving to and from a bed to a chair (including a wheelchair)?: Total Help needed standing up from a chair using your arms (e.g., wheelchair or bedside chair)?: Total Help needed to walk in hospital room?: Total Help needed climbing 3-5 steps with a railing? : Total 6 Click Score: 8    End of Session   Activity Tolerance: Treatment limited secondary to medical complications (Comment) (chronic hypotension issues) Patient left: in bed;with call bell/phone within reach;with bed alarm set Nurse Communication: Mobility status PT Visit Diagnosis: Unsteadiness on feet (R26.81);Muscle weakness (generalized) (M62.81);Difficulty in walking, not elsewhere classified (R26.2);History of falling (Z91.81)     Time: [id number] PT Time Calculation (min) (ACUTE ONLY): 30 min  Charges:  $Therapeutic Activity: 23-37 mins                     Minna Merritts, PT, MPT    [name] 09/15/2022, 10:47 AM

## 2022-09-16 DIAGNOSIS — I951 Orthostatic hypotension: Secondary | ICD-10-CM | POA: Diagnosis not present

## 2022-09-16 DIAGNOSIS — R570 Cardiogenic shock: Secondary | ICD-10-CM | POA: Diagnosis not present

## 2022-09-16 DIAGNOSIS — I5032 Chronic diastolic (congestive) heart failure: Secondary | ICD-10-CM | POA: Diagnosis not present

## 2022-09-16 DIAGNOSIS — I4819 Other persistent atrial fibrillation: Secondary | ICD-10-CM | POA: Diagnosis not present

## 2022-09-16 LAB — COMPREHENSIVE METABOLIC PANEL
ALT: 18 U/L (ref 0–44)
AST: 18 U/L (ref 15–41)
Albumin: 1.8 g/dL — ABNORMAL LOW (ref 3.5–5.0)
Alkaline Phosphatase: 95 U/L (ref 38–126)
Anion gap: 3 — ABNORMAL LOW (ref 5–15)
BUN: 20 mg/dL (ref 8–23)
CO2: 27 mmol/L (ref 22–32)
Calcium: 7 mg/dL — ABNORMAL LOW (ref 8.9–10.3)
Chloride: 103 mmol/L (ref 98–111)
Creatinine, Ser: 0.95 mg/dL (ref 0.61–1.24)
GFR, Estimated: >60 mL/min (ref >60)
Glucose, Bld: 86 mg/dL (ref 70–99)
Potassium: 3.3 mmol/L — ABNORMAL LOW (ref 3.5–5.1)
Sodium: 133 mmol/L — ABNORMAL LOW (ref 135–145)
Total Bilirubin: 0.8 mg/dL (ref 0.3–1.2)
Total Protein: 4.2 g/dL — ABNORMAL LOW (ref 6.5–8.1)

## 2022-09-16 MED ORDER — POTASSIUM CHLORIDE CRYS ER 20 MEQ PO TBCR
40.0000 meq | EXTENDED_RELEASE_TABLET | Freq: Once | ORAL | Status: AC
  Administered 2022-09-16: 40 meq via ORAL
  Filled 2022-09-16: qty 2

## 2022-09-16 NOTE — Progress Notes (Signed)
Physical Therapy Treatment Patient Details Name: Ernest [name] MRN: [id number] DOB: 10/31/1945 Today's Date: 09/16/2022   History of Present Illness presented to ER secondary to SOB, dizziness; admitted for management of acute/chronic respiratory failure secondary to fluid overload, afib and chronic orthostatic hypotension (likely dysautonomia)    PT Comments    Patient is agreeable to PT and motivated to participate. Patient required assistance for bed mobility. Abdominal binder donned in sitting prior to standing with no dizziness reported with sitting upright.  +2 person assistance required for standing with cues for anterior weight shifting. Dizziness reported and standing tolerance limited to 1 minute. Unable to get blood pressure with standing. Recommend to continue PT to maximize independence and facilitate return to prior level of function.    Recommendations for follow up therapy are one component of a multi-disciplinary discharge planning process, led by the attending physician.  Recommendations may be updated based on patient status, additional functional criteria and insurance authorization.  Follow Up Recommendations  Can patient physically be transported by private vehicle: No    Assistance Recommended at Discharge Frequent or constant Supervision/Assistance  Patient can return home with the following Two people to help with walking and/or transfers;Two people to help with bathing/dressing/bathroom   Equipment Recommendations   (to be determined at next level of care)    Recommendations for Other Services       Precautions / Restrictions Precautions Precautions: Fall Restrictions Weight Bearing Restrictions: No     Mobility  Bed Mobility Overal bed mobility: Needs Assistance Bed Mobility: Supine to Sit, Sit to Supine     Supine to sit: Mod assist Sit to supine: Mod assist   General bed mobility comments: assistance for BLE and trunk support. increased  time and effort required. abdominal binder donned in sitting position prior to standing. no dizziness reported in sitting    Transfers Overall transfer level: Needs assistance Equipment used: Rolling walker (2 wheels) (bariatric rolling walker) Transfers: Sit to/from Stand Sit to Stand: Mod assist, +2 physical assistance, From elevated surface           General transfer comment: verbal cues for anterior weight shifting and hand placement for safety.    Ambulation/Gait               General Gait Details: not attempted due to poor standing tolerance and dizziness with standing   Stairs             Wheelchair Mobility    Modified Rankin (Stroke Patients Only)       Balance Overall balance assessment: Needs assistance Sitting-balance support: Feet supported Sitting balance-Leahy Scale: Good     Standing balance support: Bilateral upper extremity supported Standing balance-Leahy Scale: Poor Standing balance comment: external support required to maintain standing balance with standing tolerance of 1 minute                            Cognition Arousal/Alertness: Awake/alert Behavior During Therapy: WFL for tasks assessed/performed Overall Cognitive Status: Within Functional Limits for tasks assessed                                          Exercises General Exercises - Lower Extremity Long Arc Quad: AROM, Strengthening, Both, 15 reps, Seated (performed against lightly graded resistance) Other Exercises Other Exercises: scapular retration x 5 bouts with  cues for upright posture.    General Comments General comments (skin integrity, edema, etc.): blood pressure supine 98/64 with heart rate 80, sitting blood pressure 82/71 with heart rate 82. unable to get standing blood pressure (unable to stand long enough). dizziness reported with standing      Pertinent Vitals/Pain Pain Assessment Pain Assessment: No/denies pain    Home  Living                          Prior Function            PT Goals (current goals can now be found in the care plan section) Acute Rehab PT Goals Patient Stated Goal: to get to long term care PT Goal Formulation: With patient Time For Goal Achievement: 10/09/2022 Potential to Achieve Goals: Fair Progress towards PT goals: Progressing toward goals    Frequency    Min 2X/week      PT Plan Current plan remains appropriate    Co-evaluation              AM-PAC PT "6 Clicks" Mobility   Outcome Measure  Help needed turning from your back to your side while in a flat bed without using bedrails?: A Lot Help needed moving from lying on your back to sitting on the side of a flat bed without using bedrails?: A Lot Help needed moving to and from a bed to a chair (including a wheelchair)?: Total Help needed standing up from a chair using your arms (e.g., wheelchair or bedside chair)?: Total Help needed to walk in hospital room?: Total Help needed climbing 3-5 steps with a railing? : Total 6 Click Score: 8    End of Session Equipment Utilized During Treatment: Oxygen Activity Tolerance: Patient tolerated treatment well Patient left: in bed;with call bell/phone within reach;with bed alarm set   PT Visit Diagnosis: Unsteadiness on feet (R26.81);Muscle weakness (generalized) (M62.81);Difficulty in walking, not elsewhere classified (R26.2);History of falling (Z91.81)     Time: 1000-1031 PT Time Calculation (min) (ACUTE ONLY): 31 min  Charges:  $Therapeutic Activity: 23-37 mins                     Minna Merritts, PT, MPT    [name] 09/16/2022, 12:34 PM

## 2022-09-16 NOTE — Progress Notes (Signed)
Progress Note   Patient: Ernest [name] [id number] DOB: 1945-11-03 DOA: 09/05/2022     9 DOS: the patient was seen and examined on 09/17/2022   Brief hospital course: Taken from prior notes.  Ernest [name] is a 77 y.o. male with medical history significant of class III obesity, chronic venous insufficiency of lower extremities on Lasix, GERD, obesity hypoventilation syndrome and chronic respiratory failure on home O2 at 4L, HTN, recent diagnosis of A-fib, on amiodarone and Eliquis who underwent cardioversion in January 2024, chronic hypotension on midodrine, who presents to the ED after he went for a follow-up visit with his cardiologist today and was directed to come to the emergency room for further management. While at his cardiologist office patient was found to have dizziness after he had sat down for about 3 and half hours with associated shortness of breath.   Over the past 2 months Mr. Ernest [name] has had 2 prolonged admissions at [facility]. He was admitted from February 2 to February 9 for orthostatic hypotension and syncope. During that admission he was started on midodrine 15 mg 3 times daily, Florinef 20mg  and had an abdominal binder placed. He was sent to rehab but unfortunately had another fall there. He was once again admitted to [facility] where he had an extensive evaluation for hypotension including workup for VIPoma. Workup was essentially unremarkable.    ED course and data reviewed: Blood pressure 86/56, temperature 97.6, pulse 80-84 ,respiratory rate 14-22, saturating 90 mid to high 90s on 4 L intranasal oxygen. Potassium 3.1, sodium 138, chloride 97, creatinine 1.3, magnesium 1.5, BNP 144.  Patient underwent right heart catheterization which shows low pre and post filling pressures and preserved cardiac index. Echocardiogram with normal EF. Cardiology converted IV Lasix to torsemide as needed for weight gain or lower extremity edema.  Does not think that his hypotension is due to  any cardiac reason. Most likely autonomic dysfunction, they were recommending potential tertiary care center referral or dysautonomia clinic.  PT recommending SNF, patient is to go for long term care, wife is not interested in rehab.  3/24: Blood pressure remained borderline at 84/64.  Labs seems stable except significant hypoalbuminemia at <1.5.  Giving some IV albumin.  Need to have high-protein diet.  3/25: No new concern.Still awaiting placement.  Albumin with some improvement to 1.8-continuing daily IV albumin and encouraging increased p.o. intake.   Assessment and Plan: * Cardiogenic shock (Ernest [name]) Cardiogenic shock ruled out, patient did had right heart catheterization which shows decreased filling pressures.  Cardiology does not think that his current presentation is due to any cardiac reason.  Most likely autonomic dysfunction.  Orthostatic hypotension Most likely autonomic dysfunction.  Patient also has severe hypoalbuminemia which [name] be contributory. -Counseled for high-protein diet -Continue with midodrine 15 mg 3 times daily -TED hose and abdominal binder  Chronic diastolic CHF (congestive heart failure) (Ernest [name]) Patient admitted with concern of acute on chronic diastolic heart failure and volume overload.  Right heart cath with low filling pressures-none on 09/09/2022 Heart failure team signed off.  Persistent atrial fibrillation (Ernest [name]) -Continue amiodarone -Continue Eliquis  Chronic respiratory failure with hypoxia (Ernest [name]) Initial concern of acute on chronic respiratory failure secondary to fluid overload, now resolved and patient is back to baseline. -Continue with supplemental oxygen  Leukocytosis Likely reactive.  Resolved  Obesity, Class III, BMI 40-49.9 (morbid obesity) (Ernest [name]) Estimated body mass index is 37.78 kg/m as calculated from the following:   Height as of this encounter: 6'  1" (1.854 m).   Weight as of this encounter: 129.9 kg.   -This complicates overall  prognosis  Obesity hypoventilation syndrome (Ernest [name]) -Continue supplemental oxygen  AKI (acute kidney injury) (Ernest [name]) Resolved.  Exocrine pancreatic deficiency Possible SIBO At gi visit last year noted to have exocrine pancreatic deficiency, was on creon then, not clear why not currently on that, will need to discuss w/ patient. Patient denies diarrhea but think his abdominal symptoms (gurgling, bloating) are caused by SIBO. Not clear if he has had carbohydrate breath test or duodenal aspirate culture      Subjective: Patient with no new concerns today.    Physical Exam: Vitals:   09/17/22 0328 09/17/22 0350 09/17/22 0805 09/17/22 1129  BP:  (!) 102/52 100/67 (!) 89/67  Pulse:  79 90 81  Resp:  18 15 16   Temp:  (!) 97.4 F (36.3 C) 98.5 F (36.9 C) 98.9 F (37.2 C)  TempSrc:  Oral  Oral  SpO2:  99% 100% 100%  Weight: 128.4 kg     Height:       General.  Obese gentleman, in no acute distress. Pulmonary.  Lungs clear bilaterally, normal respiratory effort. CV.  Regular rate and rhythm, no JVD, rub or murmur. Abdomen.  Soft, nontender, nondistended, BS positive. CNS.  Alert and oriented .  No focal neurologic deficit. Extremities.  No edema, no cyanosis, pulses intact and symmetrical. Psychiatry.  Judgment and insight appears normal.   Data Reviewed: Prior data reviewed  Family Communication: brother updated @ bedside  Disposition: Status is: Inpatient Remains inpatient appropriate because: Severity of illness  Planned Discharge Destination: Skilled nursing facility  DVT prophylaxis.  Eliquis Time spent: 30  Author: Desma Maxim, MD 09/17/2022 2:37 PM  For on call review [email or web address].

## 2022-09-16 NOTE — TOC Progression Note (Signed)
Transition of Care [facility]) - Progression Note    Patient Details  Name: TYMARION MORRE MRN: [id number] Date of Birth: Dec 07, 1945  [facility]) CM/SW Contact  Laurena Slimmer, RN Phone Number: 09/16/2022, 10:47 AM  Clinical Narrative:    Damaris Schooner with Danielle from [facility]. FL2 progress and PT notes requested. She plans to tour facilites with patient's spouse later this week.  Clinical faxed to [phone]   Expected Discharge Plan: [place] Barriers to Discharge: Continued Medical Work up  Expected Discharge Plan and Services       Living arrangements for the past 2 months: Single Family Home                                       Social Determinants of Health (SDOH) Interventions SDOH Screenings   Food Insecurity: No Food Insecurity (09/08/2022)  Housing: Low Risk  (09/08/2022)  Transportation Needs: No Transportation Needs (09/08/2022)  Utilities: Not At Risk (09/08/2022)  Alcohol Screen: Low Risk  (06/21/2021)  Depression (PHQ2-9): Low Risk  (08/25/2022)  Financial Resource Strain: Low Risk  (06/21/2021)  Physical Activity: Inactive (06/21/2021)  Social Connections: Unknown (06/21/2021)  Stress: No Stress Concern Present (06/21/2021)  Tobacco Use: Low Risk  (09/11/2022)    Readmission Risk Interventions     No data to display

## 2022-09-17 DIAGNOSIS — G909 Disorder of the autonomic nervous system, unspecified: Secondary | ICD-10-CM | POA: Insufficient documentation

## 2022-09-17 MED ORDER — ACETAMINOPHEN 325 MG PO TABS
650.0000 mg | ORAL_TABLET | Freq: Four times a day (QID) | ORAL | Status: DC | PRN
  Administered 2022-09-17 – 2022-09-18 (×3): 650 mg via ORAL
  Filled 2022-09-17 (×3): qty 2

## 2022-09-17 MED ORDER — ONDANSETRON 4 MG PO TBDP
4.0000 mg | ORAL_TABLET | Freq: Four times a day (QID) | ORAL | Status: DC | PRN
  Administered 2022-09-17 – 2022-09-23 (×6): 4 mg via ORAL
  Filled 2022-09-17 (×7): qty 1

## 2022-09-18 DIAGNOSIS — I951 Orthostatic hypotension: Secondary | ICD-10-CM | POA: Diagnosis not present

## 2022-09-18 LAB — BASIC METABOLIC PANEL
Anion gap: 6 (ref 5–15)
BUN: 20 mg/dL (ref 8–23)
CO2: 26 mmol/L (ref 22–32)
Calcium: 7.3 mg/dL — ABNORMAL LOW (ref 8.9–10.3)
Chloride: 104 mmol/L (ref 98–111)
Creatinine, Ser: 0.98 mg/dL (ref 0.61–1.24)
GFR, Estimated: >60 mL/min (ref >60)
Glucose, Bld: 81 mg/dL (ref 70–99)
Potassium: 3.7 mmol/L (ref 3.5–5.1)
Sodium: 136 mmol/L (ref 135–145)

## 2022-09-18 MED ORDER — PANCRELIPASE (LIP-PROT-AMYL) 36000-114000 UNITS PO CPEP
36000.0000 [IU] | ORAL_CAPSULE | Freq: Three times a day (TID) | ORAL | Status: DC
  Administered 2022-09-19 – 2022-09-24 (×15): 36000 [IU] via ORAL
  Filled 2022-09-18 (×16): qty 1

## 2022-09-18 MED ORDER — TRAMADOL HCL 50 MG PO TABS
50.0000 mg | ORAL_TABLET | Freq: Four times a day (QID) | ORAL | Status: DC | PRN
  Administered 2022-09-18 – 2022-09-23 (×2): 50 mg via ORAL
  Filled 2022-09-18 (×3): qty 1

## 2022-09-18 MED ORDER — METRONIDAZOLE 500 MG PO TABS
500.0000 mg | ORAL_TABLET | Freq: Two times a day (BID) | ORAL | Status: AC
  Administered 2022-09-18 – 2022-09-23 (×10): 500 mg via ORAL
  Filled 2022-09-18 (×10): qty 1

## 2022-09-18 NOTE — Care Management Important Message (Signed)
Important Message  Patient Details  Name: Ernest [name] MRN: [id number] Date of Birth: 12/12/1945   Medicare Important Message Given:  Yes     Dannette Barbara 09/18/2022, 12:47 PM

## 2022-09-18 NOTE — Progress Notes (Addendum)
Progress Note   Patient: Ernest [name] [id number] DOB: April 30, 1946 DOA: 08/29/2022     10 DOS: the patient was seen and examined on 09/18/2022   Brief hospital course: Taken from prior notes.  Ernest [name] is a 77 y.o. male with medical history significant of class III obesity, chronic venous insufficiency of lower extremities on Lasix, GERD, obesity hypoventilation syndrome and chronic respiratory failure on home O2 at 4L, HTN, recent diagnosis of A-fib, on amiodarone and Eliquis who underwent cardioversion in January 2024, chronic hypotension on midodrine, who presents to the ED after he went for a follow-up visit with his cardiologist today and was directed to come to the emergency room for further management. While at his cardiologist office patient was found to have dizziness after he had sat down for about 3 and half hours with associated shortness of breath.   Over the past 2 months Ernest [name] has had 2 prolonged admissions at [facility]. He was admitted from February 2 to February 9 for orthostatic hypotension and syncope. During that admission he was started on midodrine 15 mg 3 times daily, Florinef 20mg  and had an abdominal binder placed. He was sent to rehab but unfortunately had another fall there. He was once again admitted to [facility] where he had an extensive evaluation for hypotension including workup for VIPoma. Workup was essentially unremarkable.    ED course and data reviewed: Blood pressure 86/56, temperature 97.6, pulse 80-84 ,respiratory rate 14-22, saturating 90 mid to high 90s on 4 L intranasal oxygen. Potassium 3.1, sodium 138, chloride 97, creatinine 1.3, magnesium 1.5, BNP 144.  Patient underwent right heart catheterization which shows low pre and post filling pressures and preserved cardiac index. Echocardiogram with normal EF. Cardiology converted IV Lasix to torsemide as needed for weight gain or lower extremity edema.  Does not think that his hypotension is due to  any cardiac reason. Most likely autonomic dysfunction, they were recommending potential tertiary care center referral or dysautonomia clinic.  PT recommending SNF, patient is to go for long term care, wife is not interested in rehab.  3/24: Blood pressure remained borderline at 84/64.  Labs seems stable except significant hypoalbuminemia at <1.5.  Giving some IV albumin.  Need to have high-protein diet.  3/25: No new concern.Still awaiting placement.  Albumin with some improvement to 1.8-continuing daily IV albumin and encouraging increased p.o. intake.   Assessment and Plan: * Cardiogenic shock ([place]) Cardiogenic shock ruled out, patient did had right heart catheterization which shows decreased filling pressures.  Cardiology does not think that his current presentation is due to any cardiac reason.    Orthostatic hypotension Autonomic dysfunction? Most likely autonomic dysfunction.  Patient also has severe hypoalbuminemia which [name] be contributory. -Counseled for high-protein diet -Continue with midodrine 15 mg 3 times daily -TED hose and abdominal binder - cardiology thinks Most likely autonomic dysfunction, advises tertiary care center dysautonomia clinic f/u. - requests outpatient palliative care referral which I've ordered  Chronic diastolic CHF (congestive heart failure) ([place]) Patient admitted with concern of acute on chronic diastolic heart failure and volume overload.  Right heart cath with low filling pressures-none on 08/24/2022 Heart failure team signed off.  Persistent atrial fibrillation (HCC) -Continue amiodarone -Continue Eliquis  Chronic respiratory failure with hypoxia (HCC) Initial concern of acute on chronic respiratory failure secondary to fluid overload, now resolved and patient is back to baseline. -Continue with supplemental oxygen  Leukocytosis Likely reactive.  Resolved  Obesity, Class III, BMI 40-49.9 (morbid  obesity) ([place]) Estimated body mass index is  37.78 kg/m as calculated from the following:   Height as of this encounter: 6\' 1"  (1.854 m).   Weight as of this encounter: 129.9 kg.   -This complicates overall prognosis  Obesity hypoventilation syndrome (HCC) -Continue supplemental oxygen  AKI (acute kidney injury) ([place]) Resolved.  Exocrine pancreatic deficiency Possible SIBO At gi visit last year noted to have exocrine pancreatic deficiency, was on creon then, patient not sure why not still on that, I will order. Patient denies diarrhea but think his abdominal symptoms (gurgling, bloating) are caused by SIBO. He has not had formal testing. He requests a course of metronidazole. I advised against but he very much wants, aware there are risks to unnecessary abx and is willing to accept those risks. Will order a course of metronidazole.       Subjective: some abdominal bloating and "gurgling." Loose stool no diarrhea. No nausea/vomiting  Physical Exam: Vitals:   09/18/22 0525 09/18/22 1028 09/18/22 1154 09/18/22 1423  BP: 95/67 92/77 (!) 86/58 (!) 83/60  Pulse: 84 98 79 80  Resp: 20 16 20 16   Temp: 98 F (36.7 C) 97.6 F (36.4 C) (!) 97.5 F (36.4 C) 98.2 F (36.8 C)  TempSrc:  Oral Oral   SpO2: 100% 100% 100% 100%  Weight: 128.6 kg     Height:       General.  Obese gentleman, in no acute distress. Pulmonary.  Lungs clear bilaterally, normal respiratory effort. CV.  Regular rate and rhythm, no JVD, rub or murmur. Abdomen.  Soft, obese, nontender, nondistended, BS positive. CNS.  Alert and oriented .  No focal neurologic deficit. Extremities.  No edema, no cyanosis, pulses intact and symmetrical. Psychiatry.  Judgment and insight appears normal.   Data Reviewed: Prior data reviewed  Family Communication: wife updated @ bedside 3/28  Disposition: Status is: Inpatient Remains inpatient appropriate because: Severity of illness  Planned Discharge Destination: Skilled nursing facility  DVT prophylaxis.   Eliquis Time spent: 30  Author: Desma Maxim, MD 09/18/2022 4:39 PM  For on call review [email or web address].

## 2022-09-18 NOTE — Progress Notes (Signed)
Report called to [facility] on 1a.  Transport called to move to Rm 149

## 2022-09-18 NOTE — TOC Progression Note (Signed)
Transition of Care [facility]) - Progression Note    Patient Details  Name: Ernest [name] MRN: [id number] Date of Birth: 02-20-1946  Transition of Care [facility]) CM/SW Contact  Laurena Slimmer, RN Phone Number: 09/18/2022, 1:52 PM  Clinical Narrative:    Damaris Schooner with Danielle from [facility]. She tour facilities with patient's wife. Patient wife prefers [facility].   Expected Discharge Plan: [place] Barriers to Discharge: Continued Medical Work up  Expected Discharge Plan and Services       Living arrangements for the past 2 months: Single Family Home                                       Social Determinants of Health (SDOH) Interventions SDOH Screenings   Food Insecurity: No Food Insecurity (09/08/2022)  Housing: Low Risk  (09/08/2022)  Transportation Needs: No Transportation Needs (09/08/2022)  Utilities: Not At Risk (09/08/2022)  Alcohol Screen: Low Risk  (06/21/2021)  Depression (PHQ2-9): Low Risk  (08/25/2022)  Financial Resource Strain: Low Risk  (06/21/2021)  Physical Activity: Inactive (06/21/2021)  Social Connections: Unknown (06/21/2021)  Stress: No Stress Concern Present (06/21/2021)  Tobacco Use: Low Risk  (09/11/2022)    Readmission Risk Interventions     No data to display

## 2022-09-18 NOTE — Progress Notes (Signed)
Physical Therapy Treatment Patient Details Name: HAVIER KELLAR MRN: [id number] DOB: Mar 20, 1946 Today's Date: 09/18/2022   History of Present Illness presented to ER secondary to SOB, dizziness; admitted for management of acute/chronic respiratory failure secondary to fluid overload, afib and chronic orthostatic hypotension (likely dysautonomia)    PT Comments    Patient is agreeable to PT. He reports feeling nausea this morning but requested to sit up. He continues to require physical assistance to sit upright. He has good sitting balance but sitting tolerance is limited to less than 10 minutes secondary to dizziness and fatigue. Patient declined abdominal binder today. Blood pressure monitored throughout session. Increased dizziness with upright activity that gradually worsened until returning to bed. PT will continue to follow to maximize independence and decrease caregiver burden.   Blood pressure: Supine- 79/59, HR 86 Sitting 0 minutes- 96/67, HR 132 Sitting 5 minutes- 70/52, HR 62   Recommendations for follow up therapy are one component of a multi-disciplinary discharge planning process, led by the attending physician.  Recommendations may be updated based on patient status, additional functional criteria and insurance authorization.  Follow Up Recommendations  Can patient physically be transported by private vehicle: No    Assistance Recommended at Discharge Frequent or constant Supervision/Assistance  Patient can return home with the following Two people to help with walking and/or transfers;Two people to help with bathing/dressing/bathroom   Equipment Recommendations  Other (comment) (to be determined at next level of care)    Recommendations for Other Services       Precautions / Restrictions Precautions Precautions: Fall Restrictions Weight Bearing Restrictions: No     Mobility  Bed Mobility Overal bed mobility: Needs Assistance Bed Mobility: Supine to Sit,  Sit to Supine     Supine to sit: Mod assist Sit to supine: Mod assist   General bed mobility comments: assistance for LE and trunk support. increased time and effort required. patient declined abdominal binder in sitting. blood pressure monitored throughout session    Transfers Overall transfer level: Needs assistance                Lateral/Scoot Transfers: Mod assist General transfer comment: 3 lateral scoots to the left with cues for technique. patient does report increased dizziness with anterior weight shifting. patient declined standing due to dizziness and overall not feeling well today    Ambulation/Gait                   Stairs             Wheelchair Mobility    Modified Rankin (Stroke Patients Only)       Balance Overall balance assessment: Needs assistance Sitting-balance support: Feet supported Sitting balance-Leahy Scale: Good                                      Cognition Arousal/Alertness: Awake/alert Behavior During Therapy: WFL for tasks assessed/performed Overall Cognitive Status: Within Functional Limits for tasks assessed                                          Exercises      General Comments General comments (skin integrity, edema, etc.): total sitting tolerance less than 10 minutes before requesting to return to bed. blood pressure supine 79/59, sitting 0 minutes 96/67, sitting  5 minutes 70/52      Pertinent Vitals/Pain      Home Living                          Prior Function            PT Goals (current goals can now be found in the care plan section) Acute Rehab PT Goals Patient Stated Goal: to get to long term care PT Goal Formulation: With patient Time For Goal Achievement: 10/12/2022 Potential to Achieve Goals: Fair Progress towards PT goals: Progressing toward goals    Frequency    Min 2X/week      PT Plan Current plan remains appropriate     Co-evaluation              AM-PAC PT "6 Clicks" Mobility   Outcome Measure  Help needed turning from your back to your side while in a flat bed without using bedrails?: A Lot Help needed moving from lying on your back to sitting on the side of a flat bed without using bedrails?: A Lot Help needed moving to and from a bed to a chair (including a wheelchair)?: Total Help needed standing up from a chair using your arms (e.g., wheelchair or bedside chair)?: Total Help needed to walk in hospital room?: Total Help needed climbing 3-5 steps with a railing? : Total 6 Click Score: 8    End of Session   Activity Tolerance: Patient tolerated treatment well Patient left: in bed;with call bell/phone within reach;with chair alarm set;with nursing/sitter in room Nurse Communication: Mobility status PT Visit Diagnosis: Unsteadiness on feet (R26.81);Muscle weakness (generalized) (M62.81);Difficulty in walking, not elsewhere classified (R26.2);History of falling (Z91.81)     Time: [id number] PT Time Calculation (min) (ACUTE ONLY): 20 min  Charges:  $Therapeutic Activity: 8-22 mins                     Minna Merritts, PT, MPT   [name] 09/18/2022, 12:40 PM

## 2022-09-19 DIAGNOSIS — I951 Orthostatic hypotension: Secondary | ICD-10-CM | POA: Diagnosis not present

## 2022-09-19 MED ORDER — METOCLOPRAMIDE HCL 5 MG/ML IJ SOLN
10.0000 mg | Freq: Four times a day (QID) | INTRAMUSCULAR | Status: DC | PRN
  Administered 2022-09-19: 10 mg via INTRAVENOUS
  Filled 2022-09-19: qty 2

## 2022-09-19 NOTE — Progress Notes (Signed)
Progress Note   Patient: Ernest [name] [id number] DOB: [birth year] DOA: 09/07/2022     11 DOS: the patient was seen and examined on 09/19/2022   Brief hospital course: Taken from prior notes.  KEYVAN DEVEAUX II is a 77 y.o. male with medical history significant of class III obesity, chronic venous insufficiency of lower extremities on Lasix, GERD, obesity hypoventilation syndrome and chronic respiratory failure on home O2 at 4L, HTN, recent diagnosis of A-fib, on amiodarone and Eliquis who underwent cardioversion in January 2024, chronic hypotension on midodrine, who presents to the ED after he went for a follow-up visit with his cardiologist today and was directed to come to the emergency room for further management. While at his cardiologist office patient was found to have dizziness after he had sat down for about 3 and half hours with associated shortness of breath.   Over the past 2 months [name] has had 2 prolonged admissions at [facility]. He was admitted from February 2 to February 9 for orthostatic hypotension and syncope. During that admission he was started on midodrine 15 mg 3 times daily, Florinef 20mg  and had an abdominal binder placed. He was sent to rehab but unfortunately had another fall there. He was once again admitted to [facility] where he had an extensive evaluation for hypotension including workup for VIPoma. Workup was essentially unremarkable.    ED course and data reviewed: Blood pressure 86/56, temperature 97.6, pulse 80-84 ,respiratory rate 14-22, saturating 90 mid to high 90s on 4 L intranasal oxygen. Potassium 3.1, sodium 138, chloride 97, creatinine 1.3, magnesium 1.5, BNP 144.  Patient underwent right heart catheterization which shows low pre and post filling pressures and preserved cardiac index. Echocardiogram with normal EF. Cardiology converted IV Lasix to torsemide as needed for weight gain or lower extremity edema.  Does not think that his hypotension is due to  any cardiac reason. Most likely autonomic dysfunction, they were recommending potential tertiary care center referral or dysautonomia clinic.  PT recommending SNF, patient is to go for long term care, wife is not interested in rehab.  3/24: Blood pressure remained borderline at 84/64.  Labs seems stable except significant hypoalbuminemia at <1.5.  Giving some IV albumin.  Need to have high-protein diet.  3/25: No new concern.Still awaiting placement.  Albumin with some improvement to 1.8-continuing daily IV albumin and encouraging increased p.o. intake.  3/26> : stable for discharge pending SNF placement   Assessment and Plan: * Cardiogenic shock ([place]) Cardiogenic shock ruled out, patient did had right heart catheterization which shows decreased filling pressures.  Cardiology does not think that his current presentation is due to any cardiac reason.    Orthostatic hypotension Autonomic dysfunction? Most likely autonomic dysfunction.  Patient also has severe hypoalbuminemia which can be contributory. -Counseled for high-protein diet -Continue with midodrine 15 mg 3 times daily -TED hose and abdominal binder - cardiology thinks Most likely autonomic dysfunction, advises tertiary care center dysautonomia clinic f/u. - requests outpatient palliative care referral which was ordered 0000000  Chronic diastolic CHF (congestive heart failure) [facility]) Patient admitted with concern of acute on chronic diastolic heart failure and volume overload.  Right heart cath with low filling pressures-none on 09/05/2022 Heart failure team signed off.  Persistent atrial fibrillation (HCC) -Continue amiodarone -Continue Eliquis  Chronic respiratory failure with hypoxia (HCC) Initial concern of acute on chronic respiratory failure secondary to fluid overload, now resolved and patient is back to baseline. -Continue with supplemental oxygen  Leukocytosis Likely  reactive.  Resolved  Obesity, Class III, BMI  40-49.9 (morbid obesity) (HCC) Estimated body mass index is 37.78 kg/m as calculated from the following:   Height as of this encounter: 6\' 1"  (1.854 m).   Weight as of this encounter: [id number] kg.  -This complicates overall prognosis  Obesity hypoventilation syndrome (HCC) -Continue supplemental oxygen  AKI (acute kidney injury) ([place]) Resolved.  Exocrine pancreatic deficiency Possible SIBO At gi visit last year noted to have exocrine pancreatic deficiency, was on creon then, patient not sure why not still on that, I will order. Patient denies diarrhea but think his abdominal symptoms (gurgling, bloating) are caused by SIBO. He has not had formal testing. He requests a course of metronidazole. I advised against but he very much wants, aware there are risks to unnecessary abx and is willing to accept those risks. 5 day course of metronidazole started 3/28      Subjective: some abdominal bloating and "gurgling" unchanged from yesterday  Physical Exam: Vitals:   09/18/22 1423 09/18/22 2201 09/19/22 0500 09/19/22 0818  BP: (!) 83/60 (!) 85/62  (!) 80/56  Pulse: 80 88  90  Resp: 16 18  18   Temp: 98.2 F (36.8 C) 97.9 F (36.6 C)  97.7 F (36.5 C)  TempSrc:    Oral  SpO2: 100% 99%  100%  Weight:   128.8 kg   Height:       General.  Obese gentleman, in no acute distress. Pulmonary.  Lungs clear bilaterally, normal respiratory effort. CV.  Regular rate and rhythm, no JVD, rub or murmur. Abdomen.  Soft, obese, nontender, nondistended, BS positive. CNS.  Alert and oriented .  No focal neurologic deficit. Extremities.  No edema, no cyanosis, pulses intact and symmetrical. Psychiatry.  Judgment and insight appears normal.   Data Reviewed: Prior data reviewed  Family Communication: wife updated @ bedside 3/29  Disposition: Status is: Inpatient Remains inpatient appropriate because: Severity of illness  Planned Discharge Destination: Skilled nursing facility  DVT prophylaxis.   Eliquis Time spent: 30  Author: Desma Maxim, MD 09/19/2022 12:22 PM  For on call review [email or web address].

## 2022-09-19 NOTE — Progress Notes (Signed)
Physical Therapy Treatment Patient Details Name: Ernest [name] MRN: [id number] DOB: 1945/11/05 Today's Date: 09/19/2022   History of Present Illness presented to ER secondary to SOB, dizziness; admitted for management of acute/chronic respiratory failure secondary to fluid overload, afib and chronic orthostatic hypotension (likely dysautonomia)    PT Comments    Patient was agreeable to PT. He reports feeling nauseous but requesting to try sitting up. Nausea did not worsen with activity, however activity tolerance continues to be limited by dizziness and low blood pressure with upright activity. Abdominal binder donned while sitting with compression hose in place. Total sitting time around 5 minutes before patient requesting to return to bed with sitting blood pressure of 63/47.  Significant improvement with dizziness with return to bed. Unsafe to attempt standing at this time. Recommend to continue PT to maximize independence with therapy follow up recommended after discharge.     Recommendations for follow up therapy are one component of a multi-disciplinary discharge planning process, led by the attending physician.  Recommendations may be updated based on patient status, additional functional criteria and insurance authorization.  Follow Up Recommendations  Can patient physically be transported by private vehicle: No    Assistance Recommended at Discharge Frequent or constant Supervision/Assistance  Patient can return home with the following Two people to help with walking and/or transfers;Two people to help with bathing/dressing/bathroom   Equipment Recommendations  Other (comment) (to be determined at next level of care)    Recommendations for Other Services       Precautions / Restrictions Precautions Precautions: Fall Restrictions Weight Bearing Restrictions: No     Mobility  Bed Mobility Overal bed mobility: Needs Assistance Bed Mobility: Supine to Sit, Sit to  Supine     Supine to sit: Mod assist Sit to supine: Mod assist   General bed mobility comments: assistance for trunk and BLE support to sit upright. assistance for LE support to return to bed    Transfers                   General transfer comment: unable to due to blood pressure and dizziness with sitting    Ambulation/Gait                   Stairs             Wheelchair Mobility    Modified Rankin (Stroke Patients Only)       Balance Overall balance assessment: Needs assistance Sitting-balance support: Feet supported Sitting balance-Leahy Scale: Good                                      Cognition Arousal/Alertness: Awake/alert Behavior During Therapy: WFL for tasks assessed/performed Overall Cognitive Status: Within Functional Limits for tasks assessed                                          Exercises      General Comments General comments (skin integrity, edema, etc.): cues for breathing techniques. total sitting time around 5 minutes. supine blood pressure 82/59, sitting blood pressure 63/47      Pertinent Vitals/Pain Pain Assessment Pain Assessment: No/denies pain    Home Living  Prior Function            PT Goals (current goals can now be found in the care plan section) Acute Rehab PT Goals Patient Stated Goal: to get to long term care PT Goal Formulation: With patient Time For Goal Achievement: 10/01/2022 Potential to Achieve Goals: Fair Progress towards PT goals: Progressing toward goals    Frequency    Min 2X/week      PT Plan Current plan remains appropriate    Co-evaluation              AM-PAC PT "6 Clicks" Mobility   Outcome Measure  Help needed turning from your back to your side while in a flat bed without using bedrails?: A Lot Help needed moving from lying on your back to sitting on the side of a flat bed without using  bedrails?: A Lot Help needed moving to and from a bed to a chair (including a wheelchair)?: Total Help needed standing up from a chair using your arms (e.g., wheelchair or bedside chair)?: Total Help needed to walk in hospital room?: Total Help needed climbing 3-5 steps with a railing? : Total 6 Click Score: 8    End of Session Equipment Utilized During Treatment: Oxygen Activity Tolerance: Patient tolerated treatment well Patient left: in bed;with call bell/phone within reach;with chair alarm set Nurse Communication: Mobility status PT Visit Diagnosis: Unsteadiness on feet (R26.81);Muscle weakness (generalized) (M62.81);Difficulty in walking, not elsewhere classified (R26.2);History of falling (Z91.81)     Time: [id number] PT Time Calculation (min) (ACUTE ONLY): 18 min  Charges:  $Therapeutic Activity: 8-22 mins                    Minna Merritts, PT, MPT    [name] 09/19/2022, 10:46 AM

## 2022-09-19 NOTE — TOC Progression Note (Addendum)
Transition of Care [facility]) - Progression Note    Patient Details  Name: Ernest [name] MRN: [id number] Date of Birth: Dec 27, 1945  Transition of Care [facility]) CM/SW [place], RN Phone Number: 09/19/2022, 12:16 PM  Clinical Narrative:    Spoke with the patient and his wife in the room They have chosen to go to [facility], I notified [facility] I answered all their questions He is on Continuous oxygen Ins pending, will transition to long term care after Short term rehab  [facility] confirmed that they will accept over the weekend   Expected Discharge Plan: [place] Barriers to Discharge: Continued Medical Work up  Expected Discharge Plan and Services       Living arrangements for the past 2 months: Single Family Home                                       Social Determinants of Health (SDOH) Interventions SDOH Screenings   Food Insecurity: No Food Insecurity (09/08/2022)  Housing: Low Risk  (09/08/2022)  Transportation Needs: No Transportation Needs (09/08/2022)  Utilities: Not At Risk (09/08/2022)  Alcohol Screen: Low Risk  (06/21/2021)  Depression (PHQ2-9): Low Risk  (08/25/2022)  Financial Resource Strain: Low Risk  (06/21/2021)  Physical Activity: Inactive (06/21/2021)  Social Connections: Unknown (06/21/2021)  Stress: No Stress Concern Present (06/21/2021)  Tobacco Use: Low Risk  (09/11/2022)    Readmission Risk Interventions     No data to display

## 2022-09-20 DIAGNOSIS — I951 Orthostatic hypotension: Secondary | ICD-10-CM | POA: Diagnosis not present

## 2022-09-20 NOTE — TOC Progression Note (Signed)
Transition of Care [facility]) - Progression Note    Patient Details  Name: Ernest [name] MRN: [id number] Date of Birth: January 29, 1946  Transition of Care [facility]) CM/SW [place], [place] Phone Number: 09/20/2022, 10:55 AM  Clinical Narrative:    CSW received vm from [place] at [facility] request for SNF is being reviewed by the Market researcher. TOC will continue to follow.   Expected Discharge Plan: Skilled Nursing Facility Barriers to Discharge: Insurance Authorization  Expected Discharge Plan and [place] arrangements for the past 2 months: Single Family Home                                       Social Determinants of Health (SDOH) Interventions SDOH Screenings   Food Insecurity: No Food Insecurity (09/08/2022)  Housing: Low Risk  (09/08/2022)  Transportation Needs: No Transportation Needs (09/08/2022)  Utilities: Not At Risk (09/08/2022)  Alcohol Screen: Low Risk  (06/21/2021)  Depression (PHQ2-9): Low Risk  (08/25/2022)  Financial Resource Strain: Low Risk  (06/21/2021)  Physical Activity: Inactive (06/21/2021)  Social Connections: Unknown (06/21/2021)  Stress: No Stress Concern Present (06/21/2021)  Tobacco Use: Low Risk  (09/11/2022)    Readmission Risk Interventions     No data to display

## 2022-09-20 NOTE — Progress Notes (Signed)
PROGRESS NOTE    Ernest [name]  [id number]  DOB: October 20, 1945  DOA: 09/16/2022 PCP: Merryl Hacker, No Outpatient Specialists:   Hospital course:  77 year old with chronic respiratory failure on 4 L home O2, A-fib on amiodarone and Eliquis s/p cardioversion, chronic hypotension likely secondary to autonomic dysfunction on midodrine, obesity via hypoventilation syndrome was admitted for decompensated HFpEF.  He has diuresed well and feels better.    Subjective:  Patient states he feels okay, notes that his generally feels weak but nothing specific.  Noted he had not had a bowel movement for 3 days however patient states that he can go on his own and declined to have any laxatives at present.   Objective: Vitals:   09/19/22 2318 09/20/22 0500 09/20/22 0727 09/20/22 1533  BP: 95/71  95/68 93/68  Pulse: 92  96 88  Resp: 16  15 17   Temp: 97.7 F (36.5 C)  97.9 F (36.6 C) 97.9 F (36.6 C)  TempSrc:      SpO2: 100%  100% 100%  Weight:  129.4 kg    Height:        Intake/Output Summary (Last 24 hours) at 09/20/2022 1702 Last data filed at 09/20/2022 1147 Gross per 24 hour  Intake 480 ml  Output 700 ml  Net -220 ml   Filed Weights   09/18/22 0525 09/19/22 0500 09/20/22 0500  Weight: 128.6 kg 128.8 kg 129.4 kg     Exam:  General: Chronically ill-appearing man looking weak lying in bed in NAD Eyes: sclera anicteric, conjuctiva mild injection bilaterally CVS: S1-S2, regular  Respiratory:  decreased air entry bilaterally secondary to decreased inspiratory effort, rales at bases  GI: NABS, soft, NT  LE: Warm and well-perfused  Data Reviewed:  Basic Metabolic Panel: Recent Labs  Lab 09/14/22 0412 09/16/22 0454 09/18/22 0706  NA 135 133* 136  K 3.6 3.3* 3.7  CL 104 103 104  CO2 28 27 26   GLUCOSE 81 86 81  BUN 22 20 20   CREATININE 1.15 0.95 0.98  CALCIUM 7.0* 7.0* 7.3*  MG 2.3  --   --   PHOS 3.6  --   --     CBC: Recent Labs  Lab 09/14/22 0412  WBC 8.7   HGB 11.0*  HCT 33.6*  MCV 85.3  PLT 240     Scheduled Meds:  amiodarone  400 mg Oral BID   apixaban  5 mg Oral BID   cyanocobalamin  1,000 mcg Oral Daily   lipase/protease/amylase  36,000 Units Oral TID AC   metroNIDAZOLE  500 mg Oral Q12H   midodrine  15 mg Oral TID WC   nystatin   Topical Daily   pantoprazole  40 mg Oral Q M,W,F   Continuous Infusions:   Assessment & Plan:   Disposition Patient is stable awaiting SNF placement Continue present management  Orthostatic hypotension secondary to autonomic dysfunction Doing well on midodrine 15 mg 3 times daily TED hose and abdominal binder Palliative care has been requested and pending  Decompensated HFpEF He has diuresed well and is doing well  Heart failure team has signed off Continue present medications  Atrial fibrillation Rate is controlled on amiodarone On Eliquis for secondary stroke prevention  Chronic respiratory failure with hypoxia Patient is back to his baseline home oxygen level Continue present management    DVT prophylaxis: Eliquis Code Status: DNR    Studies: No results found.  Principal Problem:   Orthostatic hypotension Active Problems:   Cardiogenic shock (  [place])   Chronic diastolic CHF (congestive heart failure) (HCC)   Persistent atrial fibrillation (HCC)   Chronic respiratory failure with hypoxia (HCC)   Leukocytosis   Obesity, Class III, BMI 40-49.9 (morbid obesity) ([place])   Obesity hypoventilation syndrome ([place])   AKI (acute kidney injury) ([place])   Autonomic dysfunction     [name], [facility]  If 7PM-7AM, please contact night-coverage [email or web address]   LOS: 12 days

## 2022-09-21 DIAGNOSIS — I951 Orthostatic hypotension: Secondary | ICD-10-CM | POA: Diagnosis not present

## 2022-09-21 LAB — URINALYSIS, ROUTINE W REFLEX MICROSCOPIC
Bacteria, UA: NONE SEEN
Bilirubin Urine: NEGATIVE
Glucose, UA: NEGATIVE mg/dL
Hgb urine dipstick: NEGATIVE
Ketones, ur: NEGATIVE mg/dL
Nitrite: NEGATIVE
Protein, ur: NEGATIVE mg/dL
Specific Gravity, Urine: 1.026 (ref 1.005–1.030)
pH: 5 (ref 5.0–8.0)

## 2022-09-21 LAB — CBC
HCT: 35.5 % — ABNORMAL LOW (ref 39.0–52.0)
Hemoglobin: 11.4 g/dL — ABNORMAL LOW (ref 13.0–17.0)
MCH: 27.3 pg (ref 26.0–34.0)
MCHC: 32.1 g/dL (ref 30.0–36.0)
MCV: 85.1 fL (ref 80.0–100.0)
Platelets: 305 10*3/uL (ref 150–400)
RBC: 4.17 MIL/uL — ABNORMAL LOW (ref 4.22–5.81)
RDW: 16.7 % — ABNORMAL HIGH (ref 11.5–15.5)
WBC: 8.9 10*3/uL (ref 4.0–10.5)
nRBC: 0 % (ref 0.0–0.2)

## 2022-09-21 NOTE — TOC Progression Note (Signed)
Transition of Care [facility]) - Progression Note    Patient Details  Name: RAJDEEP ABBS MRN: [id number] Date of Birth: 07-28-1945  Transition [facility] Care [facility]) CM/SW Contact  Valente David, RN Phone Number: 09/21/2022, 12:33 PM  Clinical Narrative:     Candace Cruise remains pending.   Expected Discharge Plan: Skilled Nursing Facility Barriers to Discharge: Insurance Authorization  Expected Discharge Plan and [place] arrangements for the past 2 months: Single Family Home                                       Social Determinants of Health (SDOH) Interventions SDOH Screenings   Food Insecurity: No Food Insecurity (09/08/2022)  Housing: Low Risk  (09/08/2022)  Transportation Needs: No Transportation Needs (09/08/2022)  Utilities: Not At Risk (09/08/2022)  Alcohol Screen: Low Risk  (06/21/2021)  Depression (PHQ2-9): Low Risk  (08/25/2022)  Financial Resource Strain: Low Risk  (06/21/2021)  Physical Activity: Inactive (06/21/2021)  Social Connections: Unknown (06/21/2021)  Stress: No Stress Concern Present (06/21/2021)  Tobacco Use: Low Risk  (09/11/2022)    Readmission Risk Interventions     No data to display

## 2022-09-21 NOTE — Progress Notes (Addendum)
PROGRESS NOTE    Ernest [name]  [id number]  DOB: Nov 06, 1945  DOA: 09/08/2022 PCP: Ernest [name], No Outpatient Specialists:   Hospital course:  77 year old with chronic respiratory failure on 4 L home O2, A-fib on amiodarone and Eliquis s/p cardioversion, chronic hypotension likely secondary to autonomic dysfunction on midodrine, obesity via hypoventilation syndrome was admitted for decompensated HFpEF.  He has diuresed well and feels better.    Subjective:  Patient states he feels okay, same as he has last couple of days. I noted that patient has very dark urine, denies history of hematuria.  Admits he is not drinking very much water.   Objective: Vitals:   09/20/22 2130 09/20/22 2341 09/21/22 0614 09/21/22 0836  BP: (!) 85/61 120/85  (!) 84/69  Pulse: 84 95  93  Resp:  20  16  Temp:  98.7 F (37.1 C)  98.1 F (36.7 C)  TempSrc:      SpO2:  100%  100%  Weight:   128.8 kg   Height:        Intake/Output Summary (Last 24 hours) at 09/21/2022 1544 Last data filed at 09/21/2022 0533 Gross per 24 hour  Intake 120 ml  Output 450 ml  Net -330 ml    Filed Weights   09/19/22 0500 09/20/22 0500 09/21/22 [id number]  Weight: 128.8 kg 129.4 kg 128.8 kg     Exam:  General: Chronically ill-appearing man looking weak lying in bed in NAD Eyes: sclera anicteric, conjuctiva mild injection bilaterally CVS: S1-S2, regular  Respiratory:  decreased air entry bilaterally secondary to decreased inspiratory effort, rales at bases  GI: NABS, soft, NT  LE: Warm and well-perfused  Data Reviewed:  Basic Metabolic Panel: Recent Labs  Lab 09/16/22 0454 09/18/22 0706  NA 133* 136  K 3.3* 3.7  CL 103 104  CO2 27 26  GLUCOSE 86 81  BUN 20 20  CREATININE 0.95 0.98  CALCIUM 7.0* 7.3*     CBC: Recent Labs  Lab 09/21/22 0514  WBC 8.9  HGB 11.4*  HCT 35.5*  MCV 85.1  PLT 305      Scheduled Meds:  amiodarone  400 mg Oral BID   apixaban  5 mg Oral BID   cyanocobalamin   1,000 mcg Oral Daily   lipase/protease/amylase  36,000 Units Oral TID AC   metroNIDAZOLE  500 mg Oral Q12H   midodrine  15 mg Oral TID WC   nystatin   Topical Daily   pantoprazole  40 mg Oral Q M,W,F   Continuous Infusions:   Assessment & Plan:   Disposition Patient is stable awaiting SNF placement Continue present management  Dark urine I am unclear if this is blood or just very concentrated urine UA ordered H&H are stable, no leukocytosis, patient is afebrile BP is low as per usual  Orthostatic hypotension secondary to autonomic dysfunction Doing well on midodrine 15 mg 3 times daily TED hose and abdominal binder Palliative care has been requested and pending  Decompensated HFpEF He has diuresed well and is doing well  Heart failure team has signed off Continue present medications  Atrial fibrillation Rate is controlled on amiodarone On Eliquis for secondary stroke prevention  Chronic respiratory failure with hypoxia Patient is back to his baseline home oxygen level Continue present management    DVT prophylaxis: Eliquis Code Status: DNR    Studies: No results found.  Principal Problem:   Orthostatic hypotension Active Problems:   Cardiogenic shock (HCC)   Chronic diastolic  CHF (congestive heart failure) (HCC)   Persistent atrial fibrillation (HCC)   Chronic respiratory failure with hypoxia (HCC)   Leukocytosis   Obesity, Class III, BMI 40-49.9 (morbid obesity) ([place])   Obesity hypoventilation syndrome ([place])   AKI (acute kidney injury) ([place])   Autonomic dysfunction     Ernest [name] Derek Jack, [facility]  If 7PM-7AM, please contact night-coverage [email or web address]   LOS: 13 days

## 2022-09-21 NOTE — Plan of Care (Signed)

## 2022-09-22 DIAGNOSIS — I951 Orthostatic hypotension: Secondary | ICD-10-CM | POA: Diagnosis not present

## 2022-09-22 LAB — BASIC METABOLIC PANEL
Anion gap: 9 (ref 5–15)
BUN: 25 mg/dL — ABNORMAL HIGH (ref 8–23)
CO2: 22 mmol/L (ref 22–32)
Calcium: 7.1 mg/dL — ABNORMAL LOW (ref 8.9–10.3)
Chloride: 102 mmol/L (ref 98–111)
Creatinine, Ser: 1.05 mg/dL (ref 0.61–1.24)
GFR, Estimated: >60 mL/min (ref >60)
Glucose, Bld: 134 mg/dL — ABNORMAL HIGH (ref 70–99)
Potassium: 3.7 mmol/L (ref 3.5–5.1)
Sodium: 133 mmol/L — ABNORMAL LOW (ref 135–145)

## 2022-09-22 MED ORDER — SODIUM CHLORIDE 0.9 % IV SOLN: INTRAVENOUS | Status: AC

## 2022-09-22 NOTE — TOC Progression Note (Addendum)
Transition of Care [facility]) - Progression Note    Patient Details  Name: Ernest [name] MRN: [id number] Date of Birth: 10-May-1946  Transition of Care [facility]) CM/SW [place], LCSW Phone Number: 09/22/2022, 8:17 AM  Clinical Narrative:  Insurance is requesting a peer-to-peer review. Sent information to MD in a secure chat. Deadline is today at 12:00.   2:35 pm: Insurance authorization denied. Patient and wife are aware. Wife will call today to start appeal process and CSW will fax clinicals for review. Private pay amount for long-term care at [facility] is $7,950 every 30 days. Wife is aware.  3:33 pm: Faxed clinicals for appeal.  4:21 pm: Faxed Appointment of Representative form to appeals dept.  Expected Discharge Plan: Skilled Nursing Facility Barriers to Discharge: Insurance Authorization  Expected Discharge Plan and [place] arrangements for the past 2 months: Single Family Home                                       Social Determinants of Health (SDOH) Interventions SDOH Screenings   Food Insecurity: No Food Insecurity (09/08/2022)  Housing: Low Risk  (09/08/2022)  Transportation Needs: No Transportation Needs (09/08/2022)  Utilities: Not At Risk (09/08/2022)  Alcohol Screen: Low Risk  (06/21/2021)  Depression (PHQ2-9): Low Risk  (08/25/2022)  Financial Resource Strain: Low Risk  (06/21/2021)  Physical Activity: Inactive (06/21/2021)  Social Connections: Unknown (06/21/2021)  Stress: No Stress Concern Present (06/21/2021)  Tobacco Use: Low Risk  (09/11/2022)    Readmission Risk Interventions     No data to display

## 2022-09-22 NOTE — Care Management Important Message (Signed)
Important Message  Patient Details  Name: Ernest [name] MRN: [id number] Date of Birth: [birth year]   Medicare Important Message Given:  Yes     Dannette Barbara 09/22/2022, 10:47 AM

## 2022-09-22 NOTE — Plan of Care (Signed)
  Problem: Health Behavior/Discharge Planning: Goal: Ability to safely manage health-related needs after discharge will improve Outcome: Progressing   Problem: Education: Goal: Knowledge of General Education information will improve Description: Including pain rating scale, medication(s)/side effects and non-pharmacologic comfort measures Outcome: Progressing

## 2022-09-22 NOTE — Progress Notes (Addendum)
PROGRESS NOTE    Ernest [name]  [id number]  DOB: [birth year]  DOA: 09/08/2022 PCP: Merryl Hacker, No Outpatient Specialists:   Hospital course:  77 year old with chronic respiratory failure on 4 L home O2, A-fib on amiodarone and Eliquis s/p cardioversion, chronic hypotension likely secondary to autonomic dysfunction on midodrine, obesity via hypoventilation syndrome was admitted for decompensated HFpEF.  He has diuresed well and feels better.    Subjective:  Patient states he feels okay, same as he has last couple of days.  We discussed his dark urine and his need to drink fluids.  Patient states that his urine is always dark.  Patient subsequently told RN that he would like to transition to comfort care because he does not want his wife to have to sell the house to pay long-term care because for him.   Objective: Vitals:   09/22/22 0041 09/22/22 0744 09/22/22 0800 09/22/22 0817  BP: (!) 112/97 (!) 82/68 (!) 69/56 (!) 80/67  Pulse: 76 70 86   Resp: 18 20 18    Temp: 97.6 F (36.4 C) 98 F (36.7 C)    TempSrc:  Oral    SpO2: 97% 97%    Weight:      Height:        Intake/Output Summary (Last 24 hours) at 09/22/2022 1525 Last data filed at 09/22/2022 0300 Gross per 24 hour  Intake 120 ml  Output 50 ml  Net 70 ml    Filed Weights   09/19/22 0500 09/20/22 0500 09/21/22 0614  Weight: 128.8 kg 129.4 kg 128.8 kg     Exam:  General: Chronically ill-appearing man looking weak lying in bed in NAD Eyes: sclera anicteric, conjuctiva mild injection bilaterally CVS: S1-S2, regular  Respiratory:  decreased air entry bilaterally secondary to decreased inspiratory effort, rales at bases  GI: NABS, soft, NT  LE: Warm and well-perfused  Data Reviewed:  Basic Metabolic Panel: Recent Labs  Lab 09/16/22 0454 09/18/22 0706 09/22/22 0905  NA 133* 136 133*  K 3.3* 3.7 3.7  CL 103 104 102  CO2 27 26 22   GLUCOSE 86 81 134*  BUN 20 20 25*  CREATININE 0.95 0.98 1.05  CALCIUM  7.0* 7.3* 7.1*     CBC: Recent Labs  Lab 09/21/22 0514  WBC 8.9  HGB 11.4*  HCT 35.5*  MCV 85.1  PLT 305      Scheduled Meds:  amiodarone  400 mg Oral BID   apixaban  5 mg Oral BID   cyanocobalamin  1,000 mcg Oral Daily   lipase/protease/amylase  36,000 Units Oral TID AC   metroNIDAZOLE  500 mg Oral Q12H   midodrine  15 mg Oral TID WC   nystatin   Topical Daily   pantoprazole  40 mg Oral Q M,W,F   Continuous Infusions:  sodium chloride 100 mL/hr at 09/22/22 0951     Assessment & Plan:   End-of-life decision-making Patient told RN that he would like to transition to comfort care because he does not want his wife to have to sell the house to pay for long-term care for him. Insurance declined authorization for SNF/rehab as patient had not made much progress at previous to 2 attempts at rehab and because he is not being anticipatory with PT here in house. Palliative care consult placed to lower these decisions.  Dark urine UA shows specific gravity of 1026, no blood Will order gentle hydration of 1 L over 10 hours Patient states that he will try to  drink more but notes that his urine is always very dark and is not concerned about this.  Orthostatic hypotension secondary to autonomic dysfunction On midodrine 15 mg 3 times daily TED hose and abdominal binder Palliative care has been requested and pending  Decompensated HFpEF He has diuresed well and is doing well  Heart failure team has signed off Continue present medications  Atrial fibrillation Rate is controlled on amiodarone On Eliquis for secondary stroke prevention  Chronic respiratory failure with hypoxia Patient is back to his baseline home oxygen level Continue present management    DVT prophylaxis: Eliquis Code Status: DNR    Studies: No results found.  Principal Problem:   Orthostatic hypotension Active Problems:   Cardiogenic shock   Chronic diastolic CHF (congestive heart failure)    Persistent atrial fibrillation   Chronic respiratory failure with hypoxia   Leukocytosis   Obesity, Class III, BMI 40-49.9 (morbid obesity)   Obesity hypoventilation syndrome   AKI (acute kidney injury)   Autonomic dysfunction     Ernest [name] Derek Jack, [facility]  If 7PM-7AM, please contact night-coverage [email or web address]   LOS: 14 days

## 2022-09-22 DEATH — deceased

## 2022-09-23 DIAGNOSIS — I951 Orthostatic hypotension: Secondary | ICD-10-CM | POA: Diagnosis not present

## 2022-09-23 DIAGNOSIS — I509 Heart failure, unspecified: Secondary | ICD-10-CM

## 2022-09-23 DIAGNOSIS — I4891 Unspecified atrial fibrillation: Secondary | ICD-10-CM | POA: Diagnosis not present

## 2022-09-23 DIAGNOSIS — G909 Disorder of the autonomic nervous system, unspecified: Secondary | ICD-10-CM | POA: Diagnosis not present

## 2022-09-23 NOTE — TOC Progression Note (Signed)
Transition of Care [facility]) - Progression Note    Patient Details  Name: Ernest [name] MRN: [id number] Date of Birth: 10/03/1945  Transition of Care [facility]) CM/SW [place], LCSW Phone Number: 09/23/2022, 2:23 PM  Clinical Narrative:  Josem Kaufmann [id number]. Valid 4/2-4/5. Patient's wife, MD, and SNF admissions coordinator is aware. Wife is still very interested in speaking with palliative. Sent NP secure chat to notify.  Expected Discharge Plan: Skilled Nursing Facility Barriers to Discharge: Insurance Authorization  Expected Discharge Plan and [place] arrangements for the past 2 months: Single Family Home                                       Social Determinants of Health (SDOH) Interventions SDOH Screenings   Food Insecurity: No Food Insecurity (09/08/2022)  Housing: Low Risk  (09/08/2022)  Transportation Needs: No Transportation Needs (09/08/2022)  Utilities: Not At Risk (09/08/2022)  Alcohol Screen: Low Risk  (06/21/2021)  Depression (PHQ2-9): Low Risk  (08/25/2022)  Financial Resource Strain: Low Risk  (06/21/2021)  Physical Activity: Inactive (06/21/2021)  Social Connections: Unknown (06/21/2021)  Stress: No Stress Concern Present (06/21/2021)  Tobacco Use: Low Risk  (09/11/2022)    Readmission Risk Interventions     No data to display

## 2022-09-23 NOTE — Progress Notes (Addendum)
       CROSS COVER NOTE  NAME: TAJIR NESBITT MRN: [id number] DOB : [birth year] ATTENDING PHYSICIAN: Oren Binet*    Date of Service   09/23/2022   HPI/Events of Note   Report/Request B/p 64/45 map 53, asymptomatic.   On Review of chart Mr Goldsworthy has chronic hypotension and has been hypotensive throughout the day with SBPs in the 70s-80s   Interventions   Assessment/Plan:  Give 8AM Midodrine early      To reach the provider On-Call:   7AM- 7PM see care teams to locate the attending and reach out to them via [email or web address] Password: [email or web address] 7PM-7AM contact night-coverage If you still have difficulty reaching the appropriate provider, please page the [facility] (Director on Call) for [facility] on amion for assistance  This document was prepared using Systems analyst and may include unintentional dictation errors.  Neomia Glass DNP, MBA, FNP-BC, PMHNP-BC Nurse Practitioner [facility] Pager [phone]

## 2022-09-23 NOTE — Progress Notes (Signed)
[place] at [place] NAME: Ernest [name]    MR#:  [id number]  DATE OF BIRTH:  [birth year]  SUBJECTIVE:   Patient sitting upright and eating breakfast. No family at bedside. Denies any dizziness. Tells me his blood pressure has always been on the lower side. Does not ambulate much due to his chronic illness. Lives at home with wife.   VITALS:  Blood pressure (!) 73/55, pulse 88, temperature 97.8 F (36.6 C), temperature source Oral, resp. rate 18, height 6\' 1"  (1.854 m), weight 129.6 kg, SpO2 98 %.  PHYSICAL EXAMINATION:   GENERAL:  77 y.o.-year-old patient with no acute distress. obese LUNGS: Normal breath sounds bilaterally, no wheezing CARDIOVASCULAR: S1, S2 normal. No murmur   ABDOMEN: Soft, nontender, nondistended EXTREMITIES: No  edema b/l.    NEUROLOGIC: nonfocal  patient is alert and awake SKIN: No obvious rash, lesion, or ulcer.   LABORATORY PANEL:  CBC Recent Labs  Lab 09/21/22 0514  WBC 8.9  HGB 11.4*  HCT 35.5*  PLT 305    Chemistries  Recent Labs  Lab 09/22/22 0905  NA 133*  K 3.7  CL 102  CO2 22  GLUCOSE 134*  BUN 25*  CREATININE 1.05  CALCIUM 7.1*    Assessment and Plan 77 year old with chronic respiratory failure on 4 L home O2, A-fib on amiodarone and Eliquis s/p cardioversion, chronic hypotension likely secondary to autonomic dysfunction on midodrine, obesity via hypoventilation syndrome was admitted for decompensated HFpEF. He has diuresed well and feels better.   Cardiogenic shock ([place]) Cardiogenic shock ruled out, patient did had right heart catheterization which shows decreased filling pressures.  Cardiology does not think that his current presentation is due to any cardiac reason.     Orthostatic hypotension suspected Autonomic dysfunction Most likely autonomic dysfunction.  Patient also has severe hypoalbuminemia which can be contributory. -Counseled for high-protein diet -Continue with  midodrine 15 mg 3 times daily -TED hose and abdominal binder - cardiology thinks Most likely autonomic dysfunction, advises tertiary care center dysautonomia clinic f/u. - requests outpatient palliative care referral    Chronic diastolic CHF (congestive heart failure) ([place]) Patient admitted with concern of acute on chronic diastolic heart failure and volume overload.  Right heart cath with low filling pressures-none on 09/10/2022 Heart failure team signed off.   Persistent atrial fibrillation (HCC) -Continue amiodarone -Continue Eliquis   Chronic respiratory failure with hypoxia (HCC) Initial concern of acute on chronic respiratory failure secondary to fluid overload, now resolved and patient is back to baseline. -Continue with supplemental oxygen    Obesity hypoventilation syndrome (HCC) Obesity, Class III, BMI 40-49.9 (morbid obesity) ([place]) Estimated body mass index is 37.78 kg/m as calculated from the following:   Height as of this encounter: 6\' 1"  (1.854 m).   Weight as of this encounter: [id number] kg.  -This complicates overall prognosis -Continue supplemental oxygen   AKI (acute kidney injury) ([place]) Resolved.   Exocrine pancreatic deficiency Possible SIBO At gi visit last year noted to have exocrine pancreatic deficiency,  on creon   Generalized weakness -- patient's rehab has been denied by insurance. Wife has appealed. Awaiting appeal process. -- pt has expressed wishes to go home with hospice. Cannot afford private pay to rehab. -- Palliative care consultation   Family communication :none today Consults : cardiology CODE STATUS: DNR DVT Prophylaxis :eliquis Level of care: Med-Surg Status is: Inpatient Remains inpatient appropriate because: awaiting palliative care consultation and verdict of appeal process.  TOTAL TIME TAKING CARE OF THIS PATIENT: 25 minutes.  >50% time spent on counselling and coordination of care  Note: This dictation was prepared with Dragon  dictation along with smaller phrase technology. Any transcriptional errors that result from this process are unintentional.  Fritzi Mandes M.D    [facility]   CC: Primary care physician; Pcp, No

## 2022-09-23 NOTE — Progress Notes (Signed)
   09/23/22 0139  Assess: MEWS Score  Temp 97.6 F (36.4 C)  BP (!) 65/45  MAP (mmHg) (!) 53  Pulse Rate (!) 104  Resp 18  SpO2 98 %  O2 Device Room Air  Assess: MEWS Score  MEWS Temp 0  MEWS Systolic 3  MEWS Pulse 1  MEWS RR 0  MEWS LOC 0  MEWS Score 4  MEWS Score Color Red  Assess: if the MEWS score is Yellow or Red  Were vital signs taken at a resting state? Yes  Focused Assessment No change from prior assessment  Does the patient meet 2 or more of the SIRS criteria? No  Does the patient have a confirmed or suspected source of infection? No  Provider and Rapid Response Notified? No  MEWS guidelines implemented  Yes, red  Treat  MEWS Interventions Considered administering scheduled or prn medications/treatments as ordered  Take Vital Signs  Increase Vital Sign Frequency  Red: Q1hr x2, continue Q4hrs until patient remains green for 12hrs  Escalate  MEWS: Escalate Red: Discuss with charge nurse and notify provider. Consider notifying RRT. If remains red for 2 hours consider need for higher level of care  Notify: Charge Nurse/RN  Name of Charge Nurse/RN Notified [place]  Provider Notification  Provider Name/Title [place],NP  Date Provider Notified 09/23/22  Time Provider Notified 0155  Method of Notification  (secure chat)  Notification Reason  (red mews)  Provider response No new orders  Date of Provider Response 09/23/22  Time of Provider Response 0205  Assess: SIRS CRITERIA  SIRS Temperature  0  SIRS Pulse 1  SIRS Respirations  0  SIRS WBC 0  SIRS Score Sum  1   Patient has a history of hypotension, taking midodrin TID. Patient is asymptomatic. NP aware. Will continue to monitor for any changes.

## 2022-09-24 DIAGNOSIS — I509 Heart failure, unspecified: Secondary | ICD-10-CM | POA: Diagnosis not present

## 2022-09-24 DIAGNOSIS — G909 Disorder of the autonomic nervous system, unspecified: Secondary | ICD-10-CM | POA: Diagnosis not present

## 2022-09-24 DIAGNOSIS — I4891 Unspecified atrial fibrillation: Secondary | ICD-10-CM

## 2022-09-24 DIAGNOSIS — I951 Orthostatic hypotension: Secondary | ICD-10-CM | POA: Diagnosis not present

## 2022-09-24 DIAGNOSIS — N179 Acute kidney failure, unspecified: Secondary | ICD-10-CM

## 2022-09-24 DIAGNOSIS — Z7189 Other specified counseling: Secondary | ICD-10-CM

## 2022-09-24 MED ORDER — LORAZEPAM 2 MG/ML IJ SOLN
1.0000 mg | INTRAMUSCULAR | Status: DC | PRN
  Administered 2022-09-26: 1 mg via INTRAVENOUS
  Filled 2022-09-24: qty 1

## 2022-09-24 MED ORDER — ADULT MULTIVITAMIN W/MINERALS CH
1.0000 | ORAL_TABLET | Freq: Every morning | ORAL | Status: DC
  Filled 2022-09-24: qty 1

## 2022-09-24 MED ORDER — MORPHINE SULFATE (PF) 2 MG/ML IV SOLN
2.0000 mg | INTRAVENOUS | Status: DC | PRN
  Administered 2022-09-24 – 2022-09-25 (×5): 2 mg via INTRAVENOUS
  Filled 2022-09-24 (×5): qty 1

## 2022-09-24 MED ORDER — OYSTER SHELL CALCIUM/D3 500-5 MG-MCG PO TABS
2.0000 | ORAL_TABLET | Freq: Two times a day (BID) | ORAL | Status: DC
  Filled 2022-09-24: qty 2

## 2022-09-24 MED ORDER — BIOTENE DRY MOUTH MT LIQD: 15.0000 mL | OROMUCOSAL | Status: DC | PRN

## 2022-09-24 MED ORDER — POLYVINYL ALCOHOL 1.4 % OP SOLN: 1.0000 [drp] | Freq: Four times a day (QID) | OPHTHALMIC | Status: DC | PRN

## 2022-09-24 MED ORDER — LORAZEPAM 1 MG PO TABS: 1.0000 mg | ORAL_TABLET | ORAL | Status: DC | PRN

## 2022-09-24 MED ORDER — HALOPERIDOL LACTATE 2 MG/ML PO CONC: 0.5000 mg | ORAL | Status: DC | PRN

## 2022-09-24 MED ORDER — LORAZEPAM 2 MG/ML PO CONC
1.0000 mg | ORAL | Status: DC | PRN
  Administered 2022-09-25: 1 mg via SUBLINGUAL
  Filled 2022-09-24: qty 1

## 2022-09-24 MED ORDER — GLYCOPYRROLATE 0.2 MG/ML IJ SOLN: 0.2000 mg | INTRAMUSCULAR | Status: DC | PRN

## 2022-09-24 MED ORDER — HALOPERIDOL LACTATE 5 MG/ML IJ SOLN: 0.5000 mg | INTRAMUSCULAR | Status: DC | PRN

## 2022-09-24 MED ORDER — HALOPERIDOL 0.5 MG PO TABS: 0.5000 mg | ORAL_TABLET | ORAL | Status: DC | PRN

## 2022-09-24 MED ORDER — MORPHINE SULFATE (CONCENTRATE) 10 MG/0.5ML PO SOLN
5.0000 mg | ORAL | Status: DC | PRN
  Administered 2022-09-25 (×4): 5 mg via ORAL
  Filled 2022-09-24 (×4): qty 0.5

## 2022-09-24 MED ORDER — PANTOPRAZOLE SODIUM 40 MG PO TBEC: 40.0000 mg | DELAYED_RELEASE_TABLET | ORAL | Status: DC

## 2022-09-24 MED ORDER — AMIODARONE HCL 200 MG PO TABS
200.0000 mg | ORAL_TABLET | Freq: Every day | ORAL | Status: DC
  Filled 2022-09-24: qty 1

## 2022-09-24 MED ORDER — GLYCOPYRROLATE 1 MG PO TABS: 1.0000 mg | ORAL_TABLET | ORAL | Status: DC | PRN

## 2022-09-24 MED ORDER — FLUDROCORTISONE ACETATE 0.1 MG PO TABS
0.2000 mg | ORAL_TABLET | Freq: Every day | ORAL | Status: DC
  Filled 2022-09-24: qty 2

## 2022-09-24 MED ORDER — ONDANSETRON HCL 4 MG/2ML IJ SOLN
4.0000 mg | Freq: Four times a day (QID) | INTRAMUSCULAR | Status: DC | PRN
  Administered 2022-09-24 – 2022-09-25 (×2): 4 mg via INTRAVENOUS
  Filled 2022-09-24 (×2): qty 2

## 2022-09-24 MED ORDER — APIXABAN 5 MG PO TABS
5.0000 mg | ORAL_TABLET | Freq: Two times a day (BID) | ORAL | Status: DC
  Administered 2022-09-24 – 2022-09-25 (×3): 5 mg via ORAL
  Filled 2022-09-24 (×3): qty 1

## 2022-09-24 MED ORDER — AMIODARONE HCL 200 MG PO TABS: 200.0000 mg | ORAL_TABLET | Freq: Two times a day (BID) | ORAL | Status: DC

## 2022-09-24 NOTE — TOC Progression Note (Signed)
Transition of Care [facility]) - Progression Note    Patient Details  Name: Ernest [name] MRN: [id number] Date of Birth: [birth year]  Transition of Care [facility]) CM/SW [place], LCSW Phone Number: 09/24/2022, 4:06 PM  Clinical Narrative:  Plan for home hospice services. Wife confirmed he would need EMS transport home and address on facesheet is correct. Wife concerned about bringing patient home and being unable to care for him without assistance. Tried calling [facility] but unable to reach liaison. Made referral to [facility].   Expected Discharge Plan: Skilled Nursing Facility Barriers to Discharge: Insurance Authorization  Expected Discharge Plan and [place] arrangements for the past 2 months: Single Family Home                                       Social Determinants of Health (SDOH) Interventions SDOH Screenings   Food Insecurity: No Food Insecurity (09/08/2022)  Housing: Low Risk  (09/08/2022)  Transportation Needs: No Transportation Needs (09/08/2022)  Utilities: Not At Risk (09/08/2022)  Alcohol Screen: Low Risk  (06/21/2021)  Depression (PHQ2-9): Low Risk  (08/25/2022)  Financial Resource Strain: Low Risk  (06/21/2021)  Physical Activity: Inactive (06/21/2021)  Social Connections: Unknown (06/21/2021)  Stress: No Stress Concern Present (06/21/2021)  Tobacco Use: Low Risk  (09/11/2022)    Readmission Risk Interventions     No data to display

## 2022-09-24 NOTE — Progress Notes (Signed)
[facility] [place] [facility] [place] NAME: Ernest [name]    MR#:  [id number]  DATE OF BIRTH:  May 27, 1946  SUBJECTIVE:   Met with wife in the room. Patient intermittently getting a bit emotional. He tells me his son mine and now he is ready for comfort care. Answered all questions appropriately. Patient and wife spoke with palliative nurse practitioner earlier. VITALS:  Blood pressure (!) 71/47, pulse 99, temperature (!) 96.7 F (35.9 C), resp. rate 19, height 6\' 1"  (1.854 m), weight 129.6 kg, SpO2 100 %.  PHYSICAL EXAMINATION:   GENERAL:  77 y.o.-year-old patient with no acute distress. obese, weak deconditioned LUNGS: Normal breath sounds bilaterally, no wheezing CARDIOVASCULAR: S1, S2 normal. No murmur   ABDOMEN: Soft, nontender, nondistended EXTREMITIES: No  edema b/l.    NEUROLOGIC: nonfocal  patient is alert and awake   LABORATORY PANEL:  CBC Recent Labs  Lab 09/21/22 0514  WBC 8.9  HGB 11.4*  HCT 35.5*  PLT 305     Chemistries  Recent Labs  Lab 09/22/22 0905  NA 133*  K 3.7  CL 102  CO2 22  GLUCOSE 134*  BUN 25*  CREATININE 1.05  CALCIUM 7.1*     Assessment and Plan 77 year old with chronic respiratory failure on 4 L home O2, A-fib on amiodarone and Eliquis s/p cardioversion, chronic hypotension likely secondary to autonomic dysfunction on midodrine, obesity via hypoventilation syndrome was admitted for decompensated HFpEF. He has diuresed well and feels better.   Cardiogenic shock ([place]) Cardiogenic shock ruled out, patient did had right heart catheterization which shows decreased filling pressures.  Cardiology does not think that his current presentation is due to any cardiac reason.     Chronic orthostatic hypotension suspected Autonomic dysfunction Most likely autonomic dysfunction.  Patient also has severe hypoalbuminemia which can be contributory.   Chronic diastolic CHF (congestive heart failure) ([place]) Patient  admitted with concern of acute on chronic diastolic heart failure and volume overload.  Right heart cath with low filling pressures-none on 09/10/2022   Persistent atrial fibrillation (HCC) -Continue Eliquis   Chronic respiratory failure with hypoxia (HCC)    Obesity hypoventilation syndrome (HCC) Obesity, Class III, BMI 40-49.9 (morbid obesity) ([place]) -This complicates overall prognosis   AKI (acute kidney injury) ([place]) Resolved.   Exocrine pancreatic deficiency Possible SIBO At gi visit last year noted to have exocrine pancreatic deficiency,  on creon   Generalized weakness/deconditioning  Patient and wife had a long discussion with palliative care. They understand overall poor prognosis. Patient does not want to go to rehab. Wants to consider for comfort care and hospice. Patient is DNR DNI. Hospice liaison to meet with patient and wife.  Family communication : wife at bedside Consults : cardiology, palliative care CODE STATUS: DNR DVT Prophylaxis :eliquis Level of care: Palliative Care Status is: Inpatient  TOTAL TIME TAKING CARE OF THIS PATIENT: 35 minutes.  >50% time spent on counselling and coordination of care  Note: This dictation was prepared with Dragon dictation along with smaller phrase technology. Any transcriptional errors that result from this process are unintentional.  Fritzi Mandes M.D    [facility]   CC: Primary care physician; Pcp, No

## 2022-09-24 NOTE — Consult Note (Signed)
Consultation Note Date: 09/24/2022   Patient Name: Ernest [name]  DOB: 1946/04/30  MRN: [id number]  Age / Sex: 77 y.o., male  PCP: Pcp, No Referring Physician: Fritzi Mandes, MD  Reason for Consultation:  goals of care "patient told RN he wanted to transition to comfort care"  HPI/Patient Profile: 77 y.o. male  with past medical history of heart failure with reduced EF, chronic venous insufficiency, GERD, hypoventilation syndrome, and chronic respiratory failure on home O2 4L, HTN, a-fib, chronic hypotension, SIBO admitted on 09/08/2022 with dizziness and SOB at rest. He was admitted and treatment began for decompensated heart failure, orthostatic hypotension due to autonomic dysfunction and chronic a-fib. He continues to have extreme hypotension despite midodrine- BP down to 63/47 when he attempted to stand with PT. Palliative consulted for [place].   Primary Decision Maker PATIENT  Discussion: Chart reviewed including labs, progress notes, imaging from this and previous encounters.  Met with patient.  He is visibly short of breath while lying. He is unable to stand due to his hypotension. Bedbound requiring total care.  He tells me his illness has progressed quickly and he is very weak. He does not have an appetite- is not eating or drinking- urine is very dark. He very clearly states that his primary goal is to stop taking all medications except medications for comfort and be allowed to die in his home.  I reviewed hospice services and philosophy of care with him.  He shares that his spouse "doesn't want to be home alone" with him. They have friends who can help, but not during the day.   I met with patient and spouse on return visit.  We discussed transition to full comfort measures which includes stopping IV fluids, antibiotics, labs and providing symptom management for SOB, anxiety, nausea, vomiting, and  other symptoms of dying.  Disposition was also discussed- hospice services and philosophy of care reviewed.  Patient and family would like to speak with hospice and be evaluated for possible inpatient hospice vs hospice services at home.     SUMMARY OF RECOMMENDATIONS -Transition to full comfort measures only -Morphine 5mg  sublingual or morphine 2mg  IV q1hr prn for SOB, discomfort -lorazepam 1mg  IV or sublingual q4 hrs prn for anxiety - robinul 0.2mg  q4hr prn excessive secretions -He would like to continue to take his eliquis as long as he can -Continue creon as long as he is eating -Continue protonix -Refer to hospice for evaluation  Code Status/Advance Care Planning: DNR   Prognosis:   Would not be surprised if he had a matter of weeks, this was discussed with patient and family  Discharge Planning: To Be Determined  Primary Diagnoses: Present on Admission:  Cardiogenic shock  Obesity hypoventilation syndrome  Chronic diastolic CHF (congestive heart failure)  Persistent atrial fibrillation  Orthostatic hypotension  Leukocytosis  Obesity, Class III, BMI 40-49.9 (morbid obesity)  Chronic respiratory failure with hypoxia  Atrial fibrillation   Review of Systems  Constitutional:  Positive for activity change and appetite change.  Respiratory:  Positive for shortness of breath.     Physical Exam Vitals and nursing note reviewed.  Pulmonary:     Effort: Respiratory distress present.  Abdominal:     General: There is distension.  Skin:    Coloration: Skin is pale.  Neurological:     Mental Status: He is alert and oriented to person, place, and time.     Vital Signs: BP (!) 64/47   Pulse (!) 107   Temp (!) 96.7 F (35.9 C)   Resp 19   Ht 6\' 1"  (1.854 m)   Wt 129.6 kg   SpO2 100%   BMI 37.70 kg/m  Pain Scale: 0-10 POSS *See Group Information*: 1-Acceptable,Awake and alert Pain Score: 0-No pain   SpO2: SpO2: 100 % O2 Device:SpO2: 100 % O2 Flow Rate: .O2  Flow Rate (L/min): 4 L/min  IO: Intake/output summary:  Intake/Output Summary (Last 24 hours) at 09/24/2022 1012 Last data filed at 09/24/2022 0100 Gross per 24 hour  Intake 400 ml  Output 200 ml  Net 200 ml    LBM: Last BM Date : 09/23/22 Baseline Weight: Weight: 134.5 kg Most recent weight: Weight: 129.6 kg       Thank you for this consult. Palliative medicine will continue to follow and assist as needed.  Time Total: 120 minutes Greater than 50%  of this time was spent counseling and coordinating care related to the above assessment and plan.  Signed by: Mariana Kaufman, AGNP-C Palliative Medicine    Please contact Palliative Medicine Team phone at [phone] for questions and concerns.  For individual provider: See Shea Evans

## 2022-09-24 NOTE — Progress Notes (Signed)
[facility]) Hospital Liaison Note  Received request from PMT provider/K. Juanda Crumble, NP for hospice services at home after discharge or [place] pending family decision.Transitions of Care Manager, Ernest Roch, aware via [employer]. Chart and patient information under review by [facility] physician.   Spoke with patient & spouse/Ernest [name] to initiate education related to hospice philosophy, services, and team approach to care. Ernest [name] verbalized understanding of information given. Per discussion, the plan is for patient to discharge home via EMS once cleared to DC.    DME needs discussed. Patient has the following equipment in the home: O2 @ 4L ([place]) [facility] Patient requests the following equipment for delivery: [facility]  Address verified and is correct in the chart. Ernest [name] is the family member to contact to arrange time of equipment delivery.   Ernest [name] reports to MSW that she will need to have caregivers in place prior to DC as she cannot care for patient independently. Emotional support provided to both patient & spouse. Ernest [name] IDT notified via Epic chat of family plan to seek additional support in the home prior to DC.    Please send signed and completed DNR home with patient/family. Please provide prescriptions at discharge as needed to ensure ongoing symptom management.    [facility] information and contact numbers given to family & above information shared with TOC.   Please call with any questions/concerns.    Thank you for the opportunity to participate in this patient's care.   Phillis Haggis, MSW [place] Hospital Liaison  [phone]

## 2022-09-24 NOTE — Progress Notes (Signed)
PT Cancellation Note  Patient Details Name: TILL LOUPE MRN: [id number] DOB: 06-05-1946   Cancelled Treatment:    Reason Eval/Treat Not Completed: Other (comment): Pt stated and confirmed that he no longer wishes to receive PT/rehab services going forward.  Will complete PT orders at this time but will reassess pt pending a change in status upon receipt of new PT orders.    Linus Salmons PT, DPT 09/24/22, 10:19 AM

## 2022-09-25 ENCOUNTER — Inpatient Hospital Stay: Payer: Medicare Other | Admitting: Pulmonary Disease

## 2022-09-25 DIAGNOSIS — I4891 Unspecified atrial fibrillation: Secondary | ICD-10-CM | POA: Diagnosis not present

## 2022-09-25 DIAGNOSIS — Z7189 Other specified counseling: Secondary | ICD-10-CM | POA: Diagnosis not present

## 2022-09-25 DIAGNOSIS — I951 Orthostatic hypotension: Secondary | ICD-10-CM | POA: Diagnosis not present

## 2022-09-25 DIAGNOSIS — I509 Heart failure, unspecified: Secondary | ICD-10-CM | POA: Diagnosis not present

## 2022-09-25 MED ORDER — LORAZEPAM 1 MG PO TABS: 1.0000 mg | ORAL_TABLET | ORAL | 0 refills | Status: DC | PRN

## 2022-09-25 MED ORDER — MORPHINE SULFATE (CONCENTRATE) 10 MG/0.5ML PO SOLN: 5.0000 mg | ORAL | 0 refills | Status: DC | PRN

## 2022-09-25 MED ORDER — POLYVINYL ALCOHOL 1.4 % OP SOLN: 1.0000 [drp] | Freq: Four times a day (QID) | OPHTHALMIC | 0 refills | Status: DC | PRN

## 2022-09-25 MED ORDER — PANCRELIPASE (LIP-PROT-AMYL) 36000-114000 UNITS PO CPEP: 36000.0000 [IU] | ORAL_CAPSULE | Freq: Three times a day (TID) | ORAL | 0 refills | Status: DC

## 2022-09-25 NOTE — Progress Notes (Signed)
[facility] ([facility])   Plan is for patient to be discharged via EMS once family able to arrange additional support in the home. MSW spoke with spouse on multiple occasions to explain hospice support offered, the inability to provide 24/7 care & medication management. Spouse/Judith very appreciative of MSW calls and reports that she actively seeking support in the home.   If applicable, please send signed and completed DNR with patient/family upon discharge. Please provide prescriptions at discharge as needed to ensure ongoing symptom management and a transport packet.   [facility] information and contact numbers given to family and above information shared with TOC.    Please call with any questions/concerns.    Thank you for the opportunity to participate in this patient's care   Phillis Haggis, MSW [facility] Liaison  [phone]

## 2022-09-25 NOTE — Progress Notes (Signed)
Daily Progress Note   Patient Name: Ernest [name]       Date: 09/25/2022 DOB: Aug 05, 1945  Age: 77 y.o. MRN#: [id number] Attending Physician: Fritzi Mandes, MD Primary Care Physician: Pcp, No Admit Date: 09/08/2022  Reason for Consultation/Follow-up: Establishing goals of care  Patient Profile/HPI:  77 y.o. male  with past medical history of heart failure with reduced EF, chronic venous insufficiency, GERD, hypoventilation syndrome, and chronic respiratory failure on home O2 4L, HTN, a-fib, chronic hypotension, SIBO admitted on 09/08/2022 with dizziness and SOB at rest. He was admitted and treatment began for decompensated heart failure, orthostatic hypotension due to autonomic dysfunction and chronic a-fib. He continues to have extreme hypotension despite midodrine- BP down to 63/47 when he attempted to stand with PT. Palliative consulted for [place].   Subjective: Chart reviewed including labs, progress notes, imaging from this and previous encounters.  Ernest [name] is awake, alert. Very eager to go home.  Symptoms are currently managed.  Hospice has been arranged and spouse is working on Environmental manager.   Review of Systems  Constitutional:  Positive for malaise/fatigue.     Physical Exam Vitals and nursing note reviewed.  Constitutional:      General: He is not in acute distress. Neurological:     Mental Status: He is alert and oriented to person, place, and time.             Vital Signs: BP (!) 62/45 (BP Location: Right Arm)   Pulse 94   Temp 98 F (36.7 C)   Resp 18   Ht 6\' 1"  (1.854 m)   Wt 129.6 kg   SpO2 100%   BMI 37.70 kg/m  SpO2: SpO2: 100 % O2 Device: O2 Device: Nasal Cannula O2 Flow Rate: O2 Flow Rate (L/min): 4 L/min  Intake/output summary:  Intake/Output Summary  (Last 24 hours) at 09/25/2022 1617 Last data filed at 09/25/2022 0949 Gross per 24 hour  Intake --  Output 302 ml  Net -302 ml   LBM: Last BM Date : 09/25/22 Baseline Weight: Weight: 134.5 kg Most recent weight: Weight: 129.6 kg       Palliative Assessment/Data: PPS: 30%      Patient Active Problem List   Diagnosis Date Noted   Acute on chronic congestive heart failure 09/23/2022   Autonomic dysfunction 09/17/2022  AKI (acute kidney injury) 09/14/2022   Cardiogenic shock 09/08/2022   Fall 08/17/2022   Orthostatic hypotension 08/17/2022   Leukocytosis 08/17/2022   Chronic diastolic CHF (congestive heart failure) 08/17/2022   Near syncope 07/26/2022   Diarrhea 07/26/2022   Chronic anticoagulation 07/26/2022   Chronic venous stasis 07/26/2022   Hypokalemia 07/26/2022   Hypotension 07/26/2022   Hypomagnesemia 07/26/2022   Persistent atrial fibrillation 07/14/2022   Atrial fibrillation with rapid ventricular response 06/27/2022   Hypercoagulable state due to persistent atrial fibrillation 06/27/2022   Chronic right-sided heart failure 06/27/2022   Obesity hypoventilation syndrome 06/27/2022   Essential hypertension 06/27/2022   Chronic respiratory failure with hypoxia 05/22/2022   OSA (obstructive sleep apnea) 05/22/2022   History of colonic polyps    Polyp of transverse colon    Adenomatous polyp of cecum    Partial small bowel obstruction 04/01/2021   Abdominal bloating 11/30/2020   Abdominal pain, epigastric    Symptomatic anemia    GERD (gastroesophageal reflux disease)    Swollen lymph nodes 07/25/2020   Atrial fibrillation 06/25/2020   Obesity, Class III, BMI 40-49.9 (morbid obesity) 06/25/2020   History of umbilical hernia repair [id number]    Palliative Care Assessment & Plan    Assessment/Recommendations/Plan  Continue current comfort measures D/C home with Hospice when arrangements finalized   Code Status: DNR  Prognosis:  < 6  months  Discharge Planning: Home with Hospice  Care plan was discussed with patient and care team  Thank you for allowing the Palliative Medicine Team to assist in the care of this patient.  Total time:  30 minutes  Greater than 50%  of this time was spent counseling and coordinating care related to the above assessment and plan.  Mariana Kaufman, AGNP-C Palliative Medicine   Please contact Palliative Medicine Team phone at [phone] for questions and concerns.

## 2022-09-25 NOTE — Discharge Summary (Signed)
Physician Discharge Summary   Patient: Ernest [name] MRN: [id number] DOB: 1945/09/22  Admit date:     09/08/2022  Discharge date: 09/25/22  Discharge Physician: Fritzi Mandes   PCP: Pcp, No   Recommendations at discharge:    F/u pcp as needed Pt to be follwoed by Hospice at home  Discharge Diagnoses: Principal Problem:   Orthostatic hypotension Active Problems:   Cardiogenic shock   Chronic diastolic CHF (congestive heart failure)   Persistent atrial fibrillation   Chronic respiratory failure with hypoxia   Leukocytosis   Obesity, Class III, BMI 40-49.9 (morbid obesity)   Obesity hypoventilation syndrome   AKI (acute kidney injury)   Atrial fibrillation   Autonomic dysfunction   Acute on chronic congestive heart failure  77 year old with chronic respiratory failure on 4 L home O2, A-fib on amiodarone and Eliquis s/p cardioversion, chronic hypotension likely secondary to autonomic dysfunction on midodrine, obesity via hypoventilation syndrome was admitted for decompensated HFpEF. He has diuresed well and feels better.    Cardiogenic shock ([place]) Cardiogenic shock ruled out, patient did had right heart catheterization which shows decreased filling pressures.  Cardiology does not think that his current presentation is due to any cardiac reason.     Orthostatic hypotension suspected Autonomic dysfunction Most likely autonomic dysfunction.  Patient also has severe hypoalbuminemia which can be contributory. -TED hose and abdominal binder - cardiology thinks Most likely autonomic dysfunction, advises tertiary care center dysautonomia clinic f/u. - requests outpatient palliative care referral --pt is requesting hospice at his home   Chronic diastolic CHF (congestive heart failure) [facility]) Patient admitted with concern of acute on chronic diastolic heart failure and volume overload.  Right heart cath with low filling pressures-none on 09/10/2022 Heart failure team signed off.    Persistent atrial fibrillation (HCC) -Continue Eliquis   Chronic respiratory failure with hypoxia (HCC) Initial concern of acute on chronic respiratory failure secondary to fluid overload, now resolved and patient is back to baseline.    Obesity hypoventilation syndrome (HCC) Obesity, Class III, BMI 40-49.9 (morbid obesity) ([place]) Estimated body mass index is 37.78 kg/m as calculated from the following:   Height as of this encounter: 6\' 1"  (1.854 m).   Weight as of this encounter: [id number] kg.  -This complicates overall prognosis -Continue supplemental oxygen   AKI (acute kidney injury) ([place]) Resolved.   Exocrine pancreatic deficiency Possible SIBO At gi visit last year noted to have exocrine pancreatic deficiency,  on creon    Generalized weakness -- pt has expressed wishes to go home with hospice.  -- Palliative care consultation appreciated--pt is COMFORT CARE now   D/c home later today once bed is delivered Family communication :wife 4/3 Consults : cardiology CODE STATUS: DNR DVT Prophylaxis :eliquis    Pain control - [place] Controlled Substance Reporting System database was reviewed. and patient was instructed, not to drive, operate heavy machinery, perform activities at heights, swimming or participation in water activities or provide baby-sitting services while on Pain, Sleep and Anxiety Medications; until their outpatient Physician has advised to do so again. Also recommended to not to take more than prescribed Pain, Sleep and Anxiety Medications.  Disposition: Home with hopice Diet recommendation:  Discharge Diet Orders (From admission, onward)     Start     Ordered   09/25/22 0000  Diet - low sodium heart healthy        09/25/22 1007           Cardiac diet DISCHARGE MEDICATION: Allergies  as of 09/25/2022   No Known Allergies      Medication List     STOP taking these medications    amiodarone 200 MG tablet Commonly known as: PACERONE    calcium-vitamin D 500-5 MG-MCG tablet Commonly known as: OSCAL WITH D   CENTRUM SILVER 50+MEN PO   cyanocobalamin 1000 MCG tablet Commonly known as: VITAMIN B12   fludrocortisone 0.1 MG tablet Commonly known as: FLORINEF   furosemide 40 MG tablet Commonly known as: LASIX   midodrine 10 MG tablet Commonly known as: PROAMATINE   nystatin powder Commonly known as: MYCOSTATIN/NYSTOP   potassium chloride SA 20 MEQ tablet Commonly known as: KLOR-CON M   traMADol 50 MG tablet Commonly known as: ULTRAM       TAKE these medications    acetaminophen 325 MG tablet Commonly known as: TYLENOL Take 325 mg by mouth every 8 (eight) hours as needed for moderate pain.   apixaban 5 MG Tabs tablet Commonly known as: ELIQUIS Take 1 tablet (5 mg total) by mouth 2 (two) times daily.   dicyclomine 10 MG capsule Commonly known as: BENTYL Take 1 capsule (10 mg total) by mouth 3 (three) times daily as needed for spasms.   lipase/protease/amylase 36000 UNITS Cpep capsule Commonly known as: CREON Take 1 capsule (36,000 Units total) by mouth 3 (three) times daily before meals.   loperamide 2 MG capsule Commonly known as: IMODIUM Take 1 capsule (2 mg total) by mouth as needed for diarrhea or loose stools.   LORazepam 1 MG tablet Commonly known as: ATIVAN Take 1 tablet (1 mg total) by mouth every 4 (four) hours as needed for anxiety.   morphine CONCENTRATE 10 MG/0.5ML Soln concentrated solution Take 0.25 mLs (5 mg total) by mouth every hour as needed for moderate pain, severe pain, anxiety or shortness of breath.   pantoprazole 40 MG tablet Commonly known as: PROTONIX Take 1 tablet (40 mg total) by mouth 3 (three) times a week. Takes 40 mg po Monday, Wednesday, Friday   polyvinyl alcohol 1.4 % ophthalmic solution Commonly known as: LIQUIFILM TEARS Place 1 drop into both eyes 4 (four) times daily as needed for dry eyes.               Durable Medical Equipment  (From  admission, onward)           Start     Ordered   09/24/22 1437  For home use only DME Hospital bed  Once       Question Answer Comment  Length of Need Lifetime   The above medical condition requires: Patient requires the ability to reposition frequently   Bed type Semi-electric   [facility] Lift Yes   Trapeze Bar Yes   Support Surface: Gel Overlay      09/24/22 1437            Contact information for after-discharge care     Destination     [facility] .   Service: Skilled Nursing Contact information: [address] [place] [phone]                    Discharge Exam: Danley Danker Weights   09/21/22 [id number] 09/23/22 0500 09/24/22 0500  Weight: 128.8 kg 129.6 kg 129.6 kg     Condition at discharge: poor  The results of significant diagnostics from this hospitalization (including imaging, microbiology, ancillary and laboratory) are listed below for reference.   Imaging Studies:  CARDIAC CATHETERIZATION  Result Date: 09/10/2022 HEMODYNAMICS: RA:   3 mmHg (mean) RV:   13/1-3 mmHg PA:   15/5 mmHg (10 mean) PCWP:  4 mmHg (mean)    Estimated Fick CO/CI   5.6L/min, 2.2 L/min/m2    TPG    6  mmHg     PVR     1.1 Wood Units PAPi      3.3  IMPRESSION: Low pre and post capillary filling pressures Low normal cardiac output / cardiac index Normal PVR Aditya Sabharwal Advanced Heart Failure 9:32 AM   DG Chest Port 1 View  Result Date: 09/08/2022 CLINICAL DATA:  Shortness of breath EXAM: PORTABLE CHEST 1 VIEW COMPARISON:  X-ray 08/17/2022 FINDINGS: Enlarged cardiopericardial silhouette with calcified and tortuous aorta. Central vascular congestion. Tiny pleural effusions. No pneumothorax or edema. Overlapping cardiac leads. Curvature and degenerative changes along the spine. Underinflation. Film is under penetrated IMPRESSION: Enlarged cardiopericardial silhouette with vascular congestion and tiny effusions. The inferior  right costophrenic angle is clipped off the edge of the film Electronically Signed   By: Jill Side M.D.   On: 09/08/2022 12:30    Microbiology: Results for orders placed or performed during the hospital encounter of 09/08/22  MRSA Next Gen by PCR, Nasal     Status: None   Collection Time: 09/08/22  4:46 PM   Specimen: Nasal Mucosa; Nasal Swab  Result Value Ref Range Status   MRSA by PCR Next Gen NOT DETECTED NOT DETECTED Final    Comment: (NOTE) The GeneXpert MRSA Assay (FDA approved for NASAL specimens only), is one component of a comprehensive MRSA colonization surveillance program. It is not intended to diagnose MRSA infection nor to guide or monitor treatment for MRSA infections. Test performance is not FDA approved in patients less than 64 years old. Performed at [facility], [address] [place],  [address]   Resp panel by RT-PCR (RSV, Flu A&B, Covid)     Status: None   Collection Time: 09/08/22  4:56 PM  Result Value Ref Range Status   SARS Coronavirus 2 by RT PCR NEGATIVE NEGATIVE Final    Comment: (NOTE) SARS-CoV-2 target nucleic acids are NOT DETECTED.  The SARS-CoV-2 RNA is generally detectable in upper respiratory specimens during the acute phase of infection. The lowest concentration of SARS-CoV-2 viral copies this assay can detect is 138 copies/mL. A negative result does not preclude SARS-Cov-2 infection and should not be used as the sole basis for treatment or other patient management decisions. A negative result may occur with  improper specimen collection/handling, submission of specimen other than nasopharyngeal swab, presence of viral mutation(s) within the areas targeted by this assay, and inadequate number of viral copies(<138 copies/mL). A negative result must be combined with clinical observations, patient history, and epidemiological information. The expected result is Negative.  Fact Sheet for Patients:   EntrepreneurPulse.com.au  Fact Sheet for Healthcare Providers:  IncredibleEmployment.be  This test is no t yet approved or cleared by the [place] FDA and  has been authorized for detection and/or diagnosis of SARS-CoV-2 by FDA under an Emergency Use Authorization (EUA). This EUA will remain  in effect (meaning this test can be used) for the duration of the COVID-19 declaration under Section 564(b)(1) of the Act, 21 U.S.C.section 360bbb-3(b)(1), unless the authorization is terminated  or revoked sooner.       Influenza A by PCR NEGATIVE NEGATIVE Final   Influenza B by PCR NEGATIVE NEGATIVE Final    Comment: (NOTE) The  Xpert Xpress SARS-CoV-2/FLU/RSV plus assay is intended as an aid in the diagnosis of influenza from Nasopharyngeal swab specimens and should not be used as a sole basis for treatment. Nasal washings and aspirates are unacceptable for Xpert Xpress SARS-CoV-2/FLU/RSV testing.  Fact Sheet for Patients: EntrepreneurPulse.com.au  Fact Sheet for Healthcare Providers: IncredibleEmployment.be  This test is not yet approved or cleared by the [place] FDA and has been authorized for detection and/or diagnosis of SARS-CoV-2 by FDA under an Emergency Use Authorization (EUA). This EUA will remain in effect (meaning this test can be used) for the duration of the COVID-19 declaration under Section 564(b)(1) of the Act, 21 U.S.C. section 360bbb-3(b)(1), unless the authorization is terminated or revoked.     Resp Syncytial Virus by PCR NEGATIVE NEGATIVE Final    Comment: (NOTE) Fact Sheet for Patients: EntrepreneurPulse.com.au  Fact Sheet for Healthcare Providers: IncredibleEmployment.be  This test is not yet approved or cleared by the [place] FDA and has been authorized for detection and/or diagnosis of SARS-CoV-2 by FDA under an Emergency Use  Authorization (EUA). This EUA will remain in effect (meaning this test can be used) for the duration of the COVID-19 declaration under Section 564(b)(1) of the Act, 21 U.S.C. section 360bbb-3(b)(1), unless the authorization is terminated or revoked.  Performed at [facility], [place], [place] [address]   Respiratory (~20 pathogens) panel by PCR     Status: None   Collection Time: 09/08/22  4:56 PM  Result Value Ref Range Status   Adenovirus NOT DETECTED NOT DETECTED Final   Coronavirus 229E NOT DETECTED NOT DETECTED Final    Comment: (NOTE) The Coronavirus on the Respiratory Panel, DOES NOT test for the novel  Coronavirus (2019 nCoV)    Coronavirus HKU1 NOT DETECTED NOT DETECTED Final   Coronavirus NL63 NOT DETECTED NOT DETECTED Final   Coronavirus OC43 NOT DETECTED NOT DETECTED Final   Metapneumovirus NOT DETECTED NOT DETECTED Final   Rhinovirus / Enterovirus NOT DETECTED NOT DETECTED Final   Influenza A NOT DETECTED NOT DETECTED Final   Influenza B NOT DETECTED NOT DETECTED Final   Parainfluenza Virus 1 NOT DETECTED NOT DETECTED Final   Parainfluenza Virus 2 NOT DETECTED NOT DETECTED Final   Parainfluenza Virus 3 NOT DETECTED NOT DETECTED Final   Parainfluenza Virus 4 NOT DETECTED NOT DETECTED Final   Respiratory Syncytial Virus NOT DETECTED NOT DETECTED Final   Bordetella pertussis NOT DETECTED NOT DETECTED Final   Bordetella Parapertussis NOT DETECTED NOT DETECTED Final   Chlamydophila pneumoniae NOT DETECTED NOT DETECTED Final   Mycoplasma pneumoniae NOT DETECTED NOT DETECTED Final    Comment: Performed at [facility], [place] [address], [place], [place] [address]    Labs: CBC: Recent Labs  Lab 09/21/22 0514  WBC 8.9  HGB 11.4*  HCT 35.5*  MCV 85.1  PLT 123456   Basic Metabolic Panel: Recent Labs  Lab 09/22/22 0905  NA 133*  K 3.7  CL 102  CO2 22  GLUCOSE 134*  BUN 25*  CREATININE 1.05  CALCIUM 7.1*    Discharge time  spent: greater than 30 minutes.  Signed: Fritzi Mandes, MD [facility] 09/25/2022

## 2022-09-25 NOTE — Plan of Care (Signed)
Problem: Education: Goal: Understanding of CV disease, CV risk reduction, and recovery process will improve 09/25/2022 1055 by Rogelia Rohrer, LPN Outcome: Adequate for Discharge 09/25/2022 1055 by Rogelia Rohrer, LPN Outcome: Progressing Goal: Individualized Educational Video(s) 09/25/2022 1055 by Rogelia Rohrer, LPN Outcome: Adequate for Discharge 09/25/2022 1055 by Rogelia Rohrer, LPN Outcome: Progressing   Problem: Activity: Goal: Ability to return to baseline activity level will improve 09/25/2022 1055 by Rogelia Rohrer, LPN Outcome: Adequate for Discharge 09/25/2022 1055 by Rogelia Rohrer, LPN Outcome: Progressing   Problem: Cardiovascular: Goal: Ability to achieve and maintain adequate cardiovascular perfusion will improve 09/25/2022 1055 by Rogelia Rohrer, LPN Outcome: Adequate for Discharge 09/25/2022 1055 by Rogelia Rohrer, LPN Outcome: Progressing Goal: Vascular access site(s) Level 0-1 will be maintained 09/25/2022 1055 by Rogelia Rohrer, LPN Outcome: Adequate for Discharge 09/25/2022 1055 by Rogelia Rohrer, LPN Outcome: Progressing   Problem: Health Behavior/Discharge Planning: Goal: Ability to safely manage health-related needs after discharge will improve 09/25/2022 1055 by Rogelia Rohrer, LPN Outcome: Adequate for Discharge 09/25/2022 1055 by Rogelia Rohrer, LPN Outcome: Progressing   Problem: Education: Goal: Knowledge of General Education information will improve Description: Including pain rating scale, medication(s)/side effects and non-pharmacologic comfort measures 09/25/2022 1055 by Rogelia Rohrer, LPN Outcome: Adequate for Discharge 09/25/2022 1055 by Rogelia Rohrer, LPN Outcome: Progressing   Problem: Health Behavior/Discharge Planning: Goal: Ability to manage health-related needs will improve 09/25/2022 1055 by Rogelia Rohrer, LPN Outcome: Adequate for Discharge 09/25/2022 1055 by Rogelia Rohrer, LPN Outcome: Progressing    Problem: Clinical Measurements: Goal: Ability to maintain clinical measurements within normal limits will improve 09/25/2022 1055 by Rogelia Rohrer, LPN Outcome: Adequate for Discharge 09/25/2022 1055 by Rogelia Rohrer, LPN Outcome: Progressing Goal: Will remain free from infection 09/25/2022 1055 by Rogelia Rohrer, LPN Outcome: Adequate for Discharge 09/25/2022 1055 by Rogelia Rohrer, LPN Outcome: Progressing Goal: Diagnostic test results will improve 09/25/2022 1055 by Rogelia Rohrer, LPN Outcome: Adequate for Discharge 09/25/2022 1055 by Rogelia Rohrer, LPN Outcome: Progressing Goal: Respiratory complications will improve 09/25/2022 1055 by Rogelia Rohrer, LPN Outcome: Adequate for Discharge 09/25/2022 1055 by Rogelia Rohrer, LPN Outcome: Progressing Goal: Cardiovascular complication will be avoided 09/25/2022 1055 by Rogelia Rohrer, LPN Outcome: Adequate for Discharge 09/25/2022 1055 by Rogelia Rohrer, LPN Outcome: Progressing   Problem: Activity: Goal: Risk for activity intolerance will decrease 09/25/2022 1055 by Rogelia Rohrer, LPN Outcome: Adequate for Discharge 09/25/2022 1055 by Rogelia Rohrer, LPN Outcome: Progressing   Problem: Nutrition: Goal: Adequate nutrition will be maintained 09/25/2022 1055 by Rogelia Rohrer, LPN Outcome: Adequate for Discharge 09/25/2022 1055 by Rogelia Rohrer, LPN Outcome: Progressing   Problem: Coping: Goal: Level of anxiety will decrease 09/25/2022 1055 by Rogelia Rohrer, LPN Outcome: Adequate for Discharge 09/25/2022 1055 by Rogelia Rohrer, LPN Outcome: Progressing   Problem: Elimination: Goal: Will not experience complications related to bowel motility 09/25/2022 1055 by Rogelia Rohrer, LPN Outcome: Adequate for Discharge 09/25/2022 1055 by Rogelia Rohrer, LPN Outcome: Progressing Goal: Will not experience complications related to urinary retention 09/25/2022 1055 by Rogelia Rohrer, LPN Outcome:  Adequate for Discharge 09/25/2022 1055 by Rogelia Rohrer, LPN Outcome: Progressing   Problem: Pain Managment: Goal: General experience of comfort will improve 09/25/2022 1055 by Rogelia Rohrer, LPN Outcome: Adequate for Discharge 09/25/2022 1055 by Rogelia Rohrer, LPN Outcome: Progressing   Problem:  Safety: Goal: Ability to remain free from injury will improve 09/25/2022 1055 by Rogelia Rohrer, LPN Outcome: Adequate for Discharge 09/25/2022 1055 by Rogelia Rohrer, LPN Outcome: Progressing   Problem: Skin Integrity: Goal: Risk for impaired skin integrity will decrease 09/25/2022 1055 by Rogelia Rohrer, LPN Outcome: Adequate for Discharge 09/25/2022 1055 by Rogelia Rohrer, LPN Outcome: Progressing   Problem: Education: Goal: Knowledge of the prescribed therapeutic regimen will improve 09/25/2022 1055 by Rogelia Rohrer, LPN Outcome: Adequate for Discharge 09/25/2022 1055 by Rogelia Rohrer, LPN Outcome: Progressing   Problem: Coping: Goal: Ability to identify and develop effective coping behavior will improve 09/25/2022 1055 by Rogelia Rohrer, LPN Outcome: Adequate for Discharge 09/25/2022 1055 by Rogelia Rohrer, LPN Outcome: Progressing   Problem: Clinical Measurements: Goal: Quality of life will improve 09/25/2022 1055 by Rogelia Rohrer, LPN Outcome: Adequate for Discharge 09/25/2022 1055 by Rogelia Rohrer, LPN Outcome: Progressing   Problem: Respiratory: Goal: Verbalizations of increased ease of respirations will increase 09/25/2022 1055 by Rogelia Rohrer, LPN Outcome: Adequate for Discharge 09/25/2022 1055 by Rogelia Rohrer, LPN Outcome: Progressing   Problem: Role Relationship: Goal: Family's ability to cope with current situation will improve 09/25/2022 1055 by Rogelia Rohrer, LPN Outcome: Adequate for Discharge 09/25/2022 1055 by Rogelia Rohrer, LPN Outcome: Progressing Goal: Ability to verbalize concerns, feelings, and thoughts to  partner or family member will improve 09/25/2022 1055 by Rogelia Rohrer, LPN Outcome: Adequate for Discharge 09/25/2022 1055 by Rogelia Rohrer, LPN Outcome: Progressing   Problem: Pain Management: Goal: Satisfaction with pain management regimen will improve 09/25/2022 1055 by Rogelia Rohrer, LPN Outcome: Adequate for Discharge 09/25/2022 1055 by Rogelia Rohrer, LPN Outcome: Progressing

## 2022-09-25 NOTE — TOC Progression Note (Addendum)
Transition of Care [facility]) - Progression Note    Patient Details  Name: Ernest [name] MRN: [id number] Date of Birth: Apr 03, 1946  [facility] CM/SW [place], LCSW Phone Number: 09/25/2022, 10:46 AM  Clinical Narrative:   [facility] [employer] liaison said she spoke to wife this morning. Wife is planning on asking church friends to help her care for patient at home.  12:11 pm: Wife has not received DME delivery time yet. She will call CSW with update. She is still working on having someone help her care for him. She has called several people but they tell her they have to call her back.  2:54 pm: Wife still have not found anyone to help her. She is okay with paying privately for caregivers. [employer] is aware and will call her.  Expected Discharge Plan: Skilled Nursing Facility Barriers to Discharge: Insurance Authorization  Expected Discharge Plan and [place] arrangements for the past 2 months: Single Family Home Expected Discharge Date: 09/25/22                                     Social Determinants of Health (SDOH) Interventions SDOH Screenings   Food Insecurity: No Food Insecurity (09/08/2022)  Housing: Low Risk  (09/08/2022)  Transportation Needs: No Transportation Needs (09/08/2022)  Utilities: Not At Risk (09/08/2022)  Alcohol Screen: Low Risk  (06/21/2021)  Depression (PHQ2-9): Low Risk  (08/25/2022)  Financial Resource Strain: Low Risk  (06/21/2021)  Physical Activity: Inactive (06/21/2021)  Social Connections: Unknown (06/21/2021)  Stress: No Stress Concern Present (06/21/2021)  Tobacco Use: Low Risk  (09/11/2022)    Readmission Risk Interventions     No data to display

## 2022-09-25 NOTE — Care Management Important Message (Signed)
Important Message  Patient Details  Name: Ernest [name] MRN: [id number] Date of Birth: Oct 28, 1945   Medicare Important Message Given:  Other (see comment)  Disposition to discharge with hospice services. Medicare IM withheld at this time out of respect for patient and family.    Dannette Barbara 09/25/2022, 1:04 PM

## 2022-09-26 DIAGNOSIS — I951 Orthostatic hypotension: Secondary | ICD-10-CM | POA: Diagnosis not present

## 2022-09-26 MED ORDER — PROCHLORPERAZINE EDISYLATE 10 MG/2ML IJ SOLN
10.0000 mg | Freq: Once | INTRAMUSCULAR | Status: AC
  Administered 2022-09-26: 10 mg via INTRAVENOUS
  Filled 2022-09-26: qty 2

## 2022-10-09 ENCOUNTER — Encounter: Payer: Medicare Other | Admitting: Cardiology

## 2022-10-10 ENCOUNTER — Ambulatory Visit: Payer: Medicare Other | Admitting: Family Medicine

## 2022-10-22 NOTE — Progress Notes (Signed)
   10/04/2022 0700  Spiritual Encounters  Type of Visit Initial  Care provided to: Pt and family  Conversation partners present during encounter Nurse  Referral source Nurse (RN/NT/LPN)  Reason for visit Patient death  OnCall Visit Yes  Spiritual Framework  Presenting Themes Significant life change;Impactful experiences and emotions;Courage hope and growth  Community/Connection Family;Spiritual leader  Family Stress Factors Loss  Interventions  Spiritual Care Interventions Made Narrative/life review;Normalization of emotions;Compassionate presence;Established relationship of care and support  Intervention Outcomes  Outcomes Reduced isolation;Awareness of support;Connection to spiritual care  Spiritual Care Plan  Spiritual Care Issues Still Outstanding No further spiritual care needs at this time (see row info)   Responded to page, this Ernest [name] visited the wife of the deceased at bedside. Ms Ernest [name] narrated her last experience with her  husband. Tearful but also with hope and gratitude.She say "He was ready to go..he had made peace with [place] and his God."  Ernest [name] held time and space with family as well as prayed as requested.

## 2022-10-22 NOTE — Progress Notes (Signed)
   October 04, 2022 0440  Attending Physican Contact  Attending Physician Notified Y  Attending Physician (First and Last Name) Enedina Finner, MD  Post Mortem Checklist  Date of Death 04-Oct-2022  Time of Death 0420  Pronounced By Erie Noe, RN  Next of kin notified Yes  Name of next of kin notified of death Will Chavez  Contact Person's Relationship to Patient Spouse  Contact Person's Phone Number [phone]  Contact Person's address [address], [place], [address]  Was the patient a No Code Blue or a Limited Code Blue? Yes  Did the patient die unattended? No  Patient restrained? Not applicable  Height 6\' 1"  (1.854 m)  Weight 129.6 kg  [employer] (previously known as Administrator, sports)  Notification Date 10-04-2022  Notification Time 0500  [employer] Number [phone] ; ID# [id number]  Is patient a potential donor? N  Autopsy  Autopsy requested by MD or Family ( Non ME Case) N/A  Patient and Hospital Property Returned  Patient is satisfied that all belongings have been returned? Yes  Name of person receiving valuables? Marinell Blight  Specify valuables returned Clothing.  Medical Examiner  Is this a medical examiner's case? [facility] home name/address/phone # [facility] - [address] [place] [phone]  Planned location of pickup [place]

## 2022-10-22 NOTE — Death Summary Note (Signed)
DEATH SUMMARY   Patient Details  Name: Ernest [name] MRN: [id number] DOB: September 02, 1945 PCP:Pcp, No Admission/Discharge Information   Admit Date:  2022-10-01  Date of Death: Date of Death: October 19, 2022  Time of Death: Time of Death: 0420  Length of Stay: 11/01/2022   Principle Cause of death: diastolic congestive heart failure orthostatic hypotension suspected autonomic dysfunction  Hospital Diagnoses: Principal Problem:   Orthostatic hypotension Active Problems:   Cardiogenic shock   Chronic diastolic CHF (congestive heart failure)   Persistent atrial fibrillation   Chronic respiratory failure with hypoxia   Leukocytosis   Obesity, Class III, BMI 40-49.9 (morbid obesity)   Obesity hypoventilation syndrome   AKI (acute kidney injury)   Atrial fibrillation   Autonomic dysfunction   Acute on chronic congestive heart failure   Hospital Course: 77 year old with chronic respiratory failure on 4 L home O2, A-fib on amiodarone and Eliquis s/p cardioversion, chronic hypotension likely secondary to autonomic dysfunction on midodrine, obesity via hypoventilation syndrome was admitted for decompensated HFpEF. He has diuresed well and feels better.    Orthostatic hypotension suspected Autonomic dysfunction Most likely autonomic dysfunction.  Patient also has severe hypoalbuminemia which can be contributory. -TED hose and abdominal binder - cardiology thinks Most likely autonomic dysfunction, advises tertiary care center dysautonomia clinic f/u. - requests outpatient palliative care referral --pt is requesting hospice at his home   Chronic diastolic CHF (congestive heart failure) [facility]) Patient admitted with concern of acute on chronic diastolic heart failure and volume overload.  Right heart cath with low filling pressures-none on 10-03-2022 Heart failure team signed off.   Persistent atrial fibrillation (HCC) -Continue Eliquis   Chronic respiratory failure with hypoxia (HCC) Initial  concern of acute on chronic respiratory failure secondary to fluid overload, now resolved and patient is back to baseline.    Obesity hypoventilation syndrome (HCC) Obesity, Class III, BMI 40-49.9 (morbid obesity) (HCC) Estimated body mass index is 37.78 kg/m as calculated from the following:   Height as of this encounter: 6\' 1"  (1.854 m).   Weight as of this encounter: 129.9 kg.  -This complicates overall prognosis -Continue supplemental oxygen   AKI (acute kidney injury) (HCC) Resolved.   Exocrine pancreatic deficiency Possible SIBO At gi visit last year noted to have exocrine pancreatic deficiency,  on creon    Generalized weakness -- pt has expressed wishes to go home with hospice.  -- Palliative care consultation appreciated--pt is COMFORT CARE now  Patient was switch to comfort care. He had a long discussion with palliative care. Patient passed away on 04/28/24at 4:20 AM.  Significant Diagnostic Studies: CARDIAC CATHETERIZATION  Result Date: 10/03/2022 HEMODYNAMICS: RA:   3 mmHg (mean) RV:   13/1-3 mmHg PA:   15/5 mmHg (10 mean) PCWP:  4 mmHg (mean)    Estimated Fick CO/CI   5.6L/min, 2.2 L/min/m2    TPG    6  mmHg     PVR     1.1 Wood Units PAPi      3.3  IMPRESSION: Low pre and post capillary filling pressures Low normal cardiac output / cardiac index Normal PVR Aditya Sabharwal Advanced Heart Failure 9:32 AM   DG Chest Port 1 View  Result Date: Oct 01, 2022 CLINICAL DATA:  Shortness of breath EXAM: PORTABLE CHEST 1 VIEW COMPARISON:  X-ray 08/17/2022 FINDINGS: Enlarged cardiopericardial silhouette with calcified and tortuous aorta. Central vascular congestion. Tiny pleural effusions. No pneumothorax or edema. Overlapping cardiac leads. Curvature and degenerative changes along the spine. Underinflation.  Film is under penetrated IMPRESSION: Enlarged cardiopericardial silhouette with vascular congestion and tiny effusions. The inferior right costophrenic angle is clipped off the  edge of the film Electronically Signed   By: Karen Kays M.D.   On: 09/14/2022 12:30     Time spent: 25 minutes  Signed: Enedina Finner, MD 09/27/2022

## 2022-10-22 DEATH — deceased

## 2022-12-02 ENCOUNTER — Ambulatory Visit: Payer: TRICARE For Life (TFL) | Admitting: Family Medicine

## 2023-03-06 ENCOUNTER — Ambulatory Visit: Payer: Medicare Other | Admitting: Internal Medicine

## 2023-03-06 ENCOUNTER — Other Ambulatory Visit: Payer: Medicare Other

## 2023-03-06 ENCOUNTER — Ambulatory Visit: Payer: Medicare Other
